# Patient Record
Sex: Female | Born: 1935
Health system: Southern US, Community
[De-identification: ages and names within clinical notes are randomized; demographics above are authoritative.]

## PROBLEM LIST (undated history)

## (undated) DIAGNOSIS — E785 Hyperlipidemia, unspecified: Secondary | ICD-10-CM

## (undated) DIAGNOSIS — J309 Allergic rhinitis, unspecified: Secondary | ICD-10-CM

## (undated) DIAGNOSIS — R252 Cramp and spasm: Secondary | ICD-10-CM

## (undated) DIAGNOSIS — M069 Rheumatoid arthritis, unspecified: Secondary | ICD-10-CM

## (undated) DIAGNOSIS — K219 Gastro-esophageal reflux disease without esophagitis: Secondary | ICD-10-CM

## (undated) DIAGNOSIS — N3941 Urge incontinence: Secondary | ICD-10-CM

## (undated) DIAGNOSIS — H04123 Dry eye syndrome of bilateral lacrimal glands: Secondary | ICD-10-CM

## (undated) DIAGNOSIS — H3530 Unspecified macular degeneration: Secondary | ICD-10-CM

## (undated) DIAGNOSIS — R7303 Prediabetes: Secondary | ICD-10-CM

## (undated) DIAGNOSIS — Z923 Personal history of irradiation: Secondary | ICD-10-CM

## (undated) DIAGNOSIS — M8580 Other specified disorders of bone density and structure, unspecified site: Secondary | ICD-10-CM

## (undated) DIAGNOSIS — R32 Unspecified urinary incontinence: Secondary | ICD-10-CM

## (undated) DIAGNOSIS — K5790 Diverticulosis of intestine, part unspecified, without perforation or abscess without bleeding: Secondary | ICD-10-CM

## (undated) DIAGNOSIS — M1990 Unspecified osteoarthritis, unspecified site: Secondary | ICD-10-CM

## (undated) DIAGNOSIS — N3281 Overactive bladder: Secondary | ICD-10-CM

## (undated) DIAGNOSIS — Z87898 Personal history of other specified conditions: Secondary | ICD-10-CM

## (undated) DIAGNOSIS — R5381 Other malaise: Secondary | ICD-10-CM

## (undated) DIAGNOSIS — I10 Essential (primary) hypertension: Secondary | ICD-10-CM

## (undated) DIAGNOSIS — C50919 Malignant neoplasm of unspecified site of unspecified female breast: Secondary | ICD-10-CM

## (undated) DIAGNOSIS — H9193 Unspecified hearing loss, bilateral: Secondary | ICD-10-CM

## (undated) DIAGNOSIS — H18511 Endothelial corneal dystrophy, right eye: Secondary | ICD-10-CM

## (undated) DIAGNOSIS — R9389 Abnormal findings on diagnostic imaging of other specified body structures: Secondary | ICD-10-CM

## (undated) DIAGNOSIS — R5383 Other fatigue: Secondary | ICD-10-CM

## (undated) DIAGNOSIS — K5730 Diverticulosis of large intestine without perforation or abscess without bleeding: Secondary | ICD-10-CM

## (undated) DIAGNOSIS — R040 Epistaxis: Secondary | ICD-10-CM

## (undated) DIAGNOSIS — N840 Polyp of corpus uteri: Secondary | ICD-10-CM

## (undated) DIAGNOSIS — D649 Anemia, unspecified: Secondary | ICD-10-CM

## (undated) DIAGNOSIS — Z973 Presence of spectacles and contact lenses: Secondary | ICD-10-CM

## (undated) DIAGNOSIS — S72009A Fracture of unspecified part of neck of unspecified femur, initial encounter for closed fracture: Secondary | ICD-10-CM

## (undated) HISTORY — DX: Rheumatoid arthritis, unspecified: M06.9

## (undated) HISTORY — PX: TONSILLECTOMY: SUR1361

## (undated) HISTORY — DX: Other malaise: R53.81

## (undated) HISTORY — DX: Hyperlipidemia, unspecified: E78.5

## (undated) HISTORY — DX: Diverticulosis of intestine, part unspecified, without perforation or abscess without bleeding: K57.90

## (undated) HISTORY — DX: Epistaxis: R04.0

## (undated) HISTORY — DX: Malignant neoplasm of unspecified site of unspecified female breast: C50.919

## (undated) HISTORY — DX: Other fatigue: R53.83

## (undated) HISTORY — DX: Unspecified urinary incontinence: R32

## (undated) HISTORY — DX: Cramp and spasm: R25.2

## (undated) HISTORY — DX: Gastro-esophageal reflux disease without esophagitis: K21.9

## (undated) HISTORY — DX: Essential (primary) hypertension: I10

## (undated) HISTORY — DX: Allergic rhinitis, unspecified: J30.9

## (undated) HISTORY — DX: Other specified disorders of bone density and structure, unspecified site: M85.80

## (undated) HISTORY — PX: MOLE REMOVAL: SHX2046

## (undated) HISTORY — DX: Fracture of unspecified part of neck of unspecified femur, initial encounter for closed fracture: S72.009A

---

## 1960-01-26 HISTORY — PX: TONSILLECTOMY: SUR1361

## 1997-08-09 ENCOUNTER — Other Ambulatory Visit: Admission: RE | Admit: 1997-08-09 | Discharge: 1997-08-09 | Payer: Self-pay | Admitting: *Deleted

## 1997-08-11 ENCOUNTER — Other Ambulatory Visit: Admission: RE | Admit: 1997-08-11 | Discharge: 1997-08-11 | Payer: Self-pay | Admitting: *Deleted

## 1998-08-19 ENCOUNTER — Other Ambulatory Visit: Admission: RE | Admit: 1998-08-19 | Discharge: 1998-08-19 | Payer: Self-pay | Admitting: *Deleted

## 1999-07-06 ENCOUNTER — Encounter: Admission: RE | Admit: 1999-07-06 | Discharge: 1999-07-06 | Payer: Self-pay | Admitting: *Deleted

## 1999-08-27 ENCOUNTER — Other Ambulatory Visit: Admission: RE | Admit: 1999-08-27 | Discharge: 1999-08-27 | Payer: Self-pay | Admitting: *Deleted

## 2000-09-01 ENCOUNTER — Other Ambulatory Visit: Admission: RE | Admit: 2000-09-01 | Discharge: 2000-09-01 | Payer: Self-pay | Admitting: *Deleted

## 2001-01-25 HISTORY — PX: COLONOSCOPY: SHX174

## 2001-09-05 ENCOUNTER — Other Ambulatory Visit: Admission: RE | Admit: 2001-09-05 | Discharge: 2001-09-05 | Payer: Self-pay | Admitting: *Deleted

## 2002-01-29 ENCOUNTER — Ambulatory Visit (HOSPITAL_COMMUNITY): Admission: RE | Admit: 2002-01-29 | Discharge: 2002-01-29 | Payer: Self-pay | Admitting: Gastroenterology

## 2002-09-11 ENCOUNTER — Other Ambulatory Visit: Admission: RE | Admit: 2002-09-11 | Discharge: 2002-09-11 | Payer: Self-pay | Admitting: *Deleted

## 2003-11-22 ENCOUNTER — Ambulatory Visit: Payer: Self-pay | Admitting: Family Medicine

## 2003-12-13 ENCOUNTER — Ambulatory Visit: Payer: Self-pay | Admitting: Family Medicine

## 2004-01-08 ENCOUNTER — Ambulatory Visit: Payer: Self-pay | Admitting: Family Medicine

## 2004-01-26 HISTORY — PX: CYSTOSCOPY: SUR368

## 2004-02-14 ENCOUNTER — Ambulatory Visit: Payer: Self-pay | Admitting: Family Medicine

## 2004-03-16 ENCOUNTER — Ambulatory Visit: Payer: Self-pay | Admitting: Family Medicine

## 2004-05-25 ENCOUNTER — Ambulatory Visit: Payer: Self-pay | Admitting: Family Medicine

## 2004-07-21 ENCOUNTER — Ambulatory Visit: Payer: Self-pay | Admitting: Family Medicine

## 2004-10-21 ENCOUNTER — Ambulatory Visit: Payer: Self-pay | Admitting: Family Medicine

## 2005-01-27 ENCOUNTER — Ambulatory Visit: Payer: Self-pay | Admitting: Family Medicine

## 2005-02-26 ENCOUNTER — Ambulatory Visit: Payer: Self-pay | Admitting: Family Medicine

## 2005-03-18 ENCOUNTER — Ambulatory Visit: Payer: Self-pay | Admitting: Family Medicine

## 2005-05-27 ENCOUNTER — Ambulatory Visit: Payer: Self-pay | Admitting: Family Medicine

## 2006-01-25 HISTORY — PX: CATARACT EXTRACTION W/ INTRAOCULAR LENS IMPLANT: SHX1309

## 2006-04-21 ENCOUNTER — Ambulatory Visit: Payer: Self-pay | Admitting: Family Medicine

## 2006-10-18 ENCOUNTER — Encounter: Payer: Self-pay | Admitting: Family Medicine

## 2006-11-07 ENCOUNTER — Encounter (INDEPENDENT_AMBULATORY_CARE_PROVIDER_SITE_OTHER): Payer: Self-pay | Admitting: *Deleted

## 2006-12-26 ENCOUNTER — Ambulatory Visit: Payer: Self-pay | Admitting: *Deleted

## 2006-12-26 DIAGNOSIS — I10 Essential (primary) hypertension: Secondary | ICD-10-CM | POA: Insufficient documentation

## 2006-12-26 DIAGNOSIS — K219 Gastro-esophageal reflux disease without esophagitis: Secondary | ICD-10-CM

## 2006-12-26 DIAGNOSIS — M069 Rheumatoid arthritis, unspecified: Secondary | ICD-10-CM | POA: Insufficient documentation

## 2006-12-26 DIAGNOSIS — M810 Age-related osteoporosis without current pathological fracture: Secondary | ICD-10-CM

## 2006-12-26 DIAGNOSIS — R32 Unspecified urinary incontinence: Secondary | ICD-10-CM

## 2007-05-08 ENCOUNTER — Encounter: Payer: Self-pay | Admitting: Family Medicine

## 2007-06-14 ENCOUNTER — Encounter: Payer: Self-pay | Admitting: Family Medicine

## 2007-08-08 ENCOUNTER — Encounter: Payer: Self-pay | Admitting: Family Medicine

## 2007-08-08 ENCOUNTER — Telehealth: Payer: Self-pay | Admitting: Family Medicine

## 2007-08-09 ENCOUNTER — Telehealth (INDEPENDENT_AMBULATORY_CARE_PROVIDER_SITE_OTHER): Payer: Self-pay | Admitting: *Deleted

## 2008-03-29 ENCOUNTER — Ambulatory Visit: Payer: Self-pay | Admitting: Family Medicine

## 2008-03-29 DIAGNOSIS — J309 Allergic rhinitis, unspecified: Secondary | ICD-10-CM | POA: Insufficient documentation

## 2008-04-02 LAB — CONVERTED CEMR LAB
ALT: 21 units/L (ref 0–35)
AST: 19 units/L (ref 0–37)
Albumin: 4.3 g/dL (ref 3.5–5.2)
Alkaline Phosphatase: 65 units/L (ref 39–117)
BUN: 10 mg/dL (ref 6–23)
Basophils Absolute: 0 10*3/uL (ref 0.0–0.1)
Basophils Relative: 0.6 % (ref 0.0–3.0)
Bilirubin, Direct: 0.1 mg/dL (ref 0.0–0.3)
CO2: 32 meq/L (ref 19–32)
Calcium: 9.8 mg/dL (ref 8.4–10.5)
Chloride: 100 meq/L (ref 96–112)
Cholesterol: 227 mg/dL (ref 0–200)
Creatinine, Ser: 0.7 mg/dL (ref 0.4–1.2)
Direct LDL: 144.3 mg/dL
Eosinophils Absolute: 0.1 10*3/uL (ref 0.0–0.7)
Eosinophils Relative: 1.4 % (ref 0.0–5.0)
GFR calc Af Amer: 106 mL/min
GFR calc non Af Amer: 87 mL/min
Glucose, Bld: 95 mg/dL (ref 70–99)
HCT: 40.5 % (ref 36.0–46.0)
HDL: 42.3 mg/dL (ref >39.0)
Hemoglobin: 13.6 g/dL (ref 12.0–15.0)
Lymphocytes Relative: 37.6 % (ref 12.0–46.0)
MCHC: 33.5 g/dL (ref 30.0–36.0)
MCV: 85.1 fL (ref 78.0–100.0)
Monocytes Absolute: 0.3 10*3/uL (ref 0.1–1.0)
Monocytes Relative: 5.9 % (ref 3.0–12.0)
Neutro Abs: 3 10*3/uL (ref 1.4–7.7)
Neutrophils Relative %: 54.5 % (ref 43.0–77.0)
Phosphorus: 4.3 mg/dL (ref 2.3–4.6)
Platelets: 252 10*3/uL (ref 150–400)
Potassium: 3.6 meq/L (ref 3.5–5.1)
RBC: 4.76 M/uL (ref 3.87–5.11)
RDW: 13 % (ref 11.5–14.6)
Sodium: 140 meq/L (ref 135–145)
TSH: 1.64 microintl units/mL (ref 0.35–5.50)
Total Bilirubin: 1 mg/dL (ref 0.3–1.2)
Total CHOL/HDL Ratio: 5.4
Total Protein: 7.2 g/dL (ref 6.0–8.3)
Triglycerides: 132 mg/dL (ref 0–149)
VLDL: 26 mg/dL (ref 0–40)
WBC: 5.5 10*3/uL (ref 4.5–10.5)

## 2008-05-22 ENCOUNTER — Telehealth: Payer: Self-pay | Admitting: Family Medicine

## 2008-06-11 ENCOUNTER — Ambulatory Visit: Payer: Self-pay | Admitting: Family Medicine

## 2008-06-11 DIAGNOSIS — E785 Hyperlipidemia, unspecified: Secondary | ICD-10-CM

## 2008-06-11 LAB — CONVERTED CEMR LAB
Cholesterol, target level: 200 mg/dL
HDL goal, serum: 40 mg/dL
LDL Goal: 130 mg/dL

## 2008-06-12 ENCOUNTER — Telehealth (INDEPENDENT_AMBULATORY_CARE_PROVIDER_SITE_OTHER): Payer: Self-pay | Admitting: *Deleted

## 2008-07-31 ENCOUNTER — Telehealth: Payer: Self-pay | Admitting: Family Medicine

## 2008-07-31 ENCOUNTER — Encounter: Payer: Self-pay | Admitting: Family Medicine

## 2009-01-25 HISTORY — PX: BREAST BIOPSY: SHX20

## 2009-02-25 HISTORY — PX: BREAST BIOPSY: SHX20

## 2009-03-10 ENCOUNTER — Encounter: Payer: Self-pay | Admitting: Family Medicine

## 2009-03-11 ENCOUNTER — Encounter: Payer: Self-pay | Admitting: Family Medicine

## 2009-03-13 ENCOUNTER — Telehealth: Payer: Self-pay | Admitting: Family Medicine

## 2009-03-17 ENCOUNTER — Encounter: Payer: Self-pay | Admitting: Family Medicine

## 2009-04-28 ENCOUNTER — Telehealth: Payer: Self-pay | Admitting: Family Medicine

## 2009-05-27 ENCOUNTER — Ambulatory Visit: Payer: Self-pay | Admitting: Family Medicine

## 2009-05-27 DIAGNOSIS — R252 Cramp and spasm: Secondary | ICD-10-CM | POA: Insufficient documentation

## 2009-05-27 DIAGNOSIS — R5383 Other fatigue: Secondary | ICD-10-CM

## 2009-05-27 DIAGNOSIS — R5381 Other malaise: Secondary | ICD-10-CM

## 2009-05-27 DIAGNOSIS — R209 Unspecified disturbances of skin sensation: Secondary | ICD-10-CM

## 2009-05-28 LAB — CONVERTED CEMR LAB
ALT: 17 units/L (ref 0–35)
AST: 19 units/L (ref 0–37)
Albumin: 4.2 g/dL (ref 3.5–5.2)
BUN: 11 mg/dL (ref 6–23)
Basophils Absolute: 0.1 10*3/uL (ref 0.0–0.1)
Basophils Relative: 0.8 % (ref 0.0–3.0)
CO2: 32 meq/L (ref 19–32)
Calcium: 9.7 mg/dL (ref 8.4–10.5)
Chloride: 102 meq/L (ref 96–112)
Cholesterol: 203 mg/dL — ABNORMAL HIGH (ref 0–200)
Creatinine, Ser: 0.7 mg/dL (ref 0.4–1.2)
Direct LDL: 121.7 mg/dL
Eosinophils Absolute: 0.1 10*3/uL (ref 0.0–0.7)
Eosinophils Relative: 1.7 % (ref 0.0–5.0)
Folate: >20 ng/mL
GFR calc non Af Amer: 87.08 mL/min (ref >60)
Glucose, Bld: 89 mg/dL (ref 70–99)
HCT: 38.7 % (ref 36.0–46.0)
HDL: 42.3 mg/dL (ref >39.00)
Hemoglobin: 13.1 g/dL (ref 12.0–15.0)
Lymphocytes Relative: 34.6 % (ref 12.0–46.0)
Lymphs Abs: 2 10*3/uL (ref 0.7–4.0)
MCHC: 33.9 g/dL (ref 30.0–36.0)
MCV: 85.5 fL (ref 78.0–100.0)
Monocytes Absolute: 0.3 10*3/uL (ref 0.1–1.0)
Monocytes Relative: 5.5 % (ref 3.0–12.0)
Neutro Abs: 3.4 10*3/uL (ref 1.4–7.7)
Neutrophils Relative %: 57.4 % (ref 43.0–77.0)
Phosphorus: 4.2 mg/dL (ref 2.3–4.6)
Platelets: 242 10*3/uL (ref 150.0–400.0)
Potassium: 4 meq/L (ref 3.5–5.1)
RBC: 4.53 M/uL (ref 3.87–5.11)
RDW: 13.6 % (ref 11.5–14.6)
Sed Rate: 10 mm/hr (ref 0–22)
Sodium: 142 meq/L (ref 135–145)
TSH: 1.28 microintl units/mL (ref 0.35–5.50)
Total CHOL/HDL Ratio: 5
Triglycerides: 143 mg/dL (ref 0.0–149.0)
VLDL: 28.6 mg/dL (ref 0.0–40.0)
Vit D, 25-Hydroxy: 56 ng/mL (ref 30–89)
Vitamin B-12: 901 pg/mL (ref 211–911)
WBC: 5.9 10*3/uL (ref 4.5–10.5)

## 2009-05-29 ENCOUNTER — Encounter: Admission: RE | Admit: 2009-05-29 | Discharge: 2009-05-29 | Payer: Self-pay | Admitting: Family Medicine

## 2009-06-26 ENCOUNTER — Encounter: Payer: Self-pay | Admitting: Family Medicine

## 2009-07-07 ENCOUNTER — Encounter: Payer: Self-pay | Admitting: Family Medicine

## 2009-07-16 ENCOUNTER — Encounter: Payer: Self-pay | Admitting: Family Medicine

## 2009-07-30 ENCOUNTER — Ambulatory Visit: Payer: Self-pay | Admitting: Family Medicine

## 2009-08-06 ENCOUNTER — Encounter: Admission: RE | Admit: 2009-08-06 | Discharge: 2009-08-06 | Payer: Self-pay | Admitting: Diagnostic Neuroimaging

## 2009-08-06 ENCOUNTER — Encounter: Payer: Self-pay | Admitting: Family Medicine

## 2009-08-07 ENCOUNTER — Ambulatory Visit: Payer: Self-pay | Admitting: Family Medicine

## 2009-08-25 HISTORY — PX: BREAST BIOPSY: SHX20

## 2009-09-18 ENCOUNTER — Encounter: Payer: Self-pay | Admitting: Family Medicine

## 2009-09-30 ENCOUNTER — Encounter: Payer: Self-pay | Admitting: Family Medicine

## 2010-02-04 ENCOUNTER — Encounter: Payer: Self-pay | Admitting: Family Medicine

## 2010-02-26 NOTE — Consult Note (Signed)
Summary: Dr.Vikram Penumalli,[facility]  Dr.Vikram Penumalli,[facility]   Imported By: Beau Fanny 08/13/2009 16:22:42  _____________________________________________________________________  External Attachment:    Type:   Image     Comment:   External Document

## 2010-02-26 NOTE — Assessment & Plan Note (Signed)
Summary: PROBLEMS WITH LEGS/RBH    Vital Signs:  Patient profile:   77 year old female Height:      64.5 inches Weight:      148.25 pounds BMI:     25.14 Temp:     98.3 degrees F oral Pulse rate:   80 / minute Pulse rhythm:   regular BP sitting:   132 / 84  (left arm) Cuff size:   regular  Vitals Entered By: Lewanda Rife LPN (May 28, 5782 9:37 AM) CC: cramps in lower legs at night and numbness in rt leg on and off   History of Present Illness: leg problems for about 2 weeks woke up one night - bad cramp in  Rleg -- put her leg on the floor -- then got a little numb that passed quickly  saturday -- jumped up because she felt a cramp coming - then could not move her R lleg and then numbness again  she rubbed it and it got back to normal -- then could walk around   today leg is back to normal     has always had numb feet intermittently- never got it checked out  last year - walking in mall - fell foward unexpectedly and got right up after that gait was never quite the same --? wondered why  april of last year in bed one night and got up -- could not move her L leg -- and L foot felt numb foot - could barely put wt on   fam hx - PGM had a cva  PGF with cva  no MS in family    Allergies: 1)  ! Fosamax 2)  Ace Inhibitors 3)  * Boniva  Past History:  Past Medical History: Last updated: 03/29/2008 GERD Hypertension Osteopenia Rheumatoid arthritis Urinary incontinence Epistaxis   GI - Buccini rheum- Devishwar  gyn-- Dr Dixdine   Past Surgical History: Last updated: 03/19/2009 Tonsillectomy Cystoscopy- 2006 Colonoscopy- 2003 Mole removed from rectum 5/09 colonoscopy - diverticulosis - re check 5 years mam with breast US - normal 08/17/2007 dexa 2009- osteopenia  2/11-- breast bx fibrocystic change  2/07- nl lifeline screen - carotid, AAA, PVD  Family History: Last updated: January 02, 2007 Father died age 73 of MI, CAD Mother still living- arrythmia, CAD,  HTN,anxiety Family hx of CV, HBP, Depression and second cousin with breast cancer.  Social History: Last updated: 03/29/2008 Married No biological children, has step-children. Taught HS x 30 years. non smoker  regular exercise   Risk Factors: Exercise: no (01/02/07)  Risk Factors: Smoking Status: never (01-02-2007) Passive Smoke Exposure: no (01-02-07)  Review of Systems General:  Complains of fatigue; denies chills, fever, loss of appetite, and malaise. Eyes:  Complains of blurring; occ blurred vision but not double . ENT:  Denies sinus pressure and sore throat. CV:  Denies chest pain or discomfort and palpitations. Resp:  Denies cough, pleuritic, shortness of breath, and wheezing. GI:  Denies abdominal pain, bloody stools, change in bowel habits, loss of appetite, nausea, and vomiting. GU:  Denies dysuria and urinary frequency. MS:  Complains of muscle aches, cramps, muscle weakness, and stiffness; denies joint redness and joint swelling. Derm:  Denies lesion(s), poor wound healing, and rash. Neuro:  Complains of numbness, tingling, visual disturbances, and weakness; denies headaches, inability to speak, memory loss, poor balance, seizures, and tremors. Psych:  Denies anxiety and depression. Endo:  Denies cold intolerance, excessive thirst, excessive urination, and heat intolerance. Heme:  Denies abnormal bruising and  bleeding.  Physical Exam  General:  Well-developed,well-nourished,in no acute distress; alert,appropriate and cooperative throughout examination Head:  normocephalic, atraumatic, and no abnormalities observed.  no sinus tenderness  Eyes:  vision grossly intact, pupils equal, pupils round, and pupils reactive to light.  fundi grossly wnl  Ears:  R ear normal and L ear normal.   Nose:  no nasal discharge.   Mouth:  pharynx pink and moist.   Neck:  supple with full rom and no masses or thyromegally, no JVD or carotid bruit  Chest Wall:  No deformities,  masses, or tenderness noted. Lungs:  Normal respiratory effort, chest expands symmetrically. Lungs are clear to auscultation, no crackles or wheezes. Heart:  normal rate and regular rhythm.  , no M or gallop Abdomen:  Bowel sounds positive,abdomen soft and non-tender without masses, organomegaly or hernias noted. no renal bruits  Msk:  No deformity or scoliosis noted of thoracic or lumbar spine.  no acute joint changes  Pulses:  plus one pedal pulses Extremities:  No clubbing, cyanosis, edema, or deformity noted with normal full range of motion of all joints.   Neurologic:  alert & oriented X3, cranial nerves II-XII intact, strength normal in all extremities, sensation intact to light touch, gait normal, DTRs symmetrical and normal, finger-to-nose normal, toes down bilaterally on Babinski, and Romberg negative.   Skin:  Intact without suspicious lesions or rashes Cervical Nodes:  No lymphadenopathy noted Inguinal Nodes:  No significant adenopathy Psych:  normal affect, talkative and pleasant    Impression & Recommendations:  Problem # 1:  NUMBNESS (ICD-782.0) Assessment New of leg intermittently with weakness  ? if poss MS or metabolic disorder or tia check mri brain with contrast- then make plan adv if symptoms suddenly return or persist to go to ER labs  Orders: TLB-TSH (Thyroid Stimulating Hormone) (84443-TSH) TLB-AST (SGOT) (84450-SGOT) TLB-ALT (SGPT) (84460-ALT) TLB-B12 + Folate Pnl (82746_82607-B12/FOL) T-Vitamin D (25-Hydroxy) (09811-91478) TLB-CBC Platelet - w/Differential (85025-CBCD) TLB-Sedimentation Rate (ESR) (85652-ESR) Radiology Referral (Radiology)  Problem # 2:  WEAKNESS (ICD-780.79) Assessment: New see above  Orders: TLB-Sedimentation Rate (ESR) (85652-ESR) Radiology Referral (Radiology)  Problem # 3:  LEG CRAMPS (ICD-729.82) check labs ? if rel to above - MRI ordered  Orders: TLB-TSH (Thyroid Stimulating Hormone) (84443-TSH) TLB-AST (SGOT)  (84450-SGOT) TLB-ALT (SGPT) (84460-ALT) TLB-B12 + Folate Pnl (82746_82607-B12/FOL) T-Vitamin D (25-Hydroxy) (29562-13086) TLB-CBC Platelet - w/Differential (85025-CBCD) TLB-Sedimentation Rate (ESR) (85652-ESR) Radiology Referral (Radiology)  Problem # 4:  FATIGUE (ICD-780.79) ? if rel to above lab today f/u in 2 mo  Orders: TLB-TSH (Thyroid Stimulating Hormone) (84443-TSH) TLB-AST (SGOT) (84450-SGOT) TLB-ALT (SGPT) (84460-ALT) TLB-B12 + Folate Pnl (57846_96295-M84/XLK) T-Vitamin D (25-Hydroxy) (44010-27253) TLB-CBC Platelet - w/Differential (85025-CBCD) TLB-Sedimentation Rate (ESR) (85652-ESR)  Complete Medication List: 1)  Fexofenadine Hcl 180 Mg Tabs (Fexofenadine hcl) .... One by mouth once daily 2)  Nasonex 50 Mcg/act Susp (Mometasone furoate) .... Two sprays in each nostril once a day as needed 3)  Hyzaar 50-12.5 Mg Tabs (Losartan potassium-hctz) .... 1/2 by mouth once daily 4)  Mucinex Maximum Strength 1200 Mg Xr12h-tab (Guaifenesin) .... As needed 5)  Proair Hfa 108 (90 Base) Mcg/act Aers (Albuterol sulfate) .... 2 puffs up to every 4 hours as needed wheezing/tight chest 6)  Tessalon 200 Mg Caps (Benzonatate) .Marland Kitchen.. 1 by mouth up to three times a day as needed cough 7)  Prilosec Otc 20 Mg Tbec (Omeprazole magnesium) .... Otc as directed.  Other Orders: Venipuncture (66440) TLB-Lipid Panel (80061-LIPID) TLB-Renal Function Panel (80069-RENAL) Specimen Handling (34742)  Patient Instructions: 1)  we will do labs today and set up MRI at check out  2)  in meantime if worse - let me know  3)  follow up with me in 2 months to disc labs   Current Allergies (reviewed today): ! FOSAMAX ACE INHIBITORS * BONIVA

## 2010-02-26 NOTE — Progress Notes (Signed)
Summary: Breast biopsy  Phone Note Outgoing Call   Call placed by: Lewanda Rife Call placed to: Patient Summary of Call: In reference to add'l mammogram views, pt went back on 03/11/09 for add'l mammogram views and ultrasound of breast. Pt is scheduled for breast biopsy on 03/17/09 by Dr Samuella Cota at [facility]. Pt appreciated your concern. This is just FYI. Thank you. Initial call taken by: Lewanda Rife LPN,  March 13, 2009 8:07 AM

## 2010-02-26 NOTE — Progress Notes (Signed)
Summary: Losartan HCTZ 50/12.5mg  refill  Phone Note Refill Request Call back at [phone] Message from:  [facility] on April 28, 2009 4:04 PM  Refills Requested: Medication #1:  HYZAAR 50-12.5 MG TABS 1/2 by mouth once daily [facility] sent refill request for Losartan HCTZ 50-12.5mg  with instructions to take one daily. Our records indicate on 06/11/2008 pt was to take 1/2 pill daily. I spoke with pt and she takes 1/2 pill daily. Pt said BP usually is 120/68 and she feels fine, no dizziness. Please advise.    Method Requested: Telephone to Pharmacy Initial call taken by: Lewanda Rife LPN,  April 28, 2009 4:09 PM  Follow-up for Phone Call        please change pharmacy directions to 1/2 - that is what she should be on can call in 1 mo with 11 ref if needed  Follow-up by: Judith Part MD,  April 28, 2009 4:15 PM  Additional Follow-up for Phone Call Additional follow up Details #1::        [facility] as instructed. New directions taking 1/2 by mouth once daily was given to pharmacist.Rena [facility] LPN  April 28, 1605 5:31 PM

## 2010-02-26 NOTE — Assessment & Plan Note (Signed)
Summary: ACU FOR SEVERE HEADACHES/JRR   Vital Signs:  Patient profile:   77 year old female Height:      64.5 inches Weight:      148 pounds BMI:     25.10 Temp:     98.4 degrees F oral Pulse rate:   92 / minute Pulse rhythm:   regular BP sitting:   128 / 70  (left arm) Cuff size:   regular  Vitals Entered By: Delilah Shan CMA Duncan Dull) (August 07, 2009 10:20 AM) CC: Nagging headache x 2 weeks, worse for the past few days.  N & V over the weekend, some night sweats.  Patient complaining with "slimey" drainage down her throat.   History of Present Illness: HA- h/o sinus pain for "years" but this episode is not responding to sudafed fully like it typically does.  This episode has been going on for several weeks.  "Aching" across bridge of nose and across face and forehead.  No F/C.  1 episode of vomiting/diarrhea last week.  Post nasal gtt per patient.  Occ cough, dry.  H/o allergy trouble.  Had been on allegra; not taking while using pseudophed.  Out of nasonex.  Had used it as needed before.    Allergies: 1)  ! Fosamax 2)  Ace Inhibitors 3)  * Boniva  Review of Systems       See HPI.  Otherwise noncontributory.    Physical Exam  General:  GEN: nad, alert and oriented HEENT: mucous membranes moist, TM w/o erythema but partially obscured by cerumen, nasal epithelium injected, OP with cobblestoning, max sinus slight tender to palpation on R NECK: supple w/o LA CV: rrr. PULM: ctab, no inc wob ABD: soft, +bs EXT: no edema    Impression & Recommendations:  Problem # 1:  ALLERGIC RHINITIS (ICD-477.9) Nontoxic.  Can likely tolerate nasal steroid.  Epistaxis unlikely with brief use.  Restart nasal steroid and use allegra.  Wean off oral OTC agents with decongestants and then off nasonex.  Benign exam o/w w/o fever.  I see no need for antibiotics at this point.  follow up as needed.  She understood.  Her updated medication list for this problem includes:    Fexofenadine Hcl 180 Mg  Tabs (Fexofenadine hcl) ..... One by mouth once daily as needed    Nasonex 50 Mcg/act Susp (Mometasone furoate) .Marland Kitchen..Marland Kitchen Two sprays in each nostril once a day as needed  Complete Medication List: 1)  Fexofenadine Hcl 180 Mg Tabs (Fexofenadine hcl) .... One by mouth once daily as needed 2)  Nasonex 50 Mcg/act Susp (Mometasone furoate) .... Two sprays in each nostril once a day as needed 3)  Hyzaar 50-12.5 Mg Tabs (Losartan potassium-hctz) .... 1/2 by mouth once daily 4)  Mucinex Maximum Strength 1200 Mg Xr12h-tab (Guaifenesin) .... As needed 5)  Prilosec Otc 20 Mg Tbec (Omeprazole magnesium) .... Otc 2 by mouth once daily 6)  Qvc Vitamins  .... Once daily  Patient Instructions: 1)  Please schedule a follow-up appointment as needed .  Use the allegra along with the nasonex and gradually wean off the pseudophed.  Take care.  Prescriptions: NASONEX 50 MCG/ACT  SUSP (MOMETASONE FUROATE) Two sprays in each nostril once a day as needed  #1 x 5   Entered and Authorized by:   Crawford Givens MD   Signed by:   Crawford Givens MD on 08/07/2009   Method used:   [facility]        [facility] [phone][facility] (retail)       [address] [place], [place]  [address]       Ph: [phone] or [phone]       Fax: [phone]   RxID:   [id number]   Current Allergies (reviewed today): ! FOSAMAX ACE INHIBITORS * BONIVA

## 2010-02-26 NOTE — Assessment & Plan Note (Signed)
Summary: 2 M F/U DISCUSS LABS/DLO   Vital Signs:  Patient profile:   77 year old female Height:      64.5 inches Weight:      148 pounds BMI:     25.10 Temp:     97.9 degrees F oral Pulse rate:   80 / minute Pulse rhythm:   regular BP sitting:   124 / 66  (left arm) Cuff size:   regular  Vitals Entered By: Lewanda Rife LPN (July 30, 1608 9:31 AM) CC: 2 month f/u to discuss labs, Lipid Management   History of Present Illness: here for f/u of visit in may with leg numbness/ pain/ cramps   Mri came back nl and was ref to neurol for symptoms  per pt - did not come up with anything  did nerve conduction tests and also carotid doppler  thinks all was ok  may be getting mra - but medicare has not approved it   neurologist - did talk to her about poss tiny stroke when she fell - wants to investigate further   symptoms are a little better less cramping if she sleeps on side instead of back  still a little numb in L foot- but better  did go to chiropractor -- and adj may have helped as well    at that time nl labs incl B12 and cbc and esr and vit D chol had also imp with LDL 121  wt is stable  bp good   some stress- ? if anxiety   indigestion in the past  was on nexium but ins did not cover  is on 20 of prilosec -- then has breakthrough symptoms     Lipid Management History:      Positive NCEP/ATP III risk factors include female age 16 years old or older and hypertension.  Negative NCEP/ATP III risk factors include no history of early menopause without estrogen hormone replacement, non-diabetic, non-tobacco-user status, no ASHD (atherosclerotic heart disease), no prior stroke/TIA, no peripheral vascular disease, and no history of aortic aneurysm.     Allergies: 1)  ! Fosamax 2)  Ace Inhibitors 3)  * Boniva  Past History:  Past Medical History: Last updated: 03/29/2008 GERD Hypertension Osteopenia Rheumatoid arthritis Urinary incontinence Epistaxis   GI -  Buccini rheum- Devishwar  gyn-- Dr Dixdine   Family History: Last updated: 2006/12/29 Father died age 37 of MI, CAD Mother still living- arrythmia, CAD, HTN,anxiety Family hx of CV, HBP, Depression and second cousin with breast cancer.  Social History: Last updated: 03/29/2008 Married No biological children, has step-children. 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 Eyes:  Denies blurring and eye irritation. CV:  Denies chest pain or discomfort, lightheadness, and palpitations. Resp:  Denies cough, shortness of breath, and wheezing. GI:  Complains of indigestion; denies abdominal pain, nausea, and vomiting. GU:  Denies urinary frequency. MS:  Complains of cramps; denies joint pain, joint redness, and joint swelling. Derm:  Denies itching, lesion(s), poor wound healing,  and rash. Neuro:  Complains of numbness and tingling. Endo:  Denies cold intolerance, excessive thirst, excessive urination, and heat intolerance. Heme:  Denies abnormal bruising and bleeding.  Physical Exam  General:  Well-developed,well-nourished,in no acute distress; alert,appropriate and cooperative throughout examination Head:  normocephalic, atraumatic, and no abnormalities observed.   Eyes:  vision grossly intact, pupils equal, pupils round, and pupils reactive to light.  no conjunctival pallor, injection or icterus  Mouth:  pharynx pink and moist.   Neck:  supple with full rom  and no masses or thyromegally, no JVD or carotid bruit  Chest Wall:  No deformities, masses, or tenderness noted. Lungs:  Normal respiratory effort, chest expands symmetrically. Lungs are clear to auscultation, no crackles or wheezes. Heart:  normal rate and regular rhythm.  , no M or gallop Abdomen:  mild epigastric tenderness without rebound or gaurding  soft, non-tender, normal bowel sounds, no hepatomegaly, and no splenomegaly.   Msk:  No deformity or scoliosis noted of thoracic or lumbar spine.   Pulses:  R and L carotid,radial,femoral,dorsalis pedis and posterior tibial pulses are full and equal bilaterally Extremities:  No clubbing, cyanosis, edema, or deformity noted with normal full range of motion of all joints.   Neurologic:  sensation intact to light touch, gait normal, and DTRs symmetrical and normal.   no acute changes on exam today Skin:  Intact without suspicious lesions or rashes Inguinal Nodes:  No significant adenopathy Psych:  normal affect, talkative and pleasant    Impression & Recommendations:  Problem # 1:  NUMBNESS (ICD-782.0) Assessment Improved ongoing workup by neurol - neg mri and carotids and ncv --  considering mra  still sympt but imp after chiropractic adj  will keep me updated  Problem # 2:  LEG CRAMPS (ICD-729.82) Assessment: Improved better not sleeping on back continue neuro w/u  Problem # 3:  GERD (ICD-530.81) Assessment: Deteriorated unfortunately cannot afford nexim  will inc prilosec permanently to 40 mg and update if any breakthrough (with good diet) Her updated medication list for this problem includes:    Prilosec Otc 20 Mg Tbec (Omeprazole magnesium) ..... Otc 2 by mouth once daily  Complete Medication List: 1)  Fexofenadine Hcl 180 Mg Tabs (Fexofenadine hcl) .... One by mouth once daily as needed 2)  Nasonex 50 Mcg/act Susp (Mometasone furoate) .... Two sprays in each nostril once a day as needed 3)  Hyzaar 50-12.5 Mg Tabs (Losartan  potassium-hctz) .... 1/2 by mouth once daily 4)  Mucinex Maximum Strength 1200 Mg Xr12h-tab (Guaifenesin) .... As needed 5)  Proair Hfa 108 (90 Base) Mcg/act Aers (Albuterol sulfate) .... 2 puffs up to every 4 hours as needed wheezing/tight chest 6)  Prilosec Otc 20 Mg Tbec (Omeprazole magnesium) .... Otc 2 by mouth once daily  Lipid Assessment/Plan:      Based on NCEP/ATP III, the patient's risk factor category is "0-1 risk factors".  The patient's lipid goals are as follows: Total cholesterol goal is 200; LDL cholesterol goal is 130; HDL cholesterol goal is 40; Triglyceride goal is 150.    Patient Instructions: 1)  continue follow up and testing from neurology  2)  update me if symptoms worsen  3)  labs are ok 4)  keep watching diet for cholesterol  5)  increase your prilosec to total 40 mg daily (2 pills)  6)  watch diet for spicy and fatty foods   Current Allergies (reviewed today): ! FOSAMAX ACE INHIBITORS * BONIVA

## 2010-02-26 NOTE — Letter (Signed)
Summary: Patient Med & Problem List/[facility]  Patient Med & Problem List/[facility]   Imported By: Lanelle Bal 07/03/2009 10:35:22  _____________________________________________________________________  External Attachment:    Type:   Image     Comment:   External Document

## 2010-03-16 ENCOUNTER — Encounter: Payer: Self-pay | Admitting: Family Medicine

## 2010-03-16 LAB — HM MAMMOGRAPHY: HM Mammogram: NORMAL

## 2010-03-18 ENCOUNTER — Encounter: Payer: Self-pay | Admitting: Family Medicine

## 2010-03-18 ENCOUNTER — Encounter (INDEPENDENT_AMBULATORY_CARE_PROVIDER_SITE_OTHER): Payer: Self-pay | Admitting: *Deleted

## 2010-03-24 NOTE — Letter (Signed)
Summary: Results Follow up Letter  [facility]  [address] [place], [place] [address]   Phone: [phone]  Fax: [phone]    03/18/2010 MRN: [id number]    [facility] [address] [place], [place]  [address]    Dear Ms. Daughdrill,  The following are the results of your recent test(s):  Test         Result    Pap Smear:        Normal _____  Not Normal _____ Comments: ______________________________________________________ Cholesterol: LDL(Bad cholesterol):         Your goal is less than:         HDL (Good cholesterol):       Your goal is more than: Comments:  ______________________________________________________ Mammogram:        Normal __X___  Not Normal _____ Comments:  Yearly follow up is recommended.   ___________________________________________________________________ Hemoccult:        Normal _____  Not normal _______ Comments:    _____________________________________________________________________ Other Tests:    We routinely do not discuss normal results over the telephone.  If you desire a copy of the results, or you have any questions about this information we can discuss them at your next office visit.   Sincerely,    Marne A. Milinda Antis, M.D.  MAT:lsf

## 2010-03-24 NOTE — Miscellaneous (Signed)
  Clinical Lists Changes  Observations: Added new observation of MAMMO DUE: 03/17/2011 (03/16/2010 8:55) Added new observation of MAMMOGRAM: Normal (03/16/2010 8:55)

## 2010-05-21 ENCOUNTER — Ambulatory Visit: Payer: Self-pay | Admitting: Unknown Physician Specialty

## 2010-06-01 ENCOUNTER — Other Ambulatory Visit: Payer: Self-pay | Admitting: Family Medicine

## 2010-06-01 NOTE — Telephone Encounter (Signed)
Pt needs to call for appt. 

## 2010-06-12 NOTE — Op Note (Signed)
   NAMERYANA, Sierra Bentley                           ACCOUNT NO.:  [id number]   MEDICAL RECORD NO.:  [id number]                   PATIENT TYPE:  AMB   LOCATION:  ENDO                                 FACILITY:  [facility]   PHYSICIAN:  Bernette Redbird, M.D.                DATE OF BIRTH:  [birth year]   DATE OF PROCEDURE:  01/29/2002  DATE OF DISCHARGE:                                 OPERATIVE REPORT   PROCEDURE PERFORMED:  Upper endoscopy.   INDICATIONS FOR PROCEDURE:  A 77 year old female with transiently hemoccult  positive stool through the office of her gynecologist on home Hemoccult  testing.  Subsequent hemoccults through my office were negative.   FINDINGS:  Possible minimal distal esophagitis.  Otherwise normal.   CONSENT:  The nature, purpose and risks of the procedure had been discussed  with the patient who provided written consent.   SEDATION:  Fentanyl 50 mcg and Versed 5 mg IV without arrhythmias or  desaturations.   DESCRIPTION OF PROCEDURE:  The Olympus video endoscope was passed under  direct vision.  The vocal cords looked normal.  The esophagus was readily  entered and was normal down to the GE junction where there was perhaps some  slight irregularity of the Z line and/or minimal esophagitis but really  nothing I could definitively call.  No significant reflux esophagitis,  extensive Barrett's esophagus, free reflux, varices, infection, neoplasia,  ring, stricture or significant hiatal hernia was identified.  The stomach  contained a small clear residual and had normal mucosa without evidence of  gastritis, erosions, ulcers, polyps or masses including retroflex view of  the cardia, and the pylorus, duodenal bulb and second duodenum looked  normal.  No biopsies were obtained.  The duodenal bulb was carefully  inspected a couple of times.  In fact, I think all areas of the upper tract  were reinspected and again with no significant findings.  The scope was then  removed  from the patient.  She tolerated the procedure well and there were  no apparent complications.  No biopsies were obtained.    IMPRESSION:  Essentially normal exam.  No source of transient hemoccult  positivity identified.   PLAN:  Proceed to colonoscopic evaluation.                                               Bernette Redbird, M.D.    RB/MEDQ  D:  01/29/2002  T:  01/29/2002  Job:  [id number]   cc:   Pershing Cox, M.D.  [address]  [place]  [place] [address]  Fax: [phone]   Julieanne Manson  [address]  [place]  [place] [address]  Fax: [phone]

## 2010-06-12 NOTE — Op Note (Signed)
NAMECOLEEN, Bentley                           ACCOUNT NO.:  [id number]   MEDICAL RECORD NO.:  [id number]                   PATIENT TYPE:  AMB   LOCATION:  ENDO                                 FACILITY:  [facility]   PHYSICIAN:  Bernette Redbird, M.D.                DATE OF BIRTH:  1933-10-05   DATE OF PROCEDURE:  01/29/2002  DATE OF DISCHARGE:                                 OPERATIVE REPORT   PROCEDURE PERFORMED:  Colonoscopy.   ENDOSCOPIST:  Florencia Reasons, M.D.   INDICATIONS FOR PROCEDURE:  The patient is a 77 year old female with  hemoccult positive stool noted on at least one outpatient Hemoccult through  her gynecologist's office, but with subsequent Hemoccults through my office  negative.   FINDINGS:  Sigmoid diverticulosis, otherwise normal to the terminal ileum.   DESCRIPTION OF PROCEDURE:  The nature, purpose and risks of the procedure  had been discussed with the patient, who provided written consent.  Sedation  for this procedure and the upper endoscopy which preceded it totaled  fentanyl 70 mcg and Versed 7 mg IV without arrhythmias or desaturation.   The Olympus adjustable tension pediatric video colonoscope was advanced to  the terminal ileum with just a little bit of external abdominal compression  to control looping.  Pullback was then performed.  The terminal ileum looked  normal.  The quality of the prep was excellent and it is felt that all areas  were well seen.   There was some sigmoid diverticulosis but this was otherwise a normal  examination.  Retroflexion in the rectum as well as reinspection of the  rectosigmoid disclosed no additional findings.  No polyps, cancer, colitis  or vascular malformations were noted.  No biopsies were obtained.  The  patient tolerated the procedure well and there were no apparent  complications.   IMPRESSION:  1. No source of heme positive stool identified.  2. Mild to moderate sigmoid and left side diverticulosis.  3.  The patient has a history of occasional rectal bleeding.  No source was     identified, so it is presumed to be of hemorrhoidal origin.  The patient     appeared to have mild internal hemorrhoids, nothing impressive.   PLAN:  Consider screening sigmoidoscopy in five years.                                                Bernette Redbird, M.D.    RB/MEDQ  D:  01/29/2002  T:  01/29/2002  Job:  [id number]   cc:   Pershing Cox, M.D.  [address]  [place]  [place] [address]  Fax: [phone]   Julieanne Manson  [address]  [place]  [place] [address]  Fax:  [phone]  

## 2010-07-29 ENCOUNTER — Other Ambulatory Visit: Payer: Self-pay | Admitting: Family Medicine

## 2010-10-30 ENCOUNTER — Other Ambulatory Visit: Payer: Self-pay | Admitting: Family Medicine

## 2010-11-27 ENCOUNTER — Encounter: Payer: Self-pay | Admitting: Family Medicine

## 2010-11-30 ENCOUNTER — Encounter: Payer: Self-pay | Admitting: Family Medicine

## 2010-11-30 ENCOUNTER — Ambulatory Visit (INDEPENDENT_AMBULATORY_CARE_PROVIDER_SITE_OTHER): Payer: Medicare Other | Admitting: Family Medicine

## 2010-11-30 VITALS — BP 104/62 | HR 76 | Temp 98.0°F | Ht 64.5 in | Wt 146.8 lb

## 2010-11-30 DIAGNOSIS — Z23 Encounter for immunization: Secondary | ICD-10-CM

## 2010-11-30 DIAGNOSIS — M899 Disorder of bone, unspecified: Secondary | ICD-10-CM

## 2010-11-30 DIAGNOSIS — I10 Essential (primary) hypertension: Secondary | ICD-10-CM

## 2010-11-30 DIAGNOSIS — E785 Hyperlipidemia, unspecified: Secondary | ICD-10-CM

## 2010-11-30 LAB — COMPREHENSIVE METABOLIC PANEL
ALT: 18 U/L (ref 0–35)
AST: 20 U/L (ref 0–37)
Albumin: 4.3 g/dL (ref 3.5–5.2)
BUN: 13 mg/dL (ref 6–23)
CO2: 30 mEq/L (ref 19–32)
Calcium: 9.5 mg/dL (ref 8.4–10.5)
Chloride: 101 mEq/L (ref 96–112)
Potassium: 4 mEq/L (ref 3.5–5.1)

## 2010-11-30 LAB — CBC WITH DIFFERENTIAL/PLATELET
Basophils Absolute: 0 10*3/uL (ref 0.0–0.1)
Eosinophils Absolute: 0.1 10*3/uL (ref 0.0–0.7)
MCHC: 33.3 g/dL (ref 30.0–36.0)
MCV: 84.8 fl (ref 78.0–100.0)
Monocytes Absolute: 0.3 10*3/uL (ref 0.1–1.0)
Neutrophils Relative %: 58.3 % (ref 43.0–77.0)
Platelets: 244 10*3/uL (ref 150.0–400.0)

## 2010-11-30 LAB — LIPID PANEL
Cholesterol: 228 mg/dL — ABNORMAL HIGH (ref 0–200)
Total CHOL/HDL Ratio: 5

## 2010-11-30 MED ORDER — LOSARTAN POTASSIUM-HCTZ 50-12.5 MG PO TABS: ORAL_TABLET | ORAL | Status: DC

## 2010-11-30 NOTE — Assessment & Plan Note (Signed)
bp in fair control at this time  No changes needed  Disc lifstyle change with low sodium diet and exercise   

## 2010-11-30 NOTE — Patient Instructions (Signed)
Pneumonia and flu vaccines today Labs today Continue calcium and vitamin D and walking for bones Avoid red meat/ fried foods/ egg yolks/ fatty breakfast meats/ butter, cheese and high fat dairy/ and shellfish  - for cholesterol  Keep up the good work

## 2010-11-30 NOTE — Progress Notes (Signed)
Subjective:    Patient ID: Sierra Bentley, female    DOB: 1933/03/15, 77 y.o.   MRN: [id number]  HPI Here for annual check up of chronic medical problems  Is feeling really good  Nothing new going on  Old cramping in legs went away   HTN in good control 104/64 No change in meds No cp or edema or ha Stays good at home  Does exercise several days per week -- walking   Wt is down 2 lb with bmi of 24  Mammogram nl in 2/12 Self exam- no lumps or changes   Pap/ gyn exam - goes to gyn [facility] Last visit last year nov -- does a pap smear --    Osteopenia 1/12-- dexa showed slt imp Is on PPI Is on ca and D Need to check D  Level  Pneumovax- was a long time ago - wants that today  Flu shot-- today  Td 07 zostavax 3/10  colonosc 5/09-- tics, recommended 5 y f/u (Dr Matthias Hughs)  No problems with hearing or vision   Need to labs today   Patient Active Problem List  Diagnoses  . HYPERLIPIDEMIA  . HYPERTENSION  . ALLERGIC RHINITIS  . GERD  . RHEUMATOID ARTHRITIS  . OSTEOPENIA  . URINARY INCONTINENCE   Past Medical History  Diagnosis Date  . GERD (gastroesophageal reflux disease)   . HTN (hypertension)   . Osteopenia   . Rheumatoid arthritis   . Urine incontinence   . Epistaxis   . Allergic rhinitis, cause unspecified   . Other malaise and fatigue   . Other and unspecified hyperlipidemia   . Cramp of limb   . Diverticulosis    Past Surgical History  Procedure Date  . Tonsillectomy   . Cystoscopy 2006  . Colonoscopy 2003  . Mole removal     rectal  . Breast biopsy 2/11    fibrocystic change  . Breast biopsy 8/11    fibrocystic change   History  Substance Use Topics  . Smoking status: Never Smoker   . Smokeless tobacco: Not on file  . Alcohol Use: Not on file   Family History  Problem Relation Age of Onset  . Heart attack Father 67  . Coronary artery disease Father   . Other Mother     Arrythmia  . Coronary artery disease Mother   . Hypertension  Mother   . Anxiety disorder Mother   . Breast cancer      2nd cousin  . Depression      family history   Allergies  Allergen Reactions  . Ace Inhibitors     REACTION: cough  . Alendronate Sodium     REACTION: reflux, trouble swallowing  . Ibandronate Sodium     REACTION: reflux and increased BP   Current Outpatient Prescriptions on File Prior to Visit  Medication Sig Dispense Refill  . fexofenadine (ALLEGRA) 180 MG tablet Take 180 mg by mouth daily as needed.        . Multiple Vitamin (MULTIVITAMIN) tablet Take 1 tablet by mouth daily.        Marland Kitchen omeprazole (PRILOSEC OTC) 20 MG tablet Take 40 mg by mouth daily.        . mometasone (NASONEX) 50 MCG/ACT nasal spray Place 1 spray into the nose daily as needed.              Review of Systems Review of Systems  Constitutional: Negative for fever, appetite change, fatigue and unexpected weight change.  Eyes: Negative for pain and visual disturbance.  Respiratory: Negative for cough and shortness of breath.   Cardiovascular: Negative for cp or palpitations    Gastrointestinal: Negative for nausea, diarrhea and constipation.  Genitourinary: Negative for urgency and frequency.  Skin: Negative for pallor or rash   Neurological: Negative for weakness, light-headedness, numbness and headaches.  Hematological: Negative for adenopathy. Does not bruise/bleed easily.  Psychiatric/Behavioral: Negative for dysphoric mood. The patient is not nervous/anxious.          Objective:   Physical Exam  Constitutional: She appears well-developed and well-nourished. No distress.  HENT:  Head: Normocephalic and atraumatic.  Right Ear: External ear normal.  Left Ear: External ear normal.  Nose: Nose normal.  Mouth/Throat: Oropharynx is clear and moist.  Eyes: Conjunctivae and EOM are normal. Pupils are equal, round, and reactive to light. No scleral icterus.  Neck: Normal range of motion. Neck supple. No JVD present. Carotid bruit is not  present. No thyromegaly present.  Cardiovascular: Normal rate, regular rhythm, normal heart sounds and intact distal pulses.  Exam reveals no gallop.   Pulmonary/Chest: Effort normal and breath sounds normal. No respiratory distress. She has no wheezes. She exhibits no tenderness.  Abdominal: Soft. Bowel sounds are normal. She exhibits no distension, no abdominal bruit and no mass. There is no tenderness.  Musculoskeletal: Normal range of motion. She exhibits no edema and no tenderness.  Lymphadenopathy:    She has no cervical adenopathy.  Neurological: She is alert. She has normal reflexes. No cranial nerve deficit. She exhibits normal muscle tone. Coordination normal.  Skin: Skin is warm and dry. No rash noted. No erythema. No pallor.  Psychiatric: She has a normal mood and affect.          Assessment & Plan:

## 2010-11-30 NOTE — Assessment & Plan Note (Signed)
With slt improvement in BMD last January Doing well with ca and D and walking  Will be due for check in another year

## 2010-11-30 NOTE — Assessment & Plan Note (Signed)
Lab Results  Component Value Date   CHOL 203* 05/27/2009   HDL 42.30 05/27/2009   LDLDIRECT 121.7 05/27/2009   TRIG 143.0 05/27/2009   CHOLHDL 5 05/27/2009   check today Disc goals for lipids and reasons to control them Rev labs with pt Rev low sat fat diet in detail

## 2010-12-01 LAB — VITAMIN D 25 HYDROXY (VIT D DEFICIENCY, FRACTURES): Vit D, 25-Hydroxy: 62 ng/mL (ref 30–89)

## 2011-01-26 HISTORY — PX: COLONOSCOPY: SHX174

## 2011-03-01 ENCOUNTER — Ambulatory Visit (INDEPENDENT_AMBULATORY_CARE_PROVIDER_SITE_OTHER): Payer: Medicare Other | Admitting: Family Medicine

## 2011-03-01 ENCOUNTER — Encounter: Payer: Self-pay | Admitting: Family Medicine

## 2011-03-01 VITALS — BP 128/76 | HR 80 | Temp 97.9°F | Ht 64.5 in | Wt 148.5 lb

## 2011-03-01 DIAGNOSIS — K219 Gastro-esophageal reflux disease without esophagitis: Secondary | ICD-10-CM

## 2011-03-01 DIAGNOSIS — E785 Hyperlipidemia, unspecified: Secondary | ICD-10-CM

## 2011-03-01 DIAGNOSIS — M899 Disorder of bone, unspecified: Secondary | ICD-10-CM

## 2011-03-01 DIAGNOSIS — R946 Abnormal results of thyroid function studies: Secondary | ICD-10-CM

## 2011-03-01 DIAGNOSIS — R9389 Abnormal findings on diagnostic imaging of other specified body structures: Secondary | ICD-10-CM | POA: Insufficient documentation

## 2011-03-01 DIAGNOSIS — M949 Disorder of cartilage, unspecified: Secondary | ICD-10-CM

## 2011-03-01 DIAGNOSIS — I10 Essential (primary) hypertension: Secondary | ICD-10-CM

## 2011-03-01 NOTE — Assessment & Plan Note (Signed)
Will need dexa upcoming next jan  Last scan improved  Will continue to follow Continue ca and D and restart exercise

## 2011-03-01 NOTE — Progress Notes (Signed)
Subjective:    Patient ID: Sierra Bentley, female    DOB: 17-May-1933, 78 y.o.   MRN: [id number]  HPI Here for f/u of hyperlipidemia and osteopenia  Also had [facility] screen -found mild carotid art dz in L and also ? Abn in thyroid  T of -1.8 for bone density  No thyroid enlargement/ pressure/pain or change in neck per pt   She has noticed more post nasal drip lately -- on allegra and no resp to claritin and nasonex  Allegra works the best   No goiter Lab Results  Component Value Date   TSH 1.63 11/30/2010     Overall feels ok -no new symptoms   Lipids LDL 142 Lab Results  Component Value Date   CHOL 228* 11/30/2010   CHOL 203* 05/27/2009   CHOL 227* 03/29/2008   Lab Results  Component Value Date   HDL 48.50 11/30/2010   HDL 56.21 05/27/2009   HDL 42.3 03/29/2008   No results found for this basename: LDLCALC   Lab Results  Component Value Date   TRIG 128.0 11/30/2010   TRIG 143.0 05/27/2009   TRIG 132 03/29/2008   Lab Results  Component Value Date   CHOLHDL 5 11/30/2010   CHOLHDL 5 05/27/2009   CHOLHDL 5.4 CALC 03/29/2008   Lab Results  Component Value Date   LDLDIRECT 155.9 11/30/2010   LDLDIRECT 121.7 05/27/2009   LDLDIRECT 144.3 03/29/2008   is really watching her diet now - last check here had been eating out and traveling too much  Eats steak too frequently , and also egg yolks   bp was up there 142/68 bp is  128/76   Today No cp or palpitations or headaches or edema  No side effects to medicines    Patient Active Problem List  Diagnoses  . HYPERLIPIDEMIA  . HYPERTENSION  . ALLERGIC RHINITIS  . GERD  . RHEUMATOID ARTHRITIS  . OSTEOPENIA  . URINARY INCONTINENCE  . Abnormal ultrasound of thyroid gland   Past Medical History  Diagnosis Date  . GERD (gastroesophageal reflux disease)   . HTN (hypertension)   . Osteopenia   . Rheumatoid arthritis   . Urine incontinence   . Epistaxis   . Allergic rhinitis, cause unspecified   . Other malaise and fatigue   . Other  and unspecified hyperlipidemia   . Cramp of limb   . Diverticulosis    Past Surgical History  Procedure Date  . Tonsillectomy   . Cystoscopy 2006  . Colonoscopy 2003  . Mole removal     rectal  . Breast biopsy 2/11    fibrocystic change  . Breast biopsy 8/11    fibrocystic change   History  Substance Use Topics  . Smoking status: Never Smoker   . Smokeless tobacco: Not on file  . Alcohol Use: Not on file   Family History  Problem Relation Age of Onset  . Heart attack Father 61  . Coronary artery disease Father   . Other Mother     Arrythmia  . Coronary artery disease Mother   . Hypertension Mother   . Anxiety disorder Mother   . Breast cancer      2nd cousin  . Depression      family history   Allergies  Allergen Reactions  . Ace Inhibitors     REACTION: cough  . Alendronate Sodium     REACTION: reflux, trouble swallowing  . Ibandronate Sodium     REACTION: reflux and increased BP  Current Outpatient Prescriptions on File Prior to Visit  Medication Sig Dispense Refill  . CycloSPORINE (RESTASIS OP) Place 2 drops into both eyes daily.        Marland Kitchen estradiol (ESTRACE) 0.1 MG/GM vaginal cream Place 2 g vaginally daily as needed.        . fexofenadine (ALLEGRA) 180 MG tablet Take 180 mg by mouth daily as needed.        Marland Kitchen losartan-hydrochlorothiazide (HYZAAR) 50-12.5 MG per tablet Take 1/2 tablet once daily by mouth  45 tablet  3  . Multiple Vitamin (MULTIVITAMIN) tablet Take 1 tablet by mouth daily.        Marland Kitchen omeprazole (PRILOSEC OTC) 20 MG tablet Take 40 mg by mouth daily.        Bertram Gala Glycol-Propyl Glycol (SYSTANE ULTRA OP) Place 1 drop into both eyes 4 (four) times daily.        . sodium chloride (MURO 128) 5 % ophthalmic ointment Place 1 drop into both eyes as needed.        . mometasone (NASONEX) 50 MCG/ACT nasal spray Place 1 spray into the nose daily as needed.           Review of Systems Review of Systems  Constitutional: Negative for fever, appetite  change, fatigue and unexpected weight change.  Eyes: Negative for pain and visual disturbance.  Respiratory: Negative for cough and shortness of breath.   Cardiovascular: Negative for cp or palpitations    Gastrointestinal: Negative for nausea, diarrhea and constipation. pos for acid reflux  Genitourinary: Negative for urgency and frequency.  Skin: Negative for pallor or rash   Neurological: Negative for weakness, light-headedness, numbness and headaches.  Hematological: Negative for adenopathy. Does not bruise/bleed easily.  Psychiatric/Behavioral: Negative for dysphoric mood. The patient is not nervous/anxious.         Objective:   Physical Exam  Constitutional: She appears well-developed and well-nourished. No distress.  HENT:  Head: Normocephalic and atraumatic.  Mouth/Throat: Oropharynx is clear and moist.  Eyes: Conjunctivae and EOM are normal. Pupils are equal, round, and reactive to light. No scleral icterus.  Neck: Normal range of motion. Neck supple. No JVD present. Carotid bruit is not present. Erythema present. No thyromegaly present.  Cardiovascular: Normal rate, regular rhythm and normal heart sounds.  Exam reveals no gallop.   Pulmonary/Chest: Effort normal and breath sounds normal. No respiratory distress. She has no wheezes.  Abdominal: Soft. Bowel sounds are normal. She exhibits no distension and no mass. There is no tenderness.  Musculoskeletal: Normal range of motion. She exhibits no edema and no tenderness.  Lymphadenopathy:    She has no cervical adenopathy.  Neurological: She is alert. She has normal reflexes. She displays no atrophy and no tremor. No cranial nerve deficit. She exhibits normal muscle tone. Coordination normal.  Skin: Skin is warm and dry. No rash noted. No erythema. No pallor.  Psychiatric: She has a normal mood and affect.          Assessment & Plan:

## 2011-03-01 NOTE — Assessment & Plan Note (Signed)
Incidental finding on her carotid doppler- ? Nodule sched thyroid US today Asymptomatic Nl tsh

## 2011-03-01 NOTE — Assessment & Plan Note (Signed)
bp in fair control at this time  No changes needed  Disc lifstyle change with low sodium diet and exercise   Better than it was at [facility]

## 2011-03-01 NOTE — Assessment & Plan Note (Signed)
Pt having symptoms- she will check bottle of prilosec to see if she is taking 40  If so - will f/u with Dr Matthias Hughs for GERD that is not well controlled

## 2011-03-01 NOTE — Assessment & Plan Note (Signed)
Improved but not at goal Disc goals for lipids and reasons to control them Rev labs with pt Rev low sat fat diet in detail Handouts given Lab 6 mo and f/u

## 2011-03-01 NOTE — Patient Instructions (Addendum)
You should be on 40 mg of prilosec a day- if this does not control acid reflux- you need to see your GI doctor  Avoid red meat/ fried foods/ egg yolks/ fatty breakfast meats/ butter, cheese and high fat dairy/ and shellfish   Schedule fasting labs in 6 months for cholesterol and then follow up

## 2011-03-05 ENCOUNTER — Ambulatory Visit
Admission: RE | Admit: 2011-03-05 | Discharge: 2011-03-05 | Disposition: A | Discharge status: Home / Self Care | Payer: Medicare Other | Source: Ambulatory Visit | Attending: Family Medicine | Admitting: Family Medicine

## 2011-03-05 DIAGNOSIS — R9389 Abnormal findings on diagnostic imaging of other specified body structures: Secondary | ICD-10-CM

## 2011-03-18 ENCOUNTER — Encounter: Payer: Self-pay | Admitting: Family Medicine

## 2011-03-23 ENCOUNTER — Encounter: Payer: Self-pay | Admitting: *Deleted

## 2011-08-12 ENCOUNTER — Telehealth: Payer: Self-pay

## 2011-08-12 NOTE — Telephone Encounter (Signed)
Pt has appt already scheduled 08/18/11 for lab and 08/31/11 for 6 mth f/u. Pt wanted to know if this would be annual exam. Pt last annual exam 11/2010. Pt said OK leave appts.

## 2011-08-18 ENCOUNTER — Other Ambulatory Visit (INDEPENDENT_AMBULATORY_CARE_PROVIDER_SITE_OTHER): Payer: Medicare Other

## 2011-08-18 DIAGNOSIS — E785 Hyperlipidemia, unspecified: Secondary | ICD-10-CM

## 2011-08-18 LAB — ALT: ALT: 16 U/L (ref 0–35)

## 2011-08-18 LAB — LIPID PANEL: Total CHOL/HDL Ratio: 4

## 2011-08-24 ENCOUNTER — Other Ambulatory Visit: Payer: Medicare Other

## 2011-08-31 ENCOUNTER — Encounter: Payer: Self-pay | Admitting: Family Medicine

## 2011-08-31 ENCOUNTER — Ambulatory Visit (INDEPENDENT_AMBULATORY_CARE_PROVIDER_SITE_OTHER): Payer: Medicare Other | Admitting: Family Medicine

## 2011-08-31 VITALS — BP 110/64 | HR 82 | Temp 98.3°F | Wt 148.0 lb

## 2011-08-31 DIAGNOSIS — E785 Hyperlipidemia, unspecified: Secondary | ICD-10-CM

## 2011-08-31 DIAGNOSIS — I10 Essential (primary) hypertension: Secondary | ICD-10-CM

## 2011-08-31 NOTE — Progress Notes (Signed)
Subjective:    Patient ID: Sierra Bentley, female    DOB: 01/09/1934, 78 y.o.   MRN: [id number]  HPI  Here for f/u of HTN and lipids   bp is     Today BP Readings from Last 3 Encounters:  08/31/11 110/64  03/01/11 128/76  11/30/10 104/62    No cp or palpitations or headaches or edema  No side effects to medicines     Wt is stable   Lab Results  Component Value Date   CHOL 209* 08/18/2011   CHOL 228* 11/30/2010   CHOL 203* 05/27/2009   Lab Results  Component Value Date   HDL 50.60 08/18/2011   HDL 40.98 11/30/2010   HDL 11.91 05/27/2009   No results found for this basename: [facility]   Lab Results  Component Value Date   TRIG 121.0 08/18/2011   TRIG 128.0 11/30/2010   TRIG 143.0 05/27/2009   Lab Results  Component Value Date   CHOLHDL 4 08/18/2011   CHOLHDL 5 11/30/2010   CHOLHDL 5 05/27/2009   Lab Results  Component Value Date   LDLDIRECT 134.1 08/18/2011   LDLDIRECT 155.9 11/30/2010   LDLDIRECT 121.7 05/27/2009    She really worked on [employer] -- watching diet  Ate a few things she should not have  Almost at goal     Lab Results  Component Value Date   ALT 16 08/18/2011   AST 21 08/18/2011   ALKPHOS 65 11/30/2010   BILITOT 0.7 11/30/2010    Patient Active Problem List  Diagnosis  . HYPERLIPIDEMIA  . HYPERTENSION  . ALLERGIC RHINITIS  . GERD  . RHEUMATOID ARTHRITIS  . OSTEOPENIA  . URINARY INCONTINENCE  . Abnormal ultrasound of thyroid gland   Past Medical History  Diagnosis Date  . GERD (gastroesophageal reflux disease)   . HTN (hypertension)   . Osteopenia   . Rheumatoid arthritis   . Urine incontinence   . Epistaxis   . Allergic rhinitis, cause unspecified   . Other malaise and fatigue   . Other and unspecified hyperlipidemia   . Cramp of limb   . Diverticulosis    Past Surgical History  Procedure Date  . Tonsillectomy   . Cystoscopy 2006  . Colonoscopy 2003  . Mole removal     rectal  . Breast biopsy 2/11    fibrocystic change  . Breast biopsy  8/11    fibrocystic change   History  Substance Use Topics  . Smoking status: Never Smoker   . Smokeless tobacco: Not on file  . Alcohol Use: Not on file   Family History  Problem Relation Age of Onset  . Heart attack Father 95  . Coronary artery disease Father   . Other Mother     Arrythmia  . Coronary artery disease Mother   . Hypertension Mother   . Anxiety disorder Mother   . Breast cancer      2nd cousin  . Depression      family history   Allergies  Allergen Reactions  . Ace Inhibitors     REACTION: cough  . Alendronate Sodium     REACTION: reflux, trouble swallowing  . Ibandronate Sodium     REACTION: reflux and increased BP   Current Outpatient Prescriptions on File Prior to Visit  Medication Sig Dispense Refill  . CycloSPORINE (RESTASIS OP) Place 2 drops into both eyes daily.        Marland Kitchen estradiol (ESTRACE) 0.1 MG/GM vaginal cream Place 2 g vaginally  daily as needed.        . fexofenadine (ALLEGRA) 180 MG tablet Take 180 mg by mouth daily as needed.        Marland Kitchen losartan-hydrochlorothiazide (HYZAAR) 50-12.5 MG per tablet Take 1/2 tablet once daily by mouth  45 tablet  3  . mometasone (NASONEX) 50 MCG/ACT nasal spray Place 1 spray into the nose daily as needed.       . Multiple Vitamin (MULTIVITAMIN) tablet Take 1 tablet by mouth daily.        Marland Kitchen omeprazole (PRILOSEC OTC) 20 MG tablet Take 40 mg by mouth daily.        Bertram Gala Glycol-Propyl Glycol (SYSTANE ULTRA OP) Place 1 drop into both eyes 4 (four) times daily.        . sodium chloride (MURO 128) 5 % ophthalmic ointment Place 1 drop into both eyes as needed.           Review of Systems Review of Systems  Constitutional: Negative for fever, appetite change, fatigue and unexpected weight change.  Eyes: Negative for pain and visual disturbance.  Respiratory: Negative for cough and shortness of breath.   Cardiovascular: Negative for cp or palpitations    Gastrointestinal: Negative for nausea, diarrhea and  constipation.  Genitourinary: Negative for urgency and frequency.  Skin: Negative for pallor or rash   Neurological: Negative for weakness, light-headedness, numbness and headaches.  Hematological: Negative for adenopathy. Does not bruise/bleed easily.  Psychiatric/Behavioral: Negative for dysphoric mood. The patient is not nervous/anxious.         Objective:   Physical Exam  Constitutional: She appears well-developed and well-nourished. No distress.  HENT:  Head: Normocephalic and atraumatic.  Mouth/Throat: Oropharynx is clear and moist.  Eyes: Conjunctivae and EOM are normal. Pupils are equal, round, and reactive to light. No scleral icterus.  Neck: Normal range of motion. Neck supple. No JVD present. Carotid bruit is not present. No thyromegaly present.  Cardiovascular: Normal rate, regular rhythm, normal heart sounds and intact distal pulses.  Exam reveals no gallop.   Pulmonary/Chest: Breath sounds normal. No respiratory distress. She has no wheezes.       No crackles   Abdominal: Soft. Bowel sounds are normal. She exhibits no distension, no abdominal bruit and no mass. There is no tenderness.  Musculoskeletal: She exhibits no edema and no tenderness.  Lymphadenopathy:    She has no cervical adenopathy.  Neurological: She is alert. She has normal reflexes. No cranial nerve deficit. She exhibits normal muscle tone. Coordination normal.  Skin: Skin is warm and dry. No rash noted. No erythema. No pallor.  Psychiatric: She has a normal mood and affect.          Assessment & Plan:

## 2011-08-31 NOTE — Assessment & Plan Note (Signed)
Quite improved with better diet Disc goals for lipids and reasons to control them Rev labs with pt Rev low sat fat diet in detail  Will continue to work on it to get LDL under 130 F/u 6 mo

## 2011-08-31 NOTE — Assessment & Plan Note (Signed)
bp in fair control at this time  No changes needed  Disc lifstyle change with low sodium diet and exercise   

## 2011-08-31 NOTE — Patient Instructions (Addendum)
Blood pressure is good Cholesterol is better- we are almost at goal  Consider support stockings for your legs Keep working on healthy diet and exercise  Follow up in 6 months for an annual exam with labs prior

## 2011-10-25 ENCOUNTER — Encounter: Payer: Self-pay | Admitting: Family Medicine

## 2011-12-12 ENCOUNTER — Other Ambulatory Visit: Payer: Self-pay | Admitting: Family Medicine

## 2011-12-16 ENCOUNTER — Ambulatory Visit (INDEPENDENT_AMBULATORY_CARE_PROVIDER_SITE_OTHER): Payer: Medicare Other

## 2011-12-16 DIAGNOSIS — Z23 Encounter for immunization: Secondary | ICD-10-CM

## 2012-02-23 ENCOUNTER — Telehealth: Payer: Self-pay | Admitting: Family Medicine

## 2012-02-23 DIAGNOSIS — K219 Gastro-esophageal reflux disease without esophagitis: Secondary | ICD-10-CM

## 2012-02-23 DIAGNOSIS — E785 Hyperlipidemia, unspecified: Secondary | ICD-10-CM

## 2012-02-23 DIAGNOSIS — I10 Essential (primary) hypertension: Secondary | ICD-10-CM

## 2012-02-23 DIAGNOSIS — M899 Disorder of bone, unspecified: Secondary | ICD-10-CM

## 2012-02-23 NOTE — Telephone Encounter (Signed)
Message copied by Judy Pimple on Wed Feb 23, 2012 10:25 AM ------      Message from: Alvina Chou      Created: Wed Feb 16, 2012  3:36 PM      Regarding: lab orders for Thursday, 1.30.14       Patient is scheduled for CPX labs, please order future labs, Thanks , Camelia Eng

## 2012-02-24 ENCOUNTER — Other Ambulatory Visit: Payer: Medicare Other

## 2012-02-25 ENCOUNTER — Other Ambulatory Visit (INDEPENDENT_AMBULATORY_CARE_PROVIDER_SITE_OTHER): Payer: Medicare Other

## 2012-02-25 DIAGNOSIS — K219 Gastro-esophageal reflux disease without esophagitis: Secondary | ICD-10-CM

## 2012-02-25 DIAGNOSIS — M899 Disorder of bone, unspecified: Secondary | ICD-10-CM

## 2012-02-25 DIAGNOSIS — I10 Essential (primary) hypertension: Secondary | ICD-10-CM

## 2012-02-25 DIAGNOSIS — E785 Hyperlipidemia, unspecified: Secondary | ICD-10-CM

## 2012-02-25 LAB — CBC WITH DIFFERENTIAL/PLATELET
Basophils Relative: 1.2 % (ref 0.0–3.0)
Eosinophils Absolute: 0.1 10*3/uL (ref 0.0–0.7)
Eosinophils Relative: 2.7 % (ref 0.0–5.0)
HCT: 40.2 % (ref 36.0–46.0)
Hemoglobin: 13.4 g/dL (ref 12.0–15.0)
Lymphs Abs: 2 10*3/uL (ref 0.7–4.0)
MCHC: 33.3 g/dL (ref 30.0–36.0)
MCV: 82.8 fl (ref 78.0–100.0)
Monocytes Absolute: 0.5 10*3/uL (ref 0.1–1.0)
Neutro Abs: 2.6 10*3/uL (ref 1.4–7.7)
Neutrophils Relative %: 49.8 % (ref 43.0–77.0)
RBC: 4.85 Mil/uL (ref 3.87–5.11)

## 2012-02-25 LAB — COMPREHENSIVE METABOLIC PANEL
AST: 18 U/L (ref 0–37)
Alkaline Phosphatase: 57 U/L (ref 39–117)
BUN: 18 mg/dL (ref 6–23)
Creatinine, Ser: 0.8 mg/dL (ref 0.4–1.2)
Glucose, Bld: 98 mg/dL (ref 70–99)
Total Bilirubin: 0.7 mg/dL (ref 0.3–1.2)

## 2012-02-25 LAB — LIPID PANEL
Cholesterol: 206 mg/dL — ABNORMAL HIGH (ref 0–200)
HDL: 41.8 mg/dL (ref >39.00)
Total CHOL/HDL Ratio: 5
Triglycerides: 109 mg/dL (ref 0.0–149.0)

## 2012-02-26 LAB — VITAMIN D 25 HYDROXY (VIT D DEFICIENCY, FRACTURES): Vit D, 25-Hydroxy: 62 ng/mL (ref 30–89)

## 2012-03-03 ENCOUNTER — Encounter: Payer: Self-pay | Admitting: Family Medicine

## 2012-03-03 ENCOUNTER — Ambulatory Visit (INDEPENDENT_AMBULATORY_CARE_PROVIDER_SITE_OTHER): Payer: Medicare Other | Admitting: Family Medicine

## 2012-03-03 VITALS — BP 108/64 | HR 77 | Temp 98.1°F | Ht 64.5 in | Wt 144.8 lb

## 2012-03-03 DIAGNOSIS — E785 Hyperlipidemia, unspecified: Secondary | ICD-10-CM

## 2012-03-03 DIAGNOSIS — I10 Essential (primary) hypertension: Secondary | ICD-10-CM

## 2012-03-03 DIAGNOSIS — M899 Disorder of bone, unspecified: Secondary | ICD-10-CM

## 2012-03-03 MED ORDER — LOSARTAN POTASSIUM-HCTZ 50-12.5 MG PO TABS: 0.5000 | ORAL_TABLET | Freq: Every day | ORAL | Status: DC

## 2012-03-03 NOTE — Assessment & Plan Note (Signed)
dexa was just done-pend report Disc imp of exercise and Ca and D  No falls or fx

## 2012-03-03 NOTE — Patient Instructions (Addendum)
For cholesterol Avoid red meat/ fried foods/ egg yolks/ fatty breakfast meats/ butter, cheese and high fat dairy/ and shellfish  Get your mammogram as planned

## 2012-03-03 NOTE — Progress Notes (Signed)
Subjective:    Patient ID: Sierra Bentley, female    DOB: December 10, 1933, 79 y.o.   MRN: [id number]  HPI Here for check up of chronic medical conditions and to review health mt list  Is doing ok overall   Her eyes are getting worse - frequent follow up - watches this closely   Wt is down 4 lb with bmi of 24 Is doing well with diet - needs to start exercise more regularly- is doing it more (walking)  Would consider yoga    mammo 2/13-has appt in march  Self exam -no changes or lumps   colonosc 9/13- up to date   Gyn- no problems at all Had visit with gyn / b exam and pap 1/14-all ok   OP Had dexa about 2 weeks ago at [facility]  Falls/ fx -none at all   Mood - is overall good - she is doing well     Hyperlipidemia Lab Results  Component Value Date   CHOL 206* 02/25/2012   CHOL 209* 08/18/2011   CHOL 228* 11/30/2010   Lab Results  Component Value Date   HDL 41.80 02/25/2012   HDL 40.98 08/18/2011   HDL 11.91 11/30/2010   No results found for this basename: [facility]   Lab Results  Component Value Date   TRIG 109.0 02/25/2012   TRIG 121.0 08/18/2011   TRIG 128.0 11/30/2010   Lab Results  Component Value Date   CHOLHDL 5 02/25/2012   CHOLHDL 4 08/18/2011   CHOLHDL 5 11/30/2010   Lab Results  Component Value Date   LDLDIRECT 130.4 02/25/2012   LDLDIRECT 134.1 08/18/2011   LDLDIRECT 155.9 11/30/2010    She does try to watch what she eats  Once in a while -- fried chicken/ not often No red meat  Patient Active Problem List  Diagnosis  . HYPERLIPIDEMIA  . HYPERTENSION  . ALLERGIC RHINITIS  . GERD  . RHEUMATOID ARTHRITIS  . OSTEOPENIA  . URINARY INCONTINENCE  . Abnormal ultrasound of thyroid gland   Past Medical History  Diagnosis Date  . GERD (gastroesophageal reflux disease)   . HTN (hypertension)   . Osteopenia   . Rheumatoid arthritis   . Urine incontinence   . Epistaxis   . Allergic rhinitis, cause unspecified   . Other malaise and fatigue   . Other and  unspecified hyperlipidemia   . Cramp of limb   . Diverticulosis    Past Surgical History  Procedure Date  . Tonsillectomy   . Cystoscopy 2006  . Colonoscopy 2003  . Mole removal     rectal  . Breast biopsy 2/11    fibrocystic change  . Breast biopsy 8/11    fibrocystic change   History  Substance Use Topics  . Smoking status: Never Smoker   . Smokeless tobacco: Not on file  . Alcohol Use: Yes   Family History  Problem Relation Age of Onset  . Heart attack Father 81  . Coronary artery disease Father   . Other Mother     Arrythmia  . Coronary artery disease Mother   . Hypertension Mother   . Anxiety disorder Mother   . Breast cancer      2nd cousin  . Depression      family history   Allergies  Allergen Reactions  . Ace Inhibitors     REACTION: cough  . Alendronate Sodium     REACTION: reflux, trouble swallowing  . Ibandronate Sodium     REACTION: reflux  and increased BP   Current Outpatient Prescriptions on File Prior to Visit  Medication Sig Dispense Refill  . CycloSPORINE (RESTASIS OP) Place 2 drops into both eyes daily.        Marland Kitchen estradiol (ESTRACE) 0.1 MG/GM vaginal cream Place 2 g vaginally daily as needed.        . fexofenadine (ALLEGRA) 180 MG tablet Take 180 mg by mouth daily as needed.        Marland Kitchen losartan-hydrochlorothiazide (HYZAAR) 50-12.5 MG per tablet TAKE 1/2 TABLET ONCE DAILY  45 tablet  0  . mometasone (NASONEX) 50 MCG/ACT nasal spray Place 1 spray into the nose daily as needed.       . Multiple Vitamin (MULTIVITAMIN) tablet Take 1 tablet by mouth daily.        Marland Kitchen omeprazole (PRILOSEC OTC) 20 MG tablet Take 40 mg by mouth daily.        Bertram Gala Glycol-Propyl Glycol (SYSTANE ULTRA OP) Place 1 drop into both eyes 4 (four) times daily.        . sodium chloride (MURO 128) 5 % ophthalmic ointment Place 1 drop into both eyes as needed.           Review of Systems Review of Systems  Constitutional: Negative for fever, appetite change, fatigue and  unexpected weight change.  Eyes: Negative for pain and visual disturbance.  Respiratory: Negative for cough and shortness of breath.   Cardiovascular: Negative for cp or palpitations    Gastrointestinal: Negative for nausea, diarrhea and constipation.  Genitourinary: Negative for urgency and frequency.  Skin: Negative for pallor or rash   Neurological: Negative for weakness, light-headedness, numbness and headaches.  Hematological: Negative for adenopathy. Does not bruise/bleed easily.  Psychiatric/Behavioral: Negative for dysphoric mood. The patient is not nervous/anxious.         Objective:   Physical Exam  Constitutional: She appears well-developed and well-nourished. No distress.  HENT:  Head: Normocephalic and atraumatic.  Right Ear: External ear normal.  Left Ear: External ear normal.  Nose: Nose normal.  Mouth/Throat: Oropharynx is clear and moist. No oropharyngeal exudate.  Eyes: Conjunctivae and EOM are normal. Pupils are equal, round, and reactive to light. Right eye exhibits no discharge. Left eye exhibits no discharge. No scleral icterus.  Neck: Normal range of motion. Neck supple. No JVD present. Carotid bruit is not present. No thyromegaly present.  Cardiovascular: Normal rate, regular rhythm, normal heart sounds and intact distal pulses.  Exam reveals no gallop.   Pulmonary/Chest: Effort normal and breath sounds normal. No respiratory distress. She has no wheezes. She exhibits no tenderness.  Abdominal: Soft. Bowel sounds are normal. She exhibits no distension, no abdominal bruit and no mass. There is no tenderness.  Musculoskeletal: Normal range of motion. She exhibits no edema and no tenderness.  Lymphadenopathy:    She has no cervical adenopathy.  Neurological: She is alert. She has normal reflexes. No cranial nerve deficit. She exhibits normal muscle tone. Coordination normal.  Skin: Skin is warm and dry. No rash noted. No erythema. No pallor.  Psychiatric: She has  a normal mood and affect.          Assessment & Plan:

## 2012-03-03 NOTE — Assessment & Plan Note (Signed)
bp in fair control at this time  No changes needed  Disc lifstyle change with low sodium diet and exercise  Med refilled Lab rev

## 2012-03-03 NOTE — Assessment & Plan Note (Signed)
Stable overall  Disc goals for lipids and reasons to control them Rev labs with pt Rev low sat fat diet in detail

## 2012-03-21 ENCOUNTER — Encounter: Payer: Self-pay | Admitting: Family Medicine

## 2012-04-14 DIAGNOSIS — H18519 Endothelial corneal dystrophy, unspecified eye: Secondary | ICD-10-CM | POA: Insufficient documentation

## 2012-04-14 DIAGNOSIS — Z961 Presence of intraocular lens: Secondary | ICD-10-CM | POA: Insufficient documentation

## 2012-04-14 DIAGNOSIS — H04129 Dry eye syndrome of unspecified lacrimal gland: Secondary | ICD-10-CM | POA: Insufficient documentation

## 2012-04-14 DIAGNOSIS — H353190 Nonexudative age-related macular degeneration, unspecified eye, stage unspecified: Secondary | ICD-10-CM | POA: Insufficient documentation

## 2012-05-23 ENCOUNTER — Encounter: Payer: Self-pay | Admitting: Family Medicine

## 2012-05-23 ENCOUNTER — Ambulatory Visit (INDEPENDENT_AMBULATORY_CARE_PROVIDER_SITE_OTHER): Payer: Medicare Other | Admitting: Family Medicine

## 2012-05-23 VITALS — BP 118/64 | HR 77 | Temp 98.0°F | Ht 64.5 in | Wt 147.5 lb

## 2012-05-23 DIAGNOSIS — L989 Disorder of the skin and subcutaneous tissue, unspecified: Secondary | ICD-10-CM | POA: Insufficient documentation

## 2012-05-23 DIAGNOSIS — I8390 Asymptomatic varicose veins of unspecified lower extremity: Secondary | ICD-10-CM | POA: Insufficient documentation

## 2012-05-23 NOTE — Assessment & Plan Note (Signed)
3-4 mm raised soft vascular lesion on anterior R ankle -for 2 months  Unsure if this is a vascular nevus of some sore or more likely extension of a varicose vein  Adv to compress it with stockings Ref to derm for eval of this and skin exam

## 2012-05-23 NOTE — Patient Instructions (Addendum)
We will do dermatology referral at check out  It may be a good idea to start wearing support stockings - to the thigh- to keep them from getting worse (when standing and sitting)  Elevate your legs whenever you can

## 2012-05-23 NOTE — Progress Notes (Signed)
Subjective:    Patient ID: Sierra Bentley, female    DOB: [birth year], 79 y.o.   MRN: [id number]  HPI Here with a spot on foot that she thinks is a blood blister  On R anterior ankle There for about 5-6 weeks Not painful/ no trauma and just not changing or going away May have started with a bruise  Not putting anything on it   Raised and smooth  It is in line with a varicose vein Used to wear supp hose when she was standing and working   Patient Active Problem List   Diagnosis Date Noted  . Abnormal ultrasound of thyroid gland 03/01/2011  . HYPERLIPIDEMIA 06/11/2008  . ALLERGIC RHINITIS 03/29/2008  . HYPERTENSION 12/26/2006  . GERD 12/26/2006  . RHEUMATOID ARTHRITIS 12/26/2006  . OSTEOPENIA 12/26/2006  . URINARY INCONTINENCE 12/26/2006   Past Medical History  Diagnosis Date  . GERD (gastroesophageal reflux disease)   . HTN (hypertension)   . Osteopenia   . Rheumatoid arthritis   . Urine incontinence   . Epistaxis   . Allergic rhinitis, cause unspecified   . Other malaise and fatigue   . Other and unspecified hyperlipidemia   . Cramp of limb   . Diverticulosis    Past Surgical History  Procedure Laterality Date  . Tonsillectomy    . Cystoscopy  2006  . Colonoscopy  2003  . Mole removal      rectal  . Breast biopsy  2/11    fibrocystic change  . Breast biopsy  8/11    fibrocystic change   History  Substance Use Topics  . Smoking status: Never Smoker   . Smokeless tobacco: Not on file  . Alcohol Use: Yes     Comment: occ   Family History  Problem Relation Age of Onset  . Heart attack Father 5  . Coronary artery disease Father   . Other Mother     Arrythmia  . Coronary artery disease Mother   . Hypertension Mother   . Anxiety disorder Mother   . Breast cancer      2nd cousin  . Depression      family history   Allergies  Allergen Reactions  . Ace Inhibitors     REACTION: cough  . Alendronate Sodium     REACTION: reflux, trouble swallowing  .  Ibandronate Sodium     REACTION: reflux and increased BP   Current Outpatient Prescriptions on File Prior to Visit  Medication Sig Dispense Refill  . CycloSPORINE (RESTASIS OP) Place 2 drops into both eyes daily.        Marland Kitchen estradiol (ESTRACE) 0.1 MG/GM vaginal cream Place 2 g vaginally daily as needed.        . fexofenadine (ALLEGRA) 180 MG tablet Take 180 mg by mouth daily as needed.        . fish oil-omega-3 fatty acids 1000 MG capsule Take 1 g by mouth daily.      Marland Kitchen losartan-hydrochlorothiazide (HYZAAR) 50-12.5 MG per tablet Take 0.5 tablets by mouth daily.  45 tablet  3  . mometasone (NASONEX) 50 MCG/ACT nasal spray Place 1 spray into the nose daily as needed.       . Multiple Vitamin (MULTIVITAMIN) tablet Take 1 tablet by mouth daily.        Marland Kitchen omeprazole (PRILOSEC OTC) 20 MG tablet Take 40 mg by mouth daily.        Bertram Gala Glycol-Propyl Glycol (SYSTANE ULTRA OP) Place 1 drop into both eyes  4 (four) times daily.        . sodium chloride (MURO 128) 5 % ophthalmic ointment Place 1 drop into both eyes as needed.         No current facility-administered medications on file prior to visit.     Review of Systems Review of Systems  Constitutional: Negative for fever, appetite change, fatigue and unexpected weight change.  Eyes: Negative for pain and visual disturbance.  Respiratory: Negative for cough and shortness of breath.   Cardiovascular: Negative for cp or palpitations    Gastrointestinal: Negative for nausea, diarrhea and constipation.  Genitourinary: Negative for urgency and frequency.  Skin: Negative for pallor or rash  neg for itching  Neurological: Negative for weakness, light-headedness, numbness and headaches.  Hematological: Negative for adenopathy. Does not bruise/bleed easily.  Psychiatric/Behavioral: Negative for dysphoric mood. The patient is not nervous/anxious.         Objective:   Physical Exam  Constitutional: She appears well-developed and well-nourished. No  distress.  HENT:  Head: Normocephalic and atraumatic.  Eyes: Conjunctivae and EOM are normal. Pupils are equal, round, and reactive to light. Right eye exhibits no discharge. Left eye exhibits no discharge.  Neck: Normal range of motion. Neck supple.  Cardiovascular: Normal rate and regular rhythm.   Musculoskeletal: She exhibits no edema and no tenderness.  Varicosities in lower legs to just above the knee bilat Compressible and nt No edema or palp cords  Lymphadenopathy:    She has no cervical adenopathy.  Neurological: She is alert. She has normal reflexes.  Skin: Skin is warm and dry. No rash noted. No pallor.  3-4 mm blue/vascular round lesion anterior R ankle- this follows the line of a varicose vein but shape is atypical for this  Blue nevus or varicosity are both in differential It is compressible and nt  Does not blanche  Psychiatric: She has a normal mood and affect.          Assessment & Plan:

## 2012-05-23 NOTE — Assessment & Plan Note (Signed)
Overall mild in feet and lower legs - suggested support hose to thigh when sitting or standing  Also elevation of legs when possible

## 2012-07-09 ENCOUNTER — Emergency Department: Payer: Self-pay | Admitting: Emergency Medicine

## 2012-11-09 ENCOUNTER — Ambulatory Visit (INDEPENDENT_AMBULATORY_CARE_PROVIDER_SITE_OTHER): Payer: Medicare Other

## 2012-11-09 DIAGNOSIS — Z23 Encounter for immunization: Secondary | ICD-10-CM

## 2013-02-25 ENCOUNTER — Telehealth: Payer: Self-pay | Admitting: Family Medicine

## 2013-02-25 DIAGNOSIS — M899 Disorder of bone, unspecified: Secondary | ICD-10-CM

## 2013-02-25 DIAGNOSIS — R9389 Abnormal findings on diagnostic imaging of other specified body structures: Secondary | ICD-10-CM

## 2013-02-25 DIAGNOSIS — M949 Disorder of cartilage, unspecified: Secondary | ICD-10-CM

## 2013-02-25 DIAGNOSIS — E785 Hyperlipidemia, unspecified: Secondary | ICD-10-CM

## 2013-02-25 DIAGNOSIS — I10 Essential (primary) hypertension: Secondary | ICD-10-CM

## 2013-02-25 NOTE — Telephone Encounter (Signed)
Message copied by Abner Greenspan on Sun Feb 25, 2013  9:23 PM ------      Message from: Ellamae Sia      Created: Tue Feb 20, 2013 12:24 PM      Regarding: Lab orders for Monday, 2.2.15       Patient is scheduled for CPX labs, please order future labs, Thanks , Terri       ------

## 2013-02-26 ENCOUNTER — Other Ambulatory Visit: Payer: Medicare Other

## 2013-03-05 ENCOUNTER — Encounter: Payer: Medicare Other | Admitting: Family Medicine

## 2013-03-07 ENCOUNTER — Encounter: Payer: Self-pay | Admitting: Family Medicine

## 2013-03-14 ENCOUNTER — Encounter: Payer: Self-pay | Admitting: *Deleted

## 2013-04-02 ENCOUNTER — Other Ambulatory Visit: Payer: Self-pay | Admitting: Family Medicine

## 2013-07-14 ENCOUNTER — Telehealth: Payer: Self-pay | Admitting: Family Medicine

## 2013-07-14 NOTE — Telephone Encounter (Signed)
Message copied by Abner Greenspan on Sat Jul 14, 2013  5:25 PM ------      Message from: Ellamae Sia      Created: Tue Jul 10, 2013  3:36 PM      Regarding: Lab orders for Wednesday, 6.24.15       Patient is scheduled for CPX labs, please order future labs, Thanks , Terri                   ------

## 2013-07-14 NOTE — Telephone Encounter (Signed)
I think there are future labs already ordered -thanks

## 2013-07-18 ENCOUNTER — Other Ambulatory Visit (INDEPENDENT_AMBULATORY_CARE_PROVIDER_SITE_OTHER): Payer: Medicare Other

## 2013-07-18 DIAGNOSIS — E785 Hyperlipidemia, unspecified: Secondary | ICD-10-CM

## 2013-07-18 DIAGNOSIS — R9389 Abnormal findings on diagnostic imaging of other specified body structures: Secondary | ICD-10-CM

## 2013-07-18 DIAGNOSIS — M899 Disorder of bone, unspecified: Secondary | ICD-10-CM

## 2013-07-18 DIAGNOSIS — R946 Abnormal results of thyroid function studies: Secondary | ICD-10-CM

## 2013-07-18 DIAGNOSIS — M949 Disorder of cartilage, unspecified: Principal | ICD-10-CM

## 2013-07-18 DIAGNOSIS — I10 Essential (primary) hypertension: Secondary | ICD-10-CM

## 2013-07-18 LAB — COMPREHENSIVE METABOLIC PANEL
ALBUMIN: 4.3 g/dL (ref 3.5–5.2)
ALK PHOS: 59 U/L (ref 39–117)
ALT: 17 U/L (ref 0–35)
AST: 20 U/L (ref 0–37)
BUN: 16 mg/dL (ref 6–23)
CO2: 29 mEq/L (ref 19–32)
CREATININE: 0.7 mg/dL (ref 0.4–1.2)
Calcium: 9.4 mg/dL (ref 8.4–10.5)
Chloride: 102 mEq/L (ref 96–112)
GFR: 83.36 mL/min (ref >60.00)
GLUCOSE: 101 mg/dL — AB (ref 70–99)
POTASSIUM: 3.6 meq/L (ref 3.5–5.1)
Sodium: 139 mEq/L (ref 135–145)
Total Bilirubin: 0.7 mg/dL (ref 0.2–1.2)
Total Protein: 7.1 g/dL (ref 6.0–8.3)

## 2013-07-18 LAB — CBC WITH DIFFERENTIAL/PLATELET
BASOS PCT: 0.8 % (ref 0.0–3.0)
Basophils Absolute: 0 10*3/uL (ref 0.0–0.1)
Eosinophils Absolute: 0.1 10*3/uL (ref 0.0–0.7)
Eosinophils Relative: 2.5 % (ref 0.0–5.0)
HEMATOCRIT: 40.1 % (ref 36.0–46.0)
Hemoglobin: 13.3 g/dL (ref 12.0–15.0)
LYMPHS ABS: 2 10*3/uL (ref 0.7–4.0)
Lymphocytes Relative: 34.1 % (ref 12.0–46.0)
MCHC: 33.3 g/dL (ref 30.0–36.0)
MCV: 84.3 fl (ref 78.0–100.0)
MONO ABS: 0.4 10*3/uL (ref 0.1–1.0)
Monocytes Relative: 6.5 % (ref 3.0–12.0)
NEUTROS ABS: 3.2 10*3/uL (ref 1.4–7.7)
NEUTROS PCT: 56.1 % (ref 43.0–77.0)
Platelets: 279 10*3/uL (ref 150.0–400.0)
RBC: 4.75 Mil/uL (ref 3.87–5.11)
RDW: 13.9 % (ref 11.5–15.5)
WBC: 5.7 10*3/uL (ref 4.0–10.5)

## 2013-07-18 LAB — TSH: TSH: 1.75 u[IU]/mL (ref 0.35–4.50)

## 2013-07-18 LAB — LIPID PANEL
CHOLESTEROL: 222 mg/dL — AB (ref 0–200)
HDL: 46.6 mg/dL (ref >39.00)
LDL Cholesterol: 154 mg/dL — ABNORMAL HIGH (ref 0–99)
NonHDL: 175.4
TRIGLYCERIDES: 109 mg/dL (ref 0.0–149.0)
Total CHOL/HDL Ratio: 5
VLDL: 21.8 mg/dL (ref 0.0–40.0)

## 2013-07-19 LAB — VITAMIN D 25 HYDROXY (VIT D DEFICIENCY, FRACTURES): Vit D, 25-Hydroxy: 79 ng/mL (ref 30–89)

## 2013-07-25 ENCOUNTER — Ambulatory Visit (INDEPENDENT_AMBULATORY_CARE_PROVIDER_SITE_OTHER): Payer: Medicare Other | Admitting: Family Medicine

## 2013-07-25 ENCOUNTER — Encounter: Payer: Self-pay | Admitting: Family Medicine

## 2013-07-25 VITALS — BP 124/74 | HR 76 | Temp 98.2°F | Ht 64.25 in | Wt 148.5 lb

## 2013-07-25 DIAGNOSIS — M949 Disorder of cartilage, unspecified: Secondary | ICD-10-CM

## 2013-07-25 DIAGNOSIS — Z0000 Encounter for general adult medical examination without abnormal findings: Secondary | ICD-10-CM

## 2013-07-25 DIAGNOSIS — E785 Hyperlipidemia, unspecified: Secondary | ICD-10-CM

## 2013-07-25 DIAGNOSIS — I10 Essential (primary) hypertension: Secondary | ICD-10-CM

## 2013-07-25 DIAGNOSIS — Z23 Encounter for immunization: Secondary | ICD-10-CM

## 2013-07-25 DIAGNOSIS — M899 Disorder of bone, unspecified: Secondary | ICD-10-CM

## 2013-07-25 MED ORDER — LOSARTAN POTASSIUM-HCTZ 50-12.5 MG PO TABS: ORAL_TABLET | ORAL | Status: DC

## 2013-07-25 NOTE — Progress Notes (Signed)
Pre visit review using our clinic review tool, if applicable. No additional management support is needed unless otherwise documented below in the visit note. 

## 2013-07-25 NOTE — Progress Notes (Signed)
Subjective:    Patient ID: Sierra Bentley, female    DOB: Jun 20, 1933, 80 y.o.   MRN: [id number]  HPI I have personally reviewed the Medicare Annual Wellness questionnaire and have noted 1. The patient's medical and social history 2. Their use of alcohol, tobacco or illicit drugs 3. Their current medications and supplements 4. The patient's functional ability including ADL's, fall risks, home safety risks and hearing or visual             impairment. 5. Diet and physical activities 6. Evidence for depression or mood disorders  The patients weight, height, BMI have been recorded in the chart and visual acuity is per eye clinic.  I have made referrals, counseling and provided education to the patient based review of the above and I have provided the pt with a written personalized care plan for preventive services.  See scanned forms.  Routine anticipatory guidance given to patient.  See health maintenance. Colon cancer screening colonosc 9/13 with no recall Breast cancer screening mammo and [place] 2/15- 1 y f/u (fibrocystic) Self breast exam-no lumps or change  Flu vaccine 10/14  Tetanus vaccine 1/07 Pneumovax 11/12 Zoster vaccine 3/10  Advance directive-does not have a living will - given packet  Cognitive function addressed- see scanned forms- and if abnormal then additional documentation follows. No major memory concerns - occ misplaces things   PMH and SH reviewed  Meds, vitals, and allergies reviewed.   ROS: See HPI.  Otherwise negative.    bp is stable today  No cp or palpitations or headaches or edema  No side effects to medicines  BP Readings from Last 3 Encounters:  07/25/13 124/74  05/23/12 118/64  03/03/12 108/64     Osteopenia -intol of alendronate adn ibandronate Last dexa 2/14 D level nl at 79 No fractures in the past year  - she is very careful (she stumbled over a cooler last summer)- is more careful , was not hurt    Results for orders placed in visit on  07/18/13  VITAMIN D 25 HYDROXY      Result Value Ref Range   Vit D, 25-Hydroxy 79  30 - 89 ng/mL  CBC WITH DIFFERENTIAL      Result Value Ref Range   WBC 5.7  4.0 - 10.5 K/uL   RBC 4.75  3.87 - 5.11 Mil/uL   Hemoglobin 13.3  12.0 - 15.0 g/dL   HCT 40.1  36.0 - 46.0 %   MCV 84.3  78.0 - 100.0 fl   MCHC 33.3  30.0 - 36.0 g/dL   RDW 13.9  11.5 - 15.5 %   Platelets 279.0  150.0 - 400.0 K/uL   Neutrophils Relative % 56.1  43.0 - 77.0 %   Lymphocytes Relative 34.1  12.0 - 46.0 %   Monocytes Relative 6.5  3.0 - 12.0 %   Eosinophils Relative 2.5  0.0 - 5.0 %   Basophils Relative 0.8  0.0 - 3.0 %   Neutro Abs 3.2  1.4 - 7.7 K/uL   Lymphs Abs 2.0  0.7 - 4.0 K/uL   Monocytes Absolute 0.4  0.1 - 1.0 K/uL   Eosinophils Absolute 0.1  0.0 - 0.7 K/uL   Basophils Absolute 0.0  0.0 - 0.1 K/uL  COMPREHENSIVE METABOLIC PANEL      Result Value Ref Range   Sodium 139  135 - 145 mEq/L   Potassium 3.6  3.5 - 5.1 mEq/L   Chloride 102  96 - 112  mEq/L   CO2 29  19 - 32 mEq/L   Glucose, Bld 101 (*) 70 - 99 mg/dL   BUN 16  6 - 23 mg/dL   Creatinine, Ser 0.7  0.4 - 1.2 mg/dL   Total Bilirubin 0.7  0.2 - 1.2 mg/dL   Alkaline Phosphatase 59  39 - 117 U/L   AST 20  0 - 37 U/L   ALT 17  0 - 35 U/L   Total Protein 7.1  6.0 - 8.3 g/dL   Albumin 4.3  3.5 - 5.2 g/dL   Calcium 9.4  8.4 - 10.5 mg/dL   GFR 83.36  >60.00 mL/min  LIPID PANEL      Result Value Ref Range   Cholesterol 222 (*) 0 - 200 mg/dL   Triglycerides 109.0  0.0 - 149.0 mg/dL   HDL 46.60  >39.00 mg/dL   VLDL 21.8  0.0 - 40.0 mg/dL   LDL Cholesterol 154 (*) 0 - 99 mg/dL   Total CHOL/HDL Ratio 5     NonHDL 175.40    TSH      Result Value Ref Range   TSH 1.75  0.35 - 4.50 uIU/mL        LDL cholesterol is higher from vacation- ate poorly for a week  Last year in 130s Is back to a better diet    Patient Active Problem List   Diagnosis Date Noted  . Encounter for Medicare annual wellness exam 07/25/2013  . Varicosities of leg  05/23/2012  . Skin lesion of right lower limb 05/23/2012  . Abnormal ultrasound of thyroid gland 03/01/2011  . HYPERLIPIDEMIA 06/11/2008  . ALLERGIC RHINITIS 03/29/2008  . HYPERTENSION 12/26/2006  . GERD 12/26/2006  . RHEUMATOID ARTHRITIS 12/26/2006  . OSTEOPENIA 12/26/2006  . URINARY INCONTINENCE 12/26/2006   Past Medical History  Diagnosis Date  . GERD (gastroesophageal reflux disease)   . HTN (hypertension)   . Osteopenia   . Rheumatoid arthritis(714.0)   . Urine incontinence   . Epistaxis   . Allergic rhinitis, cause unspecified   . Other malaise and fatigue   . Other and unspecified hyperlipidemia   . Cramp of limb   . Diverticulosis    Past Surgical History  Procedure Laterality Date  . Tonsillectomy    . Cystoscopy  2006  . Colonoscopy  2003  . Mole removal      rectal  . Breast biopsy  2/11    fibrocystic change  . Breast biopsy  8/11    fibrocystic change   History  Substance Use Topics  . Smoking status: Never Smoker   . Smokeless tobacco: Not on file  . Alcohol Use: No   Family History  Problem Relation Age of Onset  . Heart attack Father 35  . Coronary artery disease Father   . Other Mother     Arrythmia  . Coronary artery disease Mother   . Hypertension Mother   . Anxiety disorder Mother   . Breast cancer      2nd cousin  . Depression      family history   Allergies  Allergen Reactions  . Ace Inhibitors     REACTION: cough  . Alendronate Sodium     REACTION: reflux, trouble swallowing  . Chocolate   . Dust Mite Extract   . Ibandronate Sodium     REACTION: reflux and increased BP   Current Outpatient Prescriptions on File Prior to Visit  Medication Sig Dispense Refill  . CycloSPORINE (RESTASIS OP)  Place 2 drops into both eyes daily.        Marland Kitchen estradiol (ESTRACE) 0.1 MG/GM vaginal cream Place 2 g vaginally daily as needed.        . fexofenadine (ALLEGRA) 180 MG tablet Take 180 mg by mouth daily as needed.        . fish oil-omega-3  fatty acids 1000 MG capsule Take 1 g by mouth daily.      . mometasone (NASONEX) 50 MCG/ACT nasal spray Place 1 spray into the nose daily as needed.       . Multiple Vitamin (MULTIVITAMIN) tablet Take 1 tablet by mouth daily.        Marland Kitchen omeprazole (PRILOSEC OTC) 20 MG tablet Take 40 mg by mouth daily.        Vladimir Faster Glycol-Propyl Glycol (SYSTANE ULTRA OP) Place 1 drop into both eyes 4 (four) times daily.        . sodium chloride (MURO 128) 5 % ophthalmic ointment Place 1 drop into both eyes as needed.         No current facility-administered medications on file prior to visit.    Review of Systems Review of Systems  Constitutional: Negative for fever, appetite change, fatigue and unexpected weight change.  Eyes: Negative for pain and visual disturbance.  Respiratory: Negative for cough and shortness of breath.   Cardiovascular: Negative for cp or palpitations    Gastrointestinal: Negative for nausea, diarrhea and constipation.  Genitourinary: Negative for urgency and frequency.  Skin: Negative for pallor or rash   Neurological: Negative for weakness, light-headedness, numbness and headaches.  Hematological: Negative for adenopathy. Does not bruise/bleed easily.  Psychiatric/Behavioral: Negative for dysphoric mood. The patient is not nervous/anxious.         Objective:   Physical Exam  Constitutional: She appears well-developed and well-nourished. No distress.  HENT:  Head: Normocephalic and atraumatic.  Right Ear: External ear normal.  Left Ear: External ear normal.  Nose: Nose normal.  Mouth/Throat: Oropharynx is clear and moist.  Eyes: Conjunctivae and EOM are normal. Pupils are equal, round, and reactive to light. Right eye exhibits no discharge. Left eye exhibits no discharge. No scleral icterus.  Neck: Normal range of motion. Neck supple. No JVD present. No thyromegaly present.  Cardiovascular: Normal rate, regular rhythm, normal heart sounds and intact distal pulses.  Exam  reveals no gallop.   Pulmonary/Chest: Effort normal and breath sounds normal. No respiratory distress. She has no wheezes. She has no rales.  Abdominal: Soft. Bowel sounds are normal. She exhibits no distension and no mass. There is no tenderness.  Musculoskeletal: She exhibits no edema and no tenderness.  Lymphadenopathy:    She has no cervical adenopathy.  Neurological: She is alert. She has normal reflexes. No cranial nerve deficit. She exhibits normal muscle tone. Coordination normal.  Skin: Skin is warm and dry. No rash noted. No erythema. No pallor.  Psychiatric: She has a normal mood and affect.          Assessment & Plan:   Problem List Items Addressed This Visit     Cardiovascular and Mediastinum   HYPERTENSION - Primary      bp in fair control at this time  BP Readings from Last 1 Encounters:  07/25/13 124/74   No changes needed Disc lifstyle change with low sodium diet and exercise  Labs rev    Relevant Medications      losartan-hydrochlorothiazide (HYZAAR) 50-12.5 MG per tablet     Musculoskeletal and  Integument   OSTEOPENIA     dexa utd Has been intol of bisphosphenates Disc need for calcium/ vitamin D/ wt bearing exercise and bone density test every 2 y to monitor Disc safety/ fracture risk in detail        Other   HYPERLIPIDEMIA     Lipids are up Handout given on diet Disc goals for lipids and reasons to control them Rev labs with pt Rev low sat fat diet in detail     Relevant Medications      losartan-hydrochlorothiazide (HYZAAR) 50-12.5 MG per tablet   Encounter for Medicare annual wellness exam     Reviewed health habits including diet and exercise and skin cancer prevention Reviewed appropriate screening tests for age  Also reviewed health mt list, fam hx and immunization status , as well as social and family history   See HPI Labs rev prevnar today     Other Visit Diagnoses   Need for vaccination with 13-polyvalent pneumococcal  conjugate vaccine        Relevant Orders       Pneumococcal conjugate vaccine 13-valent (Completed)

## 2013-07-25 NOTE — Patient Instructions (Signed)
prevnar (pneumonia) vaccine today  Exercise regularly for bone and general health Eat a healthy diet  Cholesterol is up    Avoid red meat/ fried foods/ egg yolks/ fatty breakfast meats/ butter, cheese and high fat dairy/ and shellfish      Fat and Cholesterol Control Diet Fat and cholesterol levels in your blood and organs are influenced by your diet. High levels of fat and cholesterol may lead to diseases of the heart, small and large blood vessels, gallbladder, liver, and pancreas. CONTROLLING FAT AND CHOLESTEROL WITH DIET Although exercise and lifestyle factors are important, your diet is key. That is because certain foods are known to raise cholesterol and others to lower it. The goal is to balance foods for their effect on cholesterol and more importantly, to replace saturated and trans fat with other types of fat, such as monounsaturated fat, polyunsaturated fat, and omega-3 fatty acids. On average, a person should consume no more than 15 to 17 g of saturated fat daily. Saturated and trans fats are considered "bad" fats, and they will raise LDL cholesterol. Saturated fats are primarily found in animal products such as meats, butter, and cream. However, that does not mean you need to give up all your favorite foods. Today, there are good tasting, low-fat, low-cholesterol substitutes for most of the things you like to eat. Choose low-fat or nonfat alternatives. Choose round or loin cuts of red meat. These types of cuts are lowest in fat and cholesterol. Chicken (without the skin), fish, veal, and ground [place] breast are great choices. Eliminate fatty meats, such as hot dogs and salami. Even shellfish have little or no saturated fat. Have a 3 oz (85 g) portion when you eat lean meat, poultry, or fish. Trans fats are also called "partially hydrogenated oils." They are oils that have been scientifically manipulated so that they are solid at room temperature resulting in a longer shelf life and improved  taste and texture of foods in which they are added. Trans fats are found in stick margarine, some tub margarines, cookies, crackers, and baked goods.  When baking and cooking, oils are a great substitute for butter. The monounsaturated oils are especially beneficial since it is believed they lower LDL and raise HDL. The oils you should avoid entirely are saturated tropical oils, such as coconut and palm.  Remember to eat a lot from food groups that are naturally free of saturated and trans fat, including fish, fruit, vegetables, beans, grains (barley, rice, couscous, bulgur wheat), and pasta (without cream sauces).  IDENTIFYING FOODS THAT LOWER FAT AND CHOLESTEROL  Soluble fiber may lower your cholesterol. This type of fiber is found in fruits such as apples, vegetables such as broccoli, potatoes, and carrots, legumes such as beans, peas, and lentils, and grains such as barley. Foods fortified with plant sterols (phytosterol) may also lower cholesterol. You should eat at least 2 g per day of these foods for a cholesterol lowering effect.  Read package labels to identify low-saturated fats, trans fat free, and low-fat foods at the supermarket. Select cheeses that have only 2 to 3 g saturated fat per ounce. Use a heart-healthy tub margarine that is free of trans fats or partially hydrogenated oil. When buying baked goods (cookies, crackers), avoid partially hydrogenated oils. Breads and muffins should be made from whole grains (whole-wheat or whole oat flour, instead of "flour" or "enriched flour"). Buy non-creamy canned soups with reduced salt and no added fats.  FOOD PREPARATION TECHNIQUES  Never deep-fry. If you  must fry, either stir-fry, which uses very little fat, or use non-stick cooking sprays. When possible, broil, bake, or roast meats, and steam vegetables. Instead of putting butter or margarine on vegetables, use lemon and herbs, applesauce, and cinnamon (for squash and sweet potatoes). Use nonfat  yogurt, salsa, and low-fat dressings for salads.  LOW-SATURATED FAT / LOW-FAT FOOD SUBSTITUTES Meats / Saturated Fat (g)  Avoid: Steak, marbled (3 oz/85 g) / 11 g  Choose: Steak, lean (3 oz/85 g) / 4 g  Avoid: Hamburger (3 oz/85 g) / 7 g  Choose: Hamburger, lean (3 oz/85 g) / 5 g  Avoid: Ham (3 oz/85 g) / 6 g  Choose: Ham, lean cut (3 oz/85 g) / 2.4 g  Avoid: Chicken, with skin, dark meat (3 oz/85 g) / 4 g  Choose: Chicken, skin removed, dark meat (3 oz/85 g) / 2 g  Avoid: Chicken, with skin, light meat (3 oz/85 g) / 2.5 g  Choose: Chicken, skin removed, light meat (3 oz/85 g) / 1 g Dairy / Saturated Fat (g)  Avoid: Whole milk (1 cup) / 5 g  Choose: Low-fat milk, 2% (1 cup) / 3 g  Choose: Low-fat milk, 1% (1 cup) / 1.5 g  Choose: Skim milk (1 cup) / 0.3 g  Avoid: Hard cheese (1 oz/28 g) / 6 g  Choose: Skim milk cheese (1 oz/28 g) / 2 to 3 g  Avoid: Cottage cheese, 4% fat (1 cup) / 6.5 g  Choose: Low-fat cottage cheese, 1% fat (1 cup) / 1.5 g  Avoid: Ice cream (1 cup) / 9 g  Choose: Sherbet (1 cup) / 2.5 g  Choose: Nonfat frozen yogurt (1 cup) / 0.3 g  Choose: Frozen fruit bar / trace  Avoid: Whipped cream (1 tbs) / 3.5 g  Choose: Nondairy whipped topping (1 tbs) / 1 g Condiments / Saturated Fat (g)  Avoid: Mayonnaise (1 tbs) / 2 g  Choose: Low-fat mayonnaise (1 tbs) / 1 g  Avoid: Butter (1 tbs) / 7 g  Choose: Extra light margarine (1 tbs) / 1 g  Avoid: Coconut oil (1 tbs) / 11.8 g  Choose: Olive oil (1 tbs) / 1.8 g  Choose: Corn oil (1 tbs) / 1.7 g  Choose: Safflower oil (1 tbs) / 1.2 g  Choose: Sunflower oil (1 tbs) / 1.4 g  Choose: Soybean oil (1 tbs) / 2.4 g  Choose: Canola oil (1 tbs) / 1 g Document Released: 01/11/2005 Document Revised: 05/08/2012 Document Reviewed: 07/02/2010 Exit[employer] Patient Information 2015 [place], [place]. This information is not intended to replace advice given to you by your health care provider. Make sure you  discuss any questions you have with your health care provider.

## 2013-07-26 ENCOUNTER — Telehealth: Payer: Self-pay | Admitting: Family Medicine

## 2013-07-26 NOTE — Assessment & Plan Note (Signed)
Reviewed health habits including diet and exercise and skin cancer prevention Reviewed appropriate screening tests for age  Also reviewed health mt list, fam hx and immunization status , as well as social and family history    See HPI Labs rev prevnar today    

## 2013-07-26 NOTE — Assessment & Plan Note (Signed)
Lipids are up Handout given on diet Disc goals for lipids and reasons to control them Rev labs with pt Rev low sat fat diet in detail

## 2013-07-26 NOTE — Assessment & Plan Note (Signed)
bp in fair control at this time  BP Readings from Last 1 Encounters:  07/25/13 124/74   No changes needed Disc lifstyle change with low sodium diet and exercise  Labs rev

## 2013-07-26 NOTE — Telephone Encounter (Signed)
Relevant patient education assigned to patient using Emmi. ° °

## 2013-07-26 NOTE — Assessment & Plan Note (Signed)
dexa utd Has been intol of bisphosphenates Disc need for calcium/ vitamin D/ wt bearing exercise and bone density test every 2 y to monitor Disc safety/ fracture risk in detail

## 2013-08-02 ENCOUNTER — Ambulatory Visit: Payer: Self-pay | Admitting: Internal Medicine

## 2013-08-02 ENCOUNTER — Telehealth: Payer: Self-pay | Admitting: Family Medicine

## 2013-08-02 NOTE — Telephone Encounter (Addendum)
Sounds like local reaction to pneumonia shot.  No appts available sooner. I could see her at 6:30pm today otherwise will need to see [place] tomorrow or [facility] eval tonight. Recommend cool compresses to site in interim.

## 2013-08-02 NOTE — Telephone Encounter (Signed)
Patient Information:  Caller Name: Emi  Phone: [phone]  Patient: Sierra Bentley  Gender: Female  DOB: December 03, 1933  Age: 80 Years  PCP: [facility], [facility], [facility])  Office Follow Up:  Does the office need to follow up with this patient?: Yes  Instructions For The Office: Needs visit  RN Note:  Patient calling regarding right arm redness and warmth at injection site of vaccine which she received on 07/26/13.  Descibes length ast 1 1/2 in x 3/4 width.  Warm to touch.  Symptoms  Reason For Call & Symptoms: pneumonia vaccine  Reviewed Health History In EMR: Yes  Reviewed Medications In EMR: Yes  Reviewed Allergies In EMR: Yes  Reviewed Surgeries / Procedures: Yes  Date of Onset of Symptoms: 08/01/2013  Guideline(s) Used:  Immunization Reactions  Disposition Per Guideline:   Go to Office Now  Reason For Disposition Reached:   Redness or red streak around the injection site begins > 48 hours after shot  Advice Given:  N/A  RN Overrode Recommendation:  Follow Up With Office Later  Patient unable to come at 16:00 appointment needs sooner appointment and non available.

## 2013-08-02 NOTE — Telephone Encounter (Signed)
Patient notified. She cannot make appt this evening due to having a hair appt. She will come in the morning.

## 2013-08-03 ENCOUNTER — Ambulatory Visit (INDEPENDENT_AMBULATORY_CARE_PROVIDER_SITE_OTHER): Payer: Medicare Other | Admitting: Family Medicine

## 2013-08-03 ENCOUNTER — Encounter: Payer: Self-pay | Admitting: Family Medicine

## 2013-08-03 VITALS — BP 124/60 | HR 80 | Temp 97.9°F | Wt 147.5 lb

## 2013-08-03 DIAGNOSIS — T50Z95A Adverse effect of other vaccines and biological substances, initial encounter: Secondary | ICD-10-CM

## 2013-08-03 DIAGNOSIS — T888XXA Other specified complications of surgical and medical care, not elsewhere classified, initial encounter: Secondary | ICD-10-CM

## 2013-08-03 NOTE — Progress Notes (Signed)
Pre visit review using our clinic review tool, if applicable. No additional management support is needed unless otherwise documented below in the visit note. 

## 2013-08-03 NOTE — Patient Instructions (Signed)
Cool compresses to skin on arm - should continue to heal well I do think this was reaction to pneumonia shot - fortunately we don't need any more pneumonia shots. Let us know if redness spreading or getting more angry.

## 2013-08-03 NOTE — Progress Notes (Signed)
BP 124/60  Pulse 80  Temp(Src) 97.9 F (36.6 C) (Oral)  Wt 147 lb 8 oz (66.906 kg)   CC: vaccine reaction?  Subjective:    Patient ID: Sierra Bentley, female    DOB: January 08, 1934, 80 y.o.   MRN: [id number]  HPI: Sierra Bentley is a 80 y.o. female presenting on 08/03/2013 for Medication Reaction   Received prevnar into L upper arm 07/25/2013 at recent medicare wellness visit. Over weekend while looking at cars started feeling very somnolent - had to have husband drive. Went straight to bed and slept. That night when undressed for bed, noticed redness of skin medial to L nipple which has since resolved. 2 d ago noticed redness at site of prevnar, no pain or pruritis. Seemed to spread 1 day later and became warm. Today improving - less angry red at site of prevnar vaccine.   Denies fever/chills, dyspnea, abd pain or nausea.  UTD mammogram.  Relevant past medical, surgical, family and social history reviewed and updated as indicated.  Allergies and medications reviewed and updated. Current Outpatient Prescriptions on File Prior to Visit  Medication Sig  . CycloSPORINE (RESTASIS OP) Place 2 drops into both eyes daily.    Marland Kitchen estradiol (ESTRACE) 0.1 MG/GM vaginal cream Place 2 g vaginally daily as needed.    . fexofenadine (ALLEGRA) 180 MG tablet Take 180 mg by mouth daily as needed.    . fish oil-omega-3 fatty acids 1000 MG capsule Take 1 g by mouth daily.  Marland Kitchen losartan-hydrochlorothiazide (HYZAAR) 50-12.5 MG per tablet TAKE 0.5 TABLET (1/2TABLET) BY MOUTH DAILY.  . mometasone (NASONEX) 50 MCG/ACT nasal spray Place 1 spray into the nose daily as needed.   . Multiple Vitamin (MULTIVITAMIN) tablet Take 1 tablet by mouth daily.    Marland Kitchen omeprazole (PRILOSEC OTC) 20 MG tablet Take 40 mg by mouth daily.    Vladimir Faster Glycol-Propyl Glycol (SYSTANE ULTRA OP) Place 1 drop into both eyes 4 (four) times daily.    . sodium chloride (MURO 128) 5 % ophthalmic ointment Place 1 drop into both eyes as needed.      No current facility-administered medications on file prior to visit.    Review of Systems Per HPI unless specifically indicated above    Objective:    BP 124/60  Pulse 80  Temp(Src) 97.9 F (36.6 C) (Oral)  Wt 147 lb 8 oz (66.906 kg)  Physical Exam  Nursing note and vitals reviewed. Constitutional: She appears well-developed and well-nourished. No distress.  Skin: Rash noted.  5x7cm mildly erythematous and warm rash around site of prevnar.   Results for orders placed in visit on 07/18/13  VITAMIN D 25 HYDROXY      Result Value Ref Range   Vit D, 25-Hydroxy 79  30 - 89 ng/mL  CBC WITH DIFFERENTIAL      Result Value Ref Range   WBC 5.7  4.0 - 10.5 K/uL   RBC 4.75  3.87 - 5.11 Mil/uL   Hemoglobin 13.3  12.0 - 15.0 g/dL   HCT 40.1  36.0 - 46.0 %   MCV 84.3  78.0 - 100.0 fl   MCHC 33.3  30.0 - 36.0 g/dL   RDW 13.9  11.5 - 15.5 %   Platelets 279.0  150.0 - 400.0 K/uL   Neutrophils Relative % 56.1  43.0 - 77.0 %   Lymphocytes Relative 34.1  12.0 - 46.0 %   Monocytes Relative 6.5  3.0 - 12.0 %   Eosinophils Relative 2.5  0.0 - 5.0 %   Basophils Relative 0.8  0.0 - 3.0 %   Neutro Abs 3.2  1.4 - 7.7 K/uL   Lymphs Abs 2.0  0.7 - 4.0 K/uL   Monocytes Absolute 0.4  0.1 - 1.0 K/uL   Eosinophils Absolute 0.1  0.0 - 0.7 K/uL   Basophils Absolute 0.0  0.0 - 0.1 K/uL  COMPREHENSIVE METABOLIC PANEL      Result Value Ref Range   Sodium 139  135 - 145 mEq/L   Potassium 3.6  3.5 - 5.1 mEq/L   Chloride 102  96 - 112 mEq/L   CO2 29  19 - 32 mEq/L   Glucose, Bld 101 (*) 70 - 99 mg/dL   BUN 16  6 - 23 mg/dL   Creatinine, Ser 0.7  0.4 - 1.2 mg/dL   Total Bilirubin 0.7  0.2 - 1.2 mg/dL   Alkaline Phosphatase 59  39 - 117 U/L   AST 20  0 - 37 U/L   ALT 17  0 - 35 U/L   Total Protein 7.1  6.0 - 8.3 g/dL   Albumin 4.3  3.5 - 5.2 g/dL   Calcium 9.4  8.4 - 10.5 mg/dL   GFR 83.36  >60.00 mL/min  LIPID PANEL      Result Value Ref Range   Cholesterol 222 (*) 0 - 200 mg/dL    Triglycerides 109.0  0.0 - 149.0 mg/dL   HDL 46.60  >39.00 mg/dL   VLDL 21.8  0.0 - 40.0 mg/dL   LDL Cholesterol 154 (*) 0 - 99 mg/dL   Total CHOL/HDL Ratio 5     NonHDL 175.40    TSH      Result Value Ref Range   TSH 1.75  0.35 - 4.50 uIU/mL      Assessment & Plan:   Problem List Items Addressed This Visit   Vaccine reaction - Primary     Anticipate prevnar reaction - reassurance provided. Improving on its own.        Follow up plan: Return as needed.

## 2013-08-03 NOTE — Assessment & Plan Note (Signed)
Anticipate prevnar reaction - reassurance provided. Improving on its own.

## 2013-11-16 ENCOUNTER — Ambulatory Visit (INDEPENDENT_AMBULATORY_CARE_PROVIDER_SITE_OTHER): Payer: Medicare Other

## 2013-11-16 DIAGNOSIS — Z23 Encounter for immunization: Secondary | ICD-10-CM

## 2014-02-08 ENCOUNTER — Ambulatory Visit (INDEPENDENT_AMBULATORY_CARE_PROVIDER_SITE_OTHER): Payer: 59 | Admitting: Family Medicine

## 2014-02-08 ENCOUNTER — Encounter: Payer: Self-pay | Admitting: Family Medicine

## 2014-02-08 VITALS — BP 150/76 | HR 87 | Temp 98.2°F | Ht 64.25 in | Wt 149.8 lb

## 2014-02-08 DIAGNOSIS — J0101 Acute recurrent maxillary sinusitis: Secondary | ICD-10-CM

## 2014-02-08 DIAGNOSIS — I10 Essential (primary) hypertension: Secondary | ICD-10-CM

## 2014-02-08 DIAGNOSIS — J019 Acute sinusitis, unspecified: Secondary | ICD-10-CM | POA: Insufficient documentation

## 2014-02-08 MED ORDER — AMOXICILLIN 500 MG PO CAPS: 1000.0000 mg | ORAL_CAPSULE | Freq: Two times a day (BID) | ORAL | Status: DC

## 2014-02-08 NOTE — Progress Notes (Signed)
   Subjective:    Patient ID: Sierra Bentley, female    DOB: 05-14-1933, 81 y.o.   MRN: [id number]  Cough This is a new problem. The current episode started in the past 7 days. The problem has been gradually worsening. The cough is productive of sputum. Associated symptoms include ear pain, headaches, nasal congestion and rhinorrhea. Pertinent negatives include no chest pain, chills, ear congestion, fever, myalgias, postnasal drip, rash, shortness of breath or wheezing. Associated symptoms comments: Sinus pressure  left ear   sneezing. The symptoms are aggravated by lying down (occ cough wakes up at night). Risk factors: nonsmoker. Treatments tried: coricidin, mucinex. The treatment provided mild relief. Her past medical history is significant for environmental allergies. There is no history of asthma, bronchiectasis, bronchitis, COPD, emphysema or pneumonia.   BP Readings from Last 3 Encounters:  02/08/14 150/76  08/03/13 124/60  07/25/13 124/74    She has had similar issues in past since November.thirsd episode since 11/2014  She states she is better but never fully better. This is   on coricidin,  She treated with OTC meds. No antibiotics.  On allegra for allergies through the year. Using nasal gel.  Review of Systems  Constitutional: Negative for fever and chills.  HENT: Positive for ear pain and rhinorrhea. Negative for postnasal drip.   Respiratory: Positive for cough. Negative for shortness of breath and wheezing.   Cardiovascular: Negative for chest pain.  Musculoskeletal: Negative for myalgias.  Skin: Negative for rash.  Allergic/Immunologic: Positive for environmental allergies.  Neurological: Positive for headaches.       Objective:   Physical Exam  Constitutional: Vital signs are normal. She appears well-developed and well-nourished. She is cooperative.  Non-toxic appearance. She does not appear ill. No distress.  HENT:  Head: Normocephalic.  Right Ear: Hearing,  tympanic membrane, external ear and ear canal normal. Tympanic membrane is not erythematous, not retracted and not bulging.  Left Ear: Hearing, tympanic membrane, external ear and ear canal normal. Tympanic membrane is not erythematous, not retracted and not bulging.  Nose: Mucosal edema and rhinorrhea present. Right sinus exhibits no maxillary sinus tenderness and no frontal sinus tenderness. Left sinus exhibits maxillary sinus tenderness. Left sinus exhibits no frontal sinus tenderness.  Mouth/Throat: Uvula is midline, oropharynx is clear and moist and mucous membranes are normal.  Eyes: Conjunctivae, EOM and lids are normal. Pupils are equal, round, and reactive to light. Lids are everted and swept, no foreign bodies found.  Neck: Trachea normal and normal range of motion. Neck supple. Carotid bruit is not present. No thyroid mass and no thyromegaly present.  Cardiovascular: Normal rate, regular rhythm, S1 normal, S2 normal, normal heart sounds, intact distal pulses and normal pulses.  Exam reveals no gallop and no friction rub.   No murmur heard. Pulmonary/Chest: Effort normal and breath sounds normal. No tachypnea. No respiratory distress. She has no decreased breath sounds. She has no wheezes. She has no rhonchi. She has no rales.  Neurological: She is alert.  Skin: Skin is warm, dry and intact. No rash noted.  Psychiatric: Her speech is normal and behavior is normal. Judgment normal. Her mood appears not anxious. Cognition and memory are normal. She does not exhibit a depressed mood.          Assessment & Plan:

## 2014-02-08 NOTE — Progress Notes (Signed)
Pre visit review using our clinic review tool, if applicable. No additional management support is needed unless otherwise documented below in the visit note. 

## 2014-02-08 NOTE — Patient Instructions (Addendum)
Start nasal saline  Irrigation ( netty pot daily or nasal saline spray  2-3 times daily.  Continue allegra. Start and complete x 10 day of amoxicillin for sinus infection. Add mucinex or mucinex DM twice daily. Follow BP at home, if remaining > 140/90 after well let PCP know.

## 2014-02-08 NOTE — Assessment & Plan Note (Signed)
Cover with antibiotics given concern with symptoms off and on for months.   Treat with mucolytic and nasal saline irrigation.

## 2014-02-08 NOTE — Assessment & Plan Note (Signed)
Likely elevated dur to acute illness. Continue BP med, follow at home. See PCP if remaining up.

## 2014-02-26 ENCOUNTER — Encounter: Payer: Self-pay | Admitting: Family Medicine

## 2014-02-26 LAB — HM DEXA SCAN

## 2014-03-11 ENCOUNTER — Encounter: Payer: Self-pay | Admitting: Family Medicine

## 2014-03-25 ENCOUNTER — Encounter: Payer: Self-pay | Admitting: Family Medicine

## 2014-07-03 ENCOUNTER — Ambulatory Visit: Payer: Medicare Other | Attending: Otolaryngology

## 2014-07-03 ENCOUNTER — Encounter: Payer: Self-pay | Admitting: Physical Therapy

## 2014-07-03 VITALS — BP 130/44 | HR 80

## 2014-07-03 DIAGNOSIS — R42 Dizziness and giddiness: Secondary | ICD-10-CM | POA: Diagnosis not present

## 2014-07-03 DIAGNOSIS — R2689 Other abnormalities of gait and mobility: Secondary | ICD-10-CM

## 2014-07-03 NOTE — Patient Instructions (Signed)
Gaze Stabilization: Sitting   Keeping eyes on target on wall/thumb approximately 3 feet away, tilt head down 15-30 and move head side to side for _60  seconds. Do not perform vertical component. Repeat 3 times. Do _4__ sessions per day.   Copyright  VHI. All rights reserved.

## 2014-07-03 NOTE — Therapy (Signed)
[place] MAIN [facility] SERVICES [address] [place], [place], [address] Phone: [phone]   Fax:  [phone]  Physical Therapy Evaluation  Patient Details  Name: Sierra Bentley MRN: [id number] Date of Birth: 02-18-1933 Referring Provider:  Carloyn Manner, MD  Encounter Date: 07/03/2014      PT End of Session - 07/03/14 1630    Visit Number 1   Number of Visits 8   Date for PT Re-Evaluation 09/11/14   Authorization Type No g codes   PT Start Time 1300   PT Stop Time 1400   PT Time Calculation (min) 60 min   Equipment Utilized During Treatment Gait belt   Activity Tolerance Patient tolerated treatment well   Behavior During Therapy [facility] for tasks assessed/performed      Past Medical History  Diagnosis Date  . GERD (gastroesophageal reflux disease)   . HTN (hypertension)   . Osteopenia   . Rheumatoid arthritis(714.0)   . Urine incontinence   . Epistaxis   . Allergic rhinitis, cause unspecified   . Other malaise and fatigue   . Other and unspecified hyperlipidemia   . Cramp of limb   . Diverticulosis     Past Surgical History  Procedure Laterality Date  . Tonsillectomy    . Cystoscopy  2006  . Colonoscopy  2003  . Mole removal      rectal  . Breast biopsy  2/11    fibrocystic change  . Breast biopsy  8/11    fibrocystic change    Filed Vitals:   07/03/14 1413  BP: 130/44  Pulse: 80  SpO2: 99%    Visit Diagnosis:  Dizziness and giddiness - Plan: PT plan of care cert/re-cert  Balance disorder - Plan: PT plan of care cert/re-cert      Subjective Assessment - 07/03/14 1505    Subjective "I feel off balance"   Pertinent History Pt reports that she fell in October 2013 for an unknown reason. Since the fall she has noticed chronic difficulty with her balance and gait. She denies dizziness at the time of the fall but does state that approximately 1 year after her fall she started having dizziness. She initially  attributed it to her BP medications so she started taking her medication at night instead. Pt reports subsequent falls since that time and has had approximately 3 falls over the last 12 months. Pt describes her symptoms as "imbalance." On average her episodes occur multiple times/day but not every day. Pt states that when she wakes up she feels like something is "crazy in her head." Pt complains of aural fullness, worse on the L. Pt reports intermittent ear aches bilaterally but not lately. Pt denies tinnitus but reports prior history of tinnitus multiple years ago. Pt wears reading glasses but does not have prescription glasses. Pt reports history of headaches but was told that she doesn't have migraine headaches. She states she is allergic to "chocolate" but upon further questioning chocolates trigger headaches. No other known triggers. Denies dysphagia, diplopia, or drop attacks. Reports intermittent slurring (as reported by husband) but pt believes that he probably just can't hear well. Pt reports bilateral foot numbness at night, but worse on the L. Pt went to ENT and had VNG testing. Medical record reports 39% L ear weakness. Pt also with acute otitis externa. Otherwise no reported abnormalities. Pt denies recent imaging of brain.    Patient Stated Goals Decrease dizziness and improve function at home   Currently  in Pain? No/denies            [facility] PT Assessment - 07/03/14 0001    Assessment   Medical Diagnosis L vestibular decreased function   Onset Date/Surgical Date --  "years"   Next MD Visit Unknown   Prior Therapy No   Precautions   Precautions None   Restrictions   Weight Bearing Restrictions No   Balance Screen   Has the patient fallen in the past 6 months Yes   How many times? 3 falls in the last 12 months   Has the patient had a decrease in activity level because of a fear of falling?  No   Is the patient reluctant to leave their home because of a fear of falling?  No   Home  Ec[place] residence   Prior Function   Level of Independence Independent;Independent with gait;Independent with transfers   Cognition   Overall Cognitive Status Within Functional Limits for tasks assessed   Observation/Other Assessments   Observations No gross abnormalities noted   Activities of Balance Confidence Scale (ABC Scale)  64.37%   Dizziness Handicap Inventory [facility])  24/100            Vestibular Assessment - 07/03/14 0001    Symptom Behavior   Type of Dizziness Blurred vision  Denies vertigo   Frequency of Dizziness Multiple times/day, multiple days/week. Not everday   Duration of Dizziness seconds to minutes   Aggravating Factors Turning body quickly;Sit to stand;Forward bending;Turning head quickly;Looking up to the ceiling;Spontaneous onset   Relieving Factors Head stationary  "hold onto someting"   Occulomotor Exam   Occulomotor Alignment Normal   Spontaneous Absent   Gaze-induced Absent   Head shaking Horizontal Absent   Smooth Pursuits Saccades   Saccades Intact   Vestibulo-Occular Reflex   VOR 1 Head Only (x 1 viewing) Positive for dizziness, denies blurring. Positive VOR head thrust to the L, negative to the R.    Positional Testing   Dix-Hallpike Dix-Hallpike Right;Dix-Hallpike Left   Horizontal Canal Testing Horizontal Canal Right;Horizontal Canal Left   Dix-Hallpike Right   Dix-Hallpike Right Duration Ageotrophic horizontal    Dix-Hallpike Right Symptoms Left nystagmus  worsen's with L gaze, improves with R gaze   Dix-Hallpike Left   Dix-Hallpike Left Duration Negative   Dix-Hallpike Left Symptoms No nystagmus   Horizontal Canal Right   Horizontal Canal Right Duration Negative   Horizontal Canal Right Symptoms Normal   Horizontal Canal Left   Horizontal Canal Left Duration Negative   Horizontal Canal Left Symptoms Normal   Cognition   Cognition Orientation Level Oriented x 4   Orthostatics   BP sitting 130/44  mmHg   HR sitting 80      EXAMINATION: Strength grossly 4+/5 throughout UE/LE with the exception of 4-/5 L shoulder flexion which is a chronic issue. Limited neuro screen in WNL. Firm/foam testing: >30 in all positions except 9.3 seconds Romberg foam eyes closed.                 PT Education - 07/03/14 1630    Education provided Yes   Education Details See pt instructions   Person(s) Educated Patient   Methods Explanation;Demonstration;Tactile cues;Verbal cues;Handout   Comprehension Verbalized understanding;Returned demonstration             PT Long Term Goals - 07/03/14 1646    PT LONG TERM GOAL #1   Title Pt will decrease DHI to <10/100 in order  to demonstrate improved symptoms and increase function at home by 09/11/14   Status New   PT LONG TERM GOAL #2   Title Pt will improve ABC to >67% in order to demonstrate improved balance confidence and decreased fall risk by 09/11/14   Status New   PT LONG TERM GOAL #3   Title Pt will decrease symptomatic dizziness to <5/10 with aggravating activities in order to decrease risk for falls by 09/11/14   PT LONG TERM GOAL #4   Title Pt will be independent with HEP in order to decrease symptoms and improve function at home by 09/11/14   Status New               Plan - 07/03/14 1633    Clinical Impression Statement Pt referred for L 39% caloric weakness. PT examination reveals positive VOR and positive VOR head thrust to the L. Dix hallpike and horizontal canal testing negative for BPPV. R Dix hallpike position elicits ageotrophic horizontal nystagmus consistent with unilateral hypofunction. DHI: 24/100, ABC: 64.37%. Foam Romberg eyes closed: of 9.3 seconds. Otherwise strength and limited neurological screening are WNL. Pt will benefit from skilled PT services to address vestibular deficits in order to decrease symptoms and return to prior level of function at home.   Pt will benefit from skilled therapeutic intervention in  order to improve on the following deficits Decreased balance;Dizziness   Rehab Potential Good   Clinical Impairments Affecting Rehab Potential Postiive: motivation, Negative chronicity   PT Frequency 1x / week   PT Duration 8 weeks   PT Treatment/Interventions Canalith Repostioning;DME Instruction;Gait training;Stair training;Therapeutic activities;Therapeutic exercise;Balance training;Neuromuscular re-education;Vestibular   PT Next Visit Plan PHQ-9, MOCA, BERG, and DGI. Progress VOR x 1 horizontal exercises and issue balance exercises to HEP as indicated   PT Home Exercise Plan VOR x 1 horizontal in sitting   Consulted and Agree with Plan of Care Patient         Problem List Patient Active Problem List   Diagnosis Date Noted  . Acute sinus infection 02/08/2014  . Vaccine reaction 08/03/2013  . Encounter for Medicare annual wellness exam 07/25/2013  . Varicosities of leg 05/23/2012  . Skin lesion of right lower limb 05/23/2012  . Abnormal ultrasound of thyroid gland 03/01/2011  . HYPERLIPIDEMIA 06/11/2008  . ALLERGIC RHINITIS 03/29/2008  . Essential hypertension, benign 12/26/2006  . GERD 12/26/2006  . RHEUMATOID ARTHRITIS 12/26/2006  . OSTEOPENIA 12/26/2006  . URINARY INCONTINENCE 12/26/2006    Rocio Roam 07/03/2014, 4:54 PM  [place] MAIN [facility] SERVICES [address] [place], [place], [address] Phone: [phone]   Fax:  [phone]

## 2014-07-15 ENCOUNTER — Ambulatory Visit: Payer: Medicare Other | Admitting: Physical Therapy

## 2014-07-15 DIAGNOSIS — R42 Dizziness and giddiness: Secondary | ICD-10-CM

## 2014-07-15 DIAGNOSIS — R2689 Other abnormalities of gait and mobility: Secondary | ICD-10-CM

## 2014-07-16 ENCOUNTER — Encounter: Payer: Self-pay | Admitting: Physical Therapy

## 2014-07-16 NOTE — Therapy (Signed)
[place] MAIN [facility] SERVICES [address] [place], [place], [address] Phone: [phone]   Fax:  [phone]  Physical Therapy Treatment  Patient Details  Name: Sierra Bentley MRN: [id number] Date of Birth: 31-Dec-1933 Referring Provider:  Abner Greenspan, MD  Encounter Date: 07/15/2014      PT End of Session - 07/15/14 1110    Visit Number 2   Number of Visits 8   Date for PT Re-Evaluation 09/11/14   Authorization Type No g codes   PT Start Time 04/25/1105   PT Stop Time 1152   PT Time Calculation (min) 45 min   Equipment Utilized During Treatment Gait belt   Activity Tolerance Patient tolerated treatment well   Behavior During Therapy [facility] for tasks assessed/performed      Past Medical History  Diagnosis Date  . GERD (gastroesophageal reflux disease)   . HTN (hypertension)   . Osteopenia   . Rheumatoid arthritis(714.0)   . Urine incontinence   . Epistaxis   . Allergic rhinitis, cause unspecified   . Other malaise and fatigue   . Other and unspecified hyperlipidemia   . Cramp of limb   . Diverticulosis     Past Surgical History  Procedure Laterality Date  . Tonsillectomy    . Cystoscopy  2004-04-25  . Colonoscopy  04/25/2001  . Mole removal      rectal  . Breast biopsy  2/11    fibrocystic change  . Breast biopsy  8/11    fibrocystic change    There were no vitals filed for this visit.  Visit Diagnosis:  Dizziness and giddiness  Balance disorder      Subjective Assessment - 07/15/14 1110    Subjective Pt states she has been on vacation and did not do her exercises. Pt states she did start doing her exercises a few days ago but states her neck is sore now,    Pertinent History Pt reports that she fell in October 2013 for an unknown reason. Since the fall she has noticed chronic difficulty with her balance and gait. She denies dizziness at the time of the fall but does state that approximately 1 year after her fall she started  having dizziness. She initially attributed it to her BP medications so she started taking her medication at night instead. Pt reports subsequent falls since that time and has had approximately 3 falls over the last 12 months. Pt describes her symptoms as "imbalance." On average her episodes occur multiple times/day but not every day. Pt states that when she wakes up she feels like something is "crazy in her head." Pt complains of aural fullness, worse on the L. Pt reports intermittent ear aches bilaterally but not lately. Pt denies tinnitus but reports prior history of tinnitus multiple years ago. Pt wears reading glasses but does not have prescription glasses. Pt reports history of headaches but was told that she doesn't have migraine headaches. She states she is allergic to "chocolate" but upon further questioning chocolates trigger headaches. No other known triggers. Denies dysphagia, diplopia, or drop attacks. Reports intermittent slurring (as reported by husband) but pt believes that he probably just can't hear well. Pt reports bilateral foot numbness at night, but worse on the L. Pt went to ENT and had VNG testing. Medical record reports 39% L ear weakness. Pt also with acute otitis externa. Otherwise no reported abnormalities. Pt denies recent imaging of brain.    Patient Stated Goals Decrease dizziness and  improve function at home            Neuromuscular Reeducation: Performed VOR X 1 in sitting demonstrating fair technique; suggested and demonstrated mild chin tuck while performing exercise and pt demonstrated ability to perform. Pt performed VOR X 1 3 reps of 1 minute each in sitting. Pt reports mild dizziness with VOR activity. Performed feet together with head turns horiz and vert on firm and foam surfaces and semitandem stance (heel toe with about 1-2" space between feet) with alternate lead leg with body turns on firm and then on Airex pad; multiple 30 sec -1 minute holds of  each. Ambulation forward/backward with horiz head turns 1 rep each 60'. Performed static stance while tossing ball to self horiz and then vert while tracking ball with head and eyes. Then, pt ambulated 63' while attempting to do ball toss to self horiz. Pt had difficulty with self tossing and catching the ball. Pt reports 6/10 dizziness with amb activities and required two short standing rest breaks, less than one minute each.  EC on firm 30 seconds with feet 1-2" apart and semitandem stance with alt lead leg 30 second holds with EC on firm surface.                Vestibular Treatment/Exercise - 07/15/14 1110    Vestibular Treatment/Exercise   Gaze Exercises X1 Viewing Horizontal               PT Education - 07/15/14 1110    Education provided Yes   Education Details Pt educated as to semitandem stance progressions with alternate lead leg with body turns on pillow/foam pad and feet together with head turns horizontal and vertical on pillow/foam pad. Handout provided.   Person(s) Educated Patient   Methods Explanation;Demonstration;Handout   Comprehension Verbalized understanding;Returned demonstration             PT Long Term Goals - 07/03/14 1646    PT LONG TERM GOAL #1   Title Pt will decrease DHI to <10/100 in order to demonstrate improved symptoms and increase function at home by 09/11/14   Status New   PT LONG TERM GOAL #2   Title Pt will improve ABC to >67% in order to demonstrate improved balance confidence and decreased fall risk by 09/11/14   Status New   PT LONG TERM GOAL #3   Title Pt will decrease symptomatic dizziness to <5/10 with aggravating activities in order to decrease risk for falls by 09/11/14   PT LONG TERM GOAL #4   Title Pt will be independent with HEP in order to decrease symptoms and improve function at home by 09/11/14   Status New               Plan - 07/15/14 1110    Clinical Impression Statement Pt does well with balance  activities on firm ground but is challenged by narrow BOS activities on Airex pad and with walking with head turn activities. Pt would benefit from continued PT services to work on progressios of gaze stability, vestibular and high level balance exercises in order to hep patient return to her prior level of function and to decrease her subjective symptoms.    Pt will benefit from skilled therapeutic intervention in order to improve on the following deficits Decreased balance;Dizziness   Rehab Potential Good   Clinical Impairments Affecting Rehab Potential Postiive: motivation, Negative chronicity   PT Frequency 1x / week   PT Duration 8 weeks   PT  Treatment/Interventions Canalith Repostioning;DME Instruction;Gait Scientist, forensic;Therapeutic activities;Therapeutic exercise;Balance training;Neuromuscular re-education;Vestibular   PT Next Visit Plan Plan on reviewing HEP; progressing ambulation with head turn and body turn activities, standing VOR exercise.   PT Home Exercise Plan VOR x 1 horizontal in sitting, semitandem stance progressions with body turns and feet together with horizontal and vertical head turns on pillow/foam surface.   Consulted and Agree with Plan of Care Patient        Problem List Patient Active Problem List   Diagnosis Date Noted  . Acute sinus infection 02/08/2014  . Vaccine reaction 08/03/2013  . Encounter for Medicare annual wellness exam 07/25/2013  . Varicosities of leg 05/23/2012  . Skin lesion of right lower limb 05/23/2012  . Abnormal ultrasound of thyroid gland 03/01/2011  . HYPERLIPIDEMIA 06/11/2008  . ALLERGIC RHINITIS 03/29/2008  . Essential hypertension, benign 12/26/2006  . GERD 12/26/2006  . RHEUMATOID ARTHRITIS 12/26/2006  . OSTEOPENIA 12/26/2006  . URINARY INCONTINENCE 12/26/2006    Lady Deutscher PT, DPT Lady Deutscher 07/16/2014, 8:42 AM  [place] MAIN [facility] SERVICES [address]  [place], [place], [address] Phone: [phone]   Fax:  [phone]

## 2014-07-22 ENCOUNTER — Encounter: Payer: Self-pay | Admitting: Physical Therapy

## 2014-07-22 ENCOUNTER — Ambulatory Visit: Payer: Medicare Other | Admitting: Physical Therapy

## 2014-07-22 DIAGNOSIS — R42 Dizziness and giddiness: Secondary | ICD-10-CM

## 2014-07-22 DIAGNOSIS — R2689 Other abnormalities of gait and mobility: Secondary | ICD-10-CM

## 2014-07-22 NOTE — Therapy (Signed)
[place] MAIN [facility] [address] SERVICES [address] [place], [place], [address] Phone: [phone]   Fax:  [phone]  Physical Therapy Treatment  Patient Details  Name: Sierra Bentley MRN: [id number] Date of Birth: 08-31-1933 Referring Provider:  Carloyn Manner, MD  Encounter Date: 07/22/2014      PT End of Session - 07/22/14 1516    Visit Number 3   Number of Visits 8   Date for PT Re-Evaluation 09/11/14   Authorization Type No g codes   PT Start Time 9476   PT Stop Time 1503   PT Time Calculation (min) 43 min   Equipment Utilized During Treatment Gait belt   Activity Tolerance Patient tolerated treatment well   Behavior During Therapy [facility] for tasks assessed/performed      Past Medical History  Diagnosis Date  . GERD (gastroesophageal reflux disease)   . HTN (hypertension)   . Osteopenia   . Rheumatoid arthritis(714.0)   . Urine incontinence   . Epistaxis   . Allergic rhinitis, cause unspecified   . Other malaise and fatigue   . Other and unspecified hyperlipidemia   . Cramp of limb   . Diverticulosis     Past Surgical History  Procedure Laterality Date  . Tonsillectomy    . Cystoscopy  2006  . Colonoscopy  2003  . Mole removal      rectal  . Breast biopsy  2/11    fibrocystic change  . Breast biopsy  8/11    fibrocystic change    There were no vitals filed for this visit.  Visit Diagnosis:  Dizziness and giddiness  Balance disorder      Subjective Assessment - 07/22/14 1512    Subjective Pt states that she feels about the same. Pt states she feels "a little dizzy today, but I think it is my eyes." Pt states she feels congested due to allergies which she feels affects her vision. Pt states her neck has not been hurting since making the adjustment of doing slight chin tuck with the VOR exercise.    Pertinent History Pt reports that she fell in October 2013 for an unknown reason. Since the fall she has noticed  chronic difficulty with her balance and gait. She denies dizziness at the time of the fall but does state that approximately 1 year after her fall she started having dizziness. She initially attributed it to her BP medications so she started taking her medication at night instead. Pt reports subsequent falls since that time and has had approximately 3 falls over the last 12 months. Pt describes her symptoms as "imbalance." On average her episodes occur multiple times/day but not every day. Pt states that when she wakes up she feels like something is "crazy in her head." Pt complains of aural fullness, worse on the L. Pt reports intermittent ear aches bilaterally but not lately. Pt denies tinnitus but reports prior history of tinnitus multiple years ago. Pt wears reading glasses but does not have prescription glasses. Pt reports history of headaches but was told that she doesn't have migraine headaches. She states she is allergic to "chocolate" but upon further questioning chocolates trigger headaches. No other known triggers. Denies dysphagia, diplopia, or drop attacks. Reports intermittent slurring (as reported by husband) but pt believes that he probably just can't hear well. Pt reports bilateral foot numbness at night, but worse on the L. Pt went to ENT and had VNG testing. Medical record reports 39% L ear weakness.  Pt also with acute otitis externa. Otherwise no reported abnormalities. Pt denies recent imaging of brain.    Currently in Pain? --  none stated; c/o mild dizziness this date and imbalance     Neuromuscular Re-education:  VOR exercise: In standing, pt performed VOR X 1 horiz 3 reps of 1 minute each two on Airex pad and one on firm surface. Pt required one cue to try to turn head smoothly side to side.  Airex pad: Reviewed HEP on Airex pad, performed feet together with horiz and vert head turns and semi-tandem progressions with alternate lead leg with body turns. Performed slow marching on  Airex pad about 10 reps each leg with numerous small LOB with pt reaching to touch bars for support. Performed side stepping onto, over and return without UEs support 5 reps. Pt was able to perform this without LOB.  Cone tapping: On firm surface and then on AirEx pad, pt performed alternate foot cone tapping in series of one and two cones as called out by therapist.  On Airex balance beam: Pt performed sidestepping L/R without head turns and then with horiz and then vert head turns 4 reps times 5' each. Performed on firm surface and then on Airex balance beam tandem walking 5' times 4 reps.   Pt performed 25 reps toe raises with few finger support for balance. Pt performed toe walking and then heel walking 6' each in // bars with pt reaching occasionally (2-3 times) for support due to LOB.        PT Education - 07/22/14 1515    Education provided Yes   Education Details Reviewed HEP and discussed tandem stance progressions.    Person(s) Educated Patient   Methods Demonstration;Explanation;Verbal cues   Comprehension Verbalized understanding            PT Long Term Goals - 07/03/14 1646    PT LONG TERM GOAL #1   Title Pt will decrease DHI to <10/100 in order to demonstrate improved symptoms and increase function at home by 09/11/14   Status New   PT LONG TERM GOAL #2   Title Pt will improve ABC to >67% in order to demonstrate improved balance confidence and decreased fall risk by 09/11/14   Status New   PT LONG TERM GOAL #3   Title Pt will decrease symptomatic dizziness to <5/10 with aggravating activities in order to decrease risk for falls by 09/11/14   PT LONG TERM GOAL #4   Title Pt will be independent with HEP in order to decrease symptoms and improve function at home by 09/11/14   Status New           Plan - 07/22/14 1517    Clinical Impression Statement Pt continues to be challenged by narrow BOS activities on Airex pad. Pt did well with progressing to VOR X 1 on Airex  pad and demonstrates improved technique. Pt would benefit from continued PT services to work on progressions of vestibular and balance exercises to work towards goals as set on POC.    Pt will benefit from skilled therapeutic intervention in order to improve on the following deficits Decreased balance;Dizziness   Rehab Potential Good   Clinical Impairments Affecting Rehab Potential Postive: motivation, Negative chronicity   PT Frequency 1x / week   PT Duration 8 weeks   PT Treatment/Interventions Canalith Repostioning;DME Instruction;Gait training;Stair training;Therapeutic activities;Therapeutic exercise;Balance training;Neuromuscular re-education;Vestibular   PT Next Visit Plan Consider trying rockerboard exercise activities, hallway ball toss activity and  progressions of activites in Airex pad.    PT Home Exercise Plan VOR x 1 horizontal in sitting, semitandem stance progressions with body turns and feet together with horizontal and vertical head turns on pillow/foam surface and tandem walking along countertop.    Consulted and Agree with Plan of Care Patient       Problem List Patient Active Problem List   Diagnosis Date Noted  . Acute sinus infection 02/08/2014  . Vaccine reaction 08/03/2013  . Encounter for Medicare annual wellness exam 07/25/2013  . Varicosities of leg 05/23/2012  . Skin lesion of right lower limb 05/23/2012  . Abnormal ultrasound of thyroid gland 03/01/2011  . HYPERLIPIDEMIA 06/11/2008  . ALLERGIC RHINITIS 03/29/2008  . Essential hypertension, benign 12/26/2006  . GERD 12/26/2006  . RHEUMATOID ARTHRITIS 12/26/2006  . OSTEOPENIA 12/26/2006  . URINARY INCONTINENCE 12/26/2006   Lady Deutscher PT, DPT   Lady Deutscher 07/22/2014, 3:21 PM  [place] MAIN [facility] SERVICES [address] [place], [place], [address] Phone: [phone]   Fax:  [phone]

## 2014-08-02 ENCOUNTER — Ambulatory Visit: Payer: Medicare Other | Attending: Family Medicine

## 2014-08-02 VITALS — BP 119/45 | HR 84

## 2014-08-02 DIAGNOSIS — R42 Dizziness and giddiness: Secondary | ICD-10-CM | POA: Insufficient documentation

## 2014-08-02 DIAGNOSIS — R2681 Unsteadiness on feet: Secondary | ICD-10-CM | POA: Diagnosis not present

## 2014-08-02 DIAGNOSIS — R2689 Other abnormalities of gait and mobility: Secondary | ICD-10-CM

## 2014-08-02 NOTE — Therapy (Signed)
[place] MAIN [facility] SERVICES [address] [place], [place], [address] Phone: [phone]   Fax:  [phone]  Physical Therapy Treatment  Patient Details  Name: Sierra Bentley MRN: [id number] Date of Birth: 1933-08-27 Referring Provider:  Abner Greenspan, MD  Encounter Date: 08/02/2014      PT End of Session - 08/02/14 1137    Visit Number 4   Number of Visits 8   Date for PT Re-Evaluation 09/11/14   Authorization Type No g codes   PT Start Time 1975   PT Stop Time 1204   PT Time Calculation (min) 38 min   Equipment Utilized During Treatment Gait belt   Activity Tolerance Patient tolerated treatment well   Behavior During Therapy [facility] for tasks assessed/performed      Past Medical History  Diagnosis Date  . GERD (gastroesophageal reflux disease)   . HTN (hypertension)   . Osteopenia   . Rheumatoid arthritis(714.0)   . Urine incontinence   . Epistaxis   . Allergic rhinitis, cause unspecified   . Other malaise and fatigue   . Other and unspecified hyperlipidemia   . Cramp of limb   . Diverticulosis     Past Surgical History  Procedure Laterality Date  . Tonsillectomy    . Cystoscopy  2006  . Colonoscopy  2003  . Mole removal      rectal  . Breast biopsy  2/11    fibrocystic change  . Breast biopsy  8/11    fibrocystic change    Filed Vitals:   08/02/14 1130  BP: 119/45  Pulse: 84    Visit Diagnosis:  Dizziness and giddiness  Balance disorder      Subjective Assessment - 08/02/14 1128    Subjective Pt reports that her dizziness is improving. "I don't feel as good with my balance when I'm walking."  Pt reports "I didn't do well with my exercises." She states that she was very busy and was unable to do her exercises at all last week. No specific questions or concerns at this time.    Pertinent History Pt reports that she fell in October 2013 for an unknown reason. Since the fall she has noticed chronic difficulty  with her balance and gait. She denies dizziness at the time of the fall but does state that approximately 1 year after her fall she started having dizziness. She initially attributed it to her BP medications so she started taking her medication at night instead. Pt reports subsequent falls since that time and has had approximately 3 falls over the last 12 months. Pt describes her symptoms as "imbalance." On average her episodes occur multiple times/day but not every day. Pt states that when she wakes up she feels like something is "crazy in her head." Pt complains of aural fullness, worse on the L. Pt reports intermittent ear aches bilaterally but not lately. Pt denies tinnitus but reports prior history of tinnitus multiple years ago. Pt wears reading glasses but does not have prescription glasses. Pt reports history of headaches but was told that she doesn't have migraine headaches. She states she is allergic to "chocolate" but upon further questioning chocolates trigger headaches. No other known triggers. Denies dysphagia, diplopia, or drop attacks. Reports intermittent slurring (as reported by husband) but pt believes that he probably just can't hear well. Pt reports bilateral foot numbness at night, but worse on the L. Pt went to ENT and had VNG testing. Medical record reports 39%  L ear weakness. Pt also with acute otitis externa. Otherwise no reported abnormalities. Pt denies recent imaging of brain.    Patient Stated Goals Decrease dizziness and improve function at home   Currently in Pain? No/denies        VOR exercise: In standing, pt performed VOR X 1 horiz 3 reps of 1 minute on Airex pad in narrow stance (4/10 dizziness). Pt requires cues to speed up head turns as well as to improve smoothness of motion.    Airex pad: Feet together with horiz and vert head turns and semi-tandem progressions with alternate lead leg with body turns. Performed slow marching on Airex pad about 10 reps each leg with  numerous small LOB with pt reaching to touch bars for support.   Cone tapping: On firm surface and then on Airex pad, pt performed alternate foot cone tapping one cone as called out by therapist.   Rocker board: R/L and well as A/P orientation with horizontal and vertical head turns followed by body turns; Pt struggles significantly with R/L orientation so abbreviated.  Education about HEP importance. Pt also educated in response to questions about goals of vestibular and balance therapy and how it will help improve her balance. Unable to attempt hallway ball toss activities so will attempt at next session.                          PT Education - 08/02/14 1136    Education provided Yes   Education Details HEP reinforcement and progression   Person(s) Educated Patient   Methods Explanation;Demonstration   Comprehension Verbalized understanding;Returned demonstration             PT Long Term Goals - 07/03/14 1646    PT LONG TERM GOAL #1   Title Pt will decrease DHI to <10/100 in order to demonstrate improved symptoms and increase function at home by 09/11/14   Status New   PT LONG TERM GOAL #2   Title Pt will improve ABC to >67% in order to demonstrate improved balance confidence and decreased fall risk by 09/11/14   Status New   PT LONG TERM GOAL #3   Title Pt will decrease symptomatic dizziness to <5/10 with aggravating activities in order to decrease risk for falls by 09/11/14   PT LONG TERM GOAL #4   Title Pt will be independent with HEP in order to decrease symptoms and improve function at home by 09/11/14   Status New               Plan - 08/02/14 1138    Clinical Impression Statement Pt continues to demonstrate poor balance, especially on compliant surfaces. She struggles with rocker board R/L balance. Education provided about importance of HEP and consistency. Minimal HEP progression provided due to lack of compliance with current program. Pt  encouraged to follow-up as scheduled.    Pt will benefit from skilled therapeutic intervention in order to improve on the following deficits Decreased balance;Dizziness   Rehab Potential Good   Clinical Impairments Affecting Rehab Potential Postive: motivation, Negative chronicity   PT Frequency 1x / week   PT Duration 8 weeks   PT Treatment/Interventions Canalith Repostioning;DME Instruction;Gait training;Stair training;Therapeutic activities;Therapeutic exercise;Balance training;Neuromuscular re-education;Vestibular   PT Next Visit Plan Continue with rockerboard exercises. VOR progressions. Attempt ball toss activity in hallway and progress Airex balance. Reinforce importance of HEP   PT Home Exercise Plan VOR x 1 horizontal in narrow stance, semitandem  stance progressions with body turns and feet together with horizontal and vertical head turns on pillow/foam surface and tandem walking along countertop.    Consulted and Agree with Plan of Care Patient        Problem List Patient Active Problem List   Diagnosis Date Noted  . Acute sinus infection 02/08/2014  . Vaccine reaction 08/03/2013  . Encounter for Medicare annual wellness exam 07/25/2013  . Varicosities of leg 05/23/2012  . Skin lesion of right lower limb 05/23/2012  . Abnormal ultrasound of thyroid gland 03/01/2011  . HYPERLIPIDEMIA 06/11/2008  . ALLERGIC RHINITIS 03/29/2008  . Essential hypertension, benign 12/26/2006  . GERD 12/26/2006  . RHEUMATOID ARTHRITIS 12/26/2006  . OSTEOPENIA 12/26/2006  . URINARY INCONTINENCE 12/26/2006   Phillips Grout PT, DPT   Huprich,Jason 08/02/2014, 1:16 PM  [place] MAIN [facility] SERVICES [address] [place], [place], [address] Phone: [phone]   Fax:  [phone]

## 2014-08-06 ENCOUNTER — Ambulatory Visit: Payer: Medicare Other | Admitting: Physical Therapy

## 2014-08-06 ENCOUNTER — Encounter: Payer: Self-pay | Admitting: Physical Therapy

## 2014-08-06 DIAGNOSIS — R42 Dizziness and giddiness: Secondary | ICD-10-CM | POA: Diagnosis not present

## 2014-08-06 DIAGNOSIS — R2689 Other abnormalities of gait and mobility: Secondary | ICD-10-CM

## 2014-08-06 NOTE — Therapy (Signed)
[place] MAIN [facility] SERVICES [address] [place], [place], [address] Phone: [phone]   Fax:  [phone]  Physical Therapy Treatment  Patient Details  Name: Sierra Bentley MRN: [id number] Date of Birth: 1933-06-19 Referring Provider:  Abner Greenspan, MD  Encounter Date: 08/06/2014      PT End of Session - 08/06/14 1351    Visit Number 5   Number of Visits 8   Date for PT Re-Evaluation 09/11/14   Authorization Type No g codes   PT Start Time 9323   PT Stop Time 1434   PT Time Calculation (min) 43 min   Equipment Utilized During Treatment Gait belt   Activity Tolerance Patient tolerated treatment well   Behavior During Therapy [facility] for tasks assessed/performed      Past Medical History  Diagnosis Date  . GERD (gastroesophageal reflux disease)   . HTN (hypertension)   . Osteopenia   . Rheumatoid arthritis(714.0)   . Urine incontinence   . Epistaxis   . Allergic rhinitis, cause unspecified   . Other malaise and fatigue   . Other and unspecified hyperlipidemia   . Cramp of limb   . Diverticulosis     Past Surgical History  Procedure Laterality Date  . Tonsillectomy    . Cystoscopy  2006  . Colonoscopy  2003  . Mole removal      rectal  . Breast biopsy  2/11    fibrocystic change  . Breast biopsy  8/11    fibrocystic change    There were no vitals filed for this visit.  Visit Diagnosis:  Dizziness and giddiness  Balance disorder      Subjective Assessment - 08/06/14 1353    Subjective Pt reports that she is still not good on her balance but states that the dizziness has improved. Pt states she noticed that when she turns now she is not getting dizzy.       Neuromuscular Reeducation:    VOR exercise: In standing on Airex pad, patient performed VOR X1 horiz 3 reps (one with feet together and the other two with feet apart) of 1 minute each.  2" X 4" board:   On 2" X 4" board worked on static sideways  stance with clock arm reaching. On 2" X 4" board worked on sidestepping L/R  8' times 6 reps of each type  Ball toss over shoulder: Pt ambulated 170' forward and then retro while tossing ball over one shoulder with return catch over opposite shoulder.  Hallway ball toss: In hallway, worked on ball toss against one wall with alternating quick turns to toss ball against opposite wall two sets of 3-4 minutes each.  Rockerboard: On medium wooden rocker board, worked on [employer] and A/P sways. Pt with a few small LOB that she was able to self-correct by touching // bar.  Hallway Ambulation while scanning for targets: Pt ambulated 175' forward and then 175' retro ambulation while scanning for targets on wall on L and R side.   Note: pt reported 2-3/10 dizziness with rocker board and hallway ball toss activities. Pt denied dizziness with ambulation while scanning for targets and with ambulation with ball toss over shoulder activities.        PT Education - 08/06/14 1437    Education provided Yes   Education Details HEP reinforcement   Person(s) Educated Patient   Methods Explanation;Demonstration   Comprehension Verbalized understanding;Returned demonstration  PT Long Term Goals - 07/03/14 1646    PT LONG TERM GOAL #1   Title Pt will decrease DHI to <10/100 in order to demonstrate improved symptoms and increase function at home by 09/11/14   Status New   PT LONG TERM GOAL #2   Title Pt will improve ABC to >67% in order to demonstrate improved balance confidence and decreased fall risk by 09/11/14   Status New   PT LONG TERM GOAL #3   Title Pt will decrease symptomatic dizziness to <5/10 with aggravating activities in order to decrease risk for falls by 09/11/14   PT LONG TERM GOAL #4   Title Pt will be independent with HEP in order to decrease symptoms and improve function at home by 09/11/14   Status New               Plan - 08/06/14 1439    Clinical Impression  Statement Pt progressing with therapy and reporting a decrease in her subjective symptoms of dizziness. Pt continues to be challenged by rocker board and narrow BOS activities. Pt did well with ambulation with head turn activities this date. It should be noted that patient reports that she did her home exercise program only a few times this past week which might be impacting her progression.    Pt will benefit from skilled therapeutic intervention in order to improve on the following deficits Decreased balance;Dizziness   Rehab Potential Good   Clinical Impairments Affecting Rehab Potential Postive: motivation, Negative chronicity   PT Frequency 1x / week   PT Duration 8 weeks   PT Treatment/Interventions Canalith Repostioning;DME Instruction;Gait training;Stair training;Therapeutic activities;Therapeutic exercise;Balance training;Neuromuscular re-education;Vestibular   PT Next Visit Plan Consider repeating rockerboard and 2" X 4" activities next session. Will repeat functional outcome measures in next few visits.    PT Home Exercise Plan VOR x 1 horizontal in narrow stance, semitandem stance progressions with body turns and feet together with horizontal and vertical head turns on pillow/foam surface and tandem walking along countertop.    Consulted and Agree with Plan of Care Patient        Problem List Patient Active Problem List   Diagnosis Date Noted  . Acute sinus infection 02/08/2014  . Vaccine reaction 08/03/2013  . Encounter for Medicare annual wellness exam 07/25/2013  . Varicosities of leg 05/23/2012  . Skin lesion of right lower limb 05/23/2012  . Abnormal ultrasound of thyroid gland 03/01/2011  . HYPERLIPIDEMIA 06/11/2008  . ALLERGIC RHINITIS 03/29/2008  . Essential hypertension, benign 12/26/2006  . GERD 12/26/2006  . RHEUMATOID ARTHRITIS 12/26/2006  . OSTEOPENIA 12/26/2006  . URINARY INCONTINENCE 12/26/2006   Lady Deutscher PT, DPT Lady Deutscher 08/06/2014, 2:51  PM  [place] MAIN [facility] SERVICES [address] [place], [place], [address] Phone: [phone]   Fax:  [phone]

## 2014-08-14 ENCOUNTER — Ambulatory Visit (INDEPENDENT_AMBULATORY_CARE_PROVIDER_SITE_OTHER): Payer: Medicare Other | Admitting: Family Medicine

## 2014-08-14 ENCOUNTER — Telehealth: Payer: Self-pay | Admitting: Family Medicine

## 2014-08-14 ENCOUNTER — Ambulatory Visit
Admission: RE | Admit: 2014-08-14 | Discharge: 2014-08-14 | Disposition: A | Discharge status: Home / Self Care | Payer: Medicare Other | Source: Ambulatory Visit | Attending: Family Medicine | Admitting: Family Medicine

## 2014-08-14 ENCOUNTER — Encounter: Payer: Self-pay | Admitting: Family Medicine

## 2014-08-14 VITALS — BP 126/62 | HR 78 | Temp 98.4°F | Wt 151.0 lb

## 2014-08-14 DIAGNOSIS — W1809XA Striking against other object with subsequent fall, initial encounter: Secondary | ICD-10-CM | POA: Diagnosis not present

## 2014-08-14 DIAGNOSIS — M546 Pain in thoracic spine: Secondary | ICD-10-CM | POA: Diagnosis not present

## 2014-08-14 DIAGNOSIS — S0083XA Contusion of other part of head, initial encounter: Secondary | ICD-10-CM

## 2014-08-14 DIAGNOSIS — R6884 Jaw pain: Secondary | ICD-10-CM | POA: Insufficient documentation

## 2014-08-14 NOTE — Telephone Encounter (Signed)
Pt would like to have cpe sooner than November, is there any way she can be scheduled in a regular 30 min slot?

## 2014-08-14 NOTE — Patient Instructions (Signed)
Be careful not to fall -but stay active  Stop at check out so we can organize an xray at [facility] of your jaw Use ice on your jaw Use heat on your back  Aleve is ok as needed

## 2014-08-14 NOTE — Progress Notes (Signed)
Pre visit review using our clinic review tool, if applicable. No additional management support is needed unless otherwise documented below in the visit note. 

## 2014-08-14 NOTE — Progress Notes (Signed)
Subjective:    Patient ID: Sierra Bentley, female    DOB: 14-Aug-1933, 81 y.o.   MRN: [id number]  HPI Here for f/u of a fall -- this was 2/54   She was "frolicking" in the bathroom and then slipped and hit her R jaw on the edge of the sink  Twisted her back - hit on the L side  Hit her R shoulder  Landed on her behind    Pain - L side of mid back  Also R jaw (no broken teeth or tooth pain)  R shoulder is bruised but not painful   Aleve did help for several days   Getting better but still sore   Hx of osteopenia   Patient Active Problem List   Diagnosis Date Noted  . Acute sinus infection 02/08/2014  . Vaccine reaction 08/03/2013  . Encounter for Medicare annual wellness exam 07/25/2013  . Varicosities of leg 05/23/2012  . Skin lesion of right lower limb 05/23/2012  . Abnormal ultrasound of thyroid gland 03/01/2011  . HYPERLIPIDEMIA 06/11/2008  . ALLERGIC RHINITIS 03/29/2008  . Essential hypertension, benign 12/26/2006  . GERD 12/26/2006  . RHEUMATOID ARTHRITIS 12/26/2006  . OSTEOPENIA 12/26/2006  . URINARY INCONTINENCE 12/26/2006   Past Medical History  Diagnosis Date  . GERD (gastroesophageal reflux disease)   . HTN (hypertension)   . Osteopenia   . Rheumatoid arthritis(714.0)   . Urine incontinence   . Epistaxis   . Allergic rhinitis, cause unspecified   . Other malaise and fatigue   . Other and unspecified hyperlipidemia   . Cramp of limb   . Diverticulosis    Past Surgical History  Procedure Laterality Date  . Tonsillectomy    . Cystoscopy  2006  . Colonoscopy  2003  . Mole removal      rectal  . Breast biopsy  2/11    fibrocystic change  . Breast biopsy  8/11    fibrocystic change   History  Substance Use Topics  . Smoking status: Never Smoker   . Smokeless tobacco: Never Used  . Alcohol Use: No   Family History  Problem Relation Age of Onset  . Heart attack Father 53  . Coronary artery disease Father   . Other Mother     Arrythmia    . Coronary artery disease Mother   . Hypertension Mother   . Anxiety disorder Mother   . Breast cancer      2nd cousin  . Depression      family history   Allergies  Allergen Reactions  . Ace Inhibitors     REACTION: cough  . Alendronate Sodium     REACTION: reflux, trouble swallowing  . Chocolate   . Dust Mite Extract   . Ibandronate Sodium     REACTION: reflux and increased BP   Current Outpatient Prescriptions on File Prior to Visit  Medication Sig Dispense Refill  . estradiol (ESTRACE) 0.1 MG/GM vaginal cream Place 2 g vaginally daily as needed.      . fexofenadine (ALLEGRA) 180 MG tablet Take 180 mg by mouth daily as needed.      . fish oil-omega-3 fatty acids 1000 MG capsule Take 1 g by mouth daily.    Marland Kitchen losartan-hydrochlorothiazide (HYZAAR) 50-12.5 MG per tablet TAKE 0.5 TABLET (1/2TABLET) BY MOUTH DAILY. 45 tablet 3  . mometasone (NASONEX) 50 MCG/ACT nasal spray Place 1 spray into the nose daily as needed.     . Multiple Vitamin (MULTIVITAMIN) tablet Take  1 tablet by mouth daily.      . pantoprazole (PROTONIX) 40 MG tablet   11  . Polyethyl Glycol-Propyl Glycol (SYSTANE ULTRA OP) Place 1 drop into both eyes 4 (four) times daily.      . RESTASIS 0.05 % ophthalmic emulsion   2  . sodium chloride (MURO 128) 5 % ophthalmic ointment Place 1 drop into both eyes as needed.       No current facility-administered medications on file prior to visit.     Review of Systems Review of Systems  Constitutional: Negative for fever, appetite change, fatigue and unexpected weight change.  Eyes: Negative for pain and visual disturbance.  Respiratory: Negative for cough and shortness of breath.   Cardiovascular: Negative for cp or palpitations    Gastrointestinal: Negative for nausea, diarrhea and constipation.  Genitourinary: Negative for urgency and frequency.  Skin: Negative for pallor or rash   Neurological: Negative for weakness, light-headedness, numbness and headaches.   Hematological: Negative for adenopathy. Does not bruise/bleed easily.  Psychiatric/Behavioral: Negative for dysphoric mood. The patient is not nervous/anxious.         Objective:   Physical Exam  Constitutional: She appears well-developed and well-nourished. No distress.  HENT:  Head: Normocephalic.  Right Ear: External ear normal.  Left Ear: External ear normal.  Nose: Nose normal.  Mouth/Throat: Oropharynx is clear and moist.  Tenderness over R mandible (not severe) with very mild swelling and fading bruise below that on her neck   No neck tenderness   Eyes: Conjunctivae and EOM are normal. Pupils are equal, round, and reactive to light. No scleral icterus.  Neck: Normal range of motion. Neck supple.  Cardiovascular: Normal rate and regular rhythm.   Pulmonary/Chest: Effort normal and breath sounds normal. She exhibits no tenderness.  Abdominal: Soft. Bowel sounds are normal.  Musculoskeletal: She exhibits no edema.  No spinal tenderness Mild tenderness over L thoracic musculature  Nl rom spine  Some discomfort with twisting   No bruising on back or abdomen  Lymphadenopathy:    She has no cervical adenopathy.  Neurological: She is alert. She has normal reflexes. No cranial nerve deficit. She exhibits normal muscle tone. Coordination normal.  Skin: Skin is warm and dry. No rash noted. No erythema. No pallor.  Ecchymosis (old) over R shoulder w/o tenderness   Psychiatric: She has a normal mood and affect.  Nl affect  Cheerful and talkative           Assessment & Plan:   Problem List Items Addressed This Visit    Contusion of jaw - Primary    From slip and fall into a sink 5 d ago  Swollen- some old bruising Nl rom  Sent for xr r/o fracture       Relevant Orders   DG Mandible 4 Views (Completed)   Fall against object    Against sink in bathroom on vacation  Pt states she was dancing/frolicking Disc fall prev in detail   Contusion of jaw and strain of back  noted       Thoracic back pain    After a twist and fall in pt with hx of ostopenia  No vertebral tenderness at all- so fx is doubtful No neuro symptoms  Disc symptomatic care - see instructions on AVS  Update if not starting to improve in a week or if worsening

## 2014-08-15 DIAGNOSIS — W1800XA Striking against unspecified object with subsequent fall, initial encounter: Secondary | ICD-10-CM | POA: Insufficient documentation

## 2014-08-15 NOTE — Assessment & Plan Note (Signed)
From slip and fall into a sink 5 d ago  Swollen- some old bruising Nl rom  Sent for xr r/o fracture

## 2014-08-15 NOTE — Telephone Encounter (Signed)
Called to schedule appt, no answer, left message

## 2014-08-15 NOTE — Telephone Encounter (Signed)
That is fine 

## 2014-08-15 NOTE — Assessment & Plan Note (Signed)
Against sink in bathroom on vacation  Pt states she was dancing/frolicking Disc fall prev in detail   Contusion of jaw and strain of back noted

## 2014-08-15 NOTE — Assessment & Plan Note (Signed)
After a twist and fall in pt with hx of ostopenia  No vertebral tenderness at all- so fx is doubtful No neuro symptoms  Disc symptomatic care - see instructions on AVS  Update if not starting to improve in a week or if worsening

## 2014-08-16 ENCOUNTER — Ambulatory Visit: Payer: Medicare Other | Admitting: Physical Therapy

## 2014-08-16 ENCOUNTER — Encounter: Payer: Self-pay | Admitting: Physical Therapy

## 2014-08-16 DIAGNOSIS — R42 Dizziness and giddiness: Secondary | ICD-10-CM

## 2014-08-16 DIAGNOSIS — R2689 Other abnormalities of gait and mobility: Secondary | ICD-10-CM

## 2014-08-16 NOTE — Therapy (Signed)
[place] MAIN [facility] SERVICES [address] [place], [place], [address] Phone: [phone]   Fax:  [phone]  Physical Therapy Treatment  Patient Details  Name: Sierra Bentley MRN: [id number] Date of Birth: 10/09/1933 Referring Provider:  Carloyn Manner, MD  Encounter Date: 08/16/2014      PT End of Session - 08/16/14 1018    Visit Number 6   Number of Visits 8   Date for PT Re-Evaluation 09/11/14   Authorization Type No g codes   PT Start Time 05/12/1016   PT Stop Time 1106   PT Time Calculation (min) 48 min   Equipment Utilized During Treatment Gait belt   Activity Tolerance Patient tolerated treatment well   Behavior During Therapy [facility] for tasks assessed/performed      Past Medical History  Diagnosis Date  . GERD (gastroesophageal reflux disease)   . HTN (hypertension)   . Osteopenia   . Rheumatoid arthritis(714.0)   . Urine incontinence   . Epistaxis   . Allergic rhinitis, cause unspecified   . Other malaise and fatigue   . Other and unspecified hyperlipidemia   . Cramp of limb   . Diverticulosis     Past Surgical History  Procedure Laterality Date  . Tonsillectomy    . Cystoscopy  May 12, 2004  . Colonoscopy  12-May-2001  . Mole removal      rectal  . Breast biopsy  2/11    fibrocystic change  . Breast biopsy  8/11    fibrocystic change    There were no vitals filed for this visit.  Visit Diagnosis:  Dizziness and giddiness  Balance disorder      Subjective Assessment - 08/16/14 1120    Subjective Pt states that she suffered a fall last week on 7/13. Pt states she was "messing around" in the bathroom and she turned quickly and fell onto the sink striking her jaw and twisting her back. Pt states she went to see her physician and that she had an xray of her jaw performed yesterday. Pt states she does not have the results back yet. Pt states she had been taking Aleve for pain and using ice for the jaw and heat for her back.  Pt states that her pain is getting better but she is still sore. Pt states she has had the sensation of "wanting to fall backwards" 3-4 times since the fall. Pt deines vertigo and reports she has not been having dizziness since the fall.     Pertinent History Pt reports that she fell in October 2013 for an unknown reason. Since the fall she has noticed chronic difficulty with her balance and gait. She denies dizziness at the time of the fall but does state that approximately 1 year after her fall she started having dizziness. She initially attributed it to her BP medications so she started taking her medication at night instead. Pt reports subsequent falls since that time and has had approximately 3 falls over the last 12 months. Pt describes her symptoms as "imbalance." On average her episodes occur multiple times/day but not every day. Pt states that when she wakes up she feels like something is "crazy in her head." Pt complains of aural fullness, worse on the L. Pt reports intermittent ear aches bilaterally but not lately. Pt denies tinnitus but reports prior history of tinnitus multiple years ago. Pt wears reading glasses but does not have prescription glasses. Pt reports history of headaches but was told that she  doesn't have migraine headaches. She states she is allergic to "chocolate" but upon further questioning chocolates trigger headaches. No other known triggers. Denies dysphagia, diplopia, or drop attacks. Reports intermittent slurring (as reported by husband) but pt believes that he probably just can't hear well. Pt reports bilateral foot numbness at night, but worse on the L. Pt went to ENT and had VNG testing. Medical record reports 39% L ear weakness. Pt also with acute otitis externa. Otherwise no reported abnormalities. Pt denies recent imaging of brain.    Patient Stated Goals Decrease dizziness and improve function at home     Neuromuscular Re-education: Pt performed on 2" X 4" board, 6'  times 6 reps of tandem walking, sidestepping L/R. Pt reaching occasionally to lightly touch bar for balance but majority of times patient was able to self-correct balance with CGA. Performed static sideways stance on board while doing arm circles while tracking with eyes and head.  Pt performed stepping onto and off red foam floor mat and tandem walking across mat 5' times 6 reps. Pt did not have any LOB with this activity. Performed grapevine and then karaoke walking L/R 20' times 2 each. Performed static standing on balance pods with feet in narrow BOS position and then in semi-tandem stance position. Pt ambulated over balance pods 4' with use of one rail 2 finger intermittent hold secondary to LOB. Pt required CGA to Min A with all balance pod activities.        PT Education - 08/16/14 1132    Education provided Yes   Education Details HEP reinforcement   Person(s) Educated Patient   Methods Explanation   Comprehension Verbalized understanding          PT Long Term Goals - 07/03/14 1646    PT LONG TERM GOAL #1   Title Pt will decrease DHI to <10/100 in order to demonstrate improved symptoms and increase function at home by 09/11/14   Status New   PT LONG TERM GOAL #2   Title Pt will improve ABC to >67% in order to demonstrate improved balance confidence and decreased fall risk by 09/11/14   Status New   PT LONG TERM GOAL #3   Title Pt will decrease symptomatic dizziness to <5/10 with aggravating activities in order to decrease risk for falls by 09/11/14   PT LONG TERM GOAL #4   Title Pt will be independent with HEP in order to decrease symptoms and improve function at home by 09/11/14   Status New           Plan - 08/16/14 1133    Clinical Impression Statement Pt did well with karioke and grapevine walking activities as well as with tandem gait on red foam mat. Pt was challenged by static stance and attempting to walk over balance pods and continues to have difficulty with 2" X  4" board activities with narrow BOS. Pt would benefit from continued PT services to try to further improve balance and decrease patient's falls risk.    Pt will benefit from skilled therapeutic intervention in order to improve on the following deficits Decreased balance;Dizziness   Rehab Potential Good   Clinical Impairments Affecting Rehab Potential Postive: motivation, Negative chronicity   PT Frequency 1x / week   PT Duration 8 weeks   PT Treatment/Interventions Canalith Repostioning;DME Instruction;Gait training;Stair training;Therapeutic activities;Therapeutic exercise;Balance training;Neuromuscular re-education;Vestibular   PT Next Visit Plan Plan on doing balance Behnken/pod activities and consider trying Dynadisc with and without clock arm reaching or  body turns. Plan on retesting functional outcome measures in next few visits.    PT Home Exercise Plan VOR x 1 horizontal in narrow stance, semitandem stance progressions with body turns and feet together with horizontal and vertical head turns on pillow/foam surface and tandem walking along countertop.        Problem List Patient Active Problem List   Diagnosis Date Noted  . Fall against object 08/15/2014  . Contusion of jaw 08/14/2014  . Thoracic back pain 08/14/2014  . Acute sinus infection 02/08/2014  . Vaccine reaction 08/03/2013  . Encounter for Medicare annual wellness exam 07/25/2013  . Varicosities of leg 05/23/2012  . Skin lesion of right lower limb 05/23/2012  . Abnormal ultrasound of thyroid gland 03/01/2011  . HYPERLIPIDEMIA 06/11/2008  . ALLERGIC RHINITIS 03/29/2008  . Essential hypertension, benign 12/26/2006  . GERD 12/26/2006  . RHEUMATOID ARTHRITIS 12/26/2006  . OSTEOPENIA 12/26/2006  . URINARY INCONTINENCE 12/26/2006   Lady Deutscher PT, DPT  Lady Deutscher 08/16/2014, 11:38 AM  [place] MAIN [facility] SERVICES [address] [place], [place], [address] Phone: [phone]    Fax:  [phone]

## 2014-08-20 NOTE — Telephone Encounter (Signed)
Called to set up appt with pt. N/a l/m

## 2014-08-22 NOTE — Telephone Encounter (Signed)
Scheduled 7/29 for labs and 8/2 for cpe. Pt aware

## 2014-08-23 ENCOUNTER — Telehealth: Payer: Self-pay | Admitting: Family Medicine

## 2014-08-23 ENCOUNTER — Ambulatory Visit: Payer: Medicare Other | Admitting: Physical Therapy

## 2014-08-23 ENCOUNTER — Other Ambulatory Visit (INDEPENDENT_AMBULATORY_CARE_PROVIDER_SITE_OTHER): Payer: Medicare Other

## 2014-08-23 DIAGNOSIS — E785 Hyperlipidemia, unspecified: Secondary | ICD-10-CM | POA: Diagnosis not present

## 2014-08-23 DIAGNOSIS — I10 Essential (primary) hypertension: Secondary | ICD-10-CM | POA: Diagnosis not present

## 2014-08-23 LAB — CBC WITH DIFFERENTIAL/PLATELET
BASOS PCT: 0.6 % (ref 0.0–3.0)
Basophils Absolute: 0 10*3/uL (ref 0.0–0.1)
Eosinophils Absolute: 0.1 10*3/uL (ref 0.0–0.7)
Eosinophils Relative: 2.4 % (ref 0.0–5.0)
HEMATOCRIT: 40.3 % (ref 36.0–46.0)
HEMOGLOBIN: 13.5 g/dL (ref 12.0–15.0)
Lymphocytes Relative: 33.1 % (ref 12.0–46.0)
Lymphs Abs: 2 10*3/uL (ref 0.7–4.0)
MCHC: 33.4 g/dL (ref 30.0–36.0)
MCV: 83.2 fl (ref 78.0–100.0)
MONOS PCT: 7.5 % (ref 3.0–12.0)
Monocytes Absolute: 0.4 10*3/uL (ref 0.1–1.0)
NEUTROS ABS: 3.4 10*3/uL (ref 1.4–7.7)
Neutrophils Relative %: 56.4 % (ref 43.0–77.0)
Platelets: 248 10*3/uL (ref 150.0–400.0)
RBC: 4.84 Mil/uL (ref 3.87–5.11)
RDW: 13.4 % (ref 11.5–15.5)
WBC: 5.9 10*3/uL (ref 4.0–10.5)

## 2014-08-23 LAB — LIPID PANEL
CHOL/HDL RATIO: 4
Cholesterol: 201 mg/dL — ABNORMAL HIGH (ref 0–200)
HDL: 45.4 mg/dL (ref >39.00)
LDL Cholesterol: 117 mg/dL — ABNORMAL HIGH (ref 0–99)
NonHDL: 155.24
Triglycerides: 191 mg/dL — ABNORMAL HIGH (ref 0.0–149.0)
VLDL: 38.2 mg/dL (ref 0.0–40.0)

## 2014-08-23 LAB — COMPREHENSIVE METABOLIC PANEL
ALT: 14 U/L (ref 0–35)
AST: 14 U/L (ref 0–37)
Albumin: 4.2 g/dL (ref 3.5–5.2)
Alkaline Phosphatase: 77 U/L (ref 39–117)
BUN: 13 mg/dL (ref 6–23)
CHLORIDE: 101 meq/L (ref 96–112)
CO2: 33 mEq/L — ABNORMAL HIGH (ref 19–32)
Calcium: 9.5 mg/dL (ref 8.4–10.5)
Creatinine, Ser: 0.71 mg/dL (ref 0.40–1.20)
GFR: 84.47 mL/min (ref >60.00)
Glucose, Bld: 96 mg/dL (ref 70–99)
POTASSIUM: 4 meq/L (ref 3.5–5.1)
SODIUM: 140 meq/L (ref 135–145)
Total Bilirubin: 0.6 mg/dL (ref 0.2–1.2)
Total Protein: 6.6 g/dL (ref 6.0–8.3)

## 2014-08-23 LAB — TSH: TSH: 2.41 u[IU]/mL (ref 0.35–4.50)

## 2014-08-23 NOTE — Telephone Encounter (Signed)
Labs

## 2014-08-26 ENCOUNTER — Ambulatory Visit: Payer: Medicare Other | Attending: Otolaryngology | Admitting: Physical Therapy

## 2014-08-26 ENCOUNTER — Encounter: Payer: Self-pay | Admitting: Physical Therapy

## 2014-08-26 DIAGNOSIS — R29818 Other symptoms and signs involving the nervous system: Secondary | ICD-10-CM | POA: Insufficient documentation

## 2014-08-26 DIAGNOSIS — R42 Dizziness and giddiness: Secondary | ICD-10-CM | POA: Diagnosis present

## 2014-08-26 DIAGNOSIS — M25512 Pain in left shoulder: Secondary | ICD-10-CM | POA: Diagnosis present

## 2014-08-26 DIAGNOSIS — M542 Cervicalgia: Secondary | ICD-10-CM | POA: Insufficient documentation

## 2014-08-26 DIAGNOSIS — R2689 Other abnormalities of gait and mobility: Secondary | ICD-10-CM

## 2014-08-26 NOTE — Therapy (Signed)
[place] MAIN [facility] SERVICES [address] [place], [place], [address] Phone: [phone]   Fax:  [phone]  Physical Therapy Treatment  Patient Details  Name: Sierra Bentley MRN: [id number] Date of Birth: 08/24/1933 Referring Provider:  Carloyn Manner, MD  Encounter Date: 08/26/2014      PT End of Session - 08/26/14 1154    Visit Number 7   Number of Visits 8   Date for PT Re-Evaluation 09/11/14   Authorization Type No g codes   PT Start Time 1140   PT Stop Time 1222   PT Time Calculation (min) 42 min   Equipment Utilized During Treatment Gait belt   Activity Tolerance Patient tolerated treatment well   Behavior During Therapy [facility] for tasks assessed/performed      Past Medical History  Diagnosis Date  . GERD (gastroesophageal reflux disease)   . HTN (hypertension)   . Osteopenia   . Rheumatoid arthritis(714.0)   . Urine incontinence   . Epistaxis   . Allergic rhinitis, cause unspecified   . Other malaise and fatigue   . Other and unspecified hyperlipidemia   . Cramp of limb   . Diverticulosis     Past Surgical History  Procedure Laterality Date  . Tonsillectomy    . Cystoscopy  2006  . Colonoscopy  2003  . Mole removal      rectal  . Breast biopsy  2/11    fibrocystic change  . Breast biopsy  8/11    fibrocystic change    There were no vitals filed for this visit.  Visit Diagnosis:  Dizziness and giddiness  Balance disorder      Subjective Assessment - 08/26/14 1152    Subjective Pt states that her jaw and back are doing better since her fall a few weeks ago but states she is now having left shoulder and neck pain. Pt reports she was having dizziness initially after the fall but reports it has been getting less and less each week.    Pertinent History Pt reports that she fell in October 2013 for an unknown reason. Since the fall she has noticed chronic difficulty with her balance and gait. She denies  dizziness at the time of the fall but does state that approximately 1 year after her fall she started having dizziness. She initially attributed it to her BP medications so she started taking her medication at night instead. Pt reports subsequent falls since that time and has had approximately 3 falls over the last 12 months. Pt describes her symptoms as "imbalance." On average her episodes occur multiple times/day but not every day. Pt states that when she wakes up she feels like something is "crazy in her head." Pt complains of aural fullness, worse on the L. Pt reports intermittent ear aches bilaterally but not lately. Pt denies tinnitus but reports prior history of tinnitus multiple years ago. Pt wears reading glasses but does not have prescription glasses. Pt reports history of headaches but was told that she doesn't have migraine headaches. She states she is allergic to "chocolate" but upon further questioning chocolates trigger headaches. No other known triggers. Denies dysphagia, diplopia, or drop attacks. Reports intermittent slurring (as reported by husband) but pt believes that he probably just can't hear well. Pt reports bilateral foot numbness at night, but worse on the L. Pt went to ENT and had VNG testing. Medical record reports 39% L ear weakness. Pt also with acute otitis externa. Otherwise no reported  abnormalities. Pt denies recent imaging of brain.    Patient Stated Goals Decrease dizziness and improve function at home       Neuromuscular Re-eduction:   Pt completed ABC scale and DHI scale self-report questionnaires. Discussed test results and Plan of care.  Pt performed balance Trentman/pod walking 6 reps with min A and vc. Pt with several losses of balance but did improve with practice. Pt performed standing on yellow Dynadisc and attempted green Dynadisc however this was too challenging. Pt on yellow Dynadisc demonstrates difficulty maintaining static stance and holds arms in high guard  position varying between waist and shoulder high. Pt required min A secondary to several losses of balance. Pt tends to lose her balance posteriorly and posteriolaterally to the right.        PT Education - 08/26/14 1338    Education provided Yes   Education Details HEP reinforced; discussed POC   Person(s) Educated Patient   Methods Explanation   Comprehension Verbalized understanding           PT Long Term Goals - 08/26/14 1341    PT LONG TERM GOAL #1   Title Pt will decrease DHI to <10/100 in order to demonstrate improved symptoms and increase function at home by 09/11/14   Baseline Score 24 on initial eval and scored 12/100 on 08/26/14.   Period Weeks   Status Partially Met   PT LONG TERM GOAL #2   Title Pt will improve ABC to >67% in order to demonstrate improved balance confidence and decreased fall risk by 09/11/14   Baseline Pt scored 64.3% on initial eval and 85.6% on 08/26/14.   Period Weeks   Status Achieved   PT LONG TERM GOAL #3   Title Pt will decrease symptomatic dizziness to <5/10 with aggravating activities in order to decrease risk for falls by 09/11/14   Period Weeks   Status Partially Met   PT LONG TERM GOAL #4   Title Pt will be independent with HEP in order to decrease symptoms and improve function at home by 09/11/14   Period Weeks   Status Achieved           Plan - 08/26/14 1155    Clinical Impression Statement Pt demonstrated improvements on all functional outcome measures. Pt improved from 64.3% to 85.6% on the ABC scale which indicates decreased falls risk and score a 12 on the [facility] which is low perception of handicap. Pt improved from 9 sec with loss of balance to 30 seconds with mild sway with feet together eyes closed on foam surface. Pt would benefit from continued PT services to address remaining goals as set on POC and to try to further reduce patient's fall risk and improve dynamic balance.    Pt will benefit from skilled therapeutic intervention  in order to improve on the following deficits Decreased balance;Dizziness   Rehab Potential Good   Clinical Impairments Affecting Rehab Potential Postive: motivation, Negative chronicity   PT Frequency 1x / week   PT Duration 8 weeks   PT Treatment/Interventions Canalith Repostioning;DME Instruction;Gait training;Stair training;Therapeutic activities;Therapeutic exercise;Balance training;Neuromuscular re-education;Vestibular   PT Next Visit Plan Plan on further progressing HEP and balance activities in clinic. Consider doing Dynadisc and balance pod activities again next visit as these were challenging for patient. Consider doing Berg balance test or DGI.    PT Home Exercise Plan VOR x 1 horizontal in narrow stance, semitandem stance progressions with body turns and feet together with horizontal and vertical head  turns on pillow/foam surface and tandem walking along countertop.    Consulted and Agree with Plan of Care Patient      Problem List Patient Active Problem List   Diagnosis Date Noted  . Fall against object 08/15/2014  . Contusion of jaw 08/14/2014  . Thoracic back pain 08/14/2014  . Acute sinus infection 02/08/2014  . Vaccine reaction 08/03/2013  . Encounter for Medicare annual wellness exam 07/25/2013  . Varicosities of leg 05/23/2012  . Skin lesion of right lower limb 05/23/2012  . Abnormal ultrasound of thyroid gland 03/01/2011  . Hyperlipidemia 06/11/2008  . ALLERGIC RHINITIS 03/29/2008  . Essential hypertension, benign 12/26/2006  . GERD 12/26/2006  . RHEUMATOID ARTHRITIS 12/26/2006  . OSTEOPENIA 12/26/2006  . URINARY INCONTINENCE 12/26/2006   Lady Deutscher PT, DPT Lady Deutscher 08/26/2014, 1:48 PM  [place] MAIN [facility] SERVICES [address] [place], [place], [address] Phone: [phone]   Fax:  [phone]

## 2014-08-27 ENCOUNTER — Encounter: Payer: Self-pay | Admitting: Family Medicine

## 2014-08-27 ENCOUNTER — Ambulatory Visit (INDEPENDENT_AMBULATORY_CARE_PROVIDER_SITE_OTHER): Payer: Medicare Other | Admitting: Family Medicine

## 2014-08-27 VITALS — BP 120/68 | HR 80 | Temp 98.3°F | Ht 64.5 in | Wt 149.5 lb

## 2014-08-27 DIAGNOSIS — I10 Essential (primary) hypertension: Secondary | ICD-10-CM

## 2014-08-27 DIAGNOSIS — Z0000 Encounter for general adult medical examination without abnormal findings: Secondary | ICD-10-CM | POA: Insufficient documentation

## 2014-08-27 DIAGNOSIS — M8580 Other specified disorders of bone density and structure, unspecified site: Secondary | ICD-10-CM | POA: Diagnosis not present

## 2014-08-27 DIAGNOSIS — E785 Hyperlipidemia, unspecified: Secondary | ICD-10-CM

## 2014-08-27 DIAGNOSIS — Z1211 Encounter for screening for malignant neoplasm of colon: Secondary | ICD-10-CM | POA: Insufficient documentation

## 2014-08-27 MED ORDER — LOSARTAN POTASSIUM-HCTZ 50-12.5 MG PO TABS: ORAL_TABLET | ORAL | Status: DC

## 2014-08-27 MED ORDER — MOMETASONE FUROATE 50 MCG/ACT NA SUSP: 1.0000 | Freq: Every day | NASAL | Status: DC | PRN

## 2014-08-27 NOTE — Assessment & Plan Note (Signed)
bp in fair control at this time  BP Readings from Last 1 Encounters:  08/27/14 120/68   No changes needed Disc lifstyle change with low sodium diet and exercise  Labs reviewed  Med refilled

## 2014-08-27 NOTE — Assessment & Plan Note (Signed)
Disc goals for lipids and reasons to control them Rev labs with pt Rev low sat fat diet in detail Improved with better diet  

## 2014-08-27 NOTE — Assessment & Plan Note (Signed)
Reviewed health habits including diet and exercise and skin cancer prevention Reviewed appropriate screening tests for age  Also reviewed health mt list, fam hx and immunization status , as well as social and family history   See HPI Labs reviewed  Given packet to work on advance directive-pt agreed IFOB stool screen kit given

## 2014-08-27 NOTE — Patient Instructions (Signed)
Please do IFOB stool kit for colon cancer screening  Take care of yourself  Please work on an Advance Directive (the blue packed I gave you)

## 2014-08-27 NOTE — Progress Notes (Signed)
Subjective:    Patient ID: Sierra Bentley, female    DOB: 10/20/1933, 81 y.o.   MRN: [id number]  HPI Here for annual medicare wellness visit as well as chronic/acute medical problems and also annual preventative exam   I have personally reviewed the Medicare Annual Wellness questionnaire and have noted 1. The patient's medical and social history 2. Their use of alcohol, tobacco or illicit drugs 3. Their current medications and supplements 4. The patient's functional ability including ADL's, fall risks, home safety risks and hearing or visual             impairment. 5. Diet and physical activities 6. Evidence for depression or mood disorders  The patients weight, height, BMI have been recorded in the chart and visual acuity is per eye clinic.  I have made referrals, counseling and provided education to the patient based review of the above and I have provided the pt with a written personalized care plan for preventive services. Reviewed and updated provider list, see scanned forms.  Doing better after her fall- jaw xr was ok and pain is better   See scanned forms.  Routine anticipatory guidance given to patient.  See health maintenance. Colon cancer screening colonosc 9/13 - said she did not need any more  Breast cancer screening mam and [place] - 2/16 -wanted to repeat R [place] in 6 mo but it looked ok , has very dense breasts (hard to read) -she has the 6 mo appt  Self breast exam no lumps  Flu vaccine 10/15  Tetanus vaccine 1/07  Pneumovax 7/15- had both/ complete  Zoster vaccine 3/10  dexa 2/16 LS improved 5.5% -osteopenia , takes her ca and D, no fractures  Advance directive does not have living will or POA- given packet on that to review today Cognitive function addressed- see scanned forms- and if abnormal then additional documentation follows.  No major concerns for age -doing well / occ walks into a room and forgets why    PMH and SH reviewed  Meds, vitals, and allergies  reviewed.   ROS: See HPI.  Otherwise negative.    Wt is down 2 lb with bmi 25  She eats healthy and exercises   bp is stable today  No cp or palpitations or headaches or edema  No side effects to medicines  BP Readings from Last 3 Encounters:  08/27/14 120/68  08/14/14 126/62  08/02/14 119/45     Hyperlipidemia Lab Results  Component Value Date   CHOL 201* 08/23/2014   CHOL 222* 07/18/2013   CHOL 206* 02/25/2012   Lab Results  Component Value Date   HDL 45.40 08/23/2014   HDL 46.60 07/18/2013   HDL 41.80 02/25/2012   Lab Results  Component Value Date   LDLCALC 117* 08/23/2014   LDLCALC 154* 07/18/2013   Lab Results  Component Value Date   TRIG 191.0* 08/23/2014   TRIG 109.0 07/18/2013   TRIG 109.0 02/25/2012   Lab Results  Component Value Date   CHOLHDL 4 08/23/2014   CHOLHDL 5 07/18/2013   CHOLHDL 5 02/25/2012   Lab Results  Component Value Date   LDLDIRECT 130.4 02/25/2012   LDLDIRECT 134.1 08/18/2011   LDLDIRECT 155.9 11/30/2010    Pt states she really watches the fat in her diet  Reads labels also     Chemistry      Component Value Date/Time   NA 140 08/23/2014 0924   K 4.0 08/23/2014 0924   CL 101 08/23/2014  0924   CO2 33* 08/23/2014 0924   BUN 13 08/23/2014 0924   CREATININE 0.71 08/23/2014 0924      Component Value Date/Time   CALCIUM 9.5 08/23/2014 0924   ALKPHOS 77 08/23/2014 0924   AST 14 08/23/2014 0924   ALT 14 08/23/2014 0924   BILITOT 0.6 08/23/2014 0924      Lab Results  Component Value Date   WBC 5.9 08/23/2014   HGB 13.5 08/23/2014   HCT 40.3 08/23/2014   MCV 83.2 08/23/2014   PLT 248.0 08/23/2014   Lab Results  Component Value Date   TSH 2.41 08/23/2014      Patient Active Problem List   Diagnosis Date Noted  . Routine general medical examination at a health care facility 08/27/2014  . Fall against object 08/15/2014  . Contusion of jaw 08/14/2014  . Thoracic back pain 08/14/2014  . Acute sinus infection  02/08/2014  . Vaccine reaction 08/03/2013  . Encounter for Medicare annual wellness exam 07/25/2013  . Varicosities of leg 05/23/2012  . Skin lesion of right lower limb 05/23/2012  . Abnormal ultrasound of thyroid gland 03/01/2011  . Hyperlipidemia 06/11/2008  . ALLERGIC RHINITIS 03/29/2008  . Essential hypertension, benign 12/26/2006  . GERD 12/26/2006  . RHEUMATOID ARTHRITIS 12/26/2006  . Osteopenia 12/26/2006  . URINARY INCONTINENCE 12/26/2006   Past Medical History  Diagnosis Date  . GERD (gastroesophageal reflux disease)   . HTN (hypertension)   . Osteopenia   . Rheumatoid arthritis(714.0)   . Urine incontinence   . Epistaxis   . Allergic rhinitis, cause unspecified   . Other malaise and fatigue   . Other and unspecified hyperlipidemia   . Cramp of limb   . Diverticulosis    Past Surgical History  Procedure Laterality Date  . Tonsillectomy    . Cystoscopy  2006  . Colonoscopy  2003  . Mole removal      rectal  . Breast biopsy  2/11    fibrocystic change  . Breast biopsy  8/11    fibrocystic change   History  Substance Use Topics  . Smoking status: Never Smoker   . Smokeless tobacco: Never Used  . Alcohol Use: No   Family History  Problem Relation Age of Onset  . Heart attack Father 29  . Coronary artery disease Father   . Other Mother     Arrythmia  . Coronary artery disease Mother   . Hypertension Mother   . Anxiety disorder Mother   . Breast cancer      2nd cousin  . Depression      family history   Allergies  Allergen Reactions  . Ace Inhibitors     REACTION: cough  . Alendronate Sodium     REACTION: reflux, trouble swallowing  . Chocolate   . Dust Mite Extract   . Ibandronate Sodium     REACTION: reflux and increased BP   Current Outpatient Prescriptions on File Prior to Visit  Medication Sig Dispense Refill  . estradiol (ESTRACE) 0.1 MG/GM vaginal cream Place 2 g vaginally daily as needed.      . fexofenadine (ALLEGRA) 180 MG tablet  Take 180 mg by mouth daily as needed.      . fish oil-omega-3 fatty acids 1000 MG capsule Take 1 g by mouth daily.    Marland Kitchen losartan-hydrochlorothiazide (HYZAAR) 50-12.5 MG per tablet TAKE 0.5 TABLET (1/2TABLET) BY MOUTH DAILY. 45 tablet 3  . mometasone (NASONEX) 50 MCG/ACT nasal spray Place 1 spray into  the nose daily as needed.     . Multiple Vitamin (MULTIVITAMIN) tablet Take 1 tablet by mouth daily.      . pantoprazole (PROTONIX) 40 MG tablet   11  . Polyethyl Glycol-Propyl Glycol (SYSTANE ULTRA OP) Place 1 drop into both eyes 4 (four) times daily.      . RESTASIS 0.05 % ophthalmic emulsion   2  . sodium chloride (MURO 128) 5 % ophthalmic ointment Place 1 drop into both eyes as needed.       No current facility-administered medications on file prior to visit.    Review of Systems Review of Systems  Constitutional: Negative for fever, appetite change, fatigue and unexpected weight change.  Eyes: Negative for pain and visual disturbance.  Respiratory: Negative for cough and shortness of breath.   Cardiovascular: Negative for cp or palpitations    Gastrointestinal: Negative for nausea, diarrhea and constipation.  Genitourinary: Negative for urgency and frequency.  Skin: Negative for pallor or rash   MSK pos for jaw soreness after fall that is now much improved  Neurological: Negative for weakness, light-headedness, numbness and headaches.  Hematological: Negative for adenopathy. Does not bruise/bleed easily.  Psychiatric/Behavioral: Negative for dysphoric mood. The patient is not nervous/anxious.         Objective:   Physical Exam  Constitutional: She appears well-developed and well-nourished. No distress.  Well appearing   HENT:  Head: Normocephalic and atraumatic.  Right Ear: External ear normal.  Left Ear: External ear normal.  Mouth/Throat: Oropharynx is clear and moist.  No jaw swelling today  Eyes: Conjunctivae and EOM are normal. Pupils are equal, round, and reactive to  light. No scleral icterus.  Neck: Normal range of motion. Neck supple. No JVD present. Carotid bruit is not present. No thyromegaly present.  Cardiovascular: Normal rate, regular rhythm, normal heart sounds and intact distal pulses.  Exam reveals no gallop.   Pulmonary/Chest: Effort normal and breath sounds normal. No respiratory distress. She has no wheezes. She exhibits no tenderness.  Abdominal: Soft. Bowel sounds are normal. She exhibits no distension, no abdominal bruit and no mass. There is no tenderness.  Genitourinary: No breast swelling, tenderness, discharge or bleeding.  Musculoskeletal: Normal range of motion. She exhibits no edema or tenderness.  Lymphadenopathy:    She has no cervical adenopathy.  Neurological: She is alert. She has normal reflexes. No cranial nerve deficit. She exhibits normal muscle tone. Coordination abnormal.  Skin: Skin is warm and dry. No rash noted. No erythema. No pallor.  Psychiatric: She has a normal mood and affect.          Assessment & Plan:   Problem List Items Addressed This Visit    Colon cancer screening    colonosc recall was not rec due to age Given IFOB kit today      Relevant Orders   Fecal occult blood, imunochemical   Encounter for Medicare annual wellness exam - Primary    Reviewed health habits including diet and exercise and skin cancer prevention Reviewed appropriate screening tests for age  Also reviewed health mt list, fam hx and immunization status , as well as social and family history   See HPI Labs reviewed  Given packet to work on advance directive-pt agreed IFOB stool screen kit given      Essential hypertension, benign    bp in fair control at this time  BP Readings from Last 1 Encounters:  08/27/14 120/68   No changes needed Disc lifstyle change with  low sodium diet and exercise  Labs reviewed  Med refilled       Relevant Medications   losartan-hydrochlorothiazide (HYZAAR) 50-12.5 MG per tablet    Hyperlipidemia    Disc goals for lipids and reasons to control them Rev labs with pt Rev low sat fat diet in detail  Improved with better diet       Relevant Medications   losartan-hydrochlorothiazide (HYZAAR) 50-12.5 MG per tablet   Osteopenia    Intol of bisphosphenates in the past No fractures / 1 fall  Imp in LS score at last dexa  Continue to watch  Disc need for calcium/ vitamin D/ wt bearing exercise and bone density test every 2 y to monitor Disc safety/ fracture risk in detail         Routine general medical examination at a health care facility    Reviewed health habits including diet and exercise and skin cancer prevention Reviewed appropriate screening tests for age  Also reviewed health mt list, fam hx and immunization status , as well as social and family history   See HPI Labs reviewed  Given packet to work on advance directive-pt agreed IFOB stool screen kit given

## 2014-08-27 NOTE — Assessment & Plan Note (Signed)
colonosc recall was not rec due to age Given IFOB kit today

## 2014-08-27 NOTE — Progress Notes (Signed)
Pre visit review using our clinic review tool, if applicable. No additional management support is needed unless otherwise documented below in the visit note. 

## 2014-08-27 NOTE — Assessment & Plan Note (Signed)
Intol of bisphosphenates in the past No fractures / 1 fall  Imp in LS score at last dexa  Continue to watch  Disc need for calcium/ vitamin D/ wt bearing exercise and bone density test every 2 y to monitor Disc safety/ fracture risk in detail

## 2014-09-05 ENCOUNTER — Encounter: Payer: Self-pay | Admitting: Physical Therapy

## 2014-09-05 ENCOUNTER — Other Ambulatory Visit (INDEPENDENT_AMBULATORY_CARE_PROVIDER_SITE_OTHER): Payer: Medicare Other

## 2014-09-05 ENCOUNTER — Ambulatory Visit: Payer: Medicare Other | Admitting: Physical Therapy

## 2014-09-05 DIAGNOSIS — R42 Dizziness and giddiness: Secondary | ICD-10-CM | POA: Diagnosis not present

## 2014-09-05 DIAGNOSIS — Z1211 Encounter for screening for malignant neoplasm of colon: Secondary | ICD-10-CM | POA: Diagnosis not present

## 2014-09-05 DIAGNOSIS — R2689 Other abnormalities of gait and mobility: Secondary | ICD-10-CM

## 2014-09-05 LAB — FECAL OCCULT BLOOD, GUAIAC: FECAL OCCULT BLD: NEGATIVE

## 2014-09-05 NOTE — Therapy (Signed)
[place] MAIN [facility] SERVICES [address] [place], [place], [address] Phone: [phone]   Fax:  [phone]  Physical Therapy Discharge Summary  Patient Details  Name: Sierra Bentley MRN: [id number] Date of Birth: [birth year] Referring Provider:  Carloyn Manner, MD  Encounter Date: 09/05/2014      PT End of Session - 09/05/14 1136    Visit Number 8   Number of Visits 8   Date for PT Re-Evaluation 09/11/14   Authorization Type --   PT Start Time 1135   PT Stop Time 1214   PT Time Calculation (min) 39 min   Equipment Utilized During Treatment Gait belt   Activity Tolerance Patient tolerated treatment well   Behavior During Therapy [facility] for tasks assessed/performed      Past Medical History  Diagnosis Date  . GERD (gastroesophageal reflux disease)   . HTN (hypertension)   . Osteopenia   . Rheumatoid arthritis(714.0)   . Urine incontinence   . Epistaxis   . Allergic rhinitis, cause unspecified   . Other malaise and fatigue   . Other and unspecified hyperlipidemia   . Cramp of limb   . Diverticulosis     Past Surgical History  Procedure Laterality Date  . Tonsillectomy    . Cystoscopy  2006  . Colonoscopy  2003  . Mole removal      rectal  . Breast biopsy  2/11    fibrocystic change  . Breast biopsy  8/11    fibrocystic change    There were no vitals filed for this visit.  Visit Diagnosis:  Dizziness and giddiness  Balance disorder      Subjective Assessment - 09/05/14 1139    Subjective Pt states that she feels ready to finish vestibular therapy and ready to begin therapy for her neck. Pt states that her neck is bothering her today. Left upper trap area. Pt reports she had "a little bit dizzy this morning and when this happens (neck pain) it causes  some dizziness. Pt states her neck pain "comes and goes".   Pertinent History Pt reports that she fell in October 2013 for an unknown reason. Since the fall she has  noticed chronic difficulty with her balance and gait. She denies dizziness at the time of the fall but does state that approximately 1 year after her fall she started having dizziness. She initially attributed it to her BP medications so she started taking her medication at night instead. Pt reports subsequent falls since that time and has had approximately 3 falls over the last 12 months. Pt describes her symptoms as "imbalance." On average her episodes occur multiple times/day but not every day. Pt states that when she wakes up she feels like something is "crazy in her head." Pt complains of aural fullness, worse on the L. Pt reports intermittent ear aches bilaterally but not lately. Pt denies tinnitus but reports prior history of tinnitus multiple years ago. Pt wears reading glasses but does not have prescription glasses. Pt reports history of headaches but was told that she doesn't have migraine headaches. She states she is allergic to "chocolate" but upon further questioning chocolates trigger headaches. No other known triggers. Denies dysphagia, diplopia, or drop attacks. Reports intermittent slurring (as reported by husband) but pt believes that he probably just can't hear well. Pt reports bilateral foot numbness at night, but worse on the L. Pt went to ENT and had VNG testing. Medical record reports 39% L ear weakness.  Pt also with acute otitis externa. Otherwise no reported abnormalities. Pt denies recent imaging of brain.    Patient Stated Goals Decrease dizziness and improve function at home       Neuromuscular Reeducation:  On firm surface performed slow marching with 3-5 second holds with and without opposite arm flexion and then added with head turns side to side. Performed SLS static 5-10 second holds L and R leg. Then on Airex pad, performed slow marching. Performed tandem walking in // bars 8' times 6 reps with CGA. Pt improved with practice but had multiple small losses of balance that  she was able to self-correct.   Reviewed HEP and added additional exercises.    In standing, pt performed VOR X 1 horiz 3 reps of 1 minute each. Discussed discharge plans and progress towards goals. Pt in agreement to complete vestibular rehab at this time. Pt would like th start PT services for left neck and shoulder pain and prescription from patient's provider was obtained. Will have front office staff coordinate setting up initial evaluation with patient. Provided patient with information in regards to a variety of community balance programs and provided handouts. Discussed [employer], [employer] and [employer].         PT Education - 09/06/14 1043    Education provided Yes   Education Details Finalized HEP which includes: VOR X 1 with progressions, standing with feet together and standing with stride with progressions, slow marching with progressions, SLS, tandem stance and tandem walking, and walking with head turns. Reviewed safety precautions.    Person(s) Educated Patient   Methods Explanation;Handout   Comprehension Verbalized understanding;Returned demonstration;Need further instruction             PT Long Term Goals - 09/15/14 1048    PT LONG TERM GOAL #1   Title Pt will decrease DHI to <10/100 in order to demonstrate improved symptoms and increase function at home by 09/11/14   Baseline Score 24 on initial eval and scored 12/100 on 08/26/14.   Period Weeks   Status Partially Met   PT LONG TERM GOAL #2   Title Pt will improve ABC to >67% in order to demonstrate improved balance confidence and decreased fall risk by 09/11/14   Baseline Pt scored 64.3% on initial eval and 85.6% on 08/26/14.   Period Weeks   Status Achieved   PT LONG TERM GOAL #3   Title Pt will decrease symptomatic dizziness to <5/10 with aggravating activities in order to decrease risk for falls by 09/11/14   Period Weeks   Status Achieved   PT LONG TERM GOAL #4   Title Pt will be independent  with HEP in order to decrease symptoms and improve function at home by 09/11/14   Period Weeks   Status Achieved              G-Codes - 15-Sep-2014 1047    Functional Assessment Tool Used DHI, ABC, clinical judgement   Functional Limitation Mobility: Walking and moving around   Mobility: Walking and Moving Around Current Status [phone]) At least 20 percent but less than 40 percent impaired, limited or restricted   Mobility: Walking and Moving Around Goal Status [phone]) At least 1 percent but less than 20 percent impaired, limited or restricted   Mobility: Walking and Moving Around Discharge Status [phone]) At least 1 percent but less than 20 percent impaired, limited or restricted      Problem List Patient Active Problem List  Diagnosis Date Noted  . Routine general medical examination at a health care facility 08/27/2014  . Colon cancer screening 08/27/2014  . Fall against object 08/15/2014  . Contusion of jaw 08/14/2014  . Thoracic back pain 08/14/2014  . Acute sinus infection 02/08/2014  . Vaccine reaction 08/03/2013  . Encounter for Medicare annual wellness exam 07/25/2013  . Varicosities of leg 05/23/2012  . Skin lesion of right lower limb 05/23/2012  . Abnormal ultrasound of thyroid gland 03/01/2011  . Hyperlipidemia 06/11/2008  . ALLERGIC RHINITIS 03/29/2008  . Essential hypertension, benign 12/26/2006  . GERD 12/26/2006  . RHEUMATOID ARTHRITIS 12/26/2006  . Osteopenia 12/26/2006  . URINARY INCONTINENCE 12/26/2006      Lady Deutscher PT, DPT Lady Deutscher 09/06/2014, 10:49 AM  [place] MAIN [facility] SERVICES [address] [place], [place], [address] Phone: [phone]   Fax:  [phone]

## 2014-09-06 ENCOUNTER — Encounter: Payer: Self-pay | Admitting: *Deleted

## 2014-09-06 ENCOUNTER — Encounter: Payer: Self-pay | Admitting: Family Medicine

## 2014-09-06 LAB — FECAL OCCULT BLOOD, IMMUNOCHEMICAL: FECAL OCCULT BLD: NEGATIVE

## 2014-09-16 ENCOUNTER — Encounter: Payer: Self-pay | Admitting: Family Medicine

## 2014-09-18 ENCOUNTER — Ambulatory Visit: Payer: Medicare Other

## 2014-09-18 DIAGNOSIS — M542 Cervicalgia: Secondary | ICD-10-CM

## 2014-09-18 DIAGNOSIS — M25512 Pain in left shoulder: Secondary | ICD-10-CM

## 2014-09-18 DIAGNOSIS — R42 Dizziness and giddiness: Secondary | ICD-10-CM | POA: Diagnosis not present

## 2014-09-18 NOTE — Therapy (Addendum)
[place] MAIN [facility] SERVICES [address] [place], [place], [address] Phone: [phone]   Fax:  [phone]  Physical Therapy Evaluation  Patient Details  Name: Sierra Bentley MRN: [id number] Date of Birth: 02/27/1933 Referring Provider:  Abner Greenspan, MD  Encounter Date: 09/18/2014      PT End of Session - 09/18/14 1847    Visit Number 1   Number of Visits 9   Date for PT Re-Evaluation 10/16/14   Authorization Type 1/10 g codes   PT Start Time 3220   PT Stop Time 1615   PT Time Calculation (min) 60 min   Activity Tolerance Patient tolerated treatment well   Behavior During Therapy [facility] for tasks assessed/performed      Past Medical History  Diagnosis Date  . GERD (gastroesophageal reflux disease)   . HTN (hypertension)   . Osteopenia   . Rheumatoid arthritis(714.0)   . Urine incontinence   . Epistaxis   . Allergic rhinitis, cause unspecified   . Other malaise and fatigue   . Other and unspecified hyperlipidemia   . Cramp of limb   . Diverticulosis     Past Surgical History  Procedure Laterality Date  . Tonsillectomy    . Cystoscopy  2006  . Colonoscopy  2003  . Mole removal      rectal  . Breast biopsy  2/11    fibrocystic change  . Breast biopsy  8/11    fibrocystic change    There were no vitals filed for this visit.  Visit Diagnosis:  Pain in joint, shoulder region, left  Cervicalgia      Subjective Assessment - 09/18/14 1834    Subjective Pt reports she [place] her right jaw and had bruising on the right side of her jaw and anterior neck which resolved in ~1 week.  Pt relates as she was falling she twisted her back and  tried to reach with her left arm to  hold on something but was unable.  pt relates since her fall, she has been having left upper trap pain and lateral shoulder pain. pt reports her pain has not improved since the fall.  pt  relates left cervical rotaion and horizontal shoulder movments increase pain.  pt reports NSAIDS help calm her pain but does not completely relieve pain.  pt denies any numbness and tingling.    Patient Stated Goals be pain free   Currently in Pain? Yes   Pain Score 5    Pain Location Other (Comment)  shoulder and upper trap   Pain Orientation Left   Pain Descriptors / Indicators Aching      UE dermatomes : decreased sensation on left with C4 and C5 UE myotomes: WNL, pain with left shoulder abduction   Bilateral shoulder AROM: WNL   Cervical AROM: Flexion: 30 Extension: 30 R Rotation: 60 L Rotation: 45 (pain in left upper trap)  Palpation: no pain along C2-T2 spinous process Pain in upper left trap (myofacial trigger point) and overall increased tone on left versus right Pain in middle left deltoid (myofacial trigger point)  Middle and lower trap MMT: R: 2+/5 on Left: 2/5   There ex:  Scapular retractions x10, pt required mod verbal and tactile cues for correct technique Upper left trap stretch in sitting 2x30 sec, pt required verbal cues for correct technique  Manual therapy:  Soft tissue mobilization to left  upper trap to decrease pain and tightness Decreased tension noted post treatment Pt noted decreased pain after treatment     [facility] PT Assessment - 09/19/14 0001    Assessment   Medical Diagnosis L neck / shoulder pain   Onset Date/Surgical Date --  "years"   Next MD Visit Unknown   Prior Therapy No   Precautions   Precautions None   Restrictions   Weight Bearing Restrictions No   Balance Screen   Has the patient fallen in the past 6 months Yes   How many times? 3 falls   Has the patient had a decrease in activity level because of a fear of falling?  No   Is the patient reluctant to leave their home because of a fear of falling?  No   Home Ecologist residence   Prior Function   Level of Independence Independent;Independent  with gait;Independent with transfers   Cognition   Overall Cognitive Status Within Functional Limits for tasks assessed   Observation/Other Assessments   Observations --   Activities of Balance Confidence Scale (ABC Scale)  --   Dizziness Handicap Inventory [facility])  --   Balance   Balance Assessed Yes   High Level Balance   High Level Balance Comments Firm/foam: >30 seconds in all positions except, 9.3 seconds Romberg foam eyes closed                           PT Education - 09/18/14 1846    Education provided Yes   Education Details plan of care, HEP   Person(s) Educated Patient   Methods Explanation;Demonstration   Comprehension Verbalized understanding;Returned demonstration             PT Long Term Goals - 09/18/14 1856    PT LONG TERM GOAL #1   Title pt will report less than 3/10 during house hold activities such as taking cloths out the dryer   Baseline pt reports >3/10 pain in upper left trap while taking cloths out the dryer   Time 4   Period Weeks   Status New   PT LONG TERM GOAL #2   Title pt's NDI score will improve by at least 8 points demonstrating improved functional ability    Baseline 28   Time 4   Period Weeks   Status New   PT LONG TERM GOAL #3   Title pt's left cervical rotation will improve to 60 degrees in order to improve driving ability    Baseline left:45 degrees   Time 4   Period Weeks               Plan - 09/18/14 1848    Clinical Impression Statement pt is a 81 year old female whole fell in July and since then has had left upper trap and middle deltoid pain.  pt presents with decreased cervical ROM, myofacial trigger points in left upper trap and middle deltoid and pt experienced decreased pain post manual therapy (soft tissue mobilization).  pt also presents with decreased middle and lower trap weakness which is likely contributing to her pain. Pt would benefit from skilled PT servies to decrease pain, decrease  myofacial trigger points, improve strength and ROM in order to return to prior level of function.    Pt will benefit from skilled therapeutic intervention in order to improve on the following deficits Decreased range of motion;Increased fascial restricitons;Pain;Impaired flexibility;Decreased strength;Postural dysfunction  Rehab Potential Good   PT Frequency 2x / week   PT Duration 3 weeks   PT Treatment/Interventions ADLs/Self Care Home Management;Aquatic Therapy;Biofeedback;Cryotherapy;Electrical Stimulation;Iontophoresis 4mg /ml Dexamethasone;Moist Heat;Therapeutic activities;Therapeutic exercise;Manual techniques;Patient/family education;Passive range of motion   PT Next Visit Plan manua therapy          G-Codes - 09/27/14 1903    Functional Assessment Tool Used NDI, history, clinical judgment   Functional Limitation Changing and maintaining body position   Changing and Maintaining Body Position Current Status (Z6109) At least 20 percent but less than 40 percent impaired, limited or restricted   Changing and Maintaining Body Position Goal Status (U0454) At least 1 percent but less than 20 percent impaired, limited or restricted       Problem List Patient Active Problem List   Diagnosis Date Noted  . Routine general medical examination at a health care facility 08/27/2014  . Colon cancer screening 08/27/2014  . Fall against object 08/15/2014  . Contusion of jaw 08/14/2014  . Thoracic back pain 08/14/2014  . Acute sinus infection 02/08/2014  . Vaccine reaction 08/03/2013  . Encounter for Medicare annual wellness exam 07/25/2013  . Varicosities of leg 05/23/2012  . Skin lesion of right lower limb 05/23/2012  . Abnormal ultrasound of thyroid gland 03/01/2011  . Hyperlipidemia 06/11/2008  . ALLERGIC RHINITIS 03/29/2008  . Essential hypertension, benign 12/26/2006  . GERD 12/26/2006  . RHEUMATOID ARTHRITIS 12/26/2006  . Osteopenia 12/26/2006  . URINARY INCONTINENCE 12/26/2006    Renford Dills, SPT This entire session was performed under direct supervision and direction of a licensed therapist/therapist assistant . I have personally read, edited and approve of the note as written. Gorden Harms. Tortorici, PT, DPT [phone]  Tortorici,Ashley 09/19/2014, 9:43 AM  [place] MAIN [facility] SERVICES [address] [place], [place], [address] Phone: [phone]   Fax:  [phone]

## 2014-09-18 NOTE — Patient Instructions (Signed)
[email or web address] Scapular retractions 2x10 Left upper trap stretch 3x30 sec

## 2014-09-23 ENCOUNTER — Other Ambulatory Visit: Payer: Self-pay | Admitting: Family Medicine

## 2014-09-24 ENCOUNTER — Ambulatory Visit: Payer: Medicare Other

## 2014-09-24 DIAGNOSIS — M25512 Pain in left shoulder: Secondary | ICD-10-CM

## 2014-09-24 DIAGNOSIS — M542 Cervicalgia: Secondary | ICD-10-CM

## 2014-09-24 DIAGNOSIS — R42 Dizziness and giddiness: Secondary | ICD-10-CM | POA: Diagnosis not present

## 2014-09-24 NOTE — Therapy (Signed)
[place] MAIN [facility] SERVICES [address] [place], [place], [address] Phone: [phone]   Fax:  [phone]  Physical Therapy Treatment  Patient Details  Name: Sierra Bentley MRN: [id number] Date of Birth: 03/20/1933 Referring Provider:  Abner Greenspan, MD  Encounter Date: 09/24/2014      PT End of Session - 09/24/14 1300    Visit Number 2   Number of Visits 9   Date for PT Re-Evaluation 10/16/14   Authorization Type 1/10 g codes   PT Start Time 0093   PT Stop Time 1346   PT Time Calculation (min) 41 min   Activity Tolerance Patient tolerated treatment well   Behavior During Therapy [facility] for tasks assessed/performed      Past Medical History  Diagnosis Date  . GERD (gastroesophageal reflux disease)   . HTN (hypertension)   . Osteopenia   . Rheumatoid arthritis(714.0)   . Urine incontinence   . Epistaxis   . Allergic rhinitis, cause unspecified   . Other malaise and fatigue   . Other and unspecified hyperlipidemia   . Cramp of limb   . Diverticulosis     Past Surgical History  Procedure Laterality Date  . Tonsillectomy    . Cystoscopy  2006  . Colonoscopy  2003  . Mole removal      rectal  . Breast biopsy  2/11    fibrocystic change  . Breast biopsy  8/11    fibrocystic change    There were no vitals filed for this visit.  Visit Diagnosis:  Pain in joint, shoulder region, left  Cervicalgia      Subjective Assessment - 09/24/14 1258    Subjective Pt reports she is having L sided neck pain upon arrival. She has been performing HEP but has difficulty recalling the exercises. After some time however she is able to recall the 2 exercises she is performing at home. No specific questions or concerns currently.    Pertinent History Pt reports she fell early [place] of this [place] and hit her right jaw and had bruising on the right side of her jaw and anterior neck which resolved in ~1 week.   Pt relates as she was falling she twisted her back and  tried to reach with her left arm to hold on something but was unable.  Pt relates since her fall, she has been having left upper trap pain and lateral shoulder pain. Pt reports her pain has not improved since the fall.  Pt relates left cervical rotaion and horizontal shoulder movments increase pain.  Pt reports NSAIDS help calm her pain but does not completely relieve pain.  Pt denies any numbness and tingling.    Patient Stated Goals Full resolution of pain   Currently in Pain? Yes   Pain Score 5    Pain Location Neck   Pain Orientation Left   Pain Descriptors / Indicators Aching   Multiple Pain Sites No       Manual therapy:  Supine cervical PROM for rotation and lateral flexion; Supine upper trap and levator stretches 30 sec x 3 each; Supine PROM retraction with oscillation; Extensive soft tissue mobilization to left upper trap, L posterior neck, and L levator scapulae; CPA and L UPA grade I C3-C7 30 sec/bout x 1 bout/level; Repeated cervical PROM reveals full, symmetrical, and pain-free cervical rotation; Pt educated about seated L levator stretch and provided written handout. Performed 30 sec  x 2 with patient.                          PT Education - 09/24/14 1300    Education provided Yes   Education Details HEP progression   Person(s) Educated Patient   Methods Explanation;Demonstration;Handout   Comprehension Verbalized understanding;Returned demonstration             PT Long Term Goals - 09/18/14 1856    PT LONG TERM GOAL #1   Title pt will report less than 3/10 during house hold activities such as taking cloths out the dryer   Baseline pt reports >3/10 pain in upper left trap while taking cloths out the dryer   Time 4   Period Weeks   Status New   PT LONG TERM GOAL #2   Title pt's NDI score will improve by at least 8 points demonstrating improved functional ability    Baseline 28    Time 4   Period Weeks   Status New   PT LONG TERM GOAL #3   Title pt's left cervical rotation will improve to 60 degrees in order to improve driving ability    Baseline left:45 degrees   Time 4   Period Weeks               Plan - 09/24/14 1300    Clinical Impression Statement Pt reports considerable improvement in L sided neck pain following therapy. Restoration of full AROM L cervical rotation at end of session without pain. Pt provided with levator stretch for L side to add to HEP. Continue additional HEP and follow-up as scheduled.    Pt will benefit from skilled therapeutic intervention in order to improve on the following deficits Decreased range of motion;Increased fascial restricitons;Pain;Impaired flexibility;Decreased strength;Postural dysfunction   Rehab Potential Good   PT Frequency 2x / week   PT Duration 3 weeks   PT Treatment/Interventions ADLs/Self Care Home Management;Aquatic Therapy;Biofeedback;Cryotherapy;Electrical Stimulation;Iontophoresis 4mg /ml Dexamethasone;Moist Heat;Therapeutic activities;Therapeutic exercise;Manual techniques;Patient/family education;Passive range of motion   PT Next Visit Plan progress manual therapy   PT Home Exercise Plan L upper trap and levator stretch, scapular retraction   Consulted and Agree with Plan of Care Patient        Problem List Patient Active Problem List   Diagnosis Date Noted  . Routine general medical examination at a health care facility 08/27/2014  . Colon cancer screening 08/27/2014  . Fall against object 08/15/2014  . Contusion of jaw 08/14/2014  . Thoracic back pain 08/14/2014  . Acute sinus infection 02/08/2014  . Vaccine reaction 08/03/2013  . Encounter for Medicare annual wellness exam 07/25/2013  . Varicosities of leg 05/23/2012  . Skin lesion of right lower limb 05/23/2012  . Abnormal ultrasound of thyroid gland 03/01/2011  . Hyperlipidemia 06/11/2008  . ALLERGIC RHINITIS 03/29/2008  . Essential  hypertension, benign 12/26/2006  . GERD 12/26/2006  . RHEUMATOID ARTHRITIS 12/26/2006  . Osteopenia 12/26/2006  . URINARY INCONTINENCE 12/26/2006   Phillips Grout PT, DPT   Starlett Pehrson 09/24/2014, 2:31 PM  [place] MAIN [facility] SERVICES [address] [place], [place], [address] Phone: [phone]   Fax:  [phone]

## 2014-09-24 NOTE — Patient Instructions (Signed)
Levator Scapula Stretch, Sitting   Sit, one hand tucked under hip on side to be stretched, other hand over top of head. Turn head toward other side and look down. Use hand on head to gently stretch neck in that position. Hold _30__ seconds. Repeat _3__ times per session. Do _2__ sessions per day.

## 2014-09-26 ENCOUNTER — Ambulatory Visit: Payer: Medicare Other | Attending: Otolaryngology

## 2014-09-26 DIAGNOSIS — M25512 Pain in left shoulder: Secondary | ICD-10-CM | POA: Insufficient documentation

## 2014-09-26 DIAGNOSIS — M542 Cervicalgia: Secondary | ICD-10-CM | POA: Diagnosis present

## 2014-09-27 NOTE — Therapy (Signed)
[place] MAIN [facility] SERVICES [address] [place], [place], [address] Phone: [phone]   Fax:  [phone]  Physical Therapy Treatment  Patient Details  Name: Sierra Bentley MRN: [id number] Date of Birth: 05-May-1933 Referring Provider:  Abner Greenspan, MD  Encounter Date: 09/26/2014      PT End of Session - 09/27/14 0938    Visit Number 3   Number of Visits 9   Date for PT Re-Evaluation 10/16/14   Authorization Type 3/10 g codes   PT Start Time 1300   PT Stop Time 1345   PT Time Calculation (min) 45 min   Activity Tolerance Patient tolerated treatment well   Behavior During Therapy [facility] for tasks assessed/performed      Past Medical History  Diagnosis Date  . GERD (gastroesophageal reflux disease)   . HTN (hypertension)   . Osteopenia   . Rheumatoid arthritis(714.0)   . Urine incontinence   . Epistaxis   . Allergic rhinitis, cause unspecified   . Other malaise and fatigue   . Other and unspecified hyperlipidemia   . Cramp of limb   . Diverticulosis     Past Surgical History  Procedure Laterality Date  . Tonsillectomy    . Cystoscopy  2006  . Colonoscopy  2003  . Mole removal      rectal  . Breast biopsy  2/11    fibrocystic change  . Breast biopsy  8/11    fibrocystic change    There were no vitals filed for this visit.  Visit Diagnosis:  Pain in joint, shoulder region, left  Cervicalgia      Subjective Assessment - 09/27/14 0935    Subjective Pt reports considerable improvement in L sided neck pain and L cervical rotation since the last therapy session. She is complaining of some L shoulder pain with abduction. Otherwise no specific questions or concerns.    Pertinent History Pt reports she fell early [place] of this [place] and hit her right jaw and had bruising on the right side of her jaw and anterior neck which resolved in ~1 week.  Pt relates as she was falling she twisted her  back and  tried to reach with her left arm to hold on something but was unable.  Pt relates since her fall, she has been having left upper trap pain and lateral shoulder pain. Pt reports her pain has not improved since the fall.  Pt relates left cervical rotaion and horizontal shoulder movments increase pain.  Pt reports NSAIDS help calm her pain but does not completely relieve pain.  Pt denies any numbness and tingling.    Patient Stated Goals Full resolution of pain   Currently in Pain? No/denies  Denies neck or L shoulder pain at rest.          Manual therapy: Extensive soft tissue mobilization to left upper trap, L posterior neck, and L levator scapulae; Assessed L shoulder pain with only mild and inconsistent pain to palpation along lateral shoulder near acromion, painful arc which improves with scapular assist and worsens with IR; AROM and PROM both painful; full strength in shoulder which is mostly painless with some inconsistent reports of pain. Symptoms most consistent with external impingement or possible subacromial bursitis, Pt is not an excellent historian; L shoulder AP mobs at neutral 20-30 sec/bout, grade III, 3 bouts; L shoulder inferior mobs at 90 scaption 20-30 sec/bout, grade III, 3 bouts; MWM  with belt and inferior glide during scaption 10 oscillations x 2; Pt reports mild improvement in pain following MWM;                         PT Education - 09/27/14 0938    Education provided Yes   Education Details HEP reinforced   Person(s) Educated Patient   Methods Explanation;Demonstration   Comprehension Verbalized understanding;Returned demonstration             PT Long Term Goals - 09/18/14 1856    PT LONG TERM GOAL #1   Title pt will report less than 3/10 during house hold activities such as taking cloths out the dryer   Baseline pt reports >3/10 pain in upper left trap while taking cloths out the dryer   Time 4   Period Weeks   Status New    PT LONG TERM GOAL #2   Title pt's NDI score will improve by at least 8 points demonstrating improved functional ability    Baseline 28   Time 4   Period Weeks   Status New   PT LONG TERM GOAL #3   Title pt's left cervical rotation will improve to 60 degrees in order to improve driving ability    Baseline left:45 degrees   Time 4   Period Weeks               Plan - 09/27/14 0939    Clinical Impression Statement Pt reports improvement in L neck pain with L cervical rotation    Pt will benefit from skilled therapeutic intervention in order to improve on the following deficits Decreased range of motion;Increased fascial restricitons;Pain;Impaired flexibility;Decreased strength;Postural dysfunction   Rehab Potential Good   PT Frequency 2x / week   PT Duration 3 weeks   PT Treatment/Interventions ADLs/Self Care Home Management;Aquatic Therapy;Biofeedback;Cryotherapy;Electrical Stimulation;Iontophoresis 4mg /ml Dexamethasone;Moist Heat;Therapeutic activities;Therapeutic exercise;Manual techniques;Patient/family education;Passive range of motion   PT Next Visit Plan progress manual therapy   PT Home Exercise Plan L upper trap and levator stretch, scapular retraction   Consulted and Agree with Plan of Care Patient        Problem List Patient Active Problem List   Diagnosis Date Noted  . Routine general medical examination at a health care facility 08/27/2014  . Colon cancer screening 08/27/2014  . Fall against object 08/15/2014  . Contusion of jaw 08/14/2014  . Thoracic back pain 08/14/2014  . Acute sinus infection 02/08/2014  . Vaccine reaction 08/03/2013  . Encounter for Medicare annual wellness exam 07/25/2013  . Varicosities of leg 05/23/2012  . Skin lesion of right lower limb 05/23/2012  . Abnormal ultrasound of thyroid gland 03/01/2011  . Hyperlipidemia 06/11/2008  . ALLERGIC RHINITIS 03/29/2008  . Essential hypertension, benign 12/26/2006  . GERD 12/26/2006  .  RHEUMATOID ARTHRITIS 12/26/2006  . Osteopenia 12/26/2006  . URINARY INCONTINENCE 12/26/2006   Phillips Grout PT, DPT   Garlon Tuggle 09/27/2014, 9:43 AM  [place] MAIN [facility] SERVICES [address] [place], [place], [address] Phone: [phone]   Fax:  [phone]

## 2014-10-02 ENCOUNTER — Ambulatory Visit: Payer: Medicare Other

## 2014-10-02 DIAGNOSIS — M542 Cervicalgia: Secondary | ICD-10-CM

## 2014-10-02 DIAGNOSIS — M25512 Pain in left shoulder: Secondary | ICD-10-CM

## 2014-10-02 NOTE — Therapy (Signed)
[place] MAIN [facility] SERVICES [address] [place], [place], [address] Phone: [phone]   Fax:  [phone]  Physical Therapy Treatment  Patient Details  Name: Sierra Bentley MRN: [id number] Date of Birth: [birth year] Referring Provider:  Abner Greenspan, MD  Encounter Date: 10/02/2014      PT End of Session - 10/02/14 1901    Visit Number 4   Number of Visits 9   Date for PT Re-Evaluation 10/16/14   Authorization Type 4/10 g codes   PT Start Time 9528   PT Stop Time 1730   PT Time Calculation (min) 45 min   Activity Tolerance Patient tolerated treatment well   Behavior During Therapy [facility] for tasks assessed/performed      Past Medical History  Diagnosis Date  . GERD (gastroesophageal reflux disease)   . HTN (hypertension)   . Osteopenia   . Rheumatoid arthritis(714.0)   . Urine incontinence   . Epistaxis   . Allergic rhinitis, cause unspecified   . Other malaise and fatigue   . Other and unspecified hyperlipidemia   . Cramp of limb   . Diverticulosis     Past Surgical History  Procedure Laterality Date  . Tonsillectomy    . Cystoscopy  2006  . Colonoscopy  2003  . Mole removal      rectal  . Breast biopsy  2/11    fibrocystic change  . Breast biopsy  8/11    fibrocystic change    There were no vitals filed for this visit.  Visit Diagnosis:  Cervicalgia  Pain in joint, shoulder region, left      Subjective Assessment - 10/02/14 1900    Subjective Pt reports she still has neck pain but has improved left cervical rotation range. She also notes the pain in her left upper trap is intermittent. Pt denies any pain currently.  Pt relates her left arm was sore after her last session and it feels better today.    Pertinent History Pt reports she [place] her [place] jaw and had bruising on the right side of her jaw and anterior neck which resolved in ~1 week.  Pt  relates as she was falling she twisted her back and  tried to reach with her left arm to hold on something but was unable.  Pt relates since her fall, she has been having left upper trap pain and lateral shoulder pain. Pt reports her pain has not improved since the fall.  Pt relates left cervical rotaion and horizontal shoulder movments increase pain.  Pt reports NSAIDS help calm her pain but does not completely relieve pain.  Pt denies any numbness and tingling.    Patient Stated Goals Full resolution of pain   Currently in Pain? No/denies   Pain Score 0-No pain       Manual therapy:   Extensive soft tissue mobilization including efflurage and ischemic trigger point release to left upper trap, L posterior neck, and L levator scapulae Pt noted decreased pain and less tension in left upper neck musculature   There ex: Left upper trap and levator scapulae stretch 3x30 sec each Shoulder rows with red band 2x10 Shoulder extension with yellow band 2x10  Pt required min verbal, tactile and visual cues for correct exercise technique   Cervical rotation ROM:  Right: 60 Left: 56 degrees  PT Education - 10/02/14 1901    Education provided Yes   Education Details HEP reinforced and progression    Person(s) Educated Patient   Methods Explanation;Demonstration   Comprehension Verbalized understanding;Returned demonstration             PT Long Term Goals - 09/18/14 1856    PT LONG TERM GOAL #1   Title pt will report less than 3/10 during house hold activities such as taking cloths out the dryer   Baseline pt reports >3/10 pain in upper left trap while taking cloths out the dryer   Time 4   Period Weeks   Status New   PT LONG TERM GOAL #2   Title pt's NDI score will improve by at least 8 points demonstrating improved functional ability    Baseline 28   Time 4   Period Weeks   Status New   PT LONG TERM GOAL #3   Title pt's left cervical  rotation will improve to 60 degrees in order to improve driving ability    Baseline left:45 degrees   Time 4   Period Weeks               Plan - 10/02/14 1905    Clinical Impression Statement Pt presents with improved cervical ROM and minimal pain in her upper trap throughout session.  pt responded well to manual therapy by reporting decreased pain.  Reviewed HEP for proper exercise technique and progressed HEP to strengthen mid/lower trap.     Pt will benefit from skilled therapeutic intervention in order to improve on the following deficits Decreased range of motion;Increased fascial restricitons;Pain;Impaired flexibility;Decreased strength;Postural dysfunction   Rehab Potential Good   PT Frequency 2x / week   PT Duration 3 weeks   PT Treatment/Interventions ADLs/Self Care Home Management;Aquatic Therapy;Biofeedback;Cryotherapy;Electrical Stimulation;Iontophoresis 4mg /ml Dexamethasone;Moist Heat;Therapeutic activities;Therapeutic exercise;Manual techniques;Patient/family education;Passive range of motion   PT Next Visit Plan progress manual therapy   PT Home Exercise Plan L upper trap and levator stretch, shoulder rows and extension    Consulted and Agree with Plan of Care Patient        Problem List Patient Active Problem List   Diagnosis Date Noted  . Routine general medical examination at a health care facility 08/27/2014  . Colon cancer screening 08/27/2014  . Fall against object 08/15/2014  . Contusion of jaw 08/14/2014  . Thoracic back pain 08/14/2014  . Acute sinus infection 02/08/2014  . Vaccine reaction 08/03/2013  . Encounter for Medicare annual wellness exam 07/25/2013  . Varicosities of leg 05/23/2012  . Skin lesion of right lower limb 05/23/2012  . Abnormal ultrasound of thyroid gland 03/01/2011  . Hyperlipidemia 06/11/2008  . ALLERGIC RHINITIS 03/29/2008  . Essential hypertension, benign 12/26/2006  . GERD 12/26/2006  . RHEUMATOID ARTHRITIS 12/26/2006   . Osteopenia 12/26/2006  . URINARY INCONTINENCE 12/26/2006   Renford Dills, SPT  Tortorici,Ashley 10/03/2014, 8:10 AM  [place] MAIN [facility] SERVICES [address] [place], [place], [address] Phone: [phone]   Fax:  [phone]

## 2014-10-02 NOTE — Patient Instructions (Signed)
[email or web address] Levator scapulae stretch 2x30 sec Shoulder rows with red band 2x10 Shoulder extension with yellow band 2x10

## 2014-10-07 ENCOUNTER — Ambulatory Visit: Payer: Medicare Other

## 2014-10-07 DIAGNOSIS — M542 Cervicalgia: Secondary | ICD-10-CM

## 2014-10-07 DIAGNOSIS — M25512 Pain in left shoulder: Secondary | ICD-10-CM | POA: Diagnosis not present

## 2014-10-07 NOTE — Therapy (Signed)
[place] MAIN [facility] SERVICES [address] [place], [place], [address] Phone: [phone]   Fax:  [phone]  Physical Therapy Treatment  Patient Details  Name: Sierra Bentley MRN: [id number] Date of Birth: 1933/12/31 Referring Provider:  Carloyn Manner, MD  Encounter Date: 10/07/2014      PT End of Session - 10/07/14 1444    Visit Number 5   Number of Visits 9   Date for PT Re-Evaluation 10/16/14   Authorization Type 5/10 g codes   PT Start Time 5329   PT Stop Time 1431   PT Time Calculation (min) 38 min   Activity Tolerance Patient tolerated treatment well   Behavior During Therapy [facility] for tasks assessed/performed      Past Medical History  Diagnosis Date  . GERD (gastroesophageal reflux disease)   . HTN (hypertension)   . Osteopenia   . Rheumatoid arthritis(714.0)   . Urine incontinence   . Epistaxis   . Allergic rhinitis, cause unspecified   . Other malaise and fatigue   . Other and unspecified hyperlipidemia   . Cramp of limb   . Diverticulosis     Past Surgical History  Procedure Laterality Date  . Tonsillectomy    . Cystoscopy  2006  . Colonoscopy  2003  . Mole removal      rectal  . Breast biopsy  2/11    fibrocystic change  . Breast biopsy  8/11    fibrocystic change    There were no vitals filed for this visit.  Visit Diagnosis:  Cervicalgia  Pain in joint, shoulder region, left      Subjective Assessment - 10/07/14 1442    Subjective pt reports she is not having much pain and after her shoulder has felt better since the last session.  pt experienced 3-4/10 pain last but currently has no pain even with cervical rotations.  Pt feels like she has at least improved 75% since the start of PT.     Pertinent History Pt reports she [place] early [place] and [place] her right jaw and had bruising on the right side of her jaw and anterior neck which resolved in ~1 week.   Pt relates as she was falling she twisted her back and  tried to reach with her left arm to hold on something but was unable.  Pt relates since her fall, she has been having left upper trap pain and lateral shoulder pain. Pt reports her pain has not improved since the fall.  Pt relates left cervical rotaion and horizontal shoulder movments increase pain.  Pt reports NSAIDS help calm her pain but does not completely relieve pain.  Pt denies any numbness and tingling.    Patient Stated Goals Full resolution of pain   Currently in Pain? No/denies   Pain Score 0-No pain        Manual therapy:   Extensive soft tissue mobilization including efflurage and ischemic trigger point release to left upper trap, L posterior neck, and L levator scapulae Pt noted decreased pain and less tension in left upper neck musculature   There ex: Left upper trap and levator scapulae stretch 1x30 sec each Cervical retractions in sitting 2x10 SPT educated pt on forward head posture and it's effect on shoulder ROM and neck pain, using towel roll at night, reading material at eye level,  Pt required mod verbal, tactile and visual cues for correct exercise technique  Cervical rotation ROM:  Right: 60 Left: 60degrees     NDI: 2% = no disability                     PT Education - 10/07/14 1442    Education provided Yes   Education Details plan of care (discharge next session), forward head posture, towel roll in pillow case, cervical retractions    Person(s) Educated Patient   Methods Explanation;Demonstration;Verbal cues;Handout   Comprehension Verbalized understanding;Returned demonstration             PT Long Term Goals - 09/18/14 1856    PT LONG TERM GOAL #1   Title pt will report less than 3/10 during house hold activities such as taking cloths out the dryer   Baseline pt reports >3/10 pain in upper left trap while taking cloths out the dryer   Time 4   Period Weeks   Status  New   PT LONG TERM GOAL #2   Title pt's NDI score will improve by at least 8 points demonstrating improved functional ability    Baseline 28   Time 4   Period Weeks   Status New   PT LONG TERM GOAL #3   Title pt's left cervical rotation will improve to 60 degrees in order to improve driving ability    Baseline left:45 degrees   Time 4   Period Weeks               Plan - 10/07/14 1453    Clinical Impression Statement Session consisted on patient education regarding plan of care, forward head posture and its impact on neck pain, brining reading material at eye level versus extended amount of time in cervical flexion, using towel roll at night for maintaining cervical lordosis.  Pt responded well to education and cervical retractions and manual therapy by experiencing decreased tension in her left upper trap, levator scapulae and surrounding musculature. pt achieved ROM goal today with bilateral cervical rotation 60 degrees of ROM.  Pt would benefit from continued skilled PT services in order to become independent with HEP to self-manage her symptoms and improve remaining deficits.       Pt will benefit from skilled therapeutic intervention in order to improve on the following deficits Decreased range of motion;Increased fascial restricitons;Pain;Impaired flexibility;Decreased strength;Postural dysfunction   Rehab Potential Good   PT Frequency 2x / week   PT Duration 3 weeks   PT Treatment/Interventions ADLs/Self Care Home Management;Aquatic Therapy;Biofeedback;Cryotherapy;Electrical Stimulation;Iontophoresis 4mg /ml Dexamethasone;Moist Heat;Therapeutic activities;Therapeutic exercise;Manual techniques;Patient/family education;Passive range of motion   PT Next Visit Plan discharge   Consulted and Agree with Plan of Care Patient        Problem List Patient Active Problem List   Diagnosis Date Noted  . Routine general medical examination at a health care facility 08/27/2014  . Colon  cancer screening 08/27/2014  . Fall against object 08/15/2014  . Contusion of jaw 08/14/2014  . Thoracic back pain 08/14/2014  . Acute sinus infection 02/08/2014  . Vaccine reaction 08/03/2013  . Encounter for Medicare annual wellness exam 07/25/2013  . Varicosities of leg 05/23/2012  . Skin lesion of right lower limb 05/23/2012  . Abnormal ultrasound of thyroid gland 03/01/2011  . Hyperlipidemia 06/11/2008  . ALLERGIC RHINITIS 03/29/2008  . Essential hypertension, benign 12/26/2006  . GERD 12/26/2006  . RHEUMATOID ARTHRITIS 12/26/2006  . Osteopenia 12/26/2006  . URINARY INCONTINENCE 12/26/2006   Renford Dills, SPT This entire session was performed under direct supervision and  direction of a licensed Chiropractor . I have personally read, edited and approve of the note as written. Gorden Harms. Tortorici, PT, DPT [phone]  Tortorici,Ashley 10/07/2014, 4:53 PM  [place] MAIN [facility] SERVICES [address] [place], [place], [address] Phone: [phone]   Fax:  [phone]

## 2014-10-07 NOTE — Patient Instructions (Signed)
[email or web address] Cervical retractions in sitting 2x10

## 2014-10-09 ENCOUNTER — Ambulatory Visit: Payer: Medicare Other

## 2014-10-09 DIAGNOSIS — M542 Cervicalgia: Secondary | ICD-10-CM

## 2014-10-09 DIAGNOSIS — M25512 Pain in left shoulder: Secondary | ICD-10-CM

## 2014-10-09 NOTE — Therapy (Signed)
[place] MAIN [facility] SERVICES [address] [place], [place], [address] Phone: [phone]   Fax:  [phone]  Physical Therapy Treatment / DC summary  Patient Details  Name: Sierra Bentley MRN: [id number] Date of Birth: [birth year] Referring Provider:  Abner Greenspan, MD  Encounter Date: 10/09/2014      PT End of Session - 10/09/14 1325    Visit Number 6   Number of Visits 9   Date for PT Re-Evaluation 10/16/14   Authorization Type 5/10 g codes   PT Start Time 1300   PT Stop Time 1327   PT Time Calculation (min) 27 min   Activity Tolerance Patient tolerated treatment well   Behavior During Therapy [facility] for tasks assessed/performed      Past Medical History  Diagnosis Date  . GERD (gastroesophageal reflux disease)   . HTN (hypertension)   . Osteopenia   . Rheumatoid arthritis(714.0)   . Urine incontinence   . Epistaxis   . Allergic rhinitis, cause unspecified   . Other malaise and fatigue   . Other and unspecified hyperlipidemia   . Cramp of limb   . Diverticulosis     Past Surgical History  Procedure Laterality Date  . Tonsillectomy    . Cystoscopy  2006  . Colonoscopy  2003  . Mole removal      rectal  . Breast biopsy  2/11    fibrocystic change  . Breast biopsy  8/11    fibrocystic change    There were no vitals filed for this visit.  Visit Diagnosis:  Cervicalgia  Pain in joint, shoulder region, left      Subjective Assessment - 10/09/14 1324    Subjective pt reports she feels 85% better and she is able to manage her shoulder pain at home. she reports having 2/10 pain this AM, but did her stretches and her pain is now 0/10   Pertinent History Pt reports she fell early July of this [place] and hit her right jaw and had bruising on the right side of her jaw and anterior neck which resolved in ~1 week.  Pt relates as she was falling she twisted her back and  tried to reach with her  left arm to hold on something but was unable.  Pt relates since her fall, she has been having left upper trap pain and lateral shoulder pain. Pt reports her pain has not improved since the fall.  Pt relates left cervical rotaion and horizontal shoulder movments increase pain.  Pt reports NSAIDS help calm her pain but does not completely relieve pain.  Pt denies any numbness and tingling.    Patient Stated Goals Full resolution of pain   Currently in Pain? No/denies      There ex: Pt education and pt performance of supine lying with cervical roll in place. Lumbar roll in place sitting with min cues for posture x 4 min Left upper trap and levator scapulae stretch 1x30 sec each min cues for head positioning  Cervical retractions in sitting 2x10 min cues for retraction vs flexion  Shoulder horiz abduction with red band 2x10 with min cues Shoulder diagonals yellow band 2x10 min cues for elbows straight  Cervical rotation ROM:  Right: 60 Left: 60degrees     NDI: 2% = no disability  PT Education - October 29, 2014 1325    Education provided Yes   Education Details towel roll in pillow, and for lumbar support   Person(s) Educated Patient   Methods Explanation;Demonstration   Comprehension Verbalized understanding             PT Long Term Goals - 10-29-14 1326    PT LONG TERM GOAL #1   Title pt will report less than 3/10 during house hold activities such as taking cloths out the dryer   Baseline pt reports >3/10 pain in upper left trap while taking cloths out the dryer   Time 4   Period Weeks   Status Achieved   PT LONG TERM GOAL #2   Title pt's NDI score will improve by at least 8 points demonstrating improved functional ability    Baseline 10   Time 4   Period Weeks   Status Achieved   PT LONG TERM GOAL #3   Title pt's left cervical rotation will improve to 60 degrees in order to improve driving ability    Time 4   Period  Weeks   Status Achieved   PT LONG TERM GOAL #4   Title Pt will be independent with HEP in order to decrease symptoms and improve function at home by 09/11/14   Status Achieved               Plan - 2014/10/29 1326    Clinical Impression Statement pt has met her PT goals at this time with good HEP demonstration and home plan. she is able to manage her symptoms independently now and is no longer in need of skilled PT services. pt will be DC to HEP today.   Pt will benefit from skilled therapeutic intervention in order to improve on the following deficits Decreased range of motion;Increased fascial restricitons;Pain;Impaired flexibility;Decreased strength;Postural dysfunction   Rehab Potential Good   PT Frequency 2x / week   PT Duration 3 weeks   PT Treatment/Interventions ADLs/Self Care Home Management;Aquatic Therapy;Biofeedback;Cryotherapy;Electrical Stimulation;Iontophoresis 74m/ml Dexamethasone;Moist Heat;Therapeutic activities;Therapeutic exercise;Manual techniques;Patient/family education;Passive range of motion   PT Next Visit Plan discharge   Consulted and Agree with Plan of Care Patient          G-Codes - 0Oct 04, 20161327    Functional Assessment Tool Used NDI, history, clinical judgment   Functional Limitation Changing and maintaining body position   Changing and Maintaining Body Position Current Status ((Q3335 At least 1 percent but less than 20 percent impaired, limited or restricted   Changing and Maintaining Body Position Goal Status ((K5625 At least 1 percent but less than 20 percent impaired, limited or restricted   Changing and Maintaining Body Position Discharge Status ((W3893 At least 1 percent but less than 20 percent impaired, limited or restricted      Problem List Patient Active Problem List   Diagnosis Date Noted  . Routine general medical examination at a health care facility 08/27/2014  . Colon cancer screening 08/27/2014  . Fall against object 08/15/2014   . Contusion of jaw 08/14/2014  . Thoracic back pain 08/14/2014  . Acute sinus infection 02/08/2014  . Vaccine reaction 08/03/2013  . Encounter for Medicare annual wellness exam 07/25/2013  . Varicosities of leg 05/23/2012  . Skin lesion of right lower limb 05/23/2012  . Abnormal ultrasound of thyroid gland 03/01/2011  . Hyperlipidemia 06/11/2008  . ALLERGIC RHINITIS 03/29/2008  . Essential hypertension, benign 12/26/2006  . GERD 12/26/2006  . RHEUMATOID ARTHRITIS 12/26/2006  . Osteopenia 12/26/2006  .  URINARY INCONTINENCE 12/26/2006   Gorden Harms. Lindie Roberson, PT, DPT [phone]  Jarome Trull 10/09/2014, 1:28 PM  [place] MAIN [facility] SERVICES [address] [place], [place], [address] Phone: [phone]   Fax:  [phone]

## 2014-12-23 ENCOUNTER — Ambulatory Visit (INDEPENDENT_AMBULATORY_CARE_PROVIDER_SITE_OTHER): Payer: Medicare Other | Admitting: Internal Medicine

## 2014-12-23 ENCOUNTER — Encounter: Payer: Self-pay | Admitting: Internal Medicine

## 2014-12-23 VITALS — BP 126/64 | HR 84 | Temp 97.5°F | Wt 150.0 lb

## 2014-12-23 DIAGNOSIS — J209 Acute bronchitis, unspecified: Secondary | ICD-10-CM

## 2014-12-23 MED ORDER — AZITHROMYCIN 250 MG PO TABS: ORAL_TABLET | ORAL | Status: DC

## 2014-12-23 MED ORDER — BENZONATATE 200 MG PO CAPS: 200.0000 mg | ORAL_CAPSULE | Freq: Two times a day (BID) | ORAL | Status: DC | PRN

## 2014-12-23 NOTE — Patient Instructions (Signed)

## 2014-12-23 NOTE — Progress Notes (Signed)
Pre visit review using our clinic review tool, if applicable. No additional management support is needed unless otherwise documented below in the visit note. 

## 2014-12-23 NOTE — Progress Notes (Signed)
HPI  Pt presents to the clinic today with c/o cough and chest congestion. This started 3 weeks ago. The cough is productive of brown mucous at times. She has felt a little shortness of breath. She denies fever, chills or body aches. She has tried Mucinex, Tylenol cold and allergy and Tussin DM with minimal relief. She has no history of allergies or breathing problems. She has had sick contacts.  Review of Systems      Past Medical History  Diagnosis Date  . GERD (gastroesophageal reflux disease)   . HTN (hypertension)   . Osteopenia   . Rheumatoid arthritis(714.0)   . Urine incontinence   . Epistaxis   . Allergic rhinitis, cause unspecified   . Other malaise and fatigue   . Other and unspecified hyperlipidemia   . Cramp of limb   . Diverticulosis     Family History  Problem Relation Age of Onset  . Heart attack Father 84  . Coronary artery disease Father   . Other Mother     Arrythmia  . Coronary artery disease Mother   . Hypertension Mother   . Anxiety disorder Mother   . Breast cancer      2nd cousin  . Depression      family history    Social History   Social History  . Marital Status: Married    Spouse Name: N/A  . Number of Children: N/A  . Years of Education: N/A   Occupational History  . Not on file.   Social History Main Topics  . Smoking status: Never Smoker   . Smokeless tobacco: Never Used  . Alcohol Use: No  . Drug Use: No  . Sexual Activity: Not on file   Other Topics Concern  . Not on file   Social History Narrative   Married      No biological children (Has step children)      Taught HS x 30 years      Regular exercise          Allergies  Allergen Reactions  . Ace Inhibitors     REACTION: cough  . Alendronate Sodium     REACTION: reflux, trouble swallowing  . Chocolate   . Dust Mite Extract   . Ibandronate Sodium     REACTION: reflux and increased BP     Constitutional: Positive headache, fatigue. Denies fever or  abrupt weight changes.  HEENT:  Positive sore throat. Denies eye redness, eye pain, pressure behind the eyes, facial pain, nasal congestion, ear pain, ringing in the ears, wax buildup, runny nose or bloody nose. Respiratory: Positive cough and shortness of breath. Denies difficulty breathing.  Cardiovascular: Denies chest pain, chest tightness, palpitations or swelling in the hands or feet.   No other specific complaints in a complete review of systems (except as listed in HPI above).  Objective:   BP 126/64 mmHg  Pulse 84  Temp(Src) 97.5 F (36.4 C) (Oral)  Wt 150 lb (68.04 kg)  SpO2 97%  Wt Readings from Last 3 Encounters:  12/23/14 150 lb (68.04 kg)  08/27/14 149 lb 8 oz (67.813 kg)  08/14/14 151 lb (68.493 kg)     General: Appears her stated age, in NAD. HEENT: Head: normal shape and size, no sinus tenderness noted; Eyes: sclera white, no icterus, conjunctiva pink; Ears: Tm's gray and intact, normal light reflex; Nose: mucosa pink and moist, septum midline; Throat/Mouth: Teeth present, mucosa pink and moist, no exudate noted, no lesions or ulcerations  noted.  Neck: Cervical lymphadenopathy noted.  Cardiovascular: Normal rate and rhythm. S1,S2 noted.  No murmur, rubs or gallops noted.  Pulmonary/Chest: Normal effort and scattered rhonchi throughout. No respiratory distress. No wheezes, rales noted.      Assessment & Plan:   Acute Bronchitis:  Get some rest and drink plenty of water Do salt water gargles for the sore throat eRx for Azithromax x 5 days eRx for Benzonate 200 mg cap TID prn for cough  RTC as needed or if symptoms persist.

## 2015-01-03 ENCOUNTER — Ambulatory Visit (INDEPENDENT_AMBULATORY_CARE_PROVIDER_SITE_OTHER): Payer: Medicare Other

## 2015-01-03 DIAGNOSIS — Z23 Encounter for immunization: Secondary | ICD-10-CM

## 2015-08-04 ENCOUNTER — Ambulatory Visit (INDEPENDENT_AMBULATORY_CARE_PROVIDER_SITE_OTHER): Payer: Medicare Other | Admitting: Family Medicine

## 2015-08-04 ENCOUNTER — Ambulatory Visit (INDEPENDENT_AMBULATORY_CARE_PROVIDER_SITE_OTHER)
Admission: RE | Admit: 2015-08-04 | Discharge: 2015-08-04 | Disposition: A | Discharge status: Home / Self Care | Payer: Medicare Other | Source: Ambulatory Visit | Attending: Family Medicine | Admitting: Family Medicine

## 2015-08-04 ENCOUNTER — Encounter: Payer: Self-pay | Admitting: Family Medicine

## 2015-08-04 VITALS — BP 128/64 | HR 74 | Temp 97.4°F | Ht 64.5 in | Wt 153.5 lb

## 2015-08-04 DIAGNOSIS — M542 Cervicalgia: Secondary | ICD-10-CM

## 2015-08-04 MED ORDER — CYCLOBENZAPRINE HCL 10 MG PO TABS: 10.0000 mg | ORAL_TABLET | Freq: Every evening | ORAL | Status: DC | PRN

## 2015-08-04 NOTE — Progress Notes (Signed)
Subjective:    Patient ID: Sierra Bentley, female    DOB: 1933-06-06, 82 y.o.   MRN: [id number]  HPI Here for neck pain for 1-2 months   She had some sinus issues with a headache that then radiated to L trapezius area  Took aleve 10 days and went away Started back after she stopped it   Located behind ear to L shoulder - and now radiating to the middle of her neck Sometimes causes a HA/other times not  Now it is painful to turn her head to the left - like a crick in it that won't go away   No new activities or pillows This has never happened before  No radiation to arm or hand (just shoulder)  No numbness or weakness of UEs   Patient Active Problem List   Diagnosis Date Noted  . Neck pain on left side 08/04/2015  . Routine general medical examination at a health care facility 08/27/2014  . Colon cancer screening 08/27/2014  . Fall against object 08/15/2014  . Contusion of jaw 08/14/2014  . Thoracic back pain 08/14/2014  . Acute sinus infection 02/08/2014  . Vaccine reaction 08/03/2013  . Encounter for Medicare annual wellness exam 07/25/2013  . Varicosities of leg 05/23/2012  . Skin lesion of right lower limb 05/23/2012  . Abnormal ultrasound of thyroid gland 03/01/2011  . Hyperlipidemia 06/11/2008  . ALLERGIC RHINITIS 03/29/2008  . Essential hypertension, benign 12/26/2006  . GERD 12/26/2006  . RHEUMATOID ARTHRITIS 12/26/2006  . Osteopenia 12/26/2006  . URINARY INCONTINENCE 12/26/2006   Past Medical History  Diagnosis Date  . GERD (gastroesophageal reflux disease)   . HTN (hypertension)   . Osteopenia   . Rheumatoid arthritis(714.0)   . Urine incontinence   . Epistaxis   . Allergic rhinitis, cause unspecified   . Other malaise and fatigue   . Other and unspecified hyperlipidemia   . Cramp of limb   . Diverticulosis    Past Surgical History  Procedure Laterality Date  . Tonsillectomy    . Cystoscopy  2006  . Colonoscopy  2003  . Mole removal     rectal  . Breast biopsy  2/11    fibrocystic change  . Breast biopsy  8/11    fibrocystic change   Social History  Substance Use Topics  . Smoking status: Never Smoker   . Smokeless tobacco: Never Used  . Alcohol Use: No   Family History  Problem Relation Age of Onset  . Heart attack Father 22  . Coronary artery disease Father   . Other Mother     Arrythmia  . Coronary artery disease Mother   . Hypertension Mother   . Anxiety disorder Mother   . Breast cancer      2nd cousin  . Depression      family history   Allergies  Allergen Reactions  . Ace Inhibitors     REACTION: cough  . Alendronate Sodium     REACTION: reflux, trouble swallowing  . Chocolate   . Dust Mite Extract   . Ibandronate Sodium     REACTION: reflux and increased BP   Current Outpatient Prescriptions on File Prior to Visit  Medication Sig Dispense Refill  . azithromycin (ZITHROMAX) 250 MG tablet Take 2 tabs today, then 1 tab daily x 4 days 6 tablet 0  . benzonatate (TESSALON) 200 MG capsule Take 1 capsule (200 mg total) by mouth 2 (two) times daily as needed for cough. 20 capsule  0  . estradiol (ESTRACE) 0.1 MG/GM vaginal cream Place 2 g vaginally daily as needed.      . fexofenadine (ALLEGRA) 180 MG tablet Take 180 mg by mouth daily as needed.      . fish oil-omega-3 fatty acids 1000 MG capsule Take 1 g by mouth daily.    Marland Kitchen losartan-hydrochlorothiazide (HYZAAR) 50-12.5 MG per tablet TAKE 0.5 TABLET (1/2TABLET) BY MOUTH DAILY. 45 tablet 3  . mometasone (NASONEX) 50 MCG/ACT nasal spray Place 1 spray into the nose daily as needed. 17 g 11  . Multiple Vitamin (MULTIVITAMIN) tablet Take 1 tablet by mouth daily.      . pantoprazole (PROTONIX) 40 MG tablet   11  . Polyethyl Glycol-Propyl Glycol (SYSTANE ULTRA OP) Place 1 drop into both eyes 4 (four) times daily.      . RESTASIS 0.05 % ophthalmic emulsion   2  . sodium chloride (MURO 128) 5 % ophthalmic ointment Place 1 drop into both eyes as needed.        No current facility-administered medications on file prior to visit.    Review of Systems Review of Systems  Constitutional: Negative for fever, appetite change,  and unexpected weight change.  ENt pos for rhinorrhea/cong and occ sinus pressure, neg for temple pain  Eyes: Negative for pain and visual disturbance.  Respiratory: Negative for cough and shortness of breath.   Cardiovascular: Negative for cp or palpitations    Gastrointestinal: Negative for nausea, diarrhea and constipation.  Genitourinary: Negative for urgency and frequency.  Skin: Negative for pallor or rash   MSK pos for neck pain  Neurological: Negative for weakness, light-headedness, numbness and headaches.  Hematological: Negative for adenopathy. Does not bruise/bleed easily.  Psychiatric/Behavioral: Negative for dysphoric mood. The patient is not nervous/anxious.         Objective:   Physical Exam  Constitutional: She appears well-developed and well-nourished. No distress.  Well appearing  HENT:  Head: Normocephalic and atraumatic.  Nares are boggy  Eyes: Conjunctivae and EOM are normal. Pupils are equal, round, and reactive to light. No scleral icterus.  Neck: Neck supple. No JVD present. Carotid bruit is not present. No tracheal deviation present. No thyromegaly present.  Cardiovascular: Normal rate and regular rhythm.   Pulmonary/Chest: Effort normal and breath sounds normal.  Musculoskeletal: She exhibits tenderness. She exhibits no edema.       Cervical back: She exhibits decreased range of motion, tenderness, bony tenderness and spasm. She exhibits no swelling, no edema and no deformity.  Limited rom of neck - pain to rotate L and fully extend No crepitus  Tender over lower CS Tender over L paracervical musculature and trapezius  Nl rom shoulders and upper ext  No neuro changes   Lymphadenopathy:    She has no cervical adenopathy.  Neurological: She has normal strength. She displays no atrophy. No  cranial nerve deficit or sensory deficit. She exhibits normal muscle tone. Coordination and gait normal.  Skin: Skin is warm and dry. No rash noted. No erythema.  Psychiatric: She has a normal mood and affect.          Assessment & Plan:   Problem List Items Addressed This Visit      Other   Neck pain on left side - Primary    For 1-2 mo w/o improvement and dec rom  Given px for flexeril to try QHS Recommend gentle heat and stretching  Better pillow/cervical support/memory foam  xr of CS today  Some rad  to upper arm but no neuro findings       Relevant Orders   DG Cervical Spine Complete (Completed)

## 2015-08-04 NOTE — Assessment & Plan Note (Signed)
For 1-2 mo w/o improvement and dec rom  Given px for flexeril to try QHS Recommend gentle heat and stretching  Better pillow/cervical support/memory foam  xr of CS today  Some rad to upper arm but no neuro findings

## 2015-08-04 NOTE — Progress Notes (Signed)
Pre visit review using our clinic review tool, if applicable. No additional management support is needed unless otherwise documented below in the visit note. 

## 2015-08-04 NOTE — Patient Instructions (Signed)
Try the flexeril (muscle relaxer) at night to help relax neck  Aleve with food as needed  Use a warm compress on neck also Make sure you pillow is not too tall or short - a memory foam pillow is helpful  Xray now  We will make a plan when result returns

## 2015-08-05 ENCOUNTER — Telehealth: Payer: Self-pay | Admitting: Family Medicine

## 2015-08-05 DIAGNOSIS — M542 Cervicalgia: Secondary | ICD-10-CM

## 2015-08-05 DIAGNOSIS — M47812 Spondylosis without myelopathy or radiculopathy, cervical region: Secondary | ICD-10-CM | POA: Insufficient documentation

## 2015-08-05 NOTE — Telephone Encounter (Signed)
-----   Message from Tammi Sou, [place] sent at 08/05/2015 12:59 PM EDT ----- Pt notified of xray results and Dr. Marliss Coots comments. Pt wants to see an Ortho doc in [place], please put referral in and I advise pt that our [facility] will call her to schedule an appt

## 2015-08-05 NOTE — Telephone Encounter (Signed)
Ref done Will route to [place] 

## 2015-08-22 IMAGING — CR DG MANDIBLE 4+V
4 series · 4 of 4 positions shown · non-contrast
Comparison: None.

CLINICAL DATA: Status post fall striking the right mandible on
bathtub 5 days ago. Pain and swelling persist over the right aspect
of the jaw all.

EXAM:
MANDIBLE - 4+ VIEW

[mandible pa]
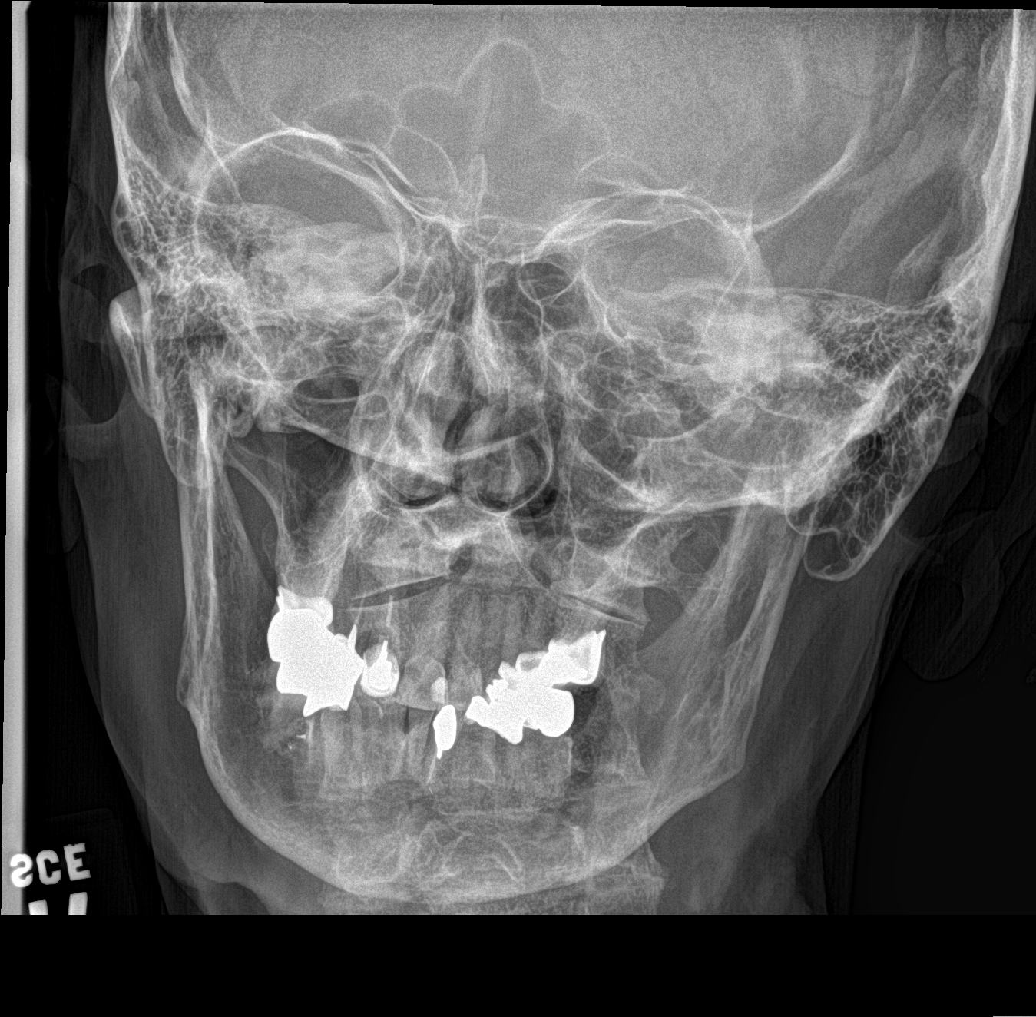

[mandible townes]
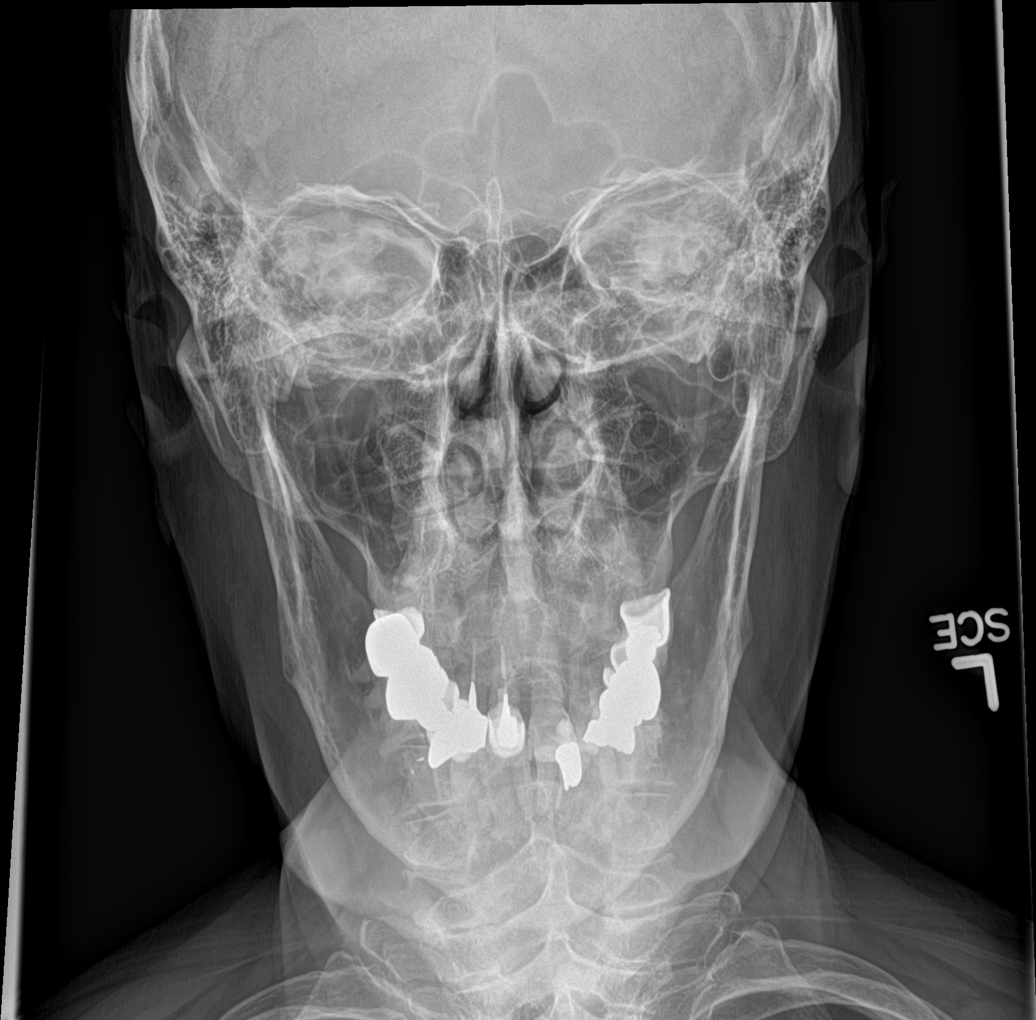

[mandible obl (1 of 2)]
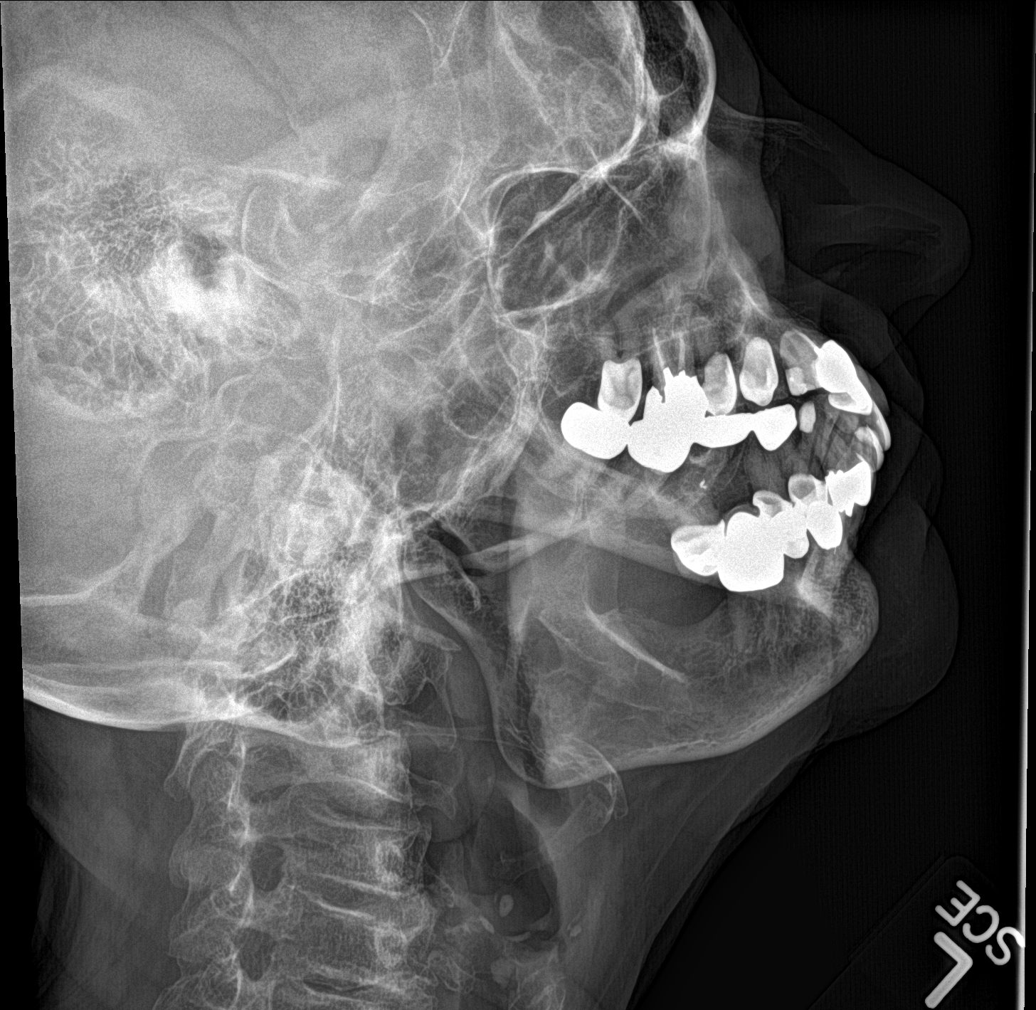

[mandible obl (2 of 2)]
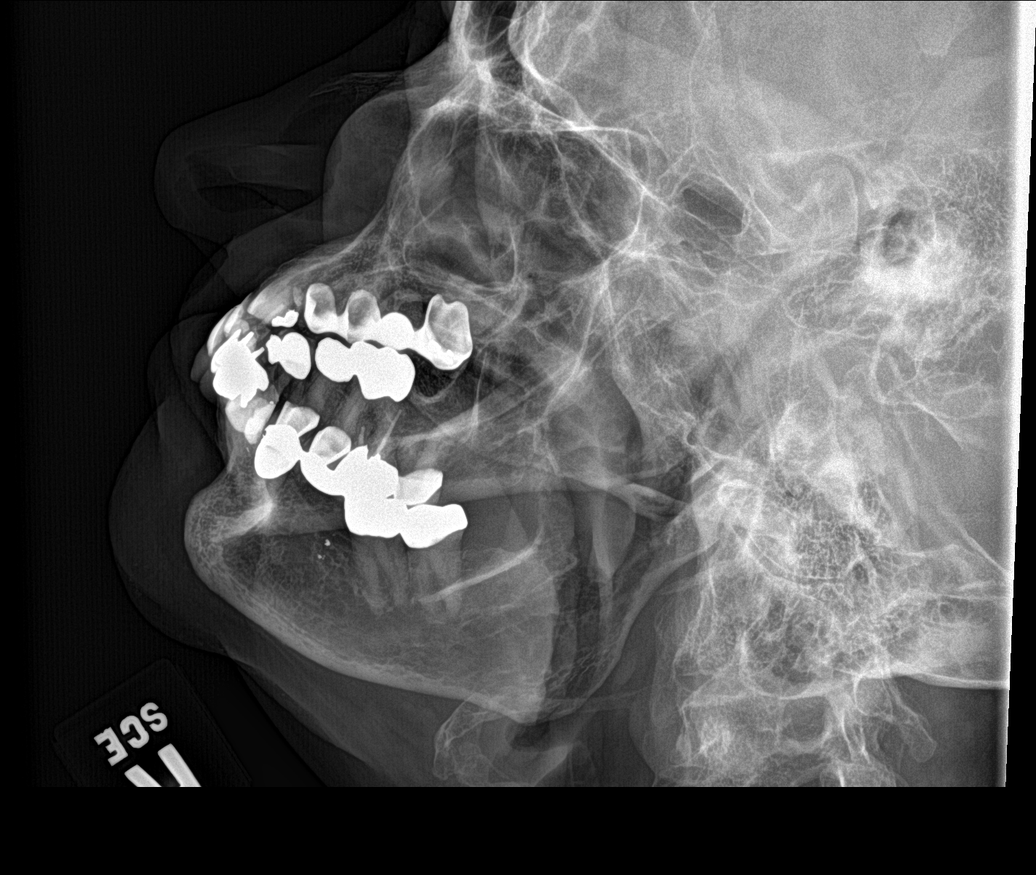

[4 of 4 positions shown; findings below may reference images not displayed]

FINDINGS: The mandible is mildly osteopenic. No acute fracture is observed.
There are mild degenerative changes of the temporomandibular joints.
IMPRESSION: No definite mandibular fracture is observed. If the patient's
symptoms are worsening or there is evidence of TMJ dysfunction, CT
scanning of the facial bones to include the mandible would be
recommended.

## 2015-08-24 ENCOUNTER — Telehealth: Payer: Self-pay | Admitting: Family Medicine

## 2015-08-24 DIAGNOSIS — Z0000 Encounter for general adult medical examination without abnormal findings: Secondary | ICD-10-CM

## 2015-08-24 NOTE — Telephone Encounter (Signed)
-----   Message from Ellamae Sia sent at 08/21/2015  5:16 PM EDT ----- Regarding: Lab orders for Friday, 8.4.17 Patient is scheduled for CPX labs, please order future labs, Thanks , Karna Christmas

## 2015-08-29 ENCOUNTER — Other Ambulatory Visit (INDEPENDENT_AMBULATORY_CARE_PROVIDER_SITE_OTHER): Payer: Medicare Other

## 2015-08-29 ENCOUNTER — Ambulatory Visit (INDEPENDENT_AMBULATORY_CARE_PROVIDER_SITE_OTHER): Payer: Medicare Other

## 2015-08-29 VITALS — BP 124/70 | HR 76 | Temp 97.5°F | Ht 64.0 in | Wt 151.5 lb

## 2015-08-29 DIAGNOSIS — Z0000 Encounter for general adult medical examination without abnormal findings: Secondary | ICD-10-CM | POA: Diagnosis not present

## 2015-08-29 LAB — COMPREHENSIVE METABOLIC PANEL
ALK PHOS: 49 U/L (ref 39–117)
ALT: 15 U/L (ref 0–35)
AST: 16 U/L (ref 0–37)
Albumin: 4.3 g/dL (ref 3.5–5.2)
BUN: 14 mg/dL (ref 6–23)
CHLORIDE: 103 meq/L (ref 96–112)
CO2: 32 mEq/L (ref 19–32)
Calcium: 10 mg/dL (ref 8.4–10.5)
Creatinine, Ser: 0.75 mg/dL (ref 0.40–1.20)
GFR: 79.09 mL/min (ref >60.00)
GLUCOSE: 96 mg/dL (ref 70–99)
POTASSIUM: 4.3 meq/L (ref 3.5–5.1)
Sodium: 140 mEq/L (ref 135–145)
TOTAL PROTEIN: 7 g/dL (ref 6.0–8.3)
Total Bilirubin: 0.5 mg/dL (ref 0.2–1.2)

## 2015-08-29 LAB — CBC WITH DIFFERENTIAL/PLATELET
BASOS PCT: 0.7 % (ref 0.0–3.0)
Basophils Absolute: 0 10*3/uL (ref 0.0–0.1)
EOS PCT: 3.4 % (ref 0.0–5.0)
Eosinophils Absolute: 0.2 10*3/uL (ref 0.0–0.7)
HCT: 38.8 % (ref 36.0–46.0)
HEMOGLOBIN: 12.7 g/dL (ref 12.0–15.0)
LYMPHS ABS: 2 10*3/uL (ref 0.7–4.0)
Lymphocytes Relative: 37.3 % (ref 12.0–46.0)
MCHC: 32.8 g/dL (ref 30.0–36.0)
MCV: 83.4 fl (ref 78.0–100.0)
MONOS PCT: 7.3 % (ref 3.0–12.0)
Monocytes Absolute: 0.4 10*3/uL (ref 0.1–1.0)
NEUTROS PCT: 51.3 % (ref 43.0–77.0)
Neutro Abs: 2.8 10*3/uL (ref 1.4–7.7)
Platelets: 235 10*3/uL (ref 150.0–400.0)
RBC: 4.65 Mil/uL (ref 3.87–5.11)
RDW: 14 % (ref 11.5–15.5)
WBC: 5.5 10*3/uL (ref 4.0–10.5)

## 2015-08-29 LAB — LIPID PANEL
Cholesterol: 212 mg/dL — ABNORMAL HIGH (ref 0–200)
HDL: 56.9 mg/dL (ref >39.00)
LDL CALC: 133 mg/dL — AB (ref 0–99)
NONHDL: 155.17
Total CHOL/HDL Ratio: 4
Triglycerides: 110 mg/dL (ref 0.0–149.0)
VLDL: 22 mg/dL (ref 0.0–40.0)

## 2015-08-29 LAB — TSH: TSH: 1.84 u[IU]/mL (ref 0.35–4.50)

## 2015-08-29 NOTE — Progress Notes (Signed)
PCP notes:   Health maintenance:  Tetanus - postponed Mammogram - postponed/pt plans to schedule future appt  Abnormal screenings:   Mini-Cog score: 19/20 Fall risk: hx of multiple falls with injury  Patient concerns:   None  Nurse concerns:  None  Next PCP appt:   09/05/15 @ 1215

## 2015-08-29 NOTE — Patient Instructions (Signed)
Ms. Mcclure , Thank you for taking time to come for your Medicare Wellness Visit. I appreciate your ongoing commitment to your health goals. Please review the following plan we discussed and let me know if I can assist you in the future.   These are the goals we discussed: Goals    . Increase physical activity          Starting 08/29/2015, I will continue to walk at least 30 min daily.        This is a list of the screening recommended for you and due dates:  Health Maintenance  Topic Date Due  . Flu Shot  01/25/2016*  . Mammogram  01/25/2016*  . Tetanus Vaccine  08/28/2016*  . DEXA scan (bone density measurement)  Completed  . Shingles Vaccine  Completed  . Pneumonia vaccines  Completed  *Topic was postponed. The date shown is not the original due date.   Preventive Care for Adults  A healthy lifestyle and preventive care can promote health and wellness. Preventive health guidelines for adults include the following key practices.  . A routine yearly physical is a good way to check with your health care provider about your health and preventive screening. It is a chance to share any concerns and updates on your health and to receive a thorough exam.  . Visit your dentist for a routine exam and preventive care every 6 months. Brush your teeth twice a day and floss once a day. Good oral hygiene prevents tooth decay and gum disease.  . The frequency of eye exams is based on your age, health, family medical history, use  of contact lenses, and other factors. Follow your health care provider's ecommendations for frequency of eye exams.  . Eat a healthy diet. Foods like vegetables, fruits, whole grains, low-fat dairy products, and lean protein foods contain the nutrients you need without too many calories. Decrease your intake of foods high in solid fats, added sugars, and salt. Eat the right amount of calories for you. Get information about a proper diet from your health care provider, if  necessary.  . Regular physical exercise is one of the most important things you can do for your health. Most adults should get at least 150 minutes of moderate-intensity exercise (any activity that increases your heart rate and causes you to sweat) each week. In addition, most adults need muscle-strengthening exercises on 2 or more days a week.  Silver Sneakers may be a benefit available to you. To determine eligibility, you may visit the website: www.silversneakers.com or contact program at 480-616-2233 Mon-Fri between 8AM-8PM.   . Maintain a healthy weight. The body mass index (BMI) is a screening tool to identify possible weight problems. It provides an estimate of body fat based on height and weight. Your health care provider can find your BMI and can help you achieve or maintain a healthy weight.   For adults 20 years and older: ? A BMI below 18.5 is considered underweight. ? A BMI of 18.5 to 24.9 is normal. ? A BMI of 25 to 29.9 is considered overweight. ? A BMI of 30 and above is considered obese.   . Maintain normal blood lipids and cholesterol levels by exercising and minimizing your intake of saturated fat. Eat a balanced diet with plenty of fruit and vegetables. Blood tests for lipids and cholesterol should begin at age 54 and be repeated every 5 years. If your lipid or cholesterol levels are high, you are over 50, or  you are at high risk for heart disease, you may need your cholesterol levels checked more frequently. Ongoing high lipid and cholesterol levels should be treated with medicines if diet and exercise are not working.  . If you smoke, find out from your health care provider how to quit. If you do not use tobacco, please do not start.  . If you choose to drink alcohol, please do not consume more than 2 drinks per day. One drink is considered to be 12 ounces (355 mL) of beer, 5 ounces (148 mL) of wine, or 1.5 ounces (44 mL) of liquor.  . If you are 75-19 years old, ask your  health care provider if you should take aspirin to prevent strokes.  . Use sunscreen. Apply sunscreen liberally and repeatedly throughout the day. You should seek shade when your shadow is shorter than you. Protect yourself by wearing long sleeves, pants, a wide-brimmed hat, and sunglasses year round, whenever you are outdoors.  . Once a month, do a whole body skin exam, using a mirror to look at the skin on your back. Tell your health care provider of new moles, moles that have irregular borders, moles that are larger than a pencil eraser, or moles that have changed in shape or color.

## 2015-08-29 NOTE — Progress Notes (Signed)
Subjective:   Sierra Bentley is a 82 y.o. female who presents for Medicare Annual (Subsequent) preventive examination.  Review of Systems:  N/A Cardiac Risk Factors include: advanced age (>92men, >34 women);dyslipidemia;hypertension     Objective:     Vitals: BP 124/70 (BP Location: Left Arm, Patient Position: Sitting, Cuff Size: Normal)   Pulse 76   Temp 97.5 F (36.4 C) (Oral)   Ht 5\' 4"  (1.626 m) Comment: no shoes  Wt 151 lb 8 oz (68.7 kg)   SpO2 96%   BMI 26.00 kg/m   Body mass index is 26 kg/m.   Tobacco History  Smoking Status  . Never Smoker  Smokeless Tobacco  . Never Used     Counseling given: No   Past Medical History:  Diagnosis Date  . Allergic rhinitis, cause unspecified   . Cramp of limb   . Diverticulosis   . Epistaxis   . GERD (gastroesophageal reflux disease)   . HTN (hypertension)   . Osteopenia   . Other and unspecified hyperlipidemia   . Other malaise and fatigue   . Rheumatoid arthritis(714.0)   . Urine incontinence    Past Surgical History:  Procedure Laterality Date  . BREAST BIOPSY  2/11   fibrocystic change  . BREAST BIOPSY  8/11   fibrocystic change  . COLONOSCOPY  2003  . CYSTOSCOPY  2006  . MOLE REMOVAL     rectal  . TONSILLECTOMY     Family History  Problem Relation Age of Onset  . Heart attack Father 65  . Coronary artery disease Father   . Other Mother     Arrythmia  . Coronary artery disease Mother   . Hypertension Mother   . Anxiety disorder Mother   . Breast cancer      2nd cousin  . Depression      family history   History  Sexual Activity  . Sexual activity: No    Outpatient Encounter Prescriptions as of 08/29/2015  Medication Sig  . estradiol (ESTRACE) 0.1 MG/GM vaginal cream Place 2 g vaginally daily as needed.    . fish oil-omega-3 fatty acids 1000 MG capsule Take 1 g by mouth daily.  Marland Kitchen losartan-hydrochlorothiazide (HYZAAR) 50-12.5 MG per tablet TAKE 0.5 TABLET (1/2TABLET) BY MOUTH DAILY.  .  mometasone (NASONEX) 50 MCG/ACT nasal spray Place 1 spray into the nose daily as needed.  . Multiple Vitamin (MULTIVITAMIN) tablet Take 1 tablet by mouth daily.    . pantoprazole (PROTONIX) 40 MG tablet Take 40 mg by mouth daily as needed.   Vladimir Faster Glycol-Propyl Glycol (SYSTANE ULTRA OP) Place 1 drop into both eyes 4 (four) times daily.    . sodium chloride (MURO 128) 5 % ophthalmic ointment Place 1 drop into both eyes as needed.    . cyclobenzaprine (FLEXERIL) 10 MG tablet Take 1 tablet (10 mg total) by mouth at bedtime as needed for muscle spasms (in neck). (Patient not taking: Reported on 08/29/2015)  . RESTASIS 0.05 % ophthalmic emulsion   . [DISCONTINUED] azithromycin (ZITHROMAX) 250 MG tablet Take 2 tabs today, then 1 tab daily x 4 days  . [DISCONTINUED] benzonatate (TESSALON) 200 MG capsule Take 1 capsule (200 mg total) by mouth 2 (two) times daily as needed for cough.  . [DISCONTINUED] fexofenadine (ALLEGRA) 180 MG tablet Take 180 mg by mouth daily as needed.     No facility-administered encounter medications on file as of 08/29/2015.     Activities of Daily Living In your  present state of health, do you have any difficulty performing the following activities: 08/29/2015  Hearing? Y  Vision? Y  Difficulty concentrating or making decisions? Y  Walking or climbing stairs? N  Dressing or bathing? N  Doing errands, shopping? N  Preparing Food and eating ? N  Using the Toilet? N  In the past six months, have you accidently leaked urine? Y  Do you have problems with loss of bowel control? N  Managing your Medications? N  Managing your Finances? N  Housekeeping or managing your Housekeeping? N  Some recent data might be hidden    Patient Care Team: Abner Greenspan, MD as PCP - General Beverly Gust, MD as Referring Physician (Otolaryngology) Shirlee More, MD as Referring Physician (Ophthalmology)    Assessment:    Hearing Screening Comments: Pt has been prescribed  bilateral hearing aids but chooses not to wear them Vision Screening Comments: Last vision exam in 2017 with Dr. Fletcher Anon Exercise Activities and Dietary recommendations Current Exercise Habits: Home exercise routine, Type of exercise: walking, Time (Minutes): 30, Frequency (Times/Week): 7, Weekly Exercise (Minutes/Week): 210, Intensity: Mild, Exercise limited by: None identified  Goals    . Increase physical activity          Starting 08/29/2015, I will continue to walk at least 30 min daily.       Fall Risk Fall Risk  08/29/2015 08/27/2014 07/25/2013 03/03/2012  Falls in the past year? Yes Yes Yes No  Number falls in past yr: 2 or more - 1 -  Injury with Fall? Yes Yes Yes -  Risk Factor Category  High Fall Risk - - -  Risk for fall due to : History of fall(s) Other (Comment) - -  Follow up Falls evaluation completed;Falls prevention discussed - - -   Depression Screen PHQ 2/9 Scores 08/29/2015 08/27/2014 07/25/2013 03/03/2012  PHQ - 2 Score 0 0 0 0     Cognitive Testing MMSE - Mini Mental State Exam 08/29/2015  Orientation to time 5  Orientation to Place 5  Registration 3  Attention/ Calculation 0  Recall 2  Recall-comments pt was unable to recall 1 of 3 words  Language- name 2 objects 0  Language- repeat 1  Language- follow 3 step command 3  Language- read & follow direction 0  Write a sentence 0  Copy design 0  Total score 19   PLEASE NOTE: A Mini-Cog screen was completed. Maximum score is 20. A value of 0 denotes this part of Folstein MMSE was not completed or the patient failed this part of the Mini-Cog screening.   Mini-Cog Screening Orientation to Time - Max 5 pts Orientation to Place - Max 5 pts Registration - Max 3 pts Recall - Max 3 pts Language Repeat - Max 1 pts Language Follow 3 Step Command - Max 3 pts  Immunization History  Administered Date(s) Administered  . Influenza Split 11/30/2010, 12/16/2011  . Influenza Whole 10/26/2007  . Influenza,inj,Quad PF,36+ Mos  11/09/2012, 11/16/2013, 01/03/2015  . Pneumococcal Conjugate-13 07/25/2013  . Pneumococcal Polysaccharide-23 11/30/2010  . Td 01/25/2005  . Zoster 03/29/2008   Screening Tests Health Maintenance  Topic Date Due  . INFLUENZA VACCINE  01/25/2016 (Originally 08/26/2015)  . MAMMOGRAM  01/25/2016 (Originally 03/09/2015)  . TETANUS/TDAP  08/28/2016 (Originally 01/26/2015)  . DEXA SCAN  Completed  . ZOSTAVAX  Completed  . PNA vac Low Risk Adult  Completed      Plan:     I have personally reviewed  and addressed the Medicare Annual Wellness questionnaire and have noted the following in the patient's chart:  A. Medical and social history B. Use of alcohol, tobacco or illicit drugs  C. Current medications and supplements D. Functional ability and status E.  Nutritional status F.  Physical activity G. Advance directives H. List of other physicians I.  Hospitalizations, surgeries, and ER visits in previous 12 months J.  [place] to include hearing, vision, cognitive, depression L. Referrals and appointments - none  In addition, I have reviewed and discussed with patient certain preventive protocols, quality metrics, and best practice recommendations. A written personalized care plan for preventive services as well as general preventive health recommendations were provided to patient.  See attached scanned questionnaire for additional information.   Signed,   Lindell Noe, MHA, BS, LPN Health Advisor

## 2015-08-29 NOTE — Progress Notes (Signed)
Pre visit review using our clinic review tool, if applicable. No additional management support is needed unless otherwise documented below in the visit note. 

## 2015-08-31 NOTE — Progress Notes (Signed)
I reviewed health advisor's note, was available for consultation, and agree with documentation and plan.  

## 2015-09-05 ENCOUNTER — Encounter: Payer: Self-pay | Admitting: Family Medicine

## 2015-09-05 ENCOUNTER — Ambulatory Visit (INDEPENDENT_AMBULATORY_CARE_PROVIDER_SITE_OTHER): Payer: Medicare Other | Admitting: Family Medicine

## 2015-09-05 VITALS — BP 118/62 | HR 80 | Temp 98.5°F | Ht 64.0 in | Wt 148.5 lb

## 2015-09-05 DIAGNOSIS — Z9181 History of falling: Secondary | ICD-10-CM

## 2015-09-05 DIAGNOSIS — I10 Essential (primary) hypertension: Secondary | ICD-10-CM

## 2015-09-05 DIAGNOSIS — E785 Hyperlipidemia, unspecified: Secondary | ICD-10-CM

## 2015-09-05 DIAGNOSIS — Z0000 Encounter for general adult medical examination without abnormal findings: Secondary | ICD-10-CM | POA: Diagnosis not present

## 2015-09-05 DIAGNOSIS — Z1211 Encounter for screening for malignant neoplasm of colon: Secondary | ICD-10-CM

## 2015-09-05 DIAGNOSIS — R296 Repeated falls: Secondary | ICD-10-CM

## 2015-09-05 DIAGNOSIS — K219 Gastro-esophageal reflux disease without esophagitis: Secondary | ICD-10-CM

## 2015-09-05 MED ORDER — LOSARTAN POTASSIUM-HCTZ 50-12.5 MG PO TABS: ORAL_TABLET | ORAL | 3 refills | Status: DC

## 2015-09-05 NOTE — Progress Notes (Signed)
Pre visit review using our clinic review tool, if applicable. No additional management support is needed unless otherwise documented below in the visit note. 

## 2015-09-05 NOTE — Assessment & Plan Note (Signed)
Pt has hx of inner ear problems and shuffling feet as well as adv age Disc prev falls Disc ways to prevent them  She denies need for asst device at this time Will avoid holding things while walking and turning head quickly Will look ahead of her Handout given on changes to make in the home

## 2015-09-05 NOTE — Assessment & Plan Note (Signed)
bp in fair control at this time  BP Readings from Last 1 Encounters:  09/05/15 118/62   No changes needed Disc lifstyle change with low sodium diet and exercise  Labs reviewed

## 2015-09-05 NOTE — Assessment & Plan Note (Signed)
On protonix from GI- but only takes it once in a while Disc diet choices for GERD

## 2015-09-05 NOTE — Progress Notes (Signed)
Subjective:    Patient ID: Sierra Bentley, female    DOB: 02-19-1933, 82 y.o.   MRN: [id number]  HPI  Here for health maintenance exam and to review chronic medical problems    Had a good summer/ took a cruise for her anniversary    She had AMW with Katha Cabal earlier this month  Fails hearing exam- she refuses to use her hearing aides (her husband has TV so loud that she cannot wear them) No other concerns   Wt Readings from Last 3 Encounters:  09/05/15 148 lb 8 oz (67.4 kg)  08/29/15 151 lb 8 oz (68.7 kg)  08/04/15 153 lb 8 oz (69.6 kg)  down 3 lb - has cut back on sweets (ice cream) and also smaller portions and cut bad fats  bmi is 25.3  Gets flu shots every fall  Mammogram 8/16-neg - knows she is due and has an appt at [facility] already  Self breast exam -has not noticed new lumps  Her gyn checks also-nothing new Has fibrocystic breasts with hx of bx   Sees gyn for pelvic and breast exam   Tetanus vaccine 1/07-will see if she can get at pharmacy   dexa 2/16- osteopenia, spine improved slightly Has had 2 falls -- slipped on a wet floor in a hospital after it rained / other time out walking and stumbled on something  Now she looks ahead of her when she walks - focus  Fracture history-none  Ca and D- takes it regularly   8/16 ifob -neg colonosc nl 2013-no f/u Declines further screening like cologuard  She sees dr Cristina Gong once yearly for a check up   He put her on protonix for GERD  Only takes when she really needs it     bp is stable today  No cp or palpitations or headaches or edema  No side effects to medicines  BP Readings from Last 3 Encounters:  09/05/15 118/62  08/29/15 124/70  08/04/15 128/64       Chemistry      Component Value Date/Time   NA 140 08/29/2015 0825   K 4.3 08/29/2015 0825   CL 103 08/29/2015 0825   CO2 32 08/29/2015 0825   BUN 14 08/29/2015 0825   CREATININE 0.75 08/29/2015 0825      Component Value Date/Time   CALCIUM 10.0  08/29/2015 0825   ALKPHOS 49 08/29/2015 0825   AST 16 08/29/2015 0825   ALT 15 08/29/2015 0825   BILITOT 0.5 08/29/2015 0825     glucose is 96  Lab Results  Component Value Date   WBC 5.5 08/29/2015   HGB 12.7 08/29/2015   HCT 38.8 08/29/2015   MCV 83.4 08/29/2015   PLT 235.0 08/29/2015   Lab Results  Component Value Date   TSH 1.84 08/29/2015     Hx of hyperlipidmeia Lab Results  Component Value Date   CHOL 212 (H) 08/29/2015   CHOL 201 (H) 08/23/2014   CHOL 222 (H) 07/18/2013   Lab Results  Component Value Date   HDL 56.90 08/29/2015   HDL 45.40 08/23/2014   HDL 46.60 07/18/2013   Lab Results  Component Value Date   LDLCALC 133 (H) 08/29/2015   LDLCALC 117 (H) 08/23/2014   LDLCALC 154 (H) 07/18/2013   Lab Results  Component Value Date   TRIG 110.0 08/29/2015   TRIG 191.0 (H) 08/23/2014   TRIG 109.0 07/18/2013   Lab Results  Component Value Date   CHOLHDL 4 08/29/2015  CHOLHDL 4 08/23/2014   CHOLHDL 5 07/18/2013   Lab Results  Component Value Date   LDLDIRECT 130.4 02/25/2012   LDLDIRECT 134.1 08/18/2011   LDLDIRECT 155.9 11/30/2010   takes fish oil Both LDL and HDL went up slightly  She walks every day now [employer]   Patient Active Problem List   Diagnosis Date Noted  . At moderate risk for fall 09/05/2015  . Cervical spondylosis without myelopathy 08/05/2015  . Neck pain on left side 08/04/2015  . Routine general medical examination at a health care facility 08/27/2014  . Colon cancer screening 08/27/2014  . Fall against object 08/15/2014  . Thoracic back pain 08/14/2014  . Vaccine reaction 08/03/2013  . Encounter for Medicare annual wellness exam 07/25/2013  . Varicosities of leg 05/23/2012  . Skin lesion of right lower limb 05/23/2012  . Abnormal ultrasound of thyroid gland 03/01/2011  . Hyperlipidemia 06/11/2008  . ALLERGIC RHINITIS 03/29/2008  . Essential hypertension, benign 12/26/2006  . GERD 12/26/2006  . RHEUMATOID  ARTHRITIS 12/26/2006  . Osteopenia 12/26/2006  . URINARY INCONTINENCE 12/26/2006   Past Medical History:  Diagnosis Date  . Allergic rhinitis, cause unspecified   . Cramp of limb   . Diverticulosis   . Epistaxis   . GERD (gastroesophageal reflux disease)   . HTN (hypertension)   . Osteopenia   . Other and unspecified hyperlipidemia   . Other malaise and fatigue   . Rheumatoid arthritis(714.0)   . Urine incontinence    Past Surgical History:  Procedure Laterality Date  . BREAST BIOPSY  2/11   fibrocystic change  . BREAST BIOPSY  8/11   fibrocystic change  . COLONOSCOPY  2003  . CYSTOSCOPY  2006  . MOLE REMOVAL     rectal  . TONSILLECTOMY     Social History  Substance Use Topics  . Smoking status: Never Smoker  . Smokeless tobacco: Never Used  . Alcohol use No   Family History  Problem Relation Age of Onset  . Heart attack Father 62  . Coronary artery disease Father   . Other Mother     Arrythmia  . Coronary artery disease Mother   . Hypertension Mother   . Anxiety disorder Mother   . Breast cancer      2nd cousin  . Depression      family history   Allergies  Allergen Reactions  . Ace Inhibitors     REACTION: cough  . Alendronate Sodium     REACTION: reflux, trouble swallowing  . Chocolate   . Dust Mite Extract   . Ibandronate Sodium     REACTION: reflux and increased BP   Current Outpatient Prescriptions on File Prior to Visit  Medication Sig Dispense Refill  . cyclobenzaprine (FLEXERIL) 10 MG tablet Take 1 tablet (10 mg total) by mouth at bedtime as needed for muscle spasms (in neck). 30 tablet 1  . estradiol (ESTRACE) 0.1 MG/GM vaginal cream Place 2 g vaginally daily as needed.      . fish oil-omega-3 fatty acids 1000 MG capsule Take 1 g by mouth daily.    . mometasone (NASONEX) 50 MCG/ACT nasal spray Place 1 spray into the nose daily as needed. 17 g 11  . Multiple Vitamin (MULTIVITAMIN) tablet Take 1 tablet by mouth daily.      . pantoprazole  (PROTONIX) 40 MG tablet Take 40 mg by mouth daily as needed.   11  . Polyethyl Glycol-Propyl Glycol (SYSTANE ULTRA OP) Place 1 drop into  both eyes 4 (four) times daily.      . RESTASIS 0.05 % ophthalmic emulsion   2  . sodium chloride (MURO 128) 5 % ophthalmic ointment Place 1 drop into both eyes at bedtime.      No current facility-administered medications on file prior to visit.     Review of Systems Review of Systems  Constitutional: Negative for fever, appetite change, fatigue and unexpected weight change.  Eyes: Negative for pain and visual disturbance.  Respiratory: Negative for cough and shortness of breath.   Cardiovascular: Negative for cp or palpitations    Gastrointestinal: Negative for nausea, diarrhea and constipation.  Genitourinary: Negative for urgency and frequency.  Skin: Negative for pallor or rash   Neurological: Negative for weakness, light-headedness, numbness and headaches.  Hematological: Negative for adenopathy. Does not bruise/bleed easily.  Psychiatric/Behavioral: Negative for dysphoric mood. The patient is not nervous/anxious.         Objective:   Physical Exam  Constitutional: She appears well-developed and well-nourished. No distress.  Well appearing   HENT:  Head: Normocephalic and atraumatic.  Right Ear: External ear normal.  Left Ear: External ear normal.  Nose: Nose normal.  Mouth/Throat: Oropharynx is clear and moist.  Eyes: Conjunctivae and EOM are normal. Pupils are equal, round, and reactive to light. Right eye exhibits no discharge. Left eye exhibits no discharge. No scleral icterus.  Neck: Normal range of motion. Neck supple. No JVD present. Carotid bruit is not present. No thyromegaly present.  Cardiovascular: Normal rate, regular rhythm, normal heart sounds and intact distal pulses.  Exam reveals no gallop.   Pulmonary/Chest: Effort normal and breath sounds normal. No respiratory distress. She has no wheezes. She has no rales.  Abdominal:  Soft. Bowel sounds are normal. She exhibits no distension and no mass. There is no tenderness.  Genitourinary:  Genitourinary Comments: Def for gyn provider  Musculoskeletal: She exhibits no edema or tenderness.  No kyphosis  Lymphadenopathy:    She has no cervical adenopathy.  Neurological: She is alert. She has normal reflexes. No cranial nerve deficit. She exhibits normal muscle tone. Coordination normal.  Skin: Skin is warm and dry. No rash noted. No erythema. No pallor.  Fair complexion with some skin tags on trunk   Psychiatric: She has a normal mood and affect.          Assessment & Plan:   Problem List Items Addressed This Visit      Cardiovascular and Mediastinum   Essential hypertension, benign - Primary    bp in fair control at this time  BP Readings from Last 1 Encounters:  09/05/15 118/62   No changes needed Disc lifstyle change with low sodium diet and exercise  Labs reviewed       Relevant Medications   losartan-hydrochlorothiazide (HYZAAR) 50-12.5 MG tablet     Digestive   GERD    On protonix from GI- but only takes it once in a while Disc diet choices for GERD        Other   Routine general medical examination at a health care facility    Reviewed health habits including diet and exercise and skin cancer prevention Reviewed appropriate screening tests for age  Also reviewed health mt list, fam hx and immunization status , as well as social and family history   See HPI Rev AMW Rev labs Get your flu shot in the fall  You will be due for a Tetanus shot in the winter - take a look  at the handout I gave you about getting it at a pharmacy  When you see Dr Deanna Artis him about the protonix and risks associated with it and if he wants you wean off of it at any time  For cholesterol   Avoid red meat/ fried foods/ egg yolks/ fatty breakfast meats/ butter, cheese and high fat dairy/ and shellfish  (healhy fats like nuts and olive oil and fish are ok)    Take care of yourself  Here is a handout to prevent falls       Hyperlipidemia    Disc goals for lipids and reasons to control them Rev labs with pt Rev low sat fat diet in detail  HDL and LDL are up slightly      Relevant Medications   losartan-hydrochlorothiazide (HYZAAR) 50-12.5 MG tablet   Colon cancer screening    Nl ifob last year Declines further screen such as cologuard       At moderate risk for fall    Pt has hx of inner ear problems and shuffling feet as well as adv age Disc prev falls Disc ways to prevent them  She denies need for asst device at this time Will avoid holding things while walking and turning head quickly Will look ahead of her Handout given on changes to make in the home       Other Visit Diagnoses   None.

## 2015-09-05 NOTE — Assessment & Plan Note (Signed)
Disc goals for lipids and reasons to control them Rev labs with pt Rev low sat fat diet in detail  HDL and LDL are up slightly

## 2015-09-05 NOTE — Patient Instructions (Addendum)
Get your flu shot in the fall  You will be due for a Tetanus shot in the winter - take a look at the handout I gave you about getting it at a pharmacy  When you see Dr Deanna Artis him about the protonix and risks associated with it and if he wants you wean off of it at any time  For cholesterol   Avoid red meat/ fried foods/ egg yolks/ fatty breakfast meats/ butter, cheese and high fat dairy/ and shellfish  (healhy fats like nuts and olive oil and fish are ok)   Take care of yourself  Here is a handout to prevent falls

## 2015-09-05 NOTE — Assessment & Plan Note (Signed)
Reviewed health habits including diet and exercise and skin cancer prevention Reviewed appropriate screening tests for age  Also reviewed health mt list, fam hx and immunization status , as well as social and family history   See HPI Rev AMW Rev labs Get your flu shot in the fall  You will be due for a Tetanus shot in the winter - take a look at the handout I gave you about getting it at a pharmacy  When you see Dr Deanna Artis him about the protonix and risks associated with it and if he wants you wean off of it at any time  For cholesterol   Avoid red meat/ fried foods/ egg yolks/ fatty breakfast meats/ butter, cheese and high fat dairy/ and shellfish  (healhy fats like nuts and olive oil and fish are ok)   Take care of yourself  Here is a handout to prevent falls

## 2015-09-05 NOTE — Assessment & Plan Note (Signed)
Nl ifob last year Declines further screen such as cologuard

## 2015-09-15 LAB — HM MAMMOGRAPHY

## 2015-09-22 ENCOUNTER — Encounter: Payer: Self-pay | Admitting: Family Medicine

## 2015-09-23 ENCOUNTER — Encounter: Payer: Self-pay | Admitting: *Deleted

## 2015-10-03 ENCOUNTER — Ambulatory Visit (INDEPENDENT_AMBULATORY_CARE_PROVIDER_SITE_OTHER): Payer: Medicare Other | Admitting: Family Medicine

## 2015-10-03 ENCOUNTER — Telehealth: Payer: Self-pay

## 2015-10-03 ENCOUNTER — Encounter: Payer: Self-pay | Admitting: Family Medicine

## 2015-10-03 VITALS — BP 128/74 | HR 74 | Temp 98.1°F | Ht 64.0 in | Wt 151.2 lb

## 2015-10-03 DIAGNOSIS — B351 Tinea unguium: Secondary | ICD-10-CM | POA: Diagnosis not present

## 2015-10-03 DIAGNOSIS — R233 Spontaneous ecchymoses: Secondary | ICD-10-CM

## 2015-10-03 LAB — CBC WITH DIFFERENTIAL/PLATELET
BASOS PCT: 0.9 % (ref 0.0–3.0)
Basophils Absolute: 0.1 10*3/uL (ref 0.0–0.1)
EOS PCT: 2.1 % (ref 0.0–5.0)
Eosinophils Absolute: 0.1 10*3/uL (ref 0.0–0.7)
HCT: 40.5 % (ref 36.0–46.0)
Hemoglobin: 13.5 g/dL (ref 12.0–15.0)
LYMPHS ABS: 1.8 10*3/uL (ref 0.7–4.0)
Lymphocytes Relative: 30.8 % (ref 12.0–46.0)
MCHC: 33.3 g/dL (ref 30.0–36.0)
MCV: 82.8 fl (ref 78.0–100.0)
MONOS PCT: 6.9 % (ref 3.0–12.0)
Monocytes Absolute: 0.4 10*3/uL (ref 0.1–1.0)
NEUTROS ABS: 3.5 10*3/uL (ref 1.4–7.7)
NEUTROS PCT: 59.3 % (ref 43.0–77.0)
PLATELETS: 252 10*3/uL (ref 150.0–400.0)
RBC: 4.89 Mil/uL (ref 3.87–5.11)
RDW: 14.2 % (ref 11.5–15.5)
WBC: 5.9 10*3/uL (ref 4.0–10.5)

## 2015-10-03 LAB — APTT: aPTT: 33.2 s — ABNORMAL HIGH (ref 23.4–32.7)

## 2015-10-03 LAB — PROTIME-INR
INR: 1 ratio (ref 0.8–1.0)
Prothrombin Time: 10.4 s (ref 9.6–13.1)

## 2015-10-03 NOTE — Progress Notes (Signed)
Subjective:    Patient ID: Sierra Bentley, female    DOB: [birth year], 82 y.o.   MRN: [id number]  HPI Here for concerns about spots on hands and skin issue on feet   She was concerned - her SIL had MRSA and she was exposed to her   Has noticed some bruised/red areas on back of hands and some spots on arms  Mostly look like bruises  No trauma and no blood draws   Has a pedicure in April- then podiatry dx with fungus on L great toenail  It improved with otc med (topical- from [facility])- ? Name of it Now it is splitting    Wt Readings from Last 3 Encounters:  10/03/15 151 lb 4 oz (68.6 kg)  09/05/15 148 lb 8 oz (67.4 kg)  08/29/15 151 lb 8 oz (68.7 kg)    Lab Results  Component Value Date   WBC 5.5 08/29/2015   HGB 12.7 08/29/2015   HCT 38.8 08/29/2015   MCV 83.4 08/29/2015   PLT 235.0 08/29/2015    No aspirin  Aleve occ - for her neck - perhaps once a week -last took 2 days ago   Patient Active Problem List   Diagnosis Date Noted  . Spontaneous bruising 10/03/2015  . Toenail fungus 10/03/2015  . At moderate risk for fall 09/05/2015  . Cervical spondylosis without myelopathy 08/05/2015  . Neck pain on left side 08/04/2015  . Routine general medical examination at a health care facility 08/27/2014  . Colon cancer screening 08/27/2014  . Fall against object 08/15/2014  . Thoracic back pain 08/14/2014  . Vaccine reaction 08/03/2013  . Encounter for Medicare annual wellness exam 07/25/2013  . Varicosities of leg 05/23/2012  . Skin lesion of right lower limb 05/23/2012  . Abnormal ultrasound of thyroid gland 03/01/2011  . Hyperlipidemia 06/11/2008  . ALLERGIC RHINITIS 03/29/2008  . Essential hypertension, benign 12/26/2006  . GERD 12/26/2006  . RHEUMATOID ARTHRITIS 12/26/2006  . Osteopenia 12/26/2006  . URINARY INCONTINENCE 12/26/2006   Past Medical History:  Diagnosis Date  . Allergic rhinitis, cause unspecified   . Cramp of limb   . Diverticulosis   .  Epistaxis   . GERD (gastroesophageal reflux disease)   . HTN (hypertension)   . Osteopenia   . Other and unspecified hyperlipidemia   . Other malaise and fatigue   . Rheumatoid arthritis(714.0)   . Urine incontinence    Past Surgical History:  Procedure Laterality Date  . BREAST BIOPSY  2/11   fibrocystic change  . BREAST BIOPSY  8/11   fibrocystic change  . COLONOSCOPY  2003  . CYSTOSCOPY  2006  . MOLE REMOVAL     rectal  . TONSILLECTOMY     Social History  Substance Use Topics  . Smoking status: Never Smoker  . Smokeless tobacco: Never Used  . Alcohol use No   Family History  Problem Relation Age of Onset  . Heart attack Father 32  . Coronary artery disease Father   . Other Mother     Arrythmia  . Coronary artery disease Mother   . Hypertension Mother   . Anxiety disorder Mother   . Breast cancer      2nd cousin  . Depression      family history   Allergies  Allergen Reactions  . Ace Inhibitors     REACTION: cough  . Alendronate Sodium     REACTION: reflux, trouble swallowing  . Chocolate   . Dust  Mite Extract   . Ibandronate Sodium     REACTION: reflux and increased BP   Current Outpatient Prescriptions on File Prior to Visit  Medication Sig Dispense Refill  . cyclobenzaprine (FLEXERIL) 10 MG tablet Take 1 tablet (10 mg total) by mouth at bedtime as needed for muscle spasms (in neck). 30 tablet 1  . estradiol (ESTRACE) 0.1 MG/GM vaginal cream Place 2 g vaginally daily as needed.      . fish oil-omega-3 fatty acids 1000 MG capsule Take 1 g by mouth daily.    Marland Kitchen losartan-hydrochlorothiazide (HYZAAR) 50-12.5 MG tablet TAKE 0.5 TABLET (1/2TABLET) BY MOUTH DAILY. 45 tablet 3  . mometasone (NASONEX) 50 MCG/ACT nasal spray Place 1 spray into the nose daily as needed. 17 g 11  . Multiple Vitamin (MULTIVITAMIN) tablet Take 1 tablet by mouth daily.      . pantoprazole (PROTONIX) 40 MG tablet Take 40 mg by mouth daily as needed.   11  . Polyethyl Glycol-Propyl  Glycol (SYSTANE ULTRA OP) Place 1 drop into both eyes 4 (four) times daily.      . RESTASIS 0.05 % ophthalmic emulsion   2  . sodium chloride (MURO 128) 5 % ophthalmic ointment Place 1 drop into both eyes at bedtime.      No current facility-administered medications on file prior to visit.      Review of Systems Review of Systems  Constitutional: Negative for fever, appetite change, fatigue and unexpected weight change.  Eyes: Negative for pain and visual disturbance.  Respiratory: Negative for cough and shortness of breath.   Cardiovascular: Negative for cp or palpitations    Gastrointestinal: Negative for nausea, diarrhea and constipation.  Genitourinary: Negative for urgency and frequency.  Skin: Negative for pallor or rash   Neurological: Negative for weakness, light-headedness, numbness and headaches.  Hematological: Negative for adenopathy. Does bruise easily, neg for easy bleeding however Psychiatric/Behavioral: Negative for dysphoric mood. The patient is not nervous/anxious.         Objective:   Physical Exam  Constitutional: She appears well-developed and well-nourished. No distress.  HENT:  Head: Normocephalic and atraumatic.  Mouth/Throat: Oropharynx is clear and moist.  Eyes: Conjunctivae and EOM are normal. Pupils are equal, round, and reactive to light. No scleral icterus.  Neck: Normal range of motion. Neck supple. No thyromegaly present.  Cardiovascular: Normal rate, regular rhythm, normal heart sounds and intact distal pulses.   Pulmonary/Chest: Effort normal and breath sounds normal. No respiratory distress. She has no wheezes. She has no rales.  Abdominal: Soft. Bowel sounds are normal. She exhibits no distension and no mass. There is no tenderness. There is no rebound and no guarding.  No HSM   Musculoskeletal: She exhibits no edema.  Lymphadenopathy:    She has no cervical adenopathy.  Neurological: She is alert.  Skin: Skin is warm and dry. No rash noted.  No erythema. No pallor.  L great toenail- thickened and discolored distally with rough surface (base is clear) No tenderness  Ecchymoses of different ages and shapes on UEs bilat, large one on dorsal L hand No skin breakdown  Psychiatric: She has a normal mood and affect.          Assessment & Plan:   Problem List Items Addressed This Visit      Musculoskeletal and Integument   Toenail fungus    Pt was using an otc tx that worked The base of the toenail (R great) -is clear- suspect it is working She will  get back to [place] with the name of the medication and continue it  Disc disinfecting surfaces to prevent athelete's foot and avoid pedicures        Other   Spontaneous bruising    New bruising on UEs-different sizes Disc trauma-has carried a heavier bag under her arm lately  Takes occ aleve No prev problems  Will check lab incl platelet /PT/PTT and update  Otherwise nl exam       Relevant Orders   CBC with Differential/Platelet   Protime-INR   APTT    Other Visit Diagnoses   None.

## 2015-10-03 NOTE — Telephone Encounter (Signed)
Pt has bruising or spots on left hand and rt index finger; pt saw someone who had MRSA and flesh eating bacteria before Anis knew the person had that condition. Also has fungus returning on foot. Pt has appt to see Dr Glori Bickers on 10/03/15 at 11:45. FYI to Dr Glori Bickers.

## 2015-10-03 NOTE — Assessment & Plan Note (Signed)
New bruising on UEs-different sizes Disc trauma-has carried a heavier bag under her arm lately  Takes occ aleve No prev problems  Will check lab incl platelet /PT/PTT and update  Otherwise nl exam

## 2015-10-03 NOTE — Patient Instructions (Signed)
Labs for bruising today  Aleve may make you bruise more easily  Be careful regarding how you hold your bag If the toenail fungus continues - continue the over the counter treatment that works for you (let us know the name of it in the future)

## 2015-10-03 NOTE — Assessment & Plan Note (Signed)
Pt was using an otc tx that worked The base of the toenail (R great) -is clear- suspect it is working She will get back to [place] with the name of the medication and continue it  Disc disinfecting surfaces to prevent athelete's foot and avoid pedicures

## 2015-10-03 NOTE — Progress Notes (Signed)
Pre visit review using our clinic review tool, if applicable. No additional management support is needed unless otherwise documented below in the visit note. 

## 2015-10-03 NOTE — Telephone Encounter (Signed)
I will see her then  

## 2015-10-13 ENCOUNTER — Encounter: Payer: Self-pay | Admitting: *Deleted

## 2015-11-06 ENCOUNTER — Ambulatory Visit (INDEPENDENT_AMBULATORY_CARE_PROVIDER_SITE_OTHER): Payer: Medicare Other

## 2015-11-06 DIAGNOSIS — Z23 Encounter for immunization: Secondary | ICD-10-CM | POA: Diagnosis not present

## 2016-05-14 ENCOUNTER — Ambulatory Visit: Payer: Medicare Other | Admitting: Primary Care

## 2016-05-17 ENCOUNTER — Ambulatory Visit (INDEPENDENT_AMBULATORY_CARE_PROVIDER_SITE_OTHER): Payer: Medicare Other | Admitting: Family Medicine

## 2016-05-17 ENCOUNTER — Encounter: Payer: Self-pay | Admitting: Family Medicine

## 2016-05-17 VITALS — BP 116/62 | HR 89 | Temp 97.5°F | Ht 64.0 in | Wt 154.2 lb

## 2016-05-17 DIAGNOSIS — I8390 Asymptomatic varicose veins of unspecified lower extremity: Secondary | ICD-10-CM

## 2016-05-17 DIAGNOSIS — Z23 Encounter for immunization: Secondary | ICD-10-CM | POA: Diagnosis not present

## 2016-05-17 DIAGNOSIS — L989 Disorder of the skin and subcutaneous tissue, unspecified: Secondary | ICD-10-CM | POA: Insufficient documentation

## 2016-05-17 NOTE — Patient Instructions (Addendum)
I think the skin irritation behind your knee is related to your varicose veins  An insect bite is also possible  I did not see a tick  Use a cool compress Keep clean with soap and water  Get some over the counter cortisone cream and use it twice daily for itching as needed   If you develop a rash or fever or the spot gets bigger please alert [place]   Tetanus shot today   If you want to go to a vein clinic in the future let us know   Wear support stockings whenever it is not too hot

## 2016-05-17 NOTE — Progress Notes (Signed)
Subjective:    Patient ID: Sierra Bentley, female    DOB: [birth year], 83 y.o.   MRN: [id number]  HPI Here for skin problem (bumps) behind left knee  Woke up with itching Thursday am  Took picture- looked like a blood blister  It has calmed down  Still a little itchy  She put rubbing alcohol on it  Never saw a tick No rash or fever   Her veins do bother her esp on L leg  Considered vein clinic Wear supp hose when not hot   Feeling fine    Also wants to update her tetanus shot today -even though it may not be covered here   Patient Active Problem List   Diagnosis Date Noted  . Skin lesion 05/17/2016  . Spontaneous bruising 10/03/2015  . Toenail fungus 10/03/2015  . At moderate risk for fall 09/05/2015  . Cervical spondylosis without myelopathy 08/05/2015  . Neck pain on left side 08/04/2015  . Routine general medical examination at a health care facility 08/27/2014  . Colon cancer screening 08/27/2014  . Fall against object 08/15/2014  . Thoracic back pain 08/14/2014  . Vaccine reaction 08/03/2013  . Encounter for Medicare annual wellness exam 07/25/2013  . Varicosities of leg 05/23/2012  . Skin lesion of right lower limb 05/23/2012  . Abnormal ultrasound of thyroid gland 03/01/2011  . Hyperlipidemia 06/11/2008  . ALLERGIC RHINITIS 03/29/2008  . Essential hypertension, benign 12/26/2006  . GERD 12/26/2006  . RHEUMATOID ARTHRITIS 12/26/2006  . Osteopenia 12/26/2006  . URINARY INCONTINENCE 12/26/2006   Past Medical History:  Diagnosis Date  . Allergic rhinitis, cause unspecified   . Cramp of limb   . Diverticulosis   . Epistaxis   . GERD (gastroesophageal reflux disease)   . HTN (hypertension)   . Osteopenia   . Other and unspecified hyperlipidemia   . Other malaise and fatigue   . Rheumatoid arthritis(714.0)   . Urine incontinence    Past Surgical History:  Procedure Laterality Date  . BREAST BIOPSY  2/11   fibrocystic change  . BREAST BIOPSY  8/11     fibrocystic change  . COLONOSCOPY  2003  . CYSTOSCOPY  2006  . MOLE REMOVAL     rectal  . TONSILLECTOMY     Social History  Substance Use Topics  . Smoking status: Never Smoker  . Smokeless tobacco: Never Used  . Alcohol use No   Family History  Problem Relation Age of Onset  . Heart attack Father 25  . Coronary artery disease Father   . Other Mother     Arrythmia  . Coronary artery disease Mother   . Hypertension Mother   . Anxiety disorder Mother   . Breast cancer      2nd cousin  . Depression      family history   Allergies  Allergen Reactions  . Ace Inhibitors     REACTION: cough  . Alendronate Sodium     REACTION: reflux, trouble swallowing  . Chocolate   . Dust Mite Extract   . Ibandronate Sodium     REACTION: reflux and increased BP   Current Outpatient Prescriptions on File Prior to Visit  Medication Sig Dispense Refill  . fish oil-omega-3 fatty acids 1000 MG capsule Take 1 g by mouth daily.    Marland Kitchen losartan-hydrochlorothiazide (HYZAAR) 50-12.5 MG tablet TAKE 0.5 TABLET (1/2TABLET) BY MOUTH DAILY. 45 tablet 3  . mometasone (NASONEX) 50 MCG/ACT nasal spray Place 1 spray into the nose  daily as needed. 17 g 11  . Multiple Vitamin (MULTIVITAMIN) tablet Take 1 tablet by mouth daily.      . pantoprazole (PROTONIX) 40 MG tablet Take 40 mg by mouth daily as needed.   11  . Polyethyl Glycol-Propyl Glycol (SYSTANE ULTRA OP) Place 1 drop into both eyes 4 (four) times daily.      . sodium chloride (MURO 128) 5 % ophthalmic ointment Place 1 drop into both eyes at bedtime.     . cyclobenzaprine (FLEXERIL) 10 MG tablet Take 1 tablet (10 mg total) by mouth at bedtime as needed for muscle spasms (in neck). (Patient not taking: Reported on 05/17/2016) 30 tablet 1   No current facility-administered medications on file prior to visit.     Review of Systems Review of Systems  Constitutional: Negative for fever, appetite change, fatigue and unexpected weight change.  Eyes:  Negative for pain and visual disturbance.  Respiratory: Negative for cough and shortness of breath.   Cardiovascular: Negative for cp or palpitations   pos for varicosities on legs  Gastrointestinal: Negative for nausea, diarrhea and constipation.  Genitourinary: Negative for urgency and frequency.  Skin: Negative for pallor or rash  pos for small area of itch/red/swelling behind L knee  Neurological: Negative for weakness, light-headedness, numbness and headaches.  Hematological: Negative for adenopathy. Does not bruise/bleed easily.  Psychiatric/Behavioral: Negative for dysphoric mood. The patient is not nervous/anxious.         Objective:   Physical Exam  Constitutional: She appears well-developed and well-nourished. No distress.  Well appearing  HENT:  Head: Normocephalic and atraumatic.  Eyes: Conjunctivae and EOM are normal. Pupils are equal, round, and reactive to light.  Neck: Normal range of motion. Neck supple. No JVD present. Carotid bruit is not present. No thyromegaly present.  Cardiovascular: Normal rate, regular rhythm, normal heart sounds and intact distal pulses.  Exam reveals no gallop.   Pulmonary/Chest: Effort normal and breath sounds normal.  No crackles  Abdominal: She exhibits no abdominal bruit.  Musculoskeletal: She exhibits no edema or tenderness.  Varicosities - small/spider type (significant bulging)= worse on LLE behind knee  Compressible  1 cm area of induration and injection behind L knee-nt    Neurological: She is alert. She has normal reflexes.  Skin: Skin is warm and dry. No rash noted. No pallor.  1 cm area of injection and induration behind L knee  Non tender  Also many compressible small varicosities   Note: cell phone pic from Thursday shows what looks like a blood blister in that area (poss ruptured vein)   Psychiatric: She has a normal mood and affect.          Assessment & Plan:   Problem List Items Addressed This Visit       Cardiovascular and Mediastinum   Varicosities of leg - Primary    Particularly bad behind L knee- bothersome at times Recent skin lump (resembling blood blister) on pic is probably related to a vein injury Much improved now  Enc pt to wear her support hose when she can  She cannot tolerate them in heat however Elevate feet  May consider vein clinic ref in the future-will let us know if she needs a ref         Musculoskeletal and Integument   Skin lesion    Began as what looked like a blood blister (? Poss rupt varicose vein) -now 1 cm erythema and induration  No tick seen or bullseye  No rash or other symptoms Disc vein care/ supp hose /elevation  For spot-cool compress/ otc cortisone cream prn  Update if not starting to improve in a week or if worsening        Relevant Orders   Td vaccine preservative free greater than or equal to 7yo IM (Completed)    Other Visit Diagnoses    Need for Td vaccine       Relevant Orders   Td vaccine preservative free greater than or equal to 7yo IM (Completed)

## 2016-05-17 NOTE — Assessment & Plan Note (Signed)
Began as what looked like a blood blister (? Poss rupt varicose vein) -now 1 cm erythema and induration  No tick seen or bullseye  No rash or other symptoms Disc vein care/ supp hose /elevation  For spot-cool compress/ otc cortisone cream prn  Update if not starting to improve in a week or if worsening

## 2016-05-17 NOTE — Progress Notes (Signed)
Pre visit review using our clinic review tool, if applicable. No additional management support is needed unless otherwise documented below in the visit note. 

## 2016-05-17 NOTE — Assessment & Plan Note (Signed)
Particularly bad behind L knee- bothersome at times Recent skin lump (resembling blood blister) on pic is probably related to a vein injury Much improved now  Enc pt to wear her support hose when she can  She cannot tolerate them in heat however Elevate feet  May consider vein clinic ref in the future-will let us know if she needs a ref

## 2016-09-15 ENCOUNTER — Encounter: Payer: Self-pay | Admitting: Obstetrics & Gynecology

## 2016-11-21 ENCOUNTER — Telehealth: Payer: Self-pay | Admitting: Family Medicine

## 2016-11-21 DIAGNOSIS — E785 Hyperlipidemia, unspecified: Secondary | ICD-10-CM

## 2016-11-21 DIAGNOSIS — I10 Essential (primary) hypertension: Secondary | ICD-10-CM

## 2016-11-21 NOTE — Telephone Encounter (Signed)
-----   Message from Eustace Pen, LPN sent at [id number]  4:51 PM EDT ----- Regarding: Labs 11/2 Lab orders needed. Thank you.  Insurance:  [facility] Medicare

## 2016-11-26 ENCOUNTER — Ambulatory Visit (INDEPENDENT_AMBULATORY_CARE_PROVIDER_SITE_OTHER): Payer: Medicare Other

## 2016-11-26 VITALS — BP 118/70 | HR 76 | Temp 97.7°F | Ht 65.0 in | Wt 153.0 lb

## 2016-11-26 DIAGNOSIS — I10 Essential (primary) hypertension: Secondary | ICD-10-CM

## 2016-11-26 DIAGNOSIS — Z0000 Encounter for general adult medical examination without abnormal findings: Secondary | ICD-10-CM | POA: Diagnosis not present

## 2016-11-26 DIAGNOSIS — Z23 Encounter for immunization: Secondary | ICD-10-CM | POA: Diagnosis not present

## 2016-11-26 DIAGNOSIS — E785 Hyperlipidemia, unspecified: Secondary | ICD-10-CM

## 2016-11-26 LAB — COMPREHENSIVE METABOLIC PANEL
ALBUMIN: 4.3 g/dL (ref 3.5–5.2)
ALK PHOS: 50 U/L (ref 39–117)
ALT: 13 U/L (ref 0–35)
AST: 14 U/L (ref 0–37)
BILIRUBIN TOTAL: 0.5 mg/dL (ref 0.2–1.2)
BUN: 16 mg/dL (ref 6–23)
CALCIUM: 10.3 mg/dL (ref 8.4–10.5)
CO2: 34 meq/L — AB (ref 19–32)
CREATININE: 0.68 mg/dL (ref 0.40–1.20)
Chloride: 102 mEq/L (ref 96–112)
GFR: 88.28 mL/min (ref >60.00)
Glucose, Bld: 100 mg/dL — ABNORMAL HIGH (ref 70–99)
Potassium: 5 mEq/L (ref 3.5–5.1)
Sodium: 141 mEq/L (ref 135–145)
TOTAL PROTEIN: 7 g/dL (ref 6.0–8.3)

## 2016-11-26 LAB — CBC WITH DIFFERENTIAL/PLATELET
BASOS ABS: 0.1 10*3/uL (ref 0.0–0.1)
Basophils Relative: 1.3 % (ref 0.0–3.0)
Eosinophils Absolute: 0.1 10*3/uL (ref 0.0–0.7)
Eosinophils Relative: 2 % (ref 0.0–5.0)
HCT: 39.6 % (ref 36.0–46.0)
HEMOGLOBIN: 13.1 g/dL (ref 12.0–15.0)
Lymphocytes Relative: 31.5 % (ref 12.0–46.0)
Lymphs Abs: 1.8 10*3/uL (ref 0.7–4.0)
MCHC: 33.1 g/dL (ref 30.0–36.0)
MCV: 84.4 fl (ref 78.0–100.0)
MONOS PCT: 6.8 % (ref 3.0–12.0)
Monocytes Absolute: 0.4 10*3/uL (ref 0.1–1.0)
NEUTROS ABS: 3.3 10*3/uL (ref 1.4–7.7)
Neutrophils Relative %: 58.4 % (ref 43.0–77.0)
PLATELETS: 261 10*3/uL (ref 150.0–400.0)
RBC: 4.69 Mil/uL (ref 3.87–5.11)
RDW: 14.1 % (ref 11.5–15.5)
WBC: 5.7 10*3/uL (ref 4.0–10.5)

## 2016-11-26 LAB — LIPID PANEL
Cholesterol: 203 mg/dL — ABNORMAL HIGH (ref 0–200)
HDL: 49.5 mg/dL (ref >39.00)
LDL Cholesterol: 132 mg/dL — ABNORMAL HIGH (ref 0–99)
NonHDL: 153
TRIGLYCERIDES: 105 mg/dL (ref 0.0–149.0)
Total CHOL/HDL Ratio: 4
VLDL: 21 mg/dL (ref 0.0–40.0)

## 2016-11-26 LAB — TSH: TSH: 1.63 u[IU]/mL (ref 0.35–4.50)

## 2016-11-26 NOTE — Patient Instructions (Signed)
Sierra Bentley , Thank you for taking time to come for your Medicare Wellness Visit. I appreciate your ongoing commitment to your health goals. Please review the following plan we discussed and let me know if I can assist you in the future.   These are the goals we discussed: Goals    . Increase water intake          Starting 11/26/2016, I will attempt to drink at least 6-8 glasses of water daily.        This is a list of the screening recommended for you and due dates:  Health Maintenance  Topic Date Due  . Mammogram  09/15/2017  . Tetanus Vaccine  05/18/2026  . Flu Shot  Completed  . DEXA scan (bone density measurement)  Completed  . Pneumonia vaccines  Completed   Preventive Care for Adults  A healthy lifestyle and preventive care can promote health and wellness. Preventive health guidelines for adults include the following key practices.  . A routine yearly physical is a good way to check with your health care provider about your health and preventive screening. It is a chance to share any concerns and updates on your health and to receive a thorough exam.  . Visit your dentist for a routine exam and preventive care every 6 months. Brush your teeth twice a day and floss once a day. Good oral hygiene prevents tooth decay and gum disease.  . The frequency of eye exams is based on your age, health, family medical history, use  of contact lenses, and other factors. Follow your health care provider's recommendations for frequency of eye exams.  . Eat a healthy diet. Foods like vegetables, fruits, whole grains, low-fat dairy products, and lean protein foods contain the nutrients you need without too many calories. Decrease your intake of foods high in solid fats, added sugars, and salt. Eat the right amount of calories for you. Get information about a proper diet from your health care provider, if necessary.  . Regular physical exercise is one of the most important things you can do for  your health. Most adults should get at least 150 minutes of moderate-intensity exercise (any activity that increases your heart rate and causes you to sweat) each week. In addition, most adults need muscle-strengthening exercises on 2 or more days a week.  Silver Sneakers may be a benefit available to you. To determine eligibility, you may visit the website: www.silversneakers.com or contact program at (810)808-7153 Mon-Fri between 8AM-8PM.   . Maintain a healthy weight. The body mass index (BMI) is a screening tool to identify possible weight problems. It provides an estimate of body fat based on height and weight. Your health care provider can find your BMI and can help you achieve or maintain a healthy weight.   For adults 20 years and older: ? A BMI below 18.5 is considered underweight. ? A BMI of 18.5 to 24.9 is normal. ? A BMI of 25 to 29.9 is considered overweight. ? A BMI of 30 and above is considered obese.   . Maintain normal blood lipids and cholesterol levels by exercising and minimizing your intake of saturated fat. Eat a balanced diet with plenty of fruit and vegetables. Blood tests for lipids and cholesterol should begin at age 41 and be repeated every 5 years. If your lipid or cholesterol levels are high, you are over 50, or you are at high risk for heart disease, you may need your cholesterol levels checked more frequently.  Ongoing high lipid and cholesterol levels should be treated with medicines if diet and exercise are not working.  . If you smoke, find out from your health care provider how to quit. If you do not use tobacco, please do not start.  . If you choose to drink alcohol, please do not consume more than 2 drinks per day. One drink is considered to be 12 ounces (355 mL) of beer, 5 ounces (148 mL) of wine, or 1.5 ounces (44 mL) of liquor.  . If you are 24-62 years old, ask your health care provider if you should take aspirin to prevent strokes.  . Use sunscreen.  Apply sunscreen liberally and repeatedly throughout the day. You should seek shade when your shadow is shorter than you. Protect yourself by wearing long sleeves, pants, a wide-brimmed hat, and sunglasses year round, whenever you are outdoors.  . Once a month, do a whole body skin exam, using a mirror to look at the skin on your back. Tell your health care provider of new moles, moles that have irregular borders, moles that are larger than a pencil eraser, or moles that have changed in shape or color.

## 2016-11-26 NOTE — Progress Notes (Signed)
PCP notes:   Health maintenance:  Flu vaccine - administered  Abnormal screenings:   None  Patient concerns:   None  Nurse concerns:  None  Next PCP appt:   11/30/16 @ 1430  I reviewed health advisor's note, was available for consultation, and agree with documentation and plan. Loura Pardon MD

## 2016-11-26 NOTE — Progress Notes (Signed)
Subjective:   Sierra Bentley is a 82 y.o. female who presents for Medicare Annual (Subsequent) preventive examination.  Review of Systems:  N/A Cardiac Risk Factors include: advanced age (>84men, >74 women);dyslipidemia;hypertension     Objective:     Vitals: BP 118/70 (BP Location: Right Arm, Patient Position: Sitting, Cuff Size: Normal)   Pulse 76   Temp 97.7 F (36.5 C) (Oral)   Ht 5\' 5"  (1.651 m) Comment: shoes  Wt 153 lb (69.4 kg)   SpO2 97%   BMI 25.46 kg/m   Body mass index is 25.46 kg/m.   Tobacco History  Smoking Status  . Never Smoker  Smokeless Tobacco  . Never Used     Counseling given: No   Past Medical History:  Diagnosis Date  . Allergic rhinitis, cause unspecified   . Cramp of limb   . Diverticulosis   . Epistaxis   . GERD (gastroesophageal reflux disease)   . HTN (hypertension)   . Osteopenia   . Other and unspecified hyperlipidemia   . Other malaise and fatigue   . Rheumatoid arthritis(714.0)   . Urine incontinence    Past Surgical History:  Procedure Laterality Date  . BREAST BIOPSY  2/11   fibrocystic change  . BREAST BIOPSY  8/11   fibrocystic change  . COLONOSCOPY  2003  . CYSTOSCOPY  2006  . MOLE REMOVAL     rectal  . TONSILLECTOMY     Family History  Problem Relation Age of Onset  . Heart attack Father 34  . Coronary artery disease Father   . Other Mother        Arrythmia  . Coronary artery disease Mother   . Hypertension Mother   . Anxiety disorder Mother   . Breast cancer Unknown        2nd cousin  . Depression Unknown        family history   History  Sexual Activity  . Sexual activity: No    Outpatient Encounter Prescriptions as of 11/26/2016  Medication Sig  . fish oil-omega-3 fatty acids 1000 MG capsule Take 1 g by mouth daily.  Marland Kitchen losartan-hydrochlorothiazide (HYZAAR) 50-12.5 MG tablet TAKE 0.5 TABLET (1/2TABLET) BY MOUTH DAILY.  . mometasone (NASONEX) 50 MCG/ACT nasal spray Place 1 spray into the nose  daily as needed.  . Multiple Vitamin (MULTIVITAMIN) tablet Take 1 tablet by mouth daily.    . pantoprazole (PROTONIX) 40 MG tablet Take 40 mg by mouth daily as needed.   Vladimir Faster Glycol-Propyl Glycol (SYSTANE ULTRA OP) Place 1 drop into both eyes 4 (four) times daily.    . sodium chloride (MURO 128) 5 % ophthalmic ointment Place 1 drop into both eyes at bedtime.   . TURMERIC PO Take 2 capsules by mouth daily before breakfast.  . cyclobenzaprine (FLEXERIL) 10 MG tablet Take 1 tablet (10 mg total) by mouth at bedtime as needed for muscle spasms (in neck). (Patient not taking: Reported on 05/17/2016)   No facility-administered encounter medications on file as of 11/26/2016.     Activities of Daily Living In your present state of health, do you have any difficulty performing the following activities: 11/26/2016  Hearing? Y  Vision? N  Difficulty concentrating or making decisions? Y  Walking or climbing stairs? Y  Comment knee pain  Dressing or bathing? N  Doing errands, shopping? N  Preparing Food and eating ? N  Using the Toilet? N  In the past six months, have you accidently leaked urine?  Y  Do you have problems with loss of bowel control? N  Managing your Medications? N  Managing your Finances? N  Housekeeping or managing your Housekeeping? N  Some recent data might be hidden    Patient Care Team: Tower, [name], MD as PCP - General Beverly Gust, MD as Referring Physician (Otolaryngology) Shirlee More, MD as Referring Physician (Ophthalmology)    Assessment:    Hearing Screening Comments: Bilateral hearing aids Vision Screening Comments: Last vision exam in Sept 2018 with Dr. Fletcher Anon @ [facility]  Exercise Activities and Dietary recommendations Current Exercise Habits: The patient does not participate in regular exercise at present, Exercise limited by: orthopedic condition(s)  Goals    . Increase water intake          Starting 11/26/2016, I will  attempt to drink at least 6-8 glasses of water daily.       Fall Risk Fall Risk  11/26/2016 08/29/2015 08/27/2014 07/25/2013 03/03/2012  Falls in the past year? No Yes Yes Yes No  Comment - pt tripped while on vacation; pt fell while playing with grandchildren - - -  Number falls in past yr: - 2 or more - 1 -  Injury with Fall? - Yes Yes Yes -  Risk Factor Category  - High Fall Risk - - -  Risk for fall due to : - History of fall(s) Other (Comment) - -  Follow up - Falls evaluation completed;Falls prevention discussed - - -   Depression Screen PHQ 2/9 Scores 11/26/2016 08/29/2015 08/27/2014 07/25/2013  PHQ - 2 Score 0 0 0 0  PHQ- 9 Score 0 - - -     Cognitive Function MMSE - Mini Mental State Exam 11/26/2016 08/29/2015  Orientation to time 5 5  Orientation to Place 5 5  Registration 3 3  Attention/ Calculation 0 0  Recall 3 2  Recall-comments - pt was unable to recall 1 of 3 words  Language- name 2 objects 0 0  Language- repeat 1 1  Language- follow 3 step command 3 3  Language- read & follow direction 0 0  Write a sentence 0 0  Copy design 0 0  Total score 20 19     PLEASE NOTE: A Mini-Cog screen was completed. Maximum score is 20. A value of 0 denotes this part of Folstein MMSE was not completed or the patient failed this part of the Mini-Cog screening.   Mini-Cog Screening Orientation to Time - Max 5 pts Orientation to Place - Max 5 pts Registration - Max 3 pts Recall - Max 3 pts Language Repeat - Max 1 pts Language Follow 3 Step Command - Max 3 pts     Immunization History  Administered Date(s) Administered  . Influenza Split 11/30/2010, 12/16/2011  . Influenza Whole 10/26/2007  . Influenza,inj,Quad PF,6+ Mos 11/09/2012, 11/16/2013, 01/03/2015, 11/06/2015, 11/26/2016  . Pneumococcal Conjugate-13 07/25/2013  . Pneumococcal Polysaccharide-23 11/30/2010  . Td 01/25/2005, 05/17/2016  . Zoster 03/29/2008   Screening Tests Health Maintenance  Topic Date Due  . MAMMOGRAM   09/15/2017  . TETANUS/TDAP  05/18/2026  . INFLUENZA VACCINE  Completed  . DEXA SCAN  Completed  . PNA vac Low Risk Adult  Completed      Plan:   I have personally reviewed, addressed, and noted the following in the patient's chart:  A. Medical and social history B. Use of alcohol, tobacco or illicit drugs  C. Current medications and supplements D. Functional ability and status E.  Nutritional status F.  Physical activity G. Advance directives H. List of other physicians I.  Hospitalizations, surgeries, and ER visits in previous 12 months J.  [place] to include hearing, vision, cognitive, depression L. Referrals and appointments - none  In addition, I have reviewed and discussed with patient certain preventive protocols, quality metrics, and best practice recommendations. A written personalized care plan for preventive services as well as general preventive health recommendations were provided to patient.  See attached scanned questionnaire for additional information.   Signed,   Lindell Noe, MHA, BS, LPN Health Coach

## 2016-11-26 NOTE — Progress Notes (Signed)
Pre visit review using our clinic review tool, if applicable. No additional management support is needed unless otherwise documented below in the visit note. 

## 2016-11-29 ENCOUNTER — Other Ambulatory Visit: Payer: Self-pay | Admitting: Family Medicine

## 2016-11-30 ENCOUNTER — Ambulatory Visit (INDEPENDENT_AMBULATORY_CARE_PROVIDER_SITE_OTHER): Payer: Medicare Other | Admitting: Family Medicine

## 2016-11-30 ENCOUNTER — Encounter: Payer: Self-pay | Admitting: Family Medicine

## 2016-11-30 VITALS — BP 126/64 | HR 78 | Temp 98.0°F | Ht 65.0 in | Wt 154.0 lb

## 2016-11-30 DIAGNOSIS — E785 Hyperlipidemia, unspecified: Secondary | ICD-10-CM | POA: Diagnosis not present

## 2016-11-30 DIAGNOSIS — I10 Essential (primary) hypertension: Secondary | ICD-10-CM

## 2016-11-30 DIAGNOSIS — R7303 Prediabetes: Secondary | ICD-10-CM | POA: Insufficient documentation

## 2016-11-30 DIAGNOSIS — Z0000 Encounter for general adult medical examination without abnormal findings: Secondary | ICD-10-CM | POA: Diagnosis not present

## 2016-11-30 DIAGNOSIS — M810 Age-related osteoporosis without current pathological fracture: Secondary | ICD-10-CM

## 2016-11-30 MED ORDER — LOSARTAN POTASSIUM-HCTZ 50-12.5 MG PO TABS: 0.5000 | ORAL_TABLET | Freq: Every day | ORAL | 3 refills | Status: DC

## 2016-11-30 NOTE — Assessment & Plan Note (Signed)
Reviewed health habits including diet and exercise and skin cancer prevention Reviewed appropriate screening tests for age  Also reviewed health mt list, fam hx and immunization status , as well as social and family history   See HPI  amw rev  Labs reviewed  Declines colon cancer screening

## 2016-11-30 NOTE — Assessment & Plan Note (Signed)
Mildly elevated glucose  Will watch disc imp of low glycemic diet and wt loss to prevent DM2

## 2016-11-30 NOTE — Assessment & Plan Note (Signed)
bp in fair control at this time  BP Readings from Last 1 Encounters:  11/30/16 126/64   No changes needed Disc lifstyle change with low sodium diet and exercise  Labs rev

## 2016-11-30 NOTE — Progress Notes (Signed)
Subjective:    Patient ID: Sierra Bentley, female    DOB: [birth year], 83 y.o.   MRN: [id number]  HPI Here for health maintenance exam and to review chronic medical problems    Feeling pretty good overall   Did lifeline screening  Indicated prediabetes  OA of knees Had injection  She is very careful about walking    Had amw on 11/2 Had flu shot  No concerns   Has inner ear problems - causes dizziness/poor balance  Also hx of head injury in the past  She did do PT for balance in the past    Wt Readings from Last 3 Encounters:  11/30/16 154 lb (69.9 kg)  11/26/16 153 lb (69.4 kg)  05/17/16 154 lb 4 oz (70 kg)  does not walk as well as she used to -balance is not as good  25.63 kg/m  She works to [employer] her weight  Not exercising currently   Mammogram 8/18-nl  Self breat exam no lumps    dexa 3/18 osteoporosis  No falls recently  or fractures - is careful  She has had problems with fosamax Sees gyn for this -obs for now  Takes ca and D   zostavax 3/10  Colon cancer screen- declined   She sees derm for skin checks Has appt in dec  Tries not to get sunburned   Sees gyn for gyn care yearly- pelvic and breast   bp is stable today  No cp or palpitations or headaches or edema  No side effects to medicines  BP Readings from Last 3 Encounters:  11/30/16 126/64  11/26/16 118/70  05/17/16 116/62       Hyperlipidemia Lab Results  Component Value Date   CHOL 203 (H) 11/26/2016   CHOL 212 (H) 08/29/2015   CHOL 201 (H) 08/23/2014   Lab Results  Component Value Date   HDL 49.50 11/26/2016   HDL 56.90 08/29/2015   HDL 45.40 08/23/2014   Lab Results  Component Value Date   LDLCALC 132 (H) 11/26/2016   LDLCALC 133 (H) 08/29/2015   LDLCALC 117 (H) 08/23/2014   Lab Results  Component Value Date   TRIG 105.0 11/26/2016   TRIG 110.0 08/29/2015   TRIG 191.0 (H) 08/23/2014   Lab Results  Component Value Date   CHOLHDL 4 11/26/2016   CHOLHDL 4  08/29/2015   CHOLHDL 4 08/23/2014   Lab Results  Component Value Date   LDLDIRECT 130.4 02/25/2012   LDLDIRECT 134.1 08/18/2011   LDLDIRECT 155.9 11/30/2010   fairly stable Diet is low sat and trans fats   Other labs Results for orders placed or performed in visit on 11/26/16  CBC with Differential/Platelet  Result Value Ref Range   WBC 5.7 4.0 - 10.5 K/uL   RBC 4.69 3.87 - 5.11 Mil/uL   Hemoglobin 13.1 12.0 - 15.0 g/dL   HCT 39.6 36.0 - 46.0 %   MCV 84.4 78.0 - 100.0 fl   MCHC 33.1 30.0 - 36.0 g/dL   RDW 14.1 11.5 - 15.5 %   Platelets 261.0 150.0 - 400.0 K/uL   Neutrophils Relative % 58.4 43.0 - 77.0 %   Lymphocytes Relative 31.5 12.0 - 46.0 %   Monocytes Relative 6.8 3.0 - 12.0 %   Eosinophils Relative 2.0 0.0 - 5.0 %   Basophils Relative 1.3 0.0 - 3.0 %   Neutro Abs 3.3 1.4 - 7.7 K/uL   Lymphs Abs 1.8 0.7 - 4.0 K/uL   Monocytes Absolute  0.4 0.1 - 1.0 K/uL   Eosinophils Absolute 0.1 0.0 - 0.7 K/uL   Basophils Absolute 0.1 0.0 - 0.1 K/uL  Comprehensive metabolic panel  Result Value Ref Range   Sodium 141 135 - 145 mEq/L   Potassium 5.0 3.5 - 5.1 mEq/L   Chloride 102 96 - 112 mEq/L   CO2 34 (H) 19 - 32 mEq/L   Glucose, Bld 100 (H) 70 - 99 mg/dL   BUN 16 6 - 23 mg/dL   Creatinine, Ser 0.68 0.40 - 1.20 mg/dL   Total Bilirubin 0.5 0.2 - 1.2 mg/dL   Alkaline Phosphatase 50 39 - 117 U/L   AST 14 0 - 37 U/L   ALT 13 0 - 35 U/L   Total Protein 7.0 6.0 - 8.3 g/dL   Albumin 4.3 3.5 - 5.2 g/dL   Calcium 10.3 8.4 - 10.5 mg/dL   GFR 88.28 >60.00 mL/min  Lipid panel  Result Value Ref Range   Cholesterol 203 (H) 0 - 200 mg/dL   Triglycerides 105.0 0.0 - 149.0 mg/dL   HDL 49.50 >39.00 mg/dL   VLDL 21.0 0.0 - 40.0 mg/dL   LDL Cholesterol 132 (H) 0 - 99 mg/dL   Total CHOL/HDL Ratio 4    NonHDL 153.00   TSH  Result Value Ref Range   TSH 1.63 0.35 - 4.50 uIU/mL     Patient Active Problem List   Diagnosis Date Noted  . Prediabetes 11/30/2016  . Skin lesion 05/17/2016    . Spontaneous bruising 10/03/2015  . Toenail fungus 10/03/2015  . At moderate risk for fall 09/05/2015  . Cervical spondylosis without myelopathy 08/05/2015  . Neck pain on left side 08/04/2015  . Routine general medical examination at a health care facility 08/27/2014  . Colon cancer screening 08/27/2014  . Fall against object 08/15/2014  . Thoracic back pain 08/14/2014  . Vaccine reaction 08/03/2013  . Encounter for Medicare annual wellness exam 07/25/2013  . Varicosities of leg 05/23/2012  . Skin lesion of right lower limb 05/23/2012  . Abnormal ultrasound of thyroid gland 03/01/2011  . Hyperlipidemia 06/11/2008  . ALLERGIC RHINITIS 03/29/2008  . Essential hypertension, benign 12/26/2006  . GERD 12/26/2006  . RHEUMATOID ARTHRITIS 12/26/2006  . Osteoporosis 12/26/2006  . URINARY INCONTINENCE 12/26/2006   Past Medical History:  Diagnosis Date  . Allergic rhinitis, cause unspecified   . Cramp of limb   . Diverticulosis   . Epistaxis   . GERD (gastroesophageal reflux disease)   . HTN (hypertension)   . Osteopenia   . Other and unspecified hyperlipidemia   . Other malaise and fatigue   . Rheumatoid arthritis(714.0)   . Urine incontinence    Past Surgical History:  Procedure Laterality Date  . BREAST BIOPSY  2/11   fibrocystic change  . BREAST BIOPSY  8/11   fibrocystic change  . COLONOSCOPY  2003  . CYSTOSCOPY  2006  . MOLE REMOVAL     rectal  . TONSILLECTOMY     Social History   Tobacco Use  . Smoking status: Never Smoker  . Smokeless tobacco: Never Used  Substance Use Topics  . Alcohol use: No    Alcohol/week: 0.0 oz  . Drug use: No   Family History  Problem Relation Age of Onset  . Heart attack Father 12  . Coronary artery disease Father   . Other Mother        Arrythmia  . Coronary artery disease Mother   . Hypertension Mother   .  Anxiety disorder Mother   . Breast cancer Unknown        2nd cousin  . Depression Unknown        family history    Allergies  Allergen Reactions  . Ace Inhibitors     REACTION: cough  . Alendronate Sodium     REACTION: reflux, trouble swallowing  . Chocolate   . Dust Mite Extract   . Ibandronate Sodium     REACTION: reflux and increased BP   Current Outpatient Medications on File Prior to Visit  Medication Sig Dispense Refill  . cyclobenzaprine (FLEXERIL) 10 MG tablet Take 1 tablet (10 mg total) by mouth at bedtime as needed for muscle spasms (in neck). 30 tablet 1  . fish oil-omega-3 fatty acids 1000 MG capsule Take 1 g by mouth daily.    . mometasone (NASONEX) 50 MCG/ACT nasal spray Place 1 spray into the nose daily as needed. 17 g 11  . Multiple Vitamin (MULTIVITAMIN) tablet Take 1 tablet by mouth daily.      . pantoprazole (PROTONIX) 40 MG tablet Take 40 mg by mouth daily as needed.   11  . Polyethyl Glycol-Propyl Glycol (SYSTANE ULTRA OP) Place 1 drop into both eyes 4 (four) times daily.      . sodium chloride (MURO 128) 5 % ophthalmic ointment Place 1 drop into both eyes at bedtime.     . TURMERIC PO Take 2 capsules by mouth daily before breakfast.     No current facility-administered medications on file prior to visit.     Review of Systems  Constitutional: Negative for activity change, appetite change, fatigue, fever and unexpected weight change.  HENT: Negative for congestion, ear pain, rhinorrhea, sinus pressure and sore throat.   Eyes: Negative for pain, redness and visual disturbance.  Respiratory: Negative for cough, shortness of breath and wheezing.   Cardiovascular: Negative for chest pain and palpitations.  Gastrointestinal: Negative for abdominal pain, blood in stool, constipation and diarrhea.  Endocrine: Negative for polydipsia and polyuria.  Genitourinary: Negative for dysuria, frequency and urgency.  Musculoskeletal: Negative for arthralgias, back pain and myalgias.  Skin: Negative for pallor and rash.  Allergic/Immunologic: Negative for environmental allergies.   Neurological: Positive for dizziness. Negative for tremors, seizures, syncope, speech difficulty, weakness, numbness and headaches.  Hematological: Negative for adenopathy. Does not bruise/bleed easily.  Psychiatric/Behavioral: Negative for decreased concentration and dysphoric mood. The patient is not nervous/anxious.        Objective:   Physical Exam  Constitutional: She appears well-developed and well-nourished. No distress.  HENT:  Head: Normocephalic and atraumatic.  Right Ear: External ear normal.  Left Ear: External ear normal.  Mouth/Throat: Oropharynx is clear and moist.  Eyes: Conjunctivae and EOM are normal. Pupils are equal, round, and reactive to light. No scleral icterus.  Neck: Normal range of motion. Neck supple. No JVD present. Carotid bruit is not present. No thyromegaly present.  Cardiovascular: Normal rate, regular rhythm, normal heart sounds and intact distal pulses. Exam reveals no gallop.  Pulmonary/Chest: Effort normal and breath sounds normal. No respiratory distress. She has no wheezes. She exhibits no tenderness.  Abdominal: Soft. Bowel sounds are normal. She exhibits no distension, no abdominal bruit and no mass. There is no tenderness.  Musculoskeletal: Normal range of motion. She exhibits no edema or tenderness.  Lymphadenopathy:    She has no cervical adenopathy.  Neurological: She is alert. She has normal reflexes. No cranial nerve deficit. She exhibits normal muscle tone. Coordination normal.  Skin: Skin is warm and dry. No rash noted. No erythema. No pallor.  Lentigines and skin tags on trunk   Psychiatric: She has a normal mood and affect.          Assessment & Plan:   Problem List Items Addressed This Visit      Cardiovascular and Mediastinum   Essential hypertension, benign - Primary    bp in fair control at this time  BP Readings from Last 1 Encounters:  11/30/16 126/64   No changes needed Disc lifstyle change with low sodium diet and  exercise  Labs rev       Relevant Medications   losartan-hydrochlorothiazide (HYZAAR) 50-12.5 MG tablet     Musculoskeletal and Integument   Osteoporosis    Rev dexa Followed by gyn  No falls /fx recent  On ca and D Intol of bisphosphenate  Not open to other medicines at this time Has done PT for balance and fall prev        Other   Hyperlipidemia    Disc goals for lipids and reasons to control them Rev labs with pt Rev low sat fat diet in detail   Would like LDL lower-given diet info      Relevant Medications   losartan-hydrochlorothiazide (HYZAAR) 50-12.5 MG tablet   Prediabetes    Mildly elevated glucose  Will watch disc imp of low glycemic diet and wt loss to prevent DM2       Routine general medical examination at a health care facility    Reviewed health habits including diet and exercise and skin cancer prevention Reviewed appropriate screening tests for age  Also reviewed health mt list, fam hx and immunization status , as well as social and family history   See HPI  amw rev  Labs reviewed  Declines colon cancer screening

## 2016-11-30 NOTE — Patient Instructions (Addendum)
Try to get more exercise  30 or more minutes 5 days per week  Consider a recumbent exercise bike at a gym or get one  Getting strong is important to retaining balance   For prediabetes-watch sweets and carbs  Try to get most of your carbohydrates from produce (with the exception of white potatoes)  Eat less bread/pasta/rice/snack foods/cereals/sweets and other items from the middle of the grocery store (processed carbs)   Continue your calcium and vitamin D   Be careful not to fall

## 2016-11-30 NOTE — Assessment & Plan Note (Signed)
Rev dexa Followed by gyn  No falls /fx recent  On ca and D Intol of bisphosphenate  Not open to other medicines at this time Has done PT for balance and fall prev

## 2016-11-30 NOTE — Assessment & Plan Note (Signed)
Disc goals for lipids and reasons to control them Rev labs with pt Rev low sat fat diet in detail   Would like LDL lower-given diet info

## 2016-12-08 ENCOUNTER — Ambulatory Visit: Payer: Medicare Other | Admitting: Family Medicine

## 2016-12-08 ENCOUNTER — Encounter: Payer: Self-pay | Admitting: Family Medicine

## 2016-12-08 ENCOUNTER — Ambulatory Visit: Payer: Self-pay | Admitting: *Deleted

## 2016-12-08 VITALS — BP 120/70 | HR 86 | Temp 98.3°F | Wt 153.5 lb

## 2016-12-08 DIAGNOSIS — J069 Acute upper respiratory infection, unspecified: Secondary | ICD-10-CM

## 2016-12-08 DIAGNOSIS — H6123 Impacted cerumen, bilateral: Secondary | ICD-10-CM | POA: Diagnosis not present

## 2016-12-08 MED ORDER — HYDROCODONE-HOMATROPINE 5-1.5 MG/5ML PO SYRP: 5.0000 mL | ORAL_SOLUTION | Freq: Three times a day (TID) | ORAL | 0 refills | Status: DC | PRN

## 2016-12-08 MED ORDER — HYDROCODONE-HOMATROPINE 5-1.5 MG/5ML PO SYRP: ORAL_SOLUTION | ORAL | 0 refills | Status: DC

## 2016-12-08 NOTE — Progress Notes (Signed)
Subjective:    Patient ID: Sierra Bentley, female    DOB: Feb 27, 1933, 83 y.o.   MRN: [id number]  No chief complaint on file.   HPI Patient was seen today for acute concern.  Pt is seen at [facility].  Patient endorses cough, headache, sneezing, sore throat, nasal congestion, and rhinorrhea since Sunday.  Also has soreness in L side 2/2 coughing.  Patient denies fever and chills.  Sick contacts include patient's son, his wife and patient's husband.  Patient has tried Coricidin for her symptoms.  Patient is insistent that she get something for her cough.  Past Medical History:  Diagnosis Date  . Allergic rhinitis, cause unspecified   . Cramp of limb   . Diverticulosis   . Epistaxis   . GERD (gastroesophageal reflux disease)   . HTN (hypertension)   . Osteopenia   . Other and unspecified hyperlipidemia   . Other malaise and fatigue   . Rheumatoid arthritis(714.0)   . Urine incontinence     Allergies  Allergen Reactions  . Ace Inhibitors     REACTION: cough  . Alendronate Sodium     REACTION: reflux, trouble swallowing  . Chocolate   . Dust Mite Extract   . Ibandronate Sodium     REACTION: reflux and increased BP    ROS General: Denies fever, chills, night sweats, changes in weight, changes in appetite HEENT: Denies ear pain, changes in vision  +HA, rhinorrhea, sore throat, sneezing, nasal congestion CV: Denies CP, palpitations, SOB, orthopnea Pulm: Denies SOB, wheezing  +cough GI: Denies abdominal pain, nausea, vomiting, diarrhea, constipation GU: Denies dysuria, hematuria, frequency, vaginal discharge Msk: Denies muscle cramps, joint pains Neuro: Denies weakness, numbness, tingling Skin: Denies rashes, bruising Psych: Denies depression, anxiety, hallucinations     Objective:    Blood pressure 120/70, pulse 86, temperature 98.3 F (36.8 C), temperature source Oral, weight 153 lb 8 oz (69.6 kg), SpO2 96 %.   Gen. Pleasant, well-nourished, in no distress, normal  affect   HEENT: [place]/AT, face symmetric, no scleral icterus, PERRLA, nares patent with mild clear drainage, pharynx with minimal erythema or exudate.  Bilateral ear canals with impacted cerumen.  Bilateral canals irrigated.  TMs bilaterally visualized and full, no infection noted.  Base of left canal erythematous with abrasion.  B/l cervical lymphadenopathy noted. Lungs: cough, no accessory muscle use, CTAB, no wheezes or rales Cardiovascular: RRR, no m/r/g, no peripheral edema Neuro:  A&Ox3, CN II-XII intact, normal gait Skin:  Warm, no lesions/ rash   Wt Readings from Last 3 Encounters:  12/08/16 153 lb 8 oz (69.6 kg)  11/30/16 154 lb (69.9 kg)  11/26/16 153 lb (69.4 kg)    Lab Results  Component Value Date   WBC 5.7 11/26/2016   HGB 13.1 11/26/2016   HCT 39.6 11/26/2016   PLT 261.0 11/26/2016   GLUCOSE 100 (H) 11/26/2016   CHOL 203 (H) 11/26/2016   TRIG 105.0 11/26/2016   HDL 49.50 11/26/2016   LDLDIRECT 130.4 02/25/2012   LDLCALC 132 (H) 11/26/2016   ALT 13 11/26/2016   AST 14 11/26/2016   NA 141 11/26/2016   K 5.0 11/26/2016   CL 102 11/26/2016   CREATININE 0.68 11/26/2016   BUN 16 11/26/2016   CO2 34 (H) 11/26/2016   TSH 1.63 11/26/2016   INR 1.0 10/03/2015    Assessment/Plan:  URI with cough and congestion  -Supportive care -Patient advised to obtain OTC decongestant -We will give Rx for nighttime cough. To take 1/2  teaspoon qhs prn for cough. - Plan: HYDROcodone-homatropine (HYCODAN) 5-1.5 MG/5ML syrup  Bilateral impacted cerumen -B/l irrigation  Advised to f/u with pcp if feeling worse.

## 2016-12-08 NOTE — Telephone Encounter (Signed)
Symptoms are worsening each day, coughed all night long last night, no rest.  Reason for Disposition . [1] Continuous (nonstop) coughing interferes with work or school AND [2] no improvement using cough treatment per protocol  Answer Assessment - Initial Assessment Questions 1. ONSET: "When did the cough begin?"      Sunday 2. SEVERITY: "How bad is the cough today?"      steady coughing for 30 minutes 3. RESPIRATORY DISTRESS: "Describe your breathing."      Slightly labored 4. FEVER: "Do you have a fever?" If so, ask: "What is your temperature, how was it measured, and when did it start?"   dont think so 5. HEMOPTYSIS: "Are you coughing up any blood?" If so ask: "How much?" (flecks, streaks, tablespoons, etc.)    no 6. TREATMENT: "What have you done so far to treat the cough?" (e.g., meds, fluids, humidifier)     Coricidian, plenty of water 7. CARDIAC HISTORY: "Do you have any history of heart disease?" (e.g., heart attack, congestive heart failure)      no 8. LUNG HISTORY: "Do you have any history of lung disease?"  (e.g., pulmonary embolus, asthma, emphysema)     no 9. PE RISK FACTORS: "Do you have a history of blood clots?" (or: recent major surgery, recent prolonged travel, bedridden )     no 10. OTHER SYMPTOMS: "Do you have any other symptoms? (e.g., runny nose, wheezing, chest pain)      Runny nose, headache, chest aches on Lt side with coughing 11. PREGNANCY: "Is there any chance you are pregnant?" "When was your last menstrual period?"       no 12. TRAVEL: "Have you traveled out of the country in the last month?" (e.g., travel history, exposures)      no  Protocols used: COUGH - ACUTE NON-PRODUCTIVE-A-AH

## 2016-12-08 NOTE — Patient Instructions (Addendum)
You can take an over-the-counter decongestant for your symptoms, such as Sudafed.  Cough, Adult Coughing is a reflex that clears your throat and your airways. Coughing helps to heal and protect your lungs. It is normal to cough occasionally, but a cough that happens with other symptoms or lasts a long time may be a sign of a condition that needs treatment. A cough may last only 2-3 weeks (acute), or it may last longer than 8 weeks (chronic). What are the causes? Coughing is commonly caused by:  Breathing in substances that irritate your lungs.  A viral or bacterial respiratory infection.  Allergies.  Asthma.  Postnasal drip.  Smoking.  Acid backing up from the stomach into the esophagus (gastroesophageal reflux).  Certain medicines.  Chronic lung problems, including COPD (or rarely, lung cancer).  Other medical conditions such as heart failure.  Follow these instructions at home: Pay attention to any changes in your symptoms. Take these actions to help with your discomfort:  Take medicines only as told by your health care provider. ? If you were prescribed an antibiotic medicine, take it as told by your health care provider. Do not stop taking the antibiotic even if you start to feel better. ? Talk with your health care provider before you take a cough suppressant medicine.  Drink enough fluid to keep your urine clear or pale yellow.  If the air is dry, use a cold steam vaporizer or humidifier in your bedroom or your home to help loosen secretions.  Avoid anything that causes you to cough at work or at home.  If your cough is worse at night, try sleeping in a semi-upright position.  Avoid cigarette smoke. If you smoke, quit smoking. If you need help quitting, ask your health care provider.  Avoid caffeine.  Avoid alcohol.  Rest as needed.  Contact a health care provider if:  You have new symptoms.  You cough up pus.  Your cough does not get better after 2-3  weeks, or your cough gets worse.  You cannot control your cough with suppressant medicines and you are losing sleep.  You develop pain that is getting worse or pain that is not controlled with pain medicines.  You have a fever.  You have unexplained weight loss.  You have night sweats. Get help right away if:  You cough up blood.  You have difficulty breathing.  Your heartbeat is very fast. This information is not intended to replace advice given to you by your health care provider. Make sure you discuss any questions you have with your health care provider. Document Released: 07/10/2010 Document Revised: 06/19/2015 Document Reviewed: 03/20/2014 Elsevier Interactive Patient Education  2017 [employer].

## 2016-12-09 ENCOUNTER — Ambulatory Visit: Payer: Medicare Other | Admitting: Family Medicine

## 2017-04-28 ENCOUNTER — Other Ambulatory Visit: Payer: Self-pay | Admitting: *Deleted

## 2017-04-28 MED ORDER — HYDROCHLOROTHIAZIDE 12.5 MG PO CAPS: ORAL_CAPSULE | ORAL | 3 refills | Status: DC

## 2017-04-28 MED ORDER — LOSARTAN POTASSIUM 50 MG PO TABS: 25.0000 mg | ORAL_TABLET | Freq: Every day | ORAL | 3 refills | Status: DC

## 2017-04-28 NOTE — Telephone Encounter (Signed)
They do have tablets of HCTZ, since it wasn't in Epic, I just gave verbal Rx for both the losartan and HCTZ tabs

## 2017-04-28 NOTE — Telephone Encounter (Signed)
Received fax from [facility] [address] saying there is a back order on losartan-HCTZ, and request the Rx be split up into 2 separate Rxs, they have the 50mg  losartan separate, please send if it's okay

## 2017-04-28 NOTE — Telephone Encounter (Signed)
It looks like she takes 1/2 of her losartan hct so would need 6.25 of hctz  I only see capsule (cannot split) What does the pharmacy suggest doing?

## 2017-07-22 ENCOUNTER — Encounter: Payer: Self-pay | Admitting: Obstetrics & Gynecology

## 2017-07-22 ENCOUNTER — Ambulatory Visit: Payer: Medicare Other | Admitting: Obstetrics & Gynecology

## 2017-07-22 VITALS — BP 120/72 | Ht 63.75 in | Wt 152.0 lb

## 2017-07-22 DIAGNOSIS — M810 Age-related osteoporosis without current pathological fracture: Secondary | ICD-10-CM

## 2017-07-22 DIAGNOSIS — N3946 Mixed incontinence: Secondary | ICD-10-CM

## 2017-07-22 DIAGNOSIS — Z01419 Encounter for gynecological examination (general) (routine) without abnormal findings: Secondary | ICD-10-CM

## 2017-07-22 DIAGNOSIS — Z780 Asymptomatic menopausal state: Secondary | ICD-10-CM | POA: Diagnosis not present

## 2017-07-22 NOTE — Progress Notes (Signed)
SAUL DORSI Sep 24, 1933 [id number]   History:    84 y.o. G0 Married  RP:  Established patient presenting for annual gyn exam   HPI: Menopause, well on no hormone replacement therapy.  Abstinent.  Was using Replens for dryness, but feels that it is irritating her at the vulva.  No postmenopausal bleeding.  No pelvic pain.  No urinary tract infection symptoms.  Frequent urinary incontinence sometimes with effort and as other times just a feeling of urgency. Last Pap test in February 2016 was negative.  Breasts normal.  Body mass index 26.3.  Physically active.  Fasting health labs with family physician.  Osteoporosis on last bone density.  GERD on Protonix.  Prefers no medical treatment at this time, taking vitamin D supplements, calcium rich nutrition and doing regular weightbearing physical activities.  Past medical history,surgical history, family history and social history were all reviewed and documented in the EPIC chart.  Gynecologic History No LMP recorded. Patient is postmenopausal. Contraception: abstinence and post menopausal status Last Pap: 02/2014. Results were: Negative Last mammogram: 08/2016. Results were: Negative Bone Density: 03/2016 Osteoporosis at Rt Femoral Neck T-Score -2.6 Colonoscopy: 2013  Obstetric History OB History  Gravida Para Term Preterm AB Living  0 0 0 0 0 0  SAB TAB Ectopic Multiple Live Births  0 0 0 0 0  Obstetric Comments  HAS 2 STEP SONS      ROS: A ROS was performed and pertinent positives and negatives are included in the history.  GENERAL: No fevers or chills. HEENT: No change in vision, no earache, sore throat or sinus congestion. NECK: No pain or stiffness. CARDIOVASCULAR: No chest pain or pressure. No palpitations. PULMONARY: No shortness of breath, cough or wheeze. GASTROINTESTINAL: No abdominal pain, nausea, vomiting or diarrhea, melena or bright red blood per rectum. GENITOURINARY: No urinary frequency, urgency, hesitancy or dysuria.  MUSCULOSKELETAL: No joint or muscle pain, no back pain, no recent trauma. DERMATOLOGIC: No rash, no itching, no lesions. ENDOCRINE: No polyuria, polydipsia, no heat or cold intolerance. No recent change in weight. HEMATOLOGICAL: No anemia or easy bruising or bleeding. NEUROLOGIC: No headache, seizures, numbness, tingling or weakness. PSYCHIATRIC: No depression, no loss of interest in normal activity or change in sleep pattern.     Exam:   BP 120/72   Ht 5' 3.75" (1.619 m)   Wt 152 lb (68.9 kg)   BMI 26.30 kg/m   Body mass index is 26.3 kg/m.  General appearance : Well developed well nourished female. No acute distress HEENT: Eyes: no retinal hemorrhage or exudates,  Neck supple, trachea midline, no carotid bruits, no thyroidmegaly Lungs: Clear to auscultation, no rhonchi or wheezes, or rib retractions  Heart: Regular rate and rhythm, no murmurs or gallops Breast:Examined in sitting and supine position were symmetrical in appearance, no palpable masses or tenderness,  no skin retraction, no nipple inversion, no nipple discharge, no skin discoloration, no axillary or supraclavicular lymphadenopathy Abdomen: no palpable masses or tenderness, no rebound or guarding Extremities: no edema or skin discoloration or tenderness  Pelvic: Vulva: Normal             Vagina: No gross lesions or discharge  Cervix: No gross lesions or discharge  Uterus  AV, normal size, shape and consistency, non-tender and mobile  Adnexa  Without masses or tenderness  Anus: Normal   Assessment/Plan:  84 y.o. female for annual exam   1. Well female exam with routine gynecological exam Normal gynecologic exam and menopause.  Pap test negative in February 2016.  No need to repeat.  Breast exam normal.  Last screening mammogram negative in August 2018.  Colonoscopy 2013.  Health labs with family physician.  2. Menopause present Well on no hormone replacement therapy.  No postmenopausal bleeding.  3. Age-related  osteoporosis without current pathological fracture GERD on Protonix.  Last bone density in March 2018 showed osteoporosis at the right femoral neck with a T score of -2.6.  Patient prefers not to start on medication.  Will continue with vitamin D supplements, calcium rich nutrition and weightbearing physical activity.  4. Mixed stress and urge urinary incontinence Importance of not over filling the bladder discussed.  Regular micturition recommended.  Avoiding excessive pelvic pressure recommended, treat cough and constipation.  Avoid heavy lifting.  Kegel exercises reviewed.  Refer to Urology for Urodynamic testing/management and PT.  Counseling on above issues and coordination of care more than 50% for 10 minutes.  Princess Bruins MD, 10:35 AM 07/22/2017  56

## 2017-07-24 ENCOUNTER — Encounter: Payer: Self-pay | Admitting: Obstetrics & Gynecology

## 2017-07-24 NOTE — Patient Instructions (Signed)
1. Well female exam with routine gynecological exam Normal gynecologic exam and menopause.  Pap test negative in February 2016.  No need to repeat.  Breast exam normal.  Last screening mammogram negative in August 2018.  Colonoscopy 2013.  Health labs with family physician.  2. Menopause present Well on no hormone replacement therapy.  No postmenopausal bleeding.  3. Age-related osteoporosis without current pathological fracture GERD on Protonix.  Last bone density in March 2018 showed osteoporosis at the right femoral neck with a T score of -2.6.  Patient prefers not to start on medication.  Will continue with vitamin D supplements, calcium rich nutrition and weightbearing physical activity.  4. Mixed stress and urge urinary incontinence Importance of not over filling the bladder discussed.  Regular micturition recommended.  Avoiding excessive pelvic pressure recommended, treat cough and constipation.  Avoid heavy lifting.  Kegel exercises reviewed.  Refer to Urology for Urodynamic testing/management and PT.  Sierra Bentley, it was a pleasure seeing you today!

## 2017-07-25 ENCOUNTER — Telehealth: Payer: Self-pay | Admitting: *Deleted

## 2017-07-25 NOTE — Telephone Encounter (Signed)
Referral faxed to [facility] they will call patient to schedule.

## 2017-07-25 NOTE — Telephone Encounter (Signed)
-----   Message from Princess Bruins, MD sent at 07/22/2017 10:50 AM EDT ----- Regarding: Urology referral Progressive severe Urinary Incontinence, probably mixed SUI and Urgency.  Urodynamic testing and Physical therapy.

## 2017-08-12 NOTE — Telephone Encounter (Signed)
Patient scheduled on 08/12/17 @ 9:45am with Dr.Dahlstedt

## 2017-08-15 ENCOUNTER — Other Ambulatory Visit: Payer: Self-pay | Admitting: *Deleted

## 2017-08-15 MED ORDER — HYDROCHLOROTHIAZIDE 12.5 MG PO CAPS: ORAL_CAPSULE | ORAL | 3 refills | Status: DC

## 2017-08-15 MED ORDER — LOSARTAN POTASSIUM 50 MG PO TABS: 25.0000 mg | ORAL_TABLET | Freq: Every day | ORAL | 3 refills | Status: DC

## 2017-08-26 ENCOUNTER — Telehealth: Payer: Self-pay | Admitting: Family Medicine

## 2017-08-26 NOTE — Telephone Encounter (Signed)
Both were already sent last month, also pt is requesting HCTZ not hydorcodone

## 2017-08-26 NOTE — Telephone Encounter (Signed)
Copied from [place] [id number]. Topic: Quick Communication - See Telephone Encounter >> Aug 26, 2017  1:41 PM Hewitt Shorts wrote: Pt is needing a refill on hydrocodone and losartan   [facility] [phone]   Best number [phone]

## 2017-08-29 ENCOUNTER — Telehealth: Payer: Self-pay | Admitting: Family Medicine

## 2017-08-29 NOTE — Telephone Encounter (Signed)
Copied from [place] [id number]. Topic: Quick Communication - Rx Refill/Question >> Aug 29, 2017 10:48 AM Cecelia Byars, NT wrote: Medication: losartan -hctz 50  /12.5 tablets  patient would like this combination  instead of the 2 separate pills ,the pharmacy stated this combination is in stock  Has the patient contacted their pharmacy? yes (Agent: If no, request that the patient contact the pharmacy for the refill. (Agent: If yes, when and what did the pharmacy advise?  Preferred Pharmacy (with phone number or street name [id number] [address], [place] [phone] (Phone) [phone] (Fax)       Agent: Please be advised that RX refills may take up to 3 business days. We ask that you follow-up with your pharmacy.

## 2017-09-01 MED ORDER — LOSARTAN POTASSIUM-HCTZ 50-12.5 MG PO TABS: 0.5000 | ORAL_TABLET | Freq: Every day | ORAL | 3 refills | Status: DC

## 2017-09-01 NOTE — Telephone Encounter (Signed)
I sent it to the pharmacy

## 2017-09-01 NOTE — Telephone Encounter (Signed)
Pharmacy called checking into the refill request; contact pharmacy

## 2017-09-15 ENCOUNTER — Emergency Department: Payer: Medicare Other

## 2017-09-15 ENCOUNTER — Other Ambulatory Visit: Payer: Self-pay

## 2017-09-15 ENCOUNTER — Emergency Department
Admission: EM | Admit: 2017-09-15 | Discharge: 2017-09-15 | Disposition: A | Discharge status: Home / Self Care | Payer: Medicare Other | Attending: Student in an Organized Health Care Education/Training Program | Admitting: Student in an Organized Health Care Education/Training Program

## 2017-09-15 ENCOUNTER — Encounter: Payer: Self-pay | Admitting: Emergency Medicine

## 2017-09-15 DIAGNOSIS — W010XXA Fall on same level from slipping, tripping and stumbling without subsequent striking against object, initial encounter: Secondary | ICD-10-CM | POA: Insufficient documentation

## 2017-09-15 DIAGNOSIS — Y92481 Parking lot as the place of occurrence of the external cause: Secondary | ICD-10-CM | POA: Diagnosis not present

## 2017-09-15 DIAGNOSIS — Y9389 Activity, other specified: Secondary | ICD-10-CM | POA: Diagnosis not present

## 2017-09-15 DIAGNOSIS — S5001XA Contusion of right elbow, initial encounter: Secondary | ICD-10-CM

## 2017-09-15 DIAGNOSIS — Z79899 Other long term (current) drug therapy: Secondary | ICD-10-CM | POA: Diagnosis not present

## 2017-09-15 DIAGNOSIS — S8991XA Unspecified injury of right lower leg, initial encounter: Secondary | ICD-10-CM | POA: Diagnosis present

## 2017-09-15 DIAGNOSIS — S20212A Contusion of left front wall of thorax, initial encounter: Secondary | ICD-10-CM | POA: Diagnosis not present

## 2017-09-15 DIAGNOSIS — I10 Essential (primary) hypertension: Secondary | ICD-10-CM | POA: Diagnosis not present

## 2017-09-15 DIAGNOSIS — W19XXXA Unspecified fall, initial encounter: Secondary | ICD-10-CM

## 2017-09-15 DIAGNOSIS — Y999 Unspecified external cause status: Secondary | ICD-10-CM | POA: Diagnosis not present

## 2017-09-15 DIAGNOSIS — S8001XA Contusion of right knee, initial encounter: Secondary | ICD-10-CM | POA: Insufficient documentation

## 2017-09-15 MED ORDER — TRAMADOL HCL 50 MG PO TABS: 50.0000 mg | ORAL_TABLET | Freq: Four times a day (QID) | ORAL | 0 refills | Status: DC | PRN

## 2017-09-15 MED ORDER — TRAMADOL HCL 50 MG PO TABS
50.0000 mg | ORAL_TABLET | Freq: Once | ORAL | Status: AC
  Administered 2017-09-15: 50 mg via ORAL
  Filled 2017-09-15: qty 1

## 2017-09-15 NOTE — ED Provider Notes (Signed)
[facility] Emergency Department Provider Note  ____________________________________________   First MD Initiated Contact with Patient 09/15/17 1850     (approximate)  I have reviewed the triage vital signs and the nursing notes.   HISTORY  Chief Complaint Knee Pain    HPI Sierra Bentley is a 84 y.o. female presents emergency department after a fall in the [facility] parking lot.  She states she is not sure why she fell but she thinks she tripped over her toe.  She is complaining of left rib, right knee, and right elbow pain.  She states she did not hit her head or lose consciousness.  She denies any blood thinner use.  She denies headache at this time.  She states she was here visiting her husband who had a massive heart attack in the last few days and is in the ICU.    Past Medical History:  Diagnosis Date  . Allergic rhinitis, cause unspecified   . Cramp of limb   . Diverticulosis   . Epistaxis   . GERD (gastroesophageal reflux disease)   . HTN (hypertension)   . Osteopenia   . Other and unspecified hyperlipidemia   . Other malaise and fatigue   . Rheumatoid arthritis(714.0)   . Urine incontinence     Patient Active Problem List   Diagnosis Date Noted  . Prediabetes 11/30/2016  . Skin lesion 05/17/2016  . Toenail fungus 10/03/2015  . At moderate risk for fall 09/05/2015  . Cervical spondylosis without myelopathy 08/05/2015  . Neck pain on left side 08/04/2015  . Routine general medical examination at a health care facility 08/27/2014  . Colon cancer screening 08/27/2014  . Fall against object 08/15/2014  . Encounter for Medicare annual wellness exam 07/25/2013  . Varicosities of leg 05/23/2012  . Skin lesion of right lower limb 05/23/2012  . Abnormal ultrasound of thyroid gland 03/01/2011  . Hyperlipidemia 06/11/2008  . ALLERGIC RHINITIS 03/29/2008  . Essential hypertension, benign 12/26/2006  . GERD 12/26/2006  . RHEUMATOID ARTHRITIS  12/26/2006  . Osteoporosis 12/26/2006  . URINARY INCONTINENCE 12/26/2006    Past Surgical History:  Procedure Laterality Date  . BREAST BIOPSY  2/11   fibrocystic change  . BREAST BIOPSY  8/11   fibrocystic change  . COLONOSCOPY  2003  . CYSTOSCOPY  2006  . MOLE REMOVAL     rectal  . TONSILLECTOMY      Prior to Admission medications   Medication Sig Start Date End Date Taking? Authorizing Provider  losartan-hydrochlorothiazide (HYZAAR) 50-12.5 MG tablet Take 0.5 tablets by mouth daily. 09/01/17   Tower, [name], MD  Multiple Vitamin (MULTIVITAMIN) tablet Take 1 tablet by mouth daily.      [provider]  pantoprazole (PROTONIX) 40 MG tablet Take 40 mg by mouth daily as needed.  12/15/13   [provider]  Polyethyl Glycol-Propyl Glycol (SYSTANE ULTRA OP) Place 1 drop into both eyes 4 (four) times daily.      [provider]  sodium chloride (MURO 128) 5 % ophthalmic ointment Place 1 drop into both eyes at bedtime.     [provider]  traMADol (ULTRAM) 50 MG tablet Take 1 tablet (50 mg total) by mouth every 6 (six) hours as needed. 09/15/17   Fisher, Linden Dolin, PA-C  TURMERIC PO Take 2 capsules by mouth daily before breakfast.    [provider]    Allergies Ace inhibitors; Alendronate sodium; Chocolate; Dust mite extract; and Ibandronate sodium  Family History  Problem Relation Age of Onset  . Heart attack Father 41  . Coronary artery disease Father   . Other Mother        Arrythmia  . Coronary artery disease Mother   . Hypertension Mother   . Anxiety disorder Mother   . Breast cancer Unknown        2nd cousin  . Depression Unknown        family history    Social History Social History   Tobacco Use  . Smoking status: Never Smoker  . Smokeless tobacco: Never Used  Substance Use Topics  . Alcohol use: No    Alcohol/week: 0.0 standard drinks  . Drug use: No    Review of Systems  Constitutional: No fever/chills Eyes:  No visual changes. ENT: No sore throat. Respiratory: Denies cough Genitourinary: Negative for dysuria. Musculoskeletal: Negative for back pain.  Positive for left rib, right elbow, and right knee pain Skin: Negative for rash.    ____________________________________________   PHYSICAL EXAM:  VITAL SIGNS: ED Triage Vitals  Enc Vitals Group     BP 09/15/17 1745 127/61     Pulse Rate 09/15/17 1745 81     Resp 09/15/17 1745 18     Temp 09/15/17 1745 97.8 F (36.6 C)     Temp Source 09/15/17 1745 Oral     SpO2 09/15/17 1745 96 %     Weight 09/15/17 1746 150 lb (68 kg)     Height 09/15/17 1746 5\' 4"  (1.626 m)     Head Circumference --      Peak Flow --      Pain Score 09/15/17 1745 6     Pain Loc --      Pain Edu? --      Excl. in [place]? --     Constitutional: Alert and oriented. Well appearing and in no acute distress. Eyes: Conjunctivae are normal.  Head: Atraumatic. Nose: No congestion/rhinnorhea. Mouth/Throat: Mucous membranes are moist.   Neck:  supple no lymphadenopathy noted Cardiovascular: Normal rate, regular rhythm. Heart sounds are normal Respiratory: Normal respiratory effort.  No retractions, lungs c t a, left ribs are minimally tender to palpation GU: deferred Musculoskeletal: FROM all extremities, warm and well perfused, the right knee is tender to palpation with some swelling noted, the right elbow is tender to palpation with some bruising noted.  No broken skin is noted on any of the extremities. Neurologic:  Normal speech and language.  Skin:  Skin is warm, dry and intact. No rash noted. Psychiatric: Mood and affect are normal. Speech and behavior are normal.  ____________________________________________   LABS (all labs ordered are listed, but only abnormal results are displayed)  Labs Reviewed - No data to display ____________________________________________   ____________________________________________  RADIOLOGY  X-ray of the left ribs is  negative, x-ray of the right knee is negative, x-ray of the right elbow is negative   ____________________________________________   PROCEDURES  Procedure(s) performed: Knee immobilizer was applied by the nursing staff  Procedures    ____________________________________________   INITIAL IMPRESSION / ASSESSMENT AND PLAN / ED COURSE  Pertinent labs & imaging results that were available during my care of the patient were reviewed by me and considered in my medical decision making (see chart for details).   Patient is an 84 year old female presents emergency department after falling in the parking lot of [facility].  She states she thinks she tripped over her toe.  She states she hurt her left  ribs, her right elbow, and right knee.  She did not hit her head or lose consciousness.  She is not complaining of any other injuries at this time.  On physical exam patient appears very well.  She is talkative and happy.  The left ribs are minimally tender, the right elbow is tender and bruised, the right knee is minimally swollen and tender to palpation.  No other abnormalities were noted at this time.  X-ray of the left ribs is negative for any acute fractures.  X-ray of the right knee is negative for any acute abnormality, x-ray of the right elbow is negative for any acute abnormality.  Discussed the findings with the patient and her son.  The patient was placed in a knee immobilizer to help decrease the swelling.  She was given a dose of tramadol 50 mg p.o. while here in the ED.  She is to take Tylenol and ibuprofen for pain as needed.  If the pain is uncontrolled by the Tylenol or ibuprofen she was also given a prescription for tramadol.  She is to apply ice to all areas that hurt.  Return to the emergency department if worsening.  She states she understands will comply.  She was discharged in stable condition     As part of my medical decision making, I reviewed the following data within the  [place] History obtained from family, Nursing notes reviewed and incorporated, Old chart reviewed, Radiograph reviewed x-ray left ribs is negative, x-ray of the right elbow is negative, x-ray of the right knee is negative, Notes from prior ED visits and [place] Controlled Substance Database  ____________________________________________   FINAL CLINICAL IMPRESSION(S) / ED DIAGNOSES  Final diagnoses:  Fall, initial encounter  Contusion of right knee, initial encounter  Contusion of rib on left side, initial encounter  Contusion of right elbow, initial encounter      NEW MEDICATIONS STARTED DURING THIS VISIT:  Discharge Medication List as of 09/15/2017  8:12 PM    START taking these medications   Details  traMADol (ULTRAM) 50 MG tablet Take 1 tablet (50 mg total) by mouth every 6 (six) hours as needed., Starting Thu 09/15/2017, Normal         Note:  This document was prepared using Dragon voice recognition software and may include unintentional dictation errors.    Versie Starks, PA-C 09/15/17 2135    Merlyn Lot, MD 09/15/17 [phone]

## 2017-09-15 NOTE — ED Triage Notes (Signed)
Pt presents to ED via POV with c/o of right knee and right elbow pain after fall. Pt states she tripped over feet. Pt denies any LOC or hitting head. Pt in NAD at this time. Pt denies headache or use of blood thinners.

## 2017-09-15 NOTE — Discharge Instructions (Addendum)
Follow-up with regular doctor if not better in 5 7 days.  Wear the knee immobilizer for comfort when walking.  Apply ice to all areas that hurt.  Take Tylenol or ibuprofen as needed for pain.  You may take tramadol if the pain is not controlled by the ibuprofen and Tylenol.

## 2017-09-15 NOTE — ED Notes (Signed)
See triage note   Having pain to right knee and elbow  States she tripped over her feet  Denies any LOC  Did not hit her head

## 2017-09-28 ENCOUNTER — Encounter: Payer: Self-pay | Admitting: Family Medicine

## 2017-09-28 ENCOUNTER — Ambulatory Visit: Payer: Medicare Other | Admitting: Family Medicine

## 2017-09-28 ENCOUNTER — Ambulatory Visit: Payer: Self-pay | Admitting: *Deleted

## 2017-09-28 VITALS — BP 134/68 | HR 86 | Temp 98.0°F | Ht 64.0 in | Wt 154.2 lb

## 2017-09-28 DIAGNOSIS — M069 Rheumatoid arthritis, unspecified: Secondary | ICD-10-CM

## 2017-09-28 DIAGNOSIS — I83893 Varicose veins of bilateral lower extremities with other complications: Secondary | ICD-10-CM | POA: Diagnosis not present

## 2017-09-28 DIAGNOSIS — W19XXXA Unspecified fall, initial encounter: Secondary | ICD-10-CM | POA: Insufficient documentation

## 2017-09-28 DIAGNOSIS — I10 Essential (primary) hypertension: Secondary | ICD-10-CM

## 2017-09-28 DIAGNOSIS — R600 Localized edema: Secondary | ICD-10-CM | POA: Diagnosis not present

## 2017-09-28 DIAGNOSIS — W19XXXS Unspecified fall, sequela: Secondary | ICD-10-CM

## 2017-09-28 DIAGNOSIS — Y92009 Unspecified place in unspecified non-institutional (private) residence as the place of occurrence of the external cause: Secondary | ICD-10-CM

## 2017-09-28 MED ORDER — FUROSEMIDE 20 MG PO TABS: ORAL_TABLET | ORAL | 0 refills | Status: DC

## 2017-09-28 NOTE — Assessment & Plan Note (Signed)
No changes clinically  

## 2017-09-28 NOTE — Progress Notes (Signed)
Subjective:    Patient ID: Sierra Bentley, female    DOB: Nov 01, 1933, 84 y.o.   MRN: [id number]  HPI  Here for swelling of ankles and feet   Had a fall at the house  [place] forward-on R knee and elbow  Put a brace on her R leg to help heal knee contusion - it went to her thigh  This is her bad knee - trying to avoid surgery  Much better now   Took the brace off about 10 days ago  Swelling started a bit later A little rash and funny feeling in R ankle (warm feeling)  Both legs are swollen the same   Has been eating more processed food/junk with sodium   Husband was in hospital with MI unfortunately Was sitting with him (her feet were down)  Now he is in rehab -doing better    Wt Readings from Last 3 Encounters:  09/28/17 154 lb 4 oz (70 kg)  09/15/17 150 lb (68 kg)  07/22/17 152 lb (68.9 kg)   26.48 kg/m   BP Readings from Last 3 Encounters:  09/28/17 134/68  09/15/17 (!) 150/68  07/22/17 120/72   Has varicose veins   Pulse Readings from Last 3 Encounters:  09/28/17 86  09/15/17 81  12/08/16 86    Takes losartan-hct 50-12.5 mg daily  occ slight pressure   No CP or shortness of breath   Some nasal congestion when she bends over   No nsaids    Lab Results  Component Value Date   CREATININE 0.68 11/26/2016   BUN 16 11/26/2016   NA 141 11/26/2016   K 5.0 11/26/2016   CL 102 11/26/2016   CO2 34 (H) 11/26/2016   Lab Results  Component Value Date   ALT 13 11/26/2016   AST 14 11/26/2016   ALKPHOS 50 11/26/2016   BILITOT 0.5 11/26/2016     Patient Active Problem List   Diagnosis Date Noted  . Pedal edema 09/28/2017  . Prediabetes 11/30/2016  . Skin lesion 05/17/2016  . Toenail fungus 10/03/2015  . At moderate risk for fall 09/05/2015  . Cervical spondylosis without myelopathy 08/05/2015  . Neck pain on left side 08/04/2015  . Routine general medical examination at a health care facility 08/27/2014  . Colon cancer screening 08/27/2014  . Fall  against object 08/15/2014  . Encounter for Medicare annual wellness exam 07/25/2013  . Varicosities of leg 05/23/2012  . Skin lesion of right lower limb 05/23/2012  . Abnormal ultrasound of thyroid gland 03/01/2011  . Hyperlipidemia 06/11/2008  . ALLERGIC RHINITIS 03/29/2008  . Essential hypertension, benign 12/26/2006  . GERD 12/26/2006  . RHEUMATOID ARTHRITIS 12/26/2006  . Osteoporosis 12/26/2006  . URINARY INCONTINENCE 12/26/2006   Past Medical History:  Diagnosis Date  . Allergic rhinitis, cause unspecified   . Cramp of limb   . Diverticulosis   . Epistaxis   . GERD (gastroesophageal reflux disease)   . HTN (hypertension)   . Osteopenia   . Other and unspecified hyperlipidemia   . Other malaise and fatigue   . Rheumatoid arthritis(714.0)   . Urine incontinence    Past Surgical History:  Procedure Laterality Date  . BREAST BIOPSY  2/11   fibrocystic change  . BREAST BIOPSY  8/11   fibrocystic change  . COLONOSCOPY  2003  . CYSTOSCOPY  2006  . MOLE REMOVAL     rectal  . TONSILLECTOMY     Social History   Tobacco Use  .  Smoking status: Never Smoker  . Smokeless tobacco: Never Used  Substance Use Topics  . Alcohol use: No    Alcohol/week: 0.0 standard drinks  . Drug use: No   Family History  Problem Relation Age of Onset  . Heart attack Father 9  . Coronary artery disease Father   . Other Mother        Arrythmia  . Coronary artery disease Mother   . Hypertension Mother   . Anxiety disorder Mother   . Breast cancer Unknown        2nd cousin  . Depression Unknown        family history   Allergies  Allergen Reactions  . Ace Inhibitors     REACTION: cough  . Alendronate Sodium     REACTION: reflux, trouble swallowing  . Chocolate   . Dust Mite Extract   . Ibandronate Sodium     REACTION: reflux and increased BP   Current Outpatient Medications on File Prior to Visit  Medication Sig Dispense Refill  . losartan-hydrochlorothiazide (HYZAAR)  50-12.5 MG tablet Take 0.5 tablets by mouth daily. 45 tablet 3  . Multiple Vitamin (MULTIVITAMIN) tablet Take 1 tablet by mouth daily.      . pantoprazole (PROTONIX) 40 MG tablet Take 40 mg by mouth daily as needed.   11  . Polyethyl Glycol-Propyl Glycol (SYSTANE ULTRA OP) Place 1 drop into both eyes 4 (four) times daily.      . sodium chloride (MURO 128) 5 % ophthalmic ointment Place 1 drop into both eyes at bedtime.     . traMADol (ULTRAM) 50 MG tablet Take 1 tablet (50 mg total) by mouth every 6 (six) hours as needed. 15 tablet 0  . TURMERIC PO Take 2 capsules by mouth daily before breakfast.     No current facility-administered medications on file prior to visit.     Review of Systems  Constitutional: Negative for activity change, appetite change, fatigue, fever and unexpected weight change.  HENT: Negative for congestion, ear pain, rhinorrhea, sinus pressure and sore throat.   Eyes: Negative for pain, redness and visual disturbance.  Respiratory: Negative for cough, shortness of breath and wheezing.   Cardiovascular: Positive for leg swelling. Negative for chest pain and palpitations.  Gastrointestinal: Negative for abdominal pain, blood in stool, constipation and diarrhea.  Endocrine: Negative for polydipsia and polyuria.  Genitourinary: Negative for dysuria, frequency and urgency.  Musculoskeletal: Negative for arthralgias, back pain and myalgias.  Skin: Positive for rash. Negative for pallor.  Allergic/Immunologic: Negative for environmental allergies.  Neurological: Negative for dizziness, syncope and headaches.  Hematological: Negative for adenopathy. Does not bruise/bleed easily.  Psychiatric/Behavioral: Negative for decreased concentration and dysphoric mood. The patient is not nervous/anxious.        Objective:   Physical Exam  Constitutional: She appears well-developed and well-nourished. No distress.  Well appearing   HENT:  Head: Normocephalic and atraumatic.  Eyes:  Pupils are equal, round, and reactive to light. Conjunctivae and EOM are normal. No scleral icterus.  Neck: Normal range of motion. Neck supple.  Cardiovascular: Normal rate, regular rhythm and normal heart sounds.  LE varicosities w/o ulceration   Pulmonary/Chest: Effort normal and breath sounds normal. No respiratory distress. She has no wheezes.  No crackles   Abdominal: Soft. Bowel sounds are normal. She exhibits no distension.  Musculoskeletal: She exhibits edema.  Poor rom of L knee   Plus one non pitting edema of both legs from just above ankle to mid  foot  Shoe line noted  No tenderness  Area or rash on R inner ankle consistent with venous stasis dermatitis No ulceration   No palp cords No tenderness  Neg homans sign  Lymphadenopathy:    She has no cervical adenopathy.  Neurological: She is alert. She displays normal reflexes. She exhibits normal muscle tone. Coordination normal.  Skin: Skin is warm and dry. Rash noted. No erythema.  Mild venous stasis dermatitis on R leg   Psychiatric: She has a normal mood and affect.          Assessment & Plan:   Problem List Items Addressed This Visit      Cardiovascular and Mediastinum   Essential hypertension, benign    bp in fair control at this time  BP Readings from Last 1 Encounters:  09/28/17 134/68   No changes needed Most recent labs reviewed  Disc lifstyle change with low sodium diet and exercise        Relevant Medications   furosemide (LASIX) 20 MG tablet   Varicosities of leg    Some edema and venous stasis dermatitis (on R leg)  Enc strongly to elevate it and wear supp hose      Relevant Medications   furosemide (LASIX) 20 MG tablet     Musculoskeletal and Integument   Rheumatoid arthritis (HCC)    No changes clinically        Other   Fall at home    Another fall onto R knee and elbow  Internal derangement of knee /no fractures- recently came out of brace Overall poor rom and gait is  affected Disc fall/ high fall risk with poor balance (pt was also distracted) Fall precautions at home Urged strongly to use walker instead of cane for better stability      Pedal edema - Primary    Bilateral with venous insufficiency after recent sitting with husband in hospital/ high na diet and sleeping in a chair  Reassuring exam and no cardiac red flags Disc imp of supp hose daily  Leg elevation Needs to sleep with feet at heart level  Lasix 20 mg daily for 5 d- watch for hypotension F/u 1-2 wk for re check  Update if not starting to improve in a week or if worsening

## 2017-09-28 NOTE — Telephone Encounter (Signed)
Pt reports swelling of both legs, onset 2 weeks ago. States edema at feet, ankles and "A little bit up my leg, above the ankles."  States noted swelling after fall 09/15/17, seen in ED; swelling gradually worsened. States right ankle with rash. Edema is non-pitting. Denies pain/tenderness, SOB, afebrile.States right ankle "May be a little warm." Also reports intermittent lightheadedness,positional, and mild intermittent nausea.  States she has not been able to keep legs elevated as she is caring for her husband. Appt made with Dr. Glori Bickers for 1500 today by agent prior to NT.  Care advise given per protocol.  Reason for Disposition . [1] MODERATE leg swelling (e.g., swelling extends up to knees) AND [2] new onset or worsening  Answer Assessment - Initial Assessment Questions 1. ONSET: "When did the swelling start?" (e.g., minutes, hours, days)     2 weeks ago 2. LOCATION: "What part of the leg is swollen?"  "Are both legs swollen or just one leg?"     Both legs; feet, ankles 3. SEVERITY: "How bad is the swelling?" (e.g., localized; mild, moderate, severe)  - Localized - small area of swelling localized to one leg  - MILD pedal edema - swelling limited to foot and ankle, pitting edema < 1/4 inch (6 mm) deep, rest and elevation eliminate most or all swelling  - MODERATE edema - swelling of lower leg to knee, pitting edema > 1/4 inch (6 mm) deep, rest and elevation only partially reduce swelling  - SEVERE edema - swelling extends above knee, facial or hand swelling present      Moderate 4. REDNESS: "Does the swelling look red or infected?"     "Little bit warm, right ankle only. 5. PAIN: "Is the swelling painful to touch?" If so, ask: "How painful is it?"   (Scale 1-10; mild, moderate or severe)     No 6. FEVER: "Do you have a fever?" If so, ask: "What is it, how was it measured, and when did it start?"      No 7. CAUSE: "What do you think is causing the leg swelling?"     Unsure 8. MEDICAL HISTORY:  "Do you have a history of heart failure, kidney disease, liver failure, or cancer?"     no 9. RECURRENT SYMPTOM: "Have you had leg swelling before?" If so, ask: "When was the last time?" "What happened that time?"     "Happened after fall 09/15/17" 10. OTHER SYMPTOMS: "Do you have any other symptoms?" (e.g., chest pain, difficulty breathing)       Mild nausea on occasion, lightheaded at times  Protocols used: LEG SWELLING AND EDEMA-A-AH

## 2017-09-28 NOTE — Patient Instructions (Addendum)
When sitting or sleeping - get your feet up as much as possible  Optimally at the level of your heart   Keep cool Try to wear your support hose if you can   If you feel off balance-use a walker instead of a cane to prevent falls   Drink more water Avoid processed food with a lot of sodium   Try lasix for 5 days to get some of your fluid off  It will make you urinate more   Follow up with [place] in 1-2 weeks to re check swelling   If swelling or other symptoms worsen in the meantime please call us

## 2017-09-28 NOTE — Assessment & Plan Note (Signed)
Some edema and venous stasis dermatitis (on R leg)  Enc strongly to elevate it and wear supp hose

## 2017-09-28 NOTE — Assessment & Plan Note (Signed)
bp in fair control at this time  BP Readings from Last 1 Encounters:  09/28/17 134/68   No changes needed Most recent labs reviewed  Disc lifstyle change with low sodium diet and exercise

## 2017-09-28 NOTE — Assessment & Plan Note (Signed)
Another fall onto R knee and elbow  Internal derangement of knee /no fractures- recently came out of brace Overall poor rom and gait is affected Disc fall/ high fall risk with poor balance (pt was also distracted) Fall precautions at home Urged strongly to use walker instead of cane for better stability

## 2017-09-28 NOTE — Telephone Encounter (Signed)
I will see her then  

## 2017-09-28 NOTE — Telephone Encounter (Signed)
Pt has appt with Dr Glori Bickers 09/28/17 at 3:00 pm.

## 2017-09-28 NOTE — Assessment & Plan Note (Signed)
Bilateral with venous insufficiency after recent sitting with husband in hospital/ high na diet and sleeping in a chair  Reassuring exam and no cardiac red flags Disc imp of supp hose daily  Leg elevation Needs to sleep with feet at heart level  Lasix 20 mg daily for 5 d- watch for hypotension F/u 1-2 wk for re check  Update if not starting to improve in a week or if worsening

## 2017-10-07 ENCOUNTER — Ambulatory Visit: Payer: Medicare Other | Admitting: Family Medicine

## 2017-10-07 ENCOUNTER — Encounter: Payer: Self-pay | Admitting: Family Medicine

## 2017-10-07 VITALS — BP 124/68 | HR 82 | Temp 98.2°F | Ht 64.0 in | Wt 151.0 lb

## 2017-10-07 DIAGNOSIS — R202 Paresthesia of skin: Secondary | ICD-10-CM | POA: Diagnosis not present

## 2017-10-07 DIAGNOSIS — I10 Essential (primary) hypertension: Secondary | ICD-10-CM | POA: Diagnosis not present

## 2017-10-07 DIAGNOSIS — R600 Localized edema: Secondary | ICD-10-CM | POA: Diagnosis not present

## 2017-10-07 NOTE — Progress Notes (Signed)
Subjective:    Patient ID: Sierra Bentley, female    DOB: [birth year], 84 y.o.   MRN: [id number]  HPI Here for f/u of pedal edema   Last visit enc her to elevated legs/wear support stockings  Px furosemide 20 mg daily for 5 d watching bp  Enc lower sodium intake   Wt Readings from Last 3 Encounters:  10/07/17 151 lb (68.5 kg)  09/28/17 154 lb 4 oz (70 kg)  09/15/17 150 lb (68 kg)  lost 3 lb of fluid  25.92 kg/m   bp is stable today  No cp or palpitations or headaches or edema  No side effects to medicines  BP Readings from Last 3 Encounters:  10/07/17 124/68  09/28/17 134/68  09/15/17 (!) 150/68    She takes losartan hct   Swelling has gone down   She has tingling in her R foot -worse with sitting  Used to happen occasionally-now more consistent  Did ok with 5 d of lasix   Is wearing support socks  Also elevating legs   Things are improving at home  Husband will likely come home from rehab   Patient Active Problem List   Diagnosis Date Noted  . Tingling of both feet 10/07/2017  . Pedal edema 09/28/2017  . Fall at home 09/28/2017  . Prediabetes 11/30/2016  . Skin lesion 05/17/2016  . Toenail fungus 10/03/2015  . At moderate risk for fall 09/05/2015  . Cervical spondylosis without myelopathy 08/05/2015  . Neck pain on left side 08/04/2015  . Routine general medical examination at a health care facility 08/27/2014  . Colon cancer screening 08/27/2014  . Fall against object 08/15/2014  . Encounter for Medicare annual wellness exam 07/25/2013  . Varicosities of leg 05/23/2012  . Skin lesion of right lower limb 05/23/2012  . Abnormal ultrasound of thyroid gland 03/01/2011  . Hyperlipidemia 06/11/2008  . ALLERGIC RHINITIS 03/29/2008  . Essential hypertension, benign 12/26/2006  . GERD 12/26/2006  . Rheumatoid arthritis ([place]) 12/26/2006  . Osteoporosis 12/26/2006  . URINARY INCONTINENCE 12/26/2006   Past Medical History:  Diagnosis Date  . Allergic  rhinitis, cause unspecified   . Cramp of limb   . Diverticulosis   . Epistaxis   . GERD (gastroesophageal reflux disease)   . HTN (hypertension)   . Osteopenia   . Other and unspecified hyperlipidemia   . Other malaise and fatigue   . Rheumatoid arthritis(714.0)   . Urine incontinence    Past Surgical History:  Procedure Laterality Date  . BREAST BIOPSY  2/11   fibrocystic change  . BREAST BIOPSY  8/11   fibrocystic change  . COLONOSCOPY  2003  . CYSTOSCOPY  2006  . MOLE REMOVAL     rectal  . TONSILLECTOMY     Social History   Tobacco Use  . Smoking status: Never Smoker  . Smokeless tobacco: Never Used  Substance Use Topics  . Alcohol use: No    Alcohol/week: 0.0 standard drinks  . Drug use: No   Family History  Problem Relation Age of Onset  . Heart attack Father 13  . Coronary artery disease Father   . Other Mother        Arrythmia  . Coronary artery disease Mother   . Hypertension Mother   . Anxiety disorder Mother   . Breast cancer Unknown        2nd cousin  . Depression Unknown        family history   Allergies  Allergen Reactions  . Ace Inhibitors     REACTION: cough  . Alendronate Sodium     REACTION: reflux, trouble swallowing  . Chocolate   . Dust Mite Extract   . Ibandronate Sodium     REACTION: reflux and increased BP   Current Outpatient Medications on File Prior to Visit  Medication Sig Dispense Refill  . furosemide (LASIX) 20 MG tablet Take one pill by mouth daily for 5 days for swelling 10 tablet 0  . losartan-hydrochlorothiazide (HYZAAR) 50-12.5 MG tablet Take 0.5 tablets by mouth daily. 45 tablet 3  . Multiple Vitamin (MULTIVITAMIN) tablet Take 1 tablet by mouth daily.      . pantoprazole (PROTONIX) 40 MG tablet Take 40 mg by mouth daily as needed.   11  . Polyethyl Glycol-Propyl Glycol (SYSTANE ULTRA OP) Place 1 drop into both eyes 4 (four) times daily.      . sodium chloride (MURO 128) 5 % ophthalmic ointment Place 1 drop into both  eyes at bedtime.     . traMADol (ULTRAM) 50 MG tablet Take 1 tablet (50 mg total) by mouth every 6 (six) hours as needed. 15 tablet 0  . TURMERIC PO Take 2 capsules by mouth daily before breakfast.     No current facility-administered medications on file prior to visit.     Review of Systems  Constitutional: Negative for activity change, appetite change, fatigue, fever and unexpected weight change.  HENT: Negative for congestion, ear pain, rhinorrhea, sinus pressure and sore throat.   Eyes: Negative for pain, redness and visual disturbance.  Respiratory: Negative for cough, shortness of breath and wheezing.   Cardiovascular: Positive for leg swelling. Negative for chest pain and palpitations.       Much improved   Also varicosities  No symptoms of claudication   Gastrointestinal: Negative for abdominal pain, blood in stool, constipation and diarrhea.  Endocrine: Negative for polydipsia and polyuria.  Genitourinary: Negative for dysuria, frequency and urgency.  Musculoskeletal: Negative for arthralgias, back pain and myalgias.  Skin: Negative for pallor and rash.  Allergic/Immunologic: Negative for environmental allergies.  Neurological: Negative for dizziness, syncope and headaches.       Tingling of feet-mostly on R (like it is asleep)   Hematological: Negative for adenopathy. Does not bruise/bleed easily.  Psychiatric/Behavioral: Negative for decreased concentration and dysphoric mood. The patient is not nervous/anxious.        Objective:   Physical Exam  Constitutional: She appears well-developed and well-nourished. No distress.  Well appearing   HENT:  Head: Normocephalic and atraumatic.  Mouth/Throat: Oropharynx is clear and moist.  Eyes: Pupils are equal, round, and reactive to light. Conjunctivae and EOM are normal.  Neck: Normal range of motion. Neck supple. No JVD present. Carotid bruit is not present. No thyromegaly present.  Cardiovascular: Normal rate, regular  rhythm, normal heart sounds and intact distal pulses. Exam reveals no gallop.  Very slight pedal pulses palpable Feet are warm   Pulmonary/Chest: Effort normal and breath sounds normal. No stridor. No respiratory distress. She has no wheezes. She has no rales.  No crackles  Abdominal: Soft. Bowel sounds are normal. She exhibits no distension, no abdominal bruit and no mass. There is no tenderness.  Musculoskeletal: She exhibits no edema, tenderness or deformity.  Pedal edema is much improved to resolved  Varicosities present    Lymphadenopathy:    She has no cervical adenopathy.  Neurological: She is alert. She has normal reflexes. She displays normal reflexes. No cranial  nerve deficit. Coordination normal.  Skin: Skin is warm and dry. No rash noted. No erythema. No pallor.  Psychiatric: She has a normal mood and affect.          Assessment & Plan:   Problem List Items Addressed This Visit      Cardiovascular and Mediastinum   Essential hypertension, benign    bp in fair control at this time  BP Readings from Last 1 Encounters:  10/07/17 124/68   No changes needed Most recent labs reviewed  Disc lifstyle change with low sodium diet and exercise  Tolerated 5 d of lasix for edema  Lab today for bmp        Other   Pedal edema - Primary    Improved with lower sodium diet/supp hose/elevation and 5d of furosemide  Lab today  Alert if edema comes back- will keep furosemide on list for prn use       Relevant Orders   Basic metabolic panel (Completed)   Tingling of both feet    Much more on R- usually positional  She is worried about circulation  Pulses are slightly palpable and feet are warm  Will order ABIs  She does have varicosities/venous insuff as well- doubt this is cause of symptoms  Nl sens on exam today      Relevant Orders   VAS [place] LE ART SEG MULTI (Segm&LE [place])

## 2017-10-07 NOTE — Patient Instructions (Signed)
Keep elevating feet and wearing support stockings Keep watching diet for sodium  Keep drinking water  If swelling comes back off lasix let me know   Labs today   We will schedule a circulation test as well

## 2017-10-08 LAB — BASIC METABOLIC PANEL
BUN: 17 mg/dL (ref 7–25)
CHLORIDE: 101 mmol/L (ref 98–110)
CO2: 30 mmol/L (ref 20–32)
CREATININE: 0.79 mg/dL (ref 0.60–0.88)
Calcium: 9.7 mg/dL (ref 8.6–10.4)
Glucose, Bld: 137 mg/dL — ABNORMAL HIGH (ref 65–99)
POTASSIUM: 3.5 mmol/L (ref 3.5–5.3)
Sodium: 140 mmol/L (ref 135–146)

## 2017-10-08 NOTE — Assessment & Plan Note (Signed)
Much more on R- usually positional  She is worried about circulation  Pulses are slightly palpable and feet are warm  Will order ABIs  She does have varicosities/venous insuff as well- doubt this is cause of symptoms  Nl sens on exam today

## 2017-10-08 NOTE — Assessment & Plan Note (Signed)
bp in fair control at this time  BP Readings from Last 1 Encounters:  10/07/17 124/68   No changes needed Most recent labs reviewed  Disc lifstyle change with low sodium diet and exercise  Tolerated 5 d of lasix for edema  Lab today for bmp

## 2017-10-08 NOTE — Assessment & Plan Note (Signed)
Improved with lower sodium diet/supp hose/elevation and 5d of furosemide  Lab today  Alert if edema comes back- will keep furosemide on list for prn use

## 2017-10-10 ENCOUNTER — Encounter: Payer: Self-pay | Admitting: *Deleted

## 2017-11-02 ENCOUNTER — Ambulatory Visit (INDEPENDENT_AMBULATORY_CARE_PROVIDER_SITE_OTHER): Payer: Medicare Other

## 2017-11-02 DIAGNOSIS — R202 Paresthesia of skin: Secondary | ICD-10-CM

## 2017-11-28 ENCOUNTER — Telehealth: Payer: Self-pay | Admitting: Family Medicine

## 2017-11-28 DIAGNOSIS — M810 Age-related osteoporosis without current pathological fracture: Secondary | ICD-10-CM

## 2017-11-28 DIAGNOSIS — Z0000 Encounter for general adult medical examination without abnormal findings: Secondary | ICD-10-CM

## 2017-11-28 DIAGNOSIS — R7303 Prediabetes: Secondary | ICD-10-CM

## 2017-11-28 DIAGNOSIS — E7800 Pure hypercholesterolemia, unspecified: Secondary | ICD-10-CM

## 2017-11-28 DIAGNOSIS — I10 Essential (primary) hypertension: Secondary | ICD-10-CM

## 2017-11-28 NOTE — Telephone Encounter (Signed)
-----   Message from Eustace Pen, LPN sent at [phone]/[id number] 11:06 AM EST ----- Regarding: Labs 11/5 Lab orders needed. Thank you.  Insurance:  [facility] Medicare

## 2017-11-29 ENCOUNTER — Ambulatory Visit (INDEPENDENT_AMBULATORY_CARE_PROVIDER_SITE_OTHER): Payer: Medicare Other

## 2017-11-29 ENCOUNTER — Ambulatory Visit: Payer: Medicare Other

## 2017-11-29 VITALS — BP 124/78 | HR 82 | Temp 97.7°F | Ht 65.0 in | Wt 151.8 lb

## 2017-11-29 DIAGNOSIS — R6889 Other general symptoms and signs: Secondary | ICD-10-CM

## 2017-11-29 DIAGNOSIS — M810 Age-related osteoporosis without current pathological fracture: Secondary | ICD-10-CM

## 2017-11-29 DIAGNOSIS — R7303 Prediabetes: Secondary | ICD-10-CM | POA: Diagnosis not present

## 2017-11-29 DIAGNOSIS — Z0000 Encounter for general adult medical examination without abnormal findings: Secondary | ICD-10-CM | POA: Diagnosis not present

## 2017-11-29 DIAGNOSIS — E7800 Pure hypercholesterolemia, unspecified: Secondary | ICD-10-CM | POA: Diagnosis not present

## 2017-11-29 DIAGNOSIS — I10 Essential (primary) hypertension: Secondary | ICD-10-CM

## 2017-11-29 LAB — HEMOGLOBIN A1C: HEMOGLOBIN A1C: 5.8 % (ref 4.6–6.5)

## 2017-11-29 LAB — CBC WITH DIFFERENTIAL/PLATELET
BASOS PCT: 1.2 % (ref 0.0–3.0)
Basophils Absolute: 0.1 10*3/uL (ref 0.0–0.1)
EOS PCT: 2.2 % (ref 0.0–5.0)
Eosinophils Absolute: 0.1 10*3/uL (ref 0.0–0.7)
HCT: 39.7 % (ref 36.0–46.0)
Hemoglobin: 13.2 g/dL (ref 12.0–15.0)
LYMPHS ABS: 1.7 10*3/uL (ref 0.7–4.0)
Lymphocytes Relative: 29.9 % (ref 12.0–46.0)
MCHC: 33.2 g/dL (ref 30.0–36.0)
MCV: 83.4 fl (ref 78.0–100.0)
MONO ABS: 0.4 10*3/uL (ref 0.1–1.0)
Monocytes Relative: 7.1 % (ref 3.0–12.0)
NEUTROS PCT: 59.6 % (ref 43.0–77.0)
Neutro Abs: 3.4 10*3/uL (ref 1.4–7.7)
Platelets: 256 10*3/uL (ref 150.0–400.0)
RBC: 4.76 Mil/uL (ref 3.87–5.11)
RDW: 14 % (ref 11.5–15.5)
WBC: 5.7 10*3/uL (ref 4.0–10.5)

## 2017-11-29 LAB — COMPREHENSIVE METABOLIC PANEL
ALT: 15 U/L (ref 0–35)
AST: 17 U/L (ref 0–37)
Albumin: 4.3 g/dL (ref 3.5–5.2)
Alkaline Phosphatase: 63 U/L (ref 39–117)
BUN: 14 mg/dL (ref 6–23)
CHLORIDE: 102 meq/L (ref 96–112)
CO2: 32 mEq/L (ref 19–32)
Calcium: 9.9 mg/dL (ref 8.4–10.5)
Creatinine, Ser: 0.69 mg/dL (ref 0.40–1.20)
GFR: 86.58 mL/min (ref >60.00)
GLUCOSE: 97 mg/dL (ref 70–99)
POTASSIUM: 4.3 meq/L (ref 3.5–5.1)
SODIUM: 139 meq/L (ref 135–145)
TOTAL PROTEIN: 6.9 g/dL (ref 6.0–8.3)
Total Bilirubin: 0.5 mg/dL (ref 0.2–1.2)

## 2017-11-29 LAB — LIPID PANEL
CHOL/HDL RATIO: 4
CHOLESTEROL: 211 mg/dL — AB (ref 0–200)
HDL: 50.9 mg/dL (ref >39.00)
LDL CALC: 141 mg/dL — AB (ref 0–99)
NONHDL: 160.14
Triglycerides: 96 mg/dL (ref 0.0–149.0)
VLDL: 19.2 mg/dL (ref 0.0–40.0)

## 2017-11-29 LAB — TSH: TSH: 1.69 u[IU]/mL (ref 0.35–4.50)

## 2017-11-29 LAB — VITAMIN B12: VITAMIN B 12: 499 pg/mL (ref 211–911)

## 2017-11-29 LAB — VITAMIN D 25 HYDROXY (VIT D DEFICIENCY, FRACTURES): VITD: 34.49 ng/mL (ref 30.00–100.00)

## 2017-11-29 NOTE — Patient Instructions (Signed)
Sierra Bentley , Thank you for taking time to come for your Medicare Wellness Visit. I appreciate your ongoing commitment to your health goals. Please review the following plan we discussed and let me know if I can assist you in the future.   These are the goals we discussed: Goals    . Increase water intake     Starting 11/29/2017, I will attempt to drink at least 6-8 glasses of water daily.        This is a list of the screening recommended for you and due dates:  Health Maintenance  Topic Date Due  . Flu Shot  05/27/2018*  . Mammogram  11/01/2018  . Tetanus Vaccine  05/18/2026  . DEXA scan (bone density measurement)  Completed  . Pneumonia vaccines  Completed  *Topic was postponed. The date shown is not the original due date.   Preventive Care for Adults  A healthy lifestyle and preventive care can promote health and wellness. Preventive health guidelines for adults include the following key practices.  . A routine yearly physical is a good way to check with your health care provider about your health and preventive screening. It is a chance to share any concerns and updates on your health and to receive a thorough exam.  . Visit your dentist for a routine exam and preventive care every 6 months. Brush your teeth twice a day and floss once a day. Good oral hygiene prevents tooth decay and gum disease.  . The frequency of eye exams is based on your age, health, family medical history, use  of contact lenses, and other factors. Follow your health care provider's recommendations for frequency of eye exams.  . Eat a healthy diet. Foods like vegetables, fruits, whole grains, low-fat dairy products, and lean protein foods contain the nutrients you need without too many calories. Decrease your intake of foods high in solid fats, added sugars, and salt. Eat the right amount of calories for you. Get information about a proper diet from your health care provider, if necessary.  . Regular  physical exercise is one of the most important things you can do for your health. Most adults should get at least 150 minutes of moderate-intensity exercise (any activity that increases your heart rate and causes you to sweat) each week. In addition, most adults need muscle-strengthening exercises on 2 or more days a week.  Silver Sneakers may be a benefit available to you. To determine eligibility, you may visit the website: www.silversneakers.com or contact program at (626) 856-3428 Mon-Fri between 8AM-8PM.   . Maintain a healthy weight. The body mass index (BMI) is a screening tool to identify possible weight problems. It provides an estimate of body fat based on height and weight. Your health care provider can find your BMI and can help you achieve or maintain a healthy weight.   For adults 20 years and older: ? A BMI below 18.5 is considered underweight. ? A BMI of 18.5 to 24.9 is normal. ? A BMI of 25 to 29.9 is considered overweight. ? A BMI of 30 and above is considered obese.   . Maintain normal blood lipids and cholesterol levels by exercising and minimizing your intake of saturated fat. Eat a balanced diet with plenty of fruit and vegetables. Blood tests for lipids and cholesterol should begin at age 76 and be repeated every 5 years. If your lipid or cholesterol levels are high, you are over 50, or you are at high risk for heart disease, you  may need your cholesterol levels checked more frequently. Ongoing high lipid and cholesterol levels should be treated with medicines if diet and exercise are not working.  . If you smoke, find out from your health care provider how to quit. If you do not use tobacco, please do not start.  . If you choose to drink alcohol, please do not consume more than 2 drinks per day. One drink is considered to be 12 ounces (355 mL) of beer, 5 ounces (148 mL) of wine, or 1.5 ounces (44 mL) of liquor.  . If you are 43-79 years old, ask your health care provider if  you should take aspirin to prevent strokes.  . Use sunscreen. Apply sunscreen liberally and repeatedly throughout the day. You should seek shade when your shadow is shorter than you. Protect yourself by wearing long sleeves, pants, a wide-brimmed hat, and sunglasses year round, whenever you are outdoors.  . Once a month, do a whole body skin exam, using a mirror to look at the skin on your back. Tell your health care provider of new moles, moles that have irregular borders, moles that are larger than a pencil eraser, or moles that have changed in shape or color.

## 2017-12-01 NOTE — Progress Notes (Signed)
PCP notes:   Health maintenance:  No gaps identified.  Abnormal screenings:   Fall risk - hx of single fall Fall Risk  11/29/2017 11/26/2016 08/29/2015 08/27/2014 07/25/2013  Falls in the past year? 1 No Yes Yes Yes  Comment fell while walking in hospital; fell on porch onto glider - pt tripped while on vacation; pt fell while playing with grandchildren - -  Number falls in past yr: 1 - 2 or more - 1  Injury with Fall? 1 - Yes Yes Yes  Comment injury to right knee - - - -  Risk Factor Category  - - High Fall Risk - -  Risk for fall due to : History of fall(s);Impaired balance/gait - History of fall(s) Other (Comment) -  Follow up - - Falls evaluation completed;Falls prevention discussed - -   Patient concerns:   None  Nurse concerns:  None  Next PCP appt:   12/02/17 @ 0930  I reviewed health advisor's note, was available for consultation, and agree with documentation and plan. Loura Pardon MD

## 2017-12-01 NOTE — Progress Notes (Signed)
Subjective:   Sierra Bentley is a 84 y.o. female who presents for Medicare Annual (Subsequent) preventive examination.  Review of Systems:  N/A Cardiac Risk Factors include: advanced age (>82men, >57 women);dyslipidemia;hypertension     Objective:     Vitals: BP 124/78 (BP Location: Right Arm, Patient Position: Sitting, Cuff Size: Normal)   Pulse 82   Temp 97.7 F (36.5 C) (Oral)   Ht 5\' 5"  (1.651 m) Comment: shoes  Wt 151 lb 12 oz (68.8 kg)   SpO2 100%   BMI 25.25 kg/m   Body mass index is 25.25 kg/m.  Advanced Directives 11/29/2017 09/15/2017 11/26/2016 08/29/2015 09/18/2014 07/03/2014  Does Patient Have a Medical Advance Directive? No No No No No No  Would patient like information on creating a medical advance directive? No - Patient declined No - Patient declined - Yes - Scientist, clinical (histocompatibility and immunogenetics) given - No - patient declined information    Tobacco Social History   Tobacco Use  Smoking Status Never Smoker  Smokeless Tobacco Never Used     Counseling given: No   Clinical Intake:  Pre-visit preparation completed: Yes  Pain : No/denies pain Pain Score: 0-No pain     Nutritional Status: BMI 25 -29 Overweight Nutritional Risks: None Diabetes: No  How often do you need to have someone help you when you read instructions, pamphlets, or other written materials from your doctor or pharmacy?: 1 - Never What is the last grade level you completed in school?: Bachelor degree  Interpreter Needed?: No  Comments: pt lives with spouse Information entered by :: LPinson, LPN  Past Medical History:  Diagnosis Date  . Allergic rhinitis, cause unspecified   . Cramp of limb   . Diverticulosis   . Epistaxis   . GERD (gastroesophageal reflux disease)   . HTN (hypertension)   . Osteopenia   . Other and unspecified hyperlipidemia   . Other malaise and fatigue   . Rheumatoid arthritis(714.0)   . Urine incontinence    Past Surgical History:  Procedure Laterality Date  .  BREAST BIOPSY  2/11   fibrocystic change  . BREAST BIOPSY  8/11   fibrocystic change  . COLONOSCOPY  2003  . CYSTOSCOPY  2006  . MOLE REMOVAL     rectal  . TONSILLECTOMY     Family History  Problem Relation Age of Onset  . Heart attack Father 12  . Coronary artery disease Father   . Other Mother        Arrythmia  . Coronary artery disease Mother   . Hypertension Mother   . Anxiety disorder Mother   . Breast cancer Unknown        2nd cousin  . Depression Unknown        family history   Social History   Socioeconomic History  . Marital status: Married    Spouse name: Not on file  . Number of children: Not on file  . Years of education: Not on file  . Highest education level: Not on file  Occupational History  . Not on file  Social Needs  . Financial resource strain: Not on file  . Food insecurity:    Worry: Not on file    Inability: Not on file  . Transportation needs:    Medical: Not on file    Non-medical: Not on file  Tobacco Use  . Smoking status: Never Smoker  . Smokeless tobacco: Never Used  Substance and Sexual Activity  . Alcohol use: No  Alcohol/week: 0.0 standard drinks  . Drug use: No  . Sexual activity: Never  Lifestyle  . Physical activity:    Days per week: Not on file    Minutes per session: Not on file  . Stress: Not on file  Relationships  . Social connections:    Talks on phone: Not on file    Gets together: Not on file    Attends religious service: Not on file    Active member of club or organization: Not on file    Attends meetings of clubs or organizations: Not on file    Relationship status: Not on file  Other Topics Concern  . Not on file  Social History Narrative   Married      No biological children (Has step children)      Taught HS x 30 years      Regular exercise          Outpatient Encounter Medications as of 11/29/2017  Medication Sig  . furosemide (LASIX) 20 MG tablet Take one pill by mouth daily for 5 days  for swelling  . losartan-hydrochlorothiazide (HYZAAR) 50-12.5 MG tablet Take 0.5 tablets by mouth daily.  . Multiple Vitamin (MULTIVITAMIN) tablet Take 1 tablet by mouth daily.    . pantoprazole (PROTONIX) 40 MG tablet Take 40 mg by mouth daily as needed.   Vladimir Faster Glycol-Propyl Glycol (SYSTANE ULTRA OP) Place 1 drop into both eyes 4 (four) times daily.    . sodium chloride (MURO 128) 5 % ophthalmic ointment Place 1 drop into both eyes at bedtime.   . TURMERIC PO Take 2 capsules by mouth daily before breakfast.  . [DISCONTINUED] traMADol (ULTRAM) 50 MG tablet Take 1 tablet (50 mg total) by mouth every 6 (six) hours as needed.   No facility-administered encounter medications on file as of 11/29/2017.     Activities of Daily Living In your present state of health, do you have any difficulty performing the following activities: 11/29/2017  Hearing? N  Vision? N  Difficulty concentrating or making decisions? Y  Walking or climbing stairs? N  Dressing or bathing? N  Doing errands, shopping? N  Preparing Food and eating ? N  Using the Toilet? N  In the past six months, have you accidently leaked urine? Y  Do you have problems with loss of bowel control? N  Managing your Medications? N  Managing your Finances? N  Housekeeping or managing your Housekeeping? N  Some recent data might be hidden    Patient Care Team: Tower, [name], MD as PCP - General Beverly Gust, MD as Referring Physician (Otolaryngology) Shirlee More, MD as Referring Physician (Ophthalmology)    Assessment:   This is a routine wellness examination for Sierra Bentley.  Hearing Screening Comments: Bilateral hearing aids Vision Screening Comments: Vision exam with [facility] in August 2019  Exercise Activities and Dietary recommendations Current Exercise Habits: The patient does not participate in regular exercise at present, Exercise limited by: None identified  Goals    . Increase water  intake     Starting 11/29/2017, I will attempt to drink at least 6-8 glasses of water daily.        Fall Risk Fall Risk  11/29/2017 11/26/2016 08/29/2015 08/27/2014 07/25/2013  Falls in the past year? 1 No Yes Yes Yes  Comment fell while walking in hospital; fell on porch onto glider - pt tripped while on vacation; pt fell while playing with grandchildren - -  Number falls  in past yr: 1 - 2 or more - 1  Injury with Fall? 1 - Yes Yes Yes  Comment injury to right knee - - - -  Risk Factor Category  - - High Fall Risk - -  Risk for fall due to : History of fall(s);Impaired balance/gait - History of fall(s) Other (Comment) -  Follow up - - Falls evaluation completed;Falls prevention discussed - -   Depression Screen PHQ 2/9 Scores 11/29/2017 11/26/2016 08/29/2015 08/27/2014  PHQ - 2 Score 1 0 0 0  PHQ- 9 Score 2 0 - -     Cognitive Function MMSE - Mini Mental State Exam 11/29/2017 11/26/2016 08/29/2015  Orientation to time 5 5 5   Orientation to Place 5 5 5   Registration 3 3 3   Attention/ Calculation 0 0 0  Recall 3 3 2   Recall-comments - - pt was unable to recall 1 of 3 words  Language- name 2 objects 0 0 0  Language- repeat 1 1 1   Language- follow 3 step command 3 3 3   Language- read & follow direction 0 0 0  Write a sentence 0 0 0  Copy design 0 0 0  Total score 20 20 19      PLEASE NOTE: A Mini-Cog screen was completed. Maximum score is 20. A value of 0 denotes this part of [place]stein MMSE was not completed or the patient failed this part of the Mini-Cog screening.   Mini-Cog Screening Orientation to Time - Max 5 pts Orientation to Place - Max 5 pts Registration - Max 3 pts Recall - Max 3 pts Language Repeat - Max 1 pts Language Follow 3 Step Command - Max 3 pts     Immunization History  Administered Date(s) Administered  . Influenza Split 11/30/2010, 12/16/2011  . Influenza Whole 10/26/2007  . Influenza,inj,Quad PF,6+ Mos 11/09/2012, 11/16/2013, 01/03/2015, 11/06/2015, 11/26/2016  .  Pneumococcal Conjugate-13 07/25/2013  . Pneumococcal Polysaccharide-23 11/30/2010  . Td 01/25/2005, 05/17/2016  . Zoster 03/29/2008   Screening Tests Health Maintenance  Topic Date Due  . INFLUENZA VACCINE  05/27/2018 (Originally 08/25/2017)  . MAMMOGRAM  11/01/2018  . TETANUS/TDAP  05/18/2026  . DEXA SCAN  Completed  . PNA vac Low Risk Adult  Completed      Plan:     I have personally reviewed, addressed, and noted the following in the patient's chart:  A. Medical and social history B. Use of alcohol, tobacco or illicit drugs  C. Current medications and supplements D. Functional ability and status E.  Nutritional status F.  Physical activity G. Advance directives H. List of other physicians I.  Hospitalizations, surgeries, and ER visits in previous 12 months J.  [place] to include hearing, vision, cognitive, depression L. Referrals and appointments - none  In addition, I have reviewed and discussed with patient certain preventive protocols, quality metrics, and best practice recommendations. A written personalized care plan for preventive services as well as general preventive health recommendations were provided to patient.  See attached scanned questionnaire for additional information.   Signed,   Lindell Noe, MHA, BS, LPN Health Coach

## 2017-12-02 ENCOUNTER — Ambulatory Visit (INDEPENDENT_AMBULATORY_CARE_PROVIDER_SITE_OTHER): Payer: Medicare Other | Admitting: Family Medicine

## 2017-12-02 ENCOUNTER — Encounter: Payer: Self-pay | Admitting: Family Medicine

## 2017-12-02 VITALS — BP 122/68 | HR 77 | Temp 97.7°F | Ht 65.0 in | Wt 150.8 lb

## 2017-12-02 DIAGNOSIS — I10 Essential (primary) hypertension: Secondary | ICD-10-CM | POA: Diagnosis not present

## 2017-12-02 DIAGNOSIS — R7303 Prediabetes: Secondary | ICD-10-CM

## 2017-12-02 DIAGNOSIS — Z23 Encounter for immunization: Secondary | ICD-10-CM | POA: Diagnosis not present

## 2017-12-02 DIAGNOSIS — Z9181 History of falling: Secondary | ICD-10-CM

## 2017-12-02 DIAGNOSIS — M810 Age-related osteoporosis without current pathological fracture: Secondary | ICD-10-CM

## 2017-12-02 DIAGNOSIS — M069 Rheumatoid arthritis, unspecified: Secondary | ICD-10-CM | POA: Diagnosis not present

## 2017-12-02 DIAGNOSIS — R202 Paresthesia of skin: Secondary | ICD-10-CM

## 2017-12-02 DIAGNOSIS — Z0000 Encounter for general adult medical examination without abnormal findings: Secondary | ICD-10-CM | POA: Diagnosis not present

## 2017-12-02 DIAGNOSIS — E7800 Pure hypercholesterolemia, unspecified: Secondary | ICD-10-CM

## 2017-12-02 MED ORDER — LOSARTAN POTASSIUM-HCTZ 50-12.5 MG PO TABS: 0.5000 | ORAL_TABLET | Freq: Every day | ORAL | 3 refills | Status: DC

## 2017-12-02 NOTE — Patient Instructions (Addendum)
Avoid red meat/ fried foods/ egg yolks/ fatty breakfast meats/ butter, cheese and high fat dairy/ and shellfish   If cholesterol does not come down we should consider medication   To prevent diabetes Try to get most of your carbohydrates from produce (with the exception of white potatoes)  Eat less bread/pasta/rice/snack foods/cereals/sweets and other items from the middle of the grocery store (processed carbs)   Take care of yourself   Flu shot today

## 2017-12-02 NOTE — Progress Notes (Signed)
Subjective:    Patient ID: Sierra Bentley, female    DOB: 1933/03/31, 84 y.o.   MRN: [id number]  HPI Here for health maintenance exam and to review chronic medical problems   Has been feeling pretty good  Still wrestles with dizziness She has seen ENT in the past - and ref for PT   Had amw on 11/5 Noted 2 falls this year  Using a cane now for ambulation Recognized that she does not lift feet high enough  Change in head position makes her dizzy - like turning too quickly  Interested in ref to her new ENT ([place], cannot remember name)    Wt Readings from Last 3 Encounters:  12/02/17 150 lb 12 oz (68.4 kg)  11/29/17 151 lb 12 oz (68.8 kg)  10/07/17 151 lb (68.5 kg)  stable weight  25.09 kg/m   Wants flu shot today   Mammogram 10/19 neg  Self breast exam - no lumps   dexa 3/18 OP worse in hip Improved in spine  D level 34.4 Intol of fosamax in the past   Sees gyn -breast/pelvic   Colonoscopy 9/13  zostavax 3/10   bp is stable today  No cp or palpitations or headaches or edema  No side effects to medicines  BP Readings from Last 3 Encounters:  12/02/17 122/68  11/29/17 124/78  10/07/17 124/68      Hyperlipidemia Lab Results  Component Value Date   CHOL 211 (H) 11/29/2017   CHOL 203 (H) 11/26/2016   CHOL 212 (H) 08/29/2015   Lab Results  Component Value Date   HDL 50.90 11/29/2017   HDL 49.50 11/26/2016   HDL 56.90 08/29/2015   Lab Results  Component Value Date   LDLCALC 141 (H) 11/29/2017   LDLCALC 132 (H) 11/26/2016   LDLCALC 133 (H) 08/29/2015   Lab Results  Component Value Date   TRIG 96.0 11/29/2017   TRIG 105.0 11/26/2016   TRIG 110.0 08/29/2015   Lab Results  Component Value Date   CHOLHDL 4 11/29/2017   CHOLHDL 4 11/26/2016   CHOLHDL 4 08/29/2015   Lab Results  Component Value Date   LDLDIRECT 130.4 02/25/2012   LDLDIRECT 134.1 08/18/2011   LDLDIRECT 155.9 11/30/2010  eating differently  LDL is up  Eating more ice cream  and also milkshakes  Some fried foods    Prediabetes Lab Results  Component Value Date   HGBA1C 5.8 11/29/2017    Lab Results  Component Value Date   CREATININE 0.69 11/29/2017   BUN 14 11/29/2017   NA 139 11/29/2017   K 4.3 11/29/2017   CL 102 11/29/2017   CO2 32 11/29/2017   Lab Results  Component Value Date   WBC 5.7 11/29/2017   HGB 13.2 11/29/2017   HCT 39.7 11/29/2017   MCV 83.4 11/29/2017   PLT 256.0 11/29/2017   Lab Results  Component Value Date   ALT 15 11/29/2017   AST 17 11/29/2017   ALKPHOS 63 11/29/2017   BILITOT 0.5 11/29/2017   Lab Results  Component Value Date   TSH 1.69 11/29/2017    Still has tingling/numbness of feet  abi tests were normal  Will call with the name of a neurologist she wants to see   No anemia B12 is stable Lab Results  Component Value Date   VITAMINB12 499 11/29/2017    Patient Active Problem List   Diagnosis Date Noted  . Tingling of both feet 10/07/2017  . Pedal edema 09/28/2017  .  Fall at home 09/28/2017  . Prediabetes 11/30/2016  . Skin lesion 05/17/2016  . Toenail fungus 10/03/2015  . At moderate risk for fall 09/05/2015  . Cervical spondylosis without myelopathy 08/05/2015  . Neck pain on left side 08/04/2015  . Routine general medical examination at a health care facility 08/27/2014  . Colon cancer screening 08/27/2014  . Fall against object 08/15/2014  . Encounter for Medicare annual wellness exam 07/25/2013  . Varicosities of leg 05/23/2012  . Skin lesion of right lower limb 05/23/2012  . Abnormal ultrasound of thyroid gland 03/01/2011  . Hyperlipidemia 06/11/2008  . ALLERGIC RHINITIS 03/29/2008  . Essential hypertension, benign 12/26/2006  . GERD 12/26/2006  . Rheumatoid arthritis ([place]) 12/26/2006  . Osteoporosis 12/26/2006  . URINARY INCONTINENCE 12/26/2006   Past Medical History:  Diagnosis Date  . Allergic rhinitis, cause unspecified   . Cramp of limb   . Diverticulosis   . Epistaxis   .  GERD (gastroesophageal reflux disease)   . HTN (hypertension)   . Osteopenia   . Other and unspecified hyperlipidemia   . Other malaise and fatigue   . Rheumatoid arthritis(714.0)   . Urine incontinence    Past Surgical History:  Procedure Laterality Date  . BREAST BIOPSY  2/11   fibrocystic change  . BREAST BIOPSY  8/11   fibrocystic change  . COLONOSCOPY  2003  . CYSTOSCOPY  2006  . MOLE REMOVAL     rectal  . TONSILLECTOMY     Social History   Tobacco Use  . Smoking status: Never Smoker  . Smokeless tobacco: Never Used  Substance Use Topics  . Alcohol use: No    Alcohol/week: 0.0 standard drinks  . Drug use: No   Family History  Problem Relation Age of Onset  . Heart attack Father 70  . Coronary artery disease Father   . Other Mother        Arrythmia  . Coronary artery disease Mother   . Hypertension Mother   . Anxiety disorder Mother   . Breast cancer Unknown        2nd cousin  . Depression Unknown        family history   Allergies  Allergen Reactions  . Ace Inhibitors     REACTION: cough  . Alendronate Sodium     REACTION: reflux, trouble swallowing  . Chocolate   . Dust Mite Extract   . Ibandronate Sodium     REACTION: reflux and increased BP   Current Outpatient Medications on File Prior to Visit  Medication Sig Dispense Refill  . furosemide (LASIX) 20 MG tablet Take one pill by mouth daily for 5 days for swelling 10 tablet 0  . Multiple Vitamin (MULTIVITAMIN) tablet Take 1 tablet by mouth daily.      . pantoprazole (PROTONIX) 40 MG tablet Take 40 mg by mouth daily as needed.   11  . Polyethyl Glycol-Propyl Glycol (SYSTANE ULTRA OP) Place 1 drop into both eyes 4 (four) times daily.      . sodium chloride (MURO 128) 5 % ophthalmic ointment Place 1 drop into both eyes at bedtime.     . TURMERIC PO Take 2 capsules by mouth daily before breakfast.     No current facility-administered medications on file prior to visit.     Review of Systems    Constitutional: Negative for activity change, appetite change, fatigue, fever and unexpected weight change.  HENT: Negative for congestion, ear pain, rhinorrhea, sinus pressure and sore throat.  Eyes: Negative for pain, redness and visual disturbance.  Respiratory: Negative for cough, shortness of breath and wheezing.   Cardiovascular: Negative for chest pain and palpitations.       Varicose veins  Gastrointestinal: Negative for abdominal pain, blood in stool, constipation and diarrhea.  Endocrine: Negative for polydipsia and polyuria.  Genitourinary: Negative for dysuria, frequency and urgency.  Musculoskeletal: Negative for arthralgias, back pain and myalgias.  Skin: Negative for pallor and rash.  Allergic/Immunologic: Negative for environmental allergies.  Neurological: Positive for dizziness and numbness. Negative for tremors, seizures, syncope, facial asymmetry, speech difficulty, weakness, light-headedness and headaches.       Poor balance  Hematological: Negative for adenopathy. Does not bruise/bleed easily.  Psychiatric/Behavioral: Negative for decreased concentration and dysphoric mood. The patient is not nervous/anxious.        Objective:   Physical Exam  Constitutional: She appears well-developed and well-nourished. No distress.  Well appearing   HENT:  Head: Normocephalic and atraumatic.  Right Ear: External ear normal.  Left Ear: External ear normal.  Mouth/Throat: Oropharynx is clear and moist.  Eyes: Pupils are equal, round, and reactive to light. Conjunctivae and EOM are normal. No scleral icterus.  Neck: Normal range of motion. Neck supple. No JVD present. Carotid bruit is not present. No thyromegaly present.  Cardiovascular: Normal rate, regular rhythm, normal heart sounds and intact distal pulses. Exam reveals no gallop.  Pulmonary/Chest: Effort normal and breath sounds normal. No respiratory distress. She has no wheezes. She exhibits no tenderness. No breast  tenderness, discharge or bleeding.  Abdominal: Soft. Bowel sounds are normal. She exhibits no distension, no abdominal bruit and no mass. There is no tenderness.  Genitourinary: No breast tenderness, discharge or bleeding.  Genitourinary Comments: Breast exam: No mass, nodules, thickening, tenderness, bulging, retraction, inflamation, nipple discharge or skin changes noted.  No axillary or clavicular LA.      Musculoskeletal: Normal range of motion. She exhibits no edema or tenderness.  Lymphadenopathy:    She has no cervical adenopathy.  Neurological: She is alert. She has normal reflexes. She displays normal reflexes. No cranial nerve deficit. She exhibits normal muscle tone. Coordination normal.  Skin: Skin is warm and dry. No rash noted. No erythema. No pallor.  sks  Brown nevi  Psychiatric: She has a normal mood and affect.  Pleasant           Assessment & Plan:   Problem List Items Addressed This Visit      Cardiovascular and Mediastinum   Essential hypertension, benign    bp in fair control at this time  BP Readings from Last 1 Encounters:  12/02/17 122/68   No changes needed Most recent labs reviewed  Disc lifstyle change with low sodium diet and exercise        Relevant Medications   losartan-hydrochlorothiazide (HYZAAR) 50-12.5 MG tablet     Musculoskeletal and Integument   Osteoporosis    dexa 3/18 - OP   (due 3/20) Intol of fosamax Falls but no fractures  Takes ca and D        Rheumatoid arthritis (HCC)    Stable - no clinical change Not on biologic medicines         Other   At moderate risk for fall    Disc fall prev Has seen ENT for dizziness Was ref to PT from ENT      Hyperlipidemia    Disc goals for lipids and reasons to control them Rev last labs with pt  Rev low sat fat diet in detail LDL is up  Will cut back on fatty dairy products  Consider statin if no improvement       Relevant Medications   losartan-hydrochlorothiazide  (HYZAAR) 50-12.5 MG tablet   Prediabetes    Lab Results  Component Value Date   HGBA1C 5.8 11/29/2017   disc imp of low glycemic diet and wt loss to prevent DM2       Routine general medical examination at a health care facility - Primary    Reviewed health habits including diet and exercise and skin cancer prevention Reviewed appropriate screening tests for age  Also reviewed health mt list, fam hx and immunization status , as well as social and family history   See HPI Labs reviewed Disc diet for hyperlipidemia  Also fall prev and exercise for strength/balance Flu shot today      Tingling of both feet    Tingling/sometimes numbness  ABIs were normal  Some venous insuff Disc poss of neuropathy  Next step is ref to neuro for NCV/ eval She will call when ready to do this  Disc fall prev/ picking up feet and lifting feet higher with gait       Other Visit Diagnoses    Need for influenza vaccination       Relevant Orders   Flu Vaccine QUAD 6+ mos PF IM (Fluarix Quad PF) (Completed)

## 2017-12-04 NOTE — Assessment & Plan Note (Addendum)
Reviewed health habits including diet and exercise and skin cancer prevention Reviewed appropriate screening tests for age  Also reviewed health mt list, fam hx and immunization status , as well as social and family history   See HPI Labs reviewed Disc diet for hyperlipidemia  Also fall prev and exercise for strength/balance Flu shot today

## 2017-12-04 NOTE — Assessment & Plan Note (Signed)
dexa 3/18 - OP   (due 3/20) Intol of fosamax Falls but no fractures  Takes ca and D

## 2017-12-04 NOTE — Assessment & Plan Note (Signed)
Stable - no clinical change Not on biologic medicines

## 2017-12-04 NOTE — Assessment & Plan Note (Signed)
bp in fair control at this time  BP Readings from Last 1 Encounters:  12/02/17 122/68   No changes needed Most recent labs reviewed  Disc lifstyle change with low sodium diet and exercise

## 2017-12-04 NOTE — Assessment & Plan Note (Signed)
Lab Results  Component Value Date   HGBA1C 5.8 11/29/2017   disc imp of low glycemic diet and wt loss to prevent DM2

## 2017-12-04 NOTE — Assessment & Plan Note (Signed)
Tingling/sometimes numbness  ABIs were normal  Some venous insuff Disc poss of neuropathy  Next step is ref to neuro for NCV/ eval She will call when ready to do this  Disc fall prev/ picking up feet and lifting feet higher with gait

## 2017-12-04 NOTE — Assessment & Plan Note (Signed)
Disc goals for lipids and reasons to control them Rev last labs with pt Rev low sat fat diet in detail LDL is up  Will cut back on fatty dairy products  Consider statin if no improvement

## 2017-12-04 NOTE — Assessment & Plan Note (Signed)
Disc fall prev Has seen ENT for dizziness Was ref to PT from ENT

## 2018-02-25 ENCOUNTER — Emergency Department: Payer: Medicare Other

## 2018-02-25 DIAGNOSIS — S32591A Other specified fracture of right pubis, initial encounter for closed fracture: Secondary | ICD-10-CM | POA: Diagnosis not present

## 2018-02-25 DIAGNOSIS — W19XXXA Unspecified fall, initial encounter: Secondary | ICD-10-CM | POA: Diagnosis not present

## 2018-02-25 DIAGNOSIS — I10 Essential (primary) hypertension: Secondary | ICD-10-CM | POA: Diagnosis not present

## 2018-02-25 DIAGNOSIS — M8580 Other specified disorders of bone density and structure, unspecified site: Secondary | ICD-10-CM | POA: Insufficient documentation

## 2018-02-25 DIAGNOSIS — S32413A Displaced fracture of anterior wall of unspecified acetabulum, initial encounter for closed fracture: Principal | ICD-10-CM | POA: Insufficient documentation

## 2018-02-25 DIAGNOSIS — M069 Rheumatoid arthritis, unspecified: Secondary | ICD-10-CM | POA: Diagnosis not present

## 2018-02-25 DIAGNOSIS — M25551 Pain in right hip: Secondary | ICD-10-CM | POA: Diagnosis present

## 2018-02-25 DIAGNOSIS — K219 Gastro-esophageal reflux disease without esophagitis: Secondary | ICD-10-CM | POA: Insufficient documentation

## 2018-02-25 DIAGNOSIS — K5900 Constipation, unspecified: Secondary | ICD-10-CM | POA: Diagnosis not present

## 2018-02-25 DIAGNOSIS — Z79899 Other long term (current) drug therapy: Secondary | ICD-10-CM | POA: Insufficient documentation

## 2018-02-25 DIAGNOSIS — E785 Hyperlipidemia, unspecified: Secondary | ICD-10-CM | POA: Diagnosis not present

## 2018-02-25 NOTE — ED Triage Notes (Signed)
Patient reports mechanical fall today onto right side. Patient c/o right hip pain radiating down right leg. Patient cannot bear weight on hip. Patient denies head injury, LOC. Patient c/o right hand pain.

## 2018-02-26 ENCOUNTER — Observation Stay
Admission: EM | Admit: 2018-02-26 | Discharge: 2018-03-01 | Disposition: A | Discharge status: Skilled Nursing Facility | Payer: Medicare Other | Attending: Internal Medicine | Admitting: Internal Medicine

## 2018-02-26 ENCOUNTER — Emergency Department: Payer: Medicare Other

## 2018-02-26 ENCOUNTER — Other Ambulatory Visit: Payer: Self-pay

## 2018-02-26 DIAGNOSIS — M25551 Pain in right hip: Secondary | ICD-10-CM

## 2018-02-26 DIAGNOSIS — Z8781 Personal history of (healed) traumatic fracture: Secondary | ICD-10-CM | POA: Diagnosis present

## 2018-02-26 DIAGNOSIS — R52 Pain, unspecified: Secondary | ICD-10-CM | POA: Diagnosis present

## 2018-02-26 DIAGNOSIS — S72001A Fracture of unspecified part of neck of right femur, initial encounter for closed fracture: Secondary | ICD-10-CM | POA: Diagnosis present

## 2018-02-26 LAB — BASIC METABOLIC PANEL
Anion gap: 5 (ref 5–15)
BUN: 12 mg/dL (ref 8–23)
CALCIUM: 9.2 mg/dL (ref 8.9–10.3)
CO2: 31 mmol/L (ref 22–32)
Chloride: 103 mmol/L (ref 98–111)
Creatinine, Ser: 0.68 mg/dL (ref 0.44–1.00)
GFR calc Af Amer: >60 mL/min (ref >60)
GLUCOSE: 102 mg/dL — AB (ref 70–99)
Potassium: 3.6 mmol/L (ref 3.5–5.1)
Sodium: 139 mmol/L (ref 135–145)

## 2018-02-26 LAB — GLUCOSE, CAPILLARY: Glucose-Capillary: 106 mg/dL — ABNORMAL HIGH (ref 70–99)

## 2018-02-26 LAB — CBC WITH DIFFERENTIAL/PLATELET
Abs Immature Granulocytes: 0.05 10*3/uL (ref 0.00–0.07)
BASOS PCT: 1 %
Basophils Absolute: 0.1 10*3/uL (ref 0.0–0.1)
EOS ABS: 0.1 10*3/uL (ref 0.0–0.5)
EOS PCT: 1 %
HCT: 38 % (ref 36.0–46.0)
Hemoglobin: 12.1 g/dL (ref 12.0–15.0)
Immature Granulocytes: 1 %
Lymphocytes Relative: 16 %
Lymphs Abs: 1.5 10*3/uL (ref 0.7–4.0)
MCH: 27.1 pg (ref 26.0–34.0)
MCHC: 31.8 g/dL (ref 30.0–36.0)
MCV: 85.2 fL (ref 80.0–100.0)
MONO ABS: 0.6 10*3/uL (ref 0.1–1.0)
MONOS PCT: 7 %
NEUTROS PCT: 74 %
Neutro Abs: 6.9 10*3/uL (ref 1.7–7.7)
PLATELETS: 213 10*3/uL (ref 150–400)
RBC: 4.46 MIL/uL (ref 3.87–5.11)
RDW: 13.4 % (ref 11.5–15.5)
WBC: 9.3 10*3/uL (ref 4.0–10.5)
nRBC: 0 % (ref 0.0–0.2)

## 2018-02-26 MED ORDER — ACETAMINOPHEN 650 MG RE SUPP: 650.0000 mg | Freq: Four times a day (QID) | RECTAL | Status: DC | PRN

## 2018-02-26 MED ORDER — ONDANSETRON HCL 4 MG/2ML IJ SOLN: 4.0000 mg | Freq: Four times a day (QID) | INTRAMUSCULAR | Status: DC | PRN

## 2018-02-26 MED ORDER — BISACODYL 5 MG PO TBEC
5.0000 mg | DELAYED_RELEASE_TABLET | Freq: Every day | ORAL | Status: DC | PRN
  Administered 2018-02-27: 5 mg via ORAL
  Filled 2018-02-26: qty 1

## 2018-02-26 MED ORDER — TURMERIC 500 MG PO CAPS: ORAL_CAPSULE | Freq: Every day | ORAL | Status: DC

## 2018-02-26 MED ORDER — ONDANSETRON HCL 4 MG PO TABS: 4.0000 mg | ORAL_TABLET | Freq: Four times a day (QID) | ORAL | Status: DC | PRN

## 2018-02-26 MED ORDER — POLYETHYLENE GLYCOL 3350 17 G PO PACK: 17.0000 g | PACK | Freq: Every day | ORAL | Status: DC | PRN

## 2018-02-26 MED ORDER — MORPHINE SULFATE (PF) 2 MG/ML IV SOLN: 2.0000 mg | INTRAVENOUS | Status: DC | PRN

## 2018-02-26 MED ORDER — LOSARTAN POTASSIUM-HCTZ 50-12.5 MG PO TABS: 0.5000 | ORAL_TABLET | Freq: Every day | ORAL | Status: DC

## 2018-02-26 MED ORDER — ADULT MULTIVITAMIN W/MINERALS CH
1.0000 | ORAL_TABLET | Freq: Every day | ORAL | Status: DC
  Administered 2018-02-26 – 2018-03-01 (×4): 1 via ORAL
  Filled 2018-02-26 (×4): qty 1

## 2018-02-26 MED ORDER — HYDROCHLOROTHIAZIDE 12.5 MG PO CAPS
12.5000 mg | ORAL_CAPSULE | Freq: Every day | ORAL | Status: DC
  Administered 2018-02-26 – 2018-03-01 (×4): 12.5 mg via ORAL
  Filled 2018-02-26 (×5): qty 1

## 2018-02-26 MED ORDER — ACETAMINOPHEN 325 MG PO TABS
650.0000 mg | ORAL_TABLET | Freq: Four times a day (QID) | ORAL | Status: DC | PRN
  Administered 2018-02-27: 650 mg via ORAL
  Filled 2018-02-26: qty 2

## 2018-02-26 MED ORDER — HYDROCODONE-ACETAMINOPHEN 5-325 MG PO TABS
1.0000 | ORAL_TABLET | Freq: Once | ORAL | Status: AC
  Administered 2018-02-26: 1 via ORAL
  Filled 2018-02-26: qty 1

## 2018-02-26 MED ORDER — LOSARTAN POTASSIUM 50 MG PO TABS
50.0000 mg | ORAL_TABLET | Freq: Every day | ORAL | Status: DC
  Administered 2018-02-26 – 2018-02-28 (×3): 50 mg via ORAL
  Filled 2018-02-26 (×4): qty 1

## 2018-02-26 MED ORDER — ENOXAPARIN SODIUM 40 MG/0.4ML ~~LOC~~ SOLN
40.0000 mg | SUBCUTANEOUS | Status: DC
  Administered 2018-02-26 – 2018-02-28 (×3): 40 mg via SUBCUTANEOUS
  Filled 2018-02-26 (×3): qty 0.4

## 2018-02-26 MED ORDER — HYDROCODONE-ACETAMINOPHEN 5-325 MG PO TABS
1.0000 | ORAL_TABLET | ORAL | Status: DC | PRN
  Administered 2018-02-26: 1 via ORAL
  Filled 2018-02-26: qty 1

## 2018-02-26 NOTE — ED Notes (Signed)
Report to jennifer, rn.  

## 2018-02-26 NOTE — ED Notes (Signed)
Pt changed into gown and all clothing with jewelery removed for MRI. Call bell at right side. Additional warm blankets provided. Pt updated on MRI procedure.

## 2018-02-26 NOTE — ED Notes (Signed)
External female catheter placed.

## 2018-02-26 NOTE — Progress Notes (Signed)
Pastoral Care Visit    02/26/18 1115  Clinical Encounter Type  Visited With Patient;Health care provider  Visit Type Initial;Other (Comment) (HCPOA)  Referral From Physician  Consult/Referral To Chaplain  Stress Factors  Patient Stress Factors Not reviewed   Though HCPOA Consult was put in, pt did not want.  Pt indicated that she and her husband were going to have this done with their attorney.  Chap briefly explained HCPOA and offered a copy of the document.  Pt received.  Darcey Nora, Chaplain

## 2018-02-26 NOTE — ED Notes (Signed)
Bed adjusted for comfort.  

## 2018-02-26 NOTE — Progress Notes (Signed)
PHARMACIST - PHYSICIAN ORDER COMMUNICATION  CONCERNING: P&T Medication Policy on Herbal Medications  DESCRIPTION:  This patient's order for:  Turmeric  has been noted.  This product(s) is classified as an "herbal" or natural product. Due to a lack of definitive safety studies or FDA approval, nonstandard manufacturing practices, plus the potential risk of unknown drug-drug interactions while on inpatient medications, the Pharmacy and Therapeutics Committee does not permit the use of "herbal" or natural products of this type within [facility].   ACTION TAKEN: The pharmacy department is unable to verify this order at this time and your patient has been informed of this safety policy. Please reevaluate patient's clinical condition at discharge and address if the herbal or natural product(s) should be resumed at that time.  Paticia Stack, PharmD Pharmacy Resident  02/26/2018 12:11 PM

## 2018-02-26 NOTE — ED Notes (Addendum)
Patient assisted to toilet by Rod Can and [place] NT. Patient unable to bear weight on right leg, or lift right leg without assistance.

## 2018-02-26 NOTE — H&P (Signed)
[facility] NAME: Sierra Bentley    MR#:  [id number]  DATE OF BIRTH:  October 29, 1933  DATE OF ADMISSION:  02/26/2018  PRIMARY CARE PHYSICIAN: Tower, [name], MD   REQUESTING/REFERRING PHYSICIAN: Dr. Quentin Cornwall  CHIEF COMPLAINT:   Fall and right leg pain HISTORY OF PRESENT ILLNESS:  Sierra Bentley  is a 85 y.o. female with a known history of hypertension who presented to the emergency room via EMS due to above complaint.  Patient reports that she was walking her dog and tripped over the leash and landed on concrete in her driveway on the right side.  She was unable to ambulate and arrived via EMS to the emergency room.  Initial pain was 10 out of 10 in severity.  Pain is now a 7 out of 10.  She is unable to bear weight on the right side.  She was evaluated by orthopedic surgery in the emergency room.  MRI does not show hip fracture but she has a minimally displaced anterior wall acetabular fracture, root of the ramus fracture and inferior pubic rami fracture on the right side.  Pain needs to be controlled with IV medications and therefore patient will need admission to the hospital.  PAST MEDICAL HISTORY:   Past Medical History:  Diagnosis Date  . Allergic rhinitis, cause unspecified   . Cramp of limb   . Diverticulosis   . Epistaxis   . GERD (gastroesophageal reflux disease)   . HTN (hypertension)   . Osteopenia   . Other and unspecified hyperlipidemia   . Other malaise and fatigue   . Rheumatoid arthritis(714.0)   . Urine incontinence     PAST SURGICAL HISTORY:   Past Surgical History:  Procedure Laterality Date  . BREAST BIOPSY  2/11   fibrocystic change  . BREAST BIOPSY  8/11   fibrocystic change  . COLONOSCOPY  2003  . CYSTOSCOPY  2006  . MOLE REMOVAL     rectal  . TONSILLECTOMY      SOCIAL HISTORY:   Social History   Tobacco Use  . Smoking status: Never Smoker  . Smokeless tobacco: Never Used  Substance Use Topics   . Alcohol use: No    Alcohol/week: 0.0 standard drinks    FAMILY HISTORY:   Family History  Problem Relation Age of Onset  . Heart attack Father 78  . Coronary artery disease Father   . Other Mother        Arrythmia  . Coronary artery disease Mother   . Hypertension Mother   . Anxiety disorder Mother   . Breast cancer Other        2nd cousin  . Depression Other        family history    DRUG ALLERGIES:   Allergies  Allergen Reactions  . Ace Inhibitors     REACTION: cough  . Alendronate Sodium     REACTION: reflux, trouble swallowing  . Chocolate   . Dust Mite Extract   . Ibandronate Sodium     REACTION: reflux and increased BP    REVIEW OF SYSTEMS:   Review of Systems  Constitutional: Negative.  Negative for chills, fever and malaise/fatigue.  HENT: Negative.  Negative for ear discharge, ear pain, hearing loss, nosebleeds and sore throat.   Eyes: Negative.  Negative for blurred vision and pain.  Respiratory: Negative.  Negative for cough, hemoptysis, shortness of breath and wheezing.   Cardiovascular: Negative.  Negative for chest  pain, palpitations and leg swelling.  Gastrointestinal: Negative.  Negative for abdominal pain, blood in stool, diarrhea, nausea and vomiting.  Genitourinary: Negative.  Negative for dysuria.  Musculoskeletal: Negative for back pain.       Right leg pain after fall  Skin: Negative.   Neurological: Negative for dizziness, tremors, speech change, focal weakness, seizures and headaches.  Endo/Heme/Allergies: Negative.  Does not bruise/bleed easily.  Psychiatric/Behavioral: Negative.  Negative for depression, hallucinations and suicidal ideas.    MEDICATIONS AT HOME:   Prior to Admission medications   Medication Sig Start Date End Date Taking? Authorizing Provider  furosemide (LASIX) 20 MG tablet Take one pill by mouth daily for 5 days for swelling 09/28/17   Tower, [name], MD  losartan-hydrochlorothiazide (HYZAAR) 50-12.5 MG tablet  Take 0.5 tablets by mouth daily. 12/02/17   Tower, [name], MD  Multiple Vitamin (MULTIVITAMIN) tablet Take 1 tablet by mouth daily.      [provider]  pantoprazole (PROTONIX) 40 MG tablet Take 40 mg by mouth daily as needed.  12/15/13   [provider]  Polyethyl Glycol-Propyl Glycol (SYSTANE ULTRA OP) Place 1 drop into both eyes 4 (four) times daily.      [provider]  sodium chloride (MURO 128) 5 % ophthalmic ointment Place 1 drop into both eyes at bedtime.     [provider]  TURMERIC PO Take 2 capsules by mouth daily before breakfast.    [provider]      VITAL SIGNS:  Blood pressure 106/66, pulse (!) 101, temperature 97.6 F (36.4 C), resp. rate 18, height 5' 5.75" (1.67 m), weight 63.5 kg, SpO2 91 %.  PHYSICAL EXAMINATION:   Physical Exam Constitutional:      General: She is not in acute distress. HENT:     Head: Normocephalic.  Eyes:     General: No scleral icterus. Neck:     Musculoskeletal: Normal range of motion and neck supple.     Vascular: No JVD.     Trachea: No tracheal deviation.  Cardiovascular:     Rate and Rhythm: Normal rate and regular rhythm.     Heart sounds: Normal heart sounds. No murmur. No friction rub. No gallop.   Pulmonary:     Effort: Pulmonary effort is normal. No respiratory distress.     Breath sounds: Normal breath sounds. No wheezing or rales.  Chest:     Chest wall: No tenderness.  Abdominal:     General: Bowel sounds are normal. There is no distension.     Palpations: Abdomen is soft. There is no mass.     Tenderness: There is no abdominal tenderness. There is no guarding or rebound.  Musculoskeletal:     Comments: Unable to move right leg due to pain  Skin:    General: Skin is warm.     Findings: No erythema or rash.  Neurological:     Mental Status: She is alert and oriented to person, place, and time.  Psychiatric:        Judgment: Judgment normal.       LABORATORY  PANEL:   CBC Recent Labs  Lab 02/26/18 0942  WBC 9.3  HGB 12.1  HCT 38.0  PLT 213   ------------------------------------------------------------------------------------------------------------------  Chemistries  Recent Labs  Lab 02/26/18 0942  NA 139  K 3.6  CL 103  CO2 31  GLUCOSE 102*  BUN 12  CREATININE 0.68  CALCIUM 9.2   ------------------------------------------------------------------------------------------------------------------  Cardiac Enzymes No results for  input(s): TROPONINI in the last 168 hours. ------------------------------------------------------------------------------------------------------------------  RADIOLOGY:  Ct Hip Right Wo Contrast  Result Date: 02/26/2018 CLINICAL DATA:  Hip pain after fall. EXAM: CT OF THE RIGHT HIP WITHOUT CONTRAST TECHNIQUE: Multidetector CT imaging of the right hip was performed according to the standard protocol. Multiplanar CT image reconstructions were also generated. COMPARISON:  02/25/2018 radiographs of the pelvis and right hip. FINDINGS: Bones/Joint/Cartilage Acute fractures at the right puboacetabular junction and minimally displaced along the mid right inferior pubic ramus, radiographically occult due to projection. The femoral head is seated within its acetabular component. Faint linear lucencies of the anterior wall of the acetabulum consistent with a nondisplaced fracture is also identified. No fracture of the proximal femur. No joint effusion. Ligaments Suboptimally assessed by CT. Muscles and Tendons No intramuscular hemorrhage. There is mild posttraumatic edema the abductor and obturator internus muscles due to inferior pubic ramus fracture. Soft tissues Mild soft tissue contusion along the posterolateral aspect of the head. IMPRESSION: 1. Acute fractures of the right puboacetabular junction and minimally displaced along the mid right inferior pubic ramus, radiographically occult due to projection. 2. Nondisplaced  fracture of the anterior wall of the acetabulum. Electronically Signed   By: Ashley Royalty M.D.   On: 02/26/2018 03:50   Mr Hip Right Wo Contrast  Result Date: 02/26/2018 CLINICAL DATA:  Right hip pain due to a fall today. Initial encounter. EXAM: MR OF THE RIGHT HIP WITHOUT CONTRAST TECHNIQUE: Multiplanar, multisequence MR imaging was performed. No intravenous contrast was administered. COMPARISON:  CT right hip and plain films right hip 02/26/2018. FINDINGS: Bones: A seen on the prior CT scan, the patient has nondisplaced fractures of the right inferior pubic ramus and high right superior pubic ramus with associated marrow edema consistent with acute or subacute injury. Also seen are fractures of the high left superior pubic ramus and left inferior pubic ramus with a milder degree of marrow edema consistent with more subacute injury. The patient also has a nondisplaced right sacral ala fracture with associated intense marrow edema. A milder degree of marrow edema is seen in the left sacrum consistent with more subacute fracture. The hip is located and there is no hip fracture. Articular cartilage and labrum Articular cartilage:  Mildly degenerated. Labrum:  Degenerated without focal tear. Joint or bursal effusion Joint effusion:  None. Bursae: Negative. Muscles and tendons Muscles and tendons: Intact. There is some edema in the adductor musculature bilaterally, more intense on the right, compatible with strain or contusion related to the patient's fall. Other findings Miscellaneous: Imaged intrapelvic contents demonstrate no acute abnormality. IMPRESSION: This examination is positive for acute high right superior pubic ramus and right inferior pubic ramus fractures. There is also an acute right sacral ala fracture. The fractures are nondisplaced. High left superior pubic ramus and left inferior pubic ramus fractures with mild-to-moderate edema consistent with acute or late subacute injury. Mild marrow edema in  the left sacrum is also consistent with a late subacute fracture. These fractures are nondisplaced. Negative for hip fracture. Right greater than left edema in the adductor musculature is consistent with strain or contusion. No tear. Electronically Signed   By: Inge Rise M.D.   On: 02/26/2018 09:13   Dg Knee Complete 4 Views Right  Result Date: 02/25/2018 CLINICAL DATA:  Encounter gall fall today. Difficulty weight-bearing. EXAM: RIGHT KNEE - COMPLETE 4+ VIEW COMPARISON:  09/15/2017 FINDINGS: Moderate femorotibial and marked patellofemoral joint space narrowing without joint effusion or acute appearing fracture. A transverse  lucency undermining the tibial spines on the AP view with sclerotic appearing margins may represent a cleft as opposed to a fracture given lack of joint effusion. Mild soft tissue induration is noted along the anteromedial aspect of the knee. No joint dislocation is identified. Osteoarthritic spurring the tibial spines. IMPRESSION: 1. Soft tissue induration along the anteromedial aspect of the knee. 2. Moderate femorotibial and marked patellofemoral osteoarthritic joint space narrowing without joint effusion or acute appearing fracture. 3. A transverse lucency undermining the tibial spines on the AP view with sclerotic appearing margins may represent a cleft or stigmata of old remote trauma as opposed to a fracture given lack of joint effusion. Electronically Signed   By: Ashley Royalty M.D.   On: 02/25/2018 21:43   Dg Hand Complete Right  Result Date: 02/25/2018 CLINICAL DATA:  Right hand pain after fall. EXAM: RIGHT HAND - COMPLETE 3+ VIEW COMPARISON:  None. FINDINGS: Mix of inflammatory and erosive osteoarthritis is noted of the right hand. There is an ankylosed appearance of the right fifth DIP joint. Erosive osteoarthritic change with marginal erosions are identified of the second and third DIP and second PIP joints with mild soft tissue swelling. Moderate joint space narrowing of  the third through fifth PIP joints with small extra-articular and marginal erosions noted of the fifth digit and marginal erosions of the fourth digit. Marginal erosion of the base of the right second proximal phalanx is also identified. Osteoarthritis of the interphalangeal joint of thumb. Carpal rows are maintained with osteoarthritic joint space narrowing at base of the thumb metacarpal and triscaphe joint wrist. Subcortical cysts or erosions of the ulna are identified. 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 No acute osseous abnormality. Electronically Signed   By: Ashley Royalty M.D.   On: 02/25/2018 21:36   Dg Hip Unilat  With Pelvis 2-3 Views Right  Result Date: 02/25/2018 CLINICAL DATA:  Right-sided pain after mechanical fall today. EXAM: DG HIP (WITH OR WITHOUT PELVIS) 2-3V RIGHT COMPARISON:  None. FINDINGS: L4-5 and L5-S1 facet arthropathy is seen. Osteoarthritis of the right SI joint relative to left. No pelvic diastasis or fracture. Slight joint space narrowing of the left hip relative to right. No acute fracture of the proximal femora. No joint dislocation is seen. Soft tissues are largely unremarkable in appearance. IMPRESSION: 1. No acute osseous abnormality of the bony pelvis and right hip. 2. Lower lumbar facet arthropathy. 3. Osteoarthritis of the right SI joint. Electronically Signed   By: Ashley Royalty M.D.   On: 02/25/2018 21:31    EKG:  Pending  IMPRESSION AND PLAN:   85 year old female with history of hypertension who suffered a mechanical fall and now has a right minimally displaced anterior wall acetabular fracture, root of the rami fracture and inferior pubic rami fracture on the right side.  1.right minimally displaced anterior wall acetabular fracture, root of the rami fracture and inferior pubic rami fracture on the right side. Patient evaluated by orthopedic surgery.  Given minimally displaced nature of these fractures nonsurgical  management has been recommended by Dr. Posey Pronto. Patient can be FFWB on right lower extremity Physical therapy consultation placed. Continue IV pain medications and transition to oral when tolerable  2.  Essential hypertension: Continue Hyzaar    All the records are reviewed and case discussed with ED provider. Management plans discussed with the patient and she is in agreement  CODE STATUS: Full  TOTAL TIME TAKING CARE OF THIS PATIENT: 61  minutes.    Ariz Terrones M.D on 02/26/2018 at 10:42 AM  Between 7am to 6pm - Pager - [phone]  After 6pm go to [email or web address] - password [email or web address] [facility]  Office  [phone]  CC: Primary care physician; Tower, [name], MD

## 2018-02-26 NOTE — Consult Note (Signed)
ORTHOPAEDIC CONSULTATION  REQUESTING PHYSICIAN: Delman Kitten, MD  Chief Complaint:   R leg, back pain  History of Present Illness: Sierra Bentley is a 85 y.o. female who had a fall onto concrete last night.  The patient noted inability to ambulate due to pain on her right side.  Pain is located along the right lateral femur and right paraspinal region of her lumbar spine..  The patient ambulates with a cane occasionally.  Pain is described as sharp at its worst and a dull ache at its best.  Pain is rated a 10 out of 10 in severity.  Pain is improved with rest and immobilization.  Pain is worse with weightbearing.  Imaging in the emergency department shows a minimally displaced anterior wall acetabular fracture, root of the ramus fracture, and inferior pubic ramus fracture on the right side.  MRI did not show any hip fracture.  Past Medical History:  Diagnosis Date  . Allergic rhinitis, cause unspecified   . Cramp of limb   . Diverticulosis   . Epistaxis   . GERD (gastroesophageal reflux disease)   . HTN (hypertension)   . Osteopenia   . Other and unspecified hyperlipidemia   . Other malaise and fatigue   . Rheumatoid arthritis(714.0)   . Urine incontinence    Past Surgical History:  Procedure Laterality Date  . BREAST BIOPSY  2/11   fibrocystic change  . BREAST BIOPSY  8/11   fibrocystic change  . COLONOSCOPY  2003  . CYSTOSCOPY  2006  . MOLE REMOVAL     rectal  . TONSILLECTOMY     Social History   Socioeconomic History  . Marital status: Married    Spouse name: Not on file  . Number of children: Not on file  . Years of education: Not on file  . Highest education level: Not on file  Occupational History  . Not on file  Social Needs  . Financial resource strain: Not on file  . Food insecurity:    Worry: Not on file    Inability: Not on file  . Transportation needs:    Medical: Not on file     Non-medical: Not on file  Tobacco Use  . Smoking status: Never Smoker  . Smokeless tobacco: Never Used  Substance and Sexual Activity  . Alcohol use: No    Alcohol/week: 0.0 standard drinks  . Drug use: No  . Sexual activity: Never  Lifestyle  . Physical activity:    Days per week: Not on file    Minutes per session: Not on file  . Stress: Not on file  Relationships  . Social connections:    Talks on phone: Not on file    Gets together: Not on file    Attends religious service: Not on file    Active member of club or organization: Not on file    Attends meetings of clubs or organizations: Not on file    Relationship status: Not on file  Other Topics Concern  . Not on file  Social History Narrative   Married      No biological children (Has step children)      Taught HS x 30 years      Regular exercise         Family History  Problem Relation Age of Onset  . Heart attack Father 23  . Coronary artery disease Father   . Other Mother        Arrythmia  . Coronary  artery disease Mother   . Hypertension Mother   . Anxiety disorder Mother   . Breast cancer Other        2nd cousin  . Depression Other        family history   Allergies  Allergen Reactions  . Ace Inhibitors     REACTION: cough  . Alendronate Sodium     REACTION: reflux, trouble swallowing  . Chocolate   . Dust Mite Extract   . Ibandronate Sodium     REACTION: reflux and increased BP   Prior to Admission medications   Medication Sig Start Date End Date Taking? Authorizing Provider  furosemide (LASIX) 20 MG tablet Take one pill by mouth daily for 5 days for swelling 09/28/17   Tower, [name], MD  losartan-hydrochlorothiazide (HYZAAR) 50-12.5 MG tablet Take 0.5 tablets by mouth daily. 12/02/17   Tower, [name], MD  Multiple Vitamin (MULTIVITAMIN) tablet Take 1 tablet by mouth daily.      [provider]  pantoprazole (PROTONIX) 40 MG tablet Take 40 mg by mouth daily as needed.  12/15/13    [provider]  Polyethyl Glycol-Propyl Glycol (SYSTANE ULTRA OP) Place 1 drop into both eyes 4 (four) times daily.      [provider]  sodium chloride (MURO 128) 5 % ophthalmic ointment Place 1 drop into both eyes at bedtime.     [provider]  TURMERIC PO Take 2 capsules by mouth daily before breakfast.    [provider]   Recent Labs    02/26/18 0942  WBC 9.3  HGB 12.1  HCT 38.0  PLT 213  K 3.6  CL 103  CO2 31  BUN 12  CREATININE 0.68  GLUCOSE 102*  CALCIUM 9.2   Ct Hip Right Wo Contrast  Result Date: 02/26/2018 CLINICAL DATA:  Hip pain after fall. EXAM: CT OF THE RIGHT HIP WITHOUT CONTRAST TECHNIQUE: Multidetector CT imaging of the right hip was performed according to the standard protocol. Multiplanar CT image reconstructions were also generated. COMPARISON:  02/25/2018 radiographs of the pelvis and right hip. FINDINGS: Bones/Joint/Cartilage Acute fractures at the right puboacetabular junction and minimally displaced along the mid right inferior pubic ramus, radiographically occult due to projection. The femoral head is seated within its acetabular component. Faint linear lucencies of the anterior wall of the acetabulum consistent with a nondisplaced fracture is also identified. No fracture of the proximal femur. No joint effusion. Ligaments Suboptimally assessed by CT. Muscles and Tendons No intramuscular hemorrhage. There is mild posttraumatic edema the abductor and obturator internus muscles due to inferior pubic ramus fracture. Soft tissues Mild soft tissue contusion along the posterolateral aspect of the head. IMPRESSION: 1. Acute fractures of the right puboacetabular junction and minimally displaced along the mid right inferior pubic ramus, radiographically occult due to projection. 2. Nondisplaced fracture of the anterior wall of the acetabulum. Electronically Signed   By: Ashley Royalty M.D.   On: 02/26/2018 03:50   Mr Hip Right Wo  Contrast  Result Date: 02/26/2018 CLINICAL DATA:  Right hip pain due to a fall today. Initial encounter. EXAM: MR OF THE RIGHT HIP WITHOUT CONTRAST TECHNIQUE: Multiplanar, multisequence MR imaging was performed. No intravenous contrast was administered. COMPARISON:  CT right hip and plain films right hip 02/26/2018. FINDINGS: Bones: A seen on the prior CT scan, the patient has nondisplaced fractures of the right inferior pubic ramus and high right superior pubic ramus with associated marrow edema consistent with acute or subacute  injury. Also seen are fractures of the high left superior pubic ramus and left inferior pubic ramus with a milder degree of marrow edema consistent with more subacute injury. The patient also has a nondisplaced right sacral ala fracture with associated intense marrow edema. A milder degree of marrow edema is seen in the left sacrum consistent with more subacute fracture. The hip is located and there is no hip fracture. Articular cartilage and labrum Articular cartilage:  Mildly degenerated. Labrum:  Degenerated without focal tear. Joint or bursal effusion Joint effusion:  None. Bursae: Negative. Muscles and tendons Muscles and tendons: Intact. There is some edema in the adductor musculature bilaterally, more intense on the right, compatible with strain or contusion related to the patient's fall. Other findings Miscellaneous: Imaged intrapelvic contents demonstrate no acute abnormality. IMPRESSION: This examination is positive for acute high right superior pubic ramus and right inferior pubic ramus fractures. There is also an acute right sacral ala fracture. The fractures are nondisplaced. High left superior pubic ramus and left inferior pubic ramus fractures with mild-to-moderate edema consistent with acute or late subacute injury. Mild marrow edema in the left sacrum is also consistent with a late subacute fracture. These fractures are nondisplaced. Negative for hip fracture. Right  greater than left edema in the adductor musculature is consistent with strain or contusion. No tear. Electronically Signed   By: Inge Rise M.D.   On: 02/26/2018 09:13   Dg Knee Complete 4 Views Right  Result Date: 02/25/2018 CLINICAL DATA:  Encounter gall fall today. Difficulty weight-bearing. EXAM: RIGHT KNEE - COMPLETE 4+ VIEW COMPARISON:  09/15/2017 FINDINGS: Moderate femorotibial and marked patellofemoral joint space narrowing without joint effusion or acute appearing fracture. A transverse lucency undermining the tibial spines on the AP view with sclerotic appearing margins may represent a cleft as opposed to a fracture given lack of joint effusion. Mild soft tissue induration is noted along the anteromedial aspect of the knee. No joint dislocation is identified. Osteoarthritic spurring the tibial spines. IMPRESSION: 1. Soft tissue induration along the anteromedial aspect of the knee. 2. Moderate femorotibial and marked patellofemoral osteoarthritic joint space narrowing without joint effusion or acute appearing fracture. 3. A transverse lucency undermining the tibial spines on the AP view with sclerotic appearing margins may represent a cleft or stigmata of old remote trauma as opposed to a fracture given lack of joint effusion. Electronically Signed   By: Ashley Royalty M.D.   On: 02/25/2018 21:43   Dg Hand Complete Right  Result Date: 02/25/2018 CLINICAL DATA:  Right hand pain after fall. EXAM: RIGHT HAND - COMPLETE 3+ VIEW COMPARISON:  None. FINDINGS: Mix of inflammatory and erosive osteoarthritis is noted of the right hand. There is an ankylosed appearance of the right fifth DIP joint. Erosive osteoarthritic change with marginal erosions are identified of the second and third DIP and second PIP joints with mild soft tissue swelling. Moderate joint space narrowing of the third through fifth PIP joints with small extra-articular and marginal erosions noted of the fifth digit and marginal erosions  of the fourth digit. Marginal erosion of the base of the right second proximal phalanx is also identified. Osteoarthritis of the interphalangeal joint of thumb. Carpal rows are maintained with osteoarthritic joint space narrowing at base of the thumb metacarpal and triscaphe joint wrist. Subcortical cysts or erosions of the ulna are identified. 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 No acute osseous abnormality. Electronically Signed   By: Shanon Brow  Randel Pigg M.D.   On: 02/25/2018 21:36   Dg Hip Unilat  With Pelvis 2-3 Views Right  Result Date: 02/25/2018 CLINICAL DATA:  Right-sided pain after mechanical fall today. EXAM: DG HIP (WITH OR WITHOUT PELVIS) 2-3V RIGHT COMPARISON:  None. FINDINGS: L4-5 and L5-S1 facet arthropathy is seen. Osteoarthritis of the right SI joint relative to left. No pelvic diastasis or fracture. Slight joint space narrowing of the left hip relative to right. No acute fracture of the proximal femora. No joint dislocation is seen. Soft tissues are largely unremarkable in appearance. IMPRESSION: 1. No acute osseous abnormality of the bony pelvis and right hip. 2. Lower lumbar facet arthropathy. 3. Osteoarthritis of the right SI joint. Electronically Signed   By: Ashley Royalty M.D.   On: 02/25/2018 21:31     Positive ROS: All other systems have been reviewed and were otherwise negative with the exception of those mentioned in the HPI and as above.  Physical Exam: BP 106/66   Pulse (!) 101   Temp 97.6 F (36.4 C)   Resp 18   Ht 5' 5.75" (1.67 m)   Wt 63.5 kg   SpO2 91%   BMI 22.77 kg/m  General:  Alert, no acute distress Psychiatric:  Patient is competent for consent with normal mood and affect   Cardiovascular:  No pedal edema, regular rate and rhythm Respiratory:  No wheezing, non-labored breathing GI:  Abdomen is soft and non-tender Skin:  No lesions in the area of chief complaint, no erythema Neurologic:  Sensation intact  distally, CN grossly intact Lymphatic:  No axillary or cervical lymphadenopathy  Orthopedic Exam:  RLE: 5/5 DF/PF/EHL SILT s/s/t/sp/dp distr Foot wwp Negative logroll and axial load. Positive for paraspinal tenderness about her lumbar spine on the right side   X-rays:  As above: minimally displaced anterior wall acetabular fracture, root of the ramus fracture, and inferior pubic ramus fracture on the right side.  Assessment/Plan: Sierra Bentley is a 85 y.o. female with R minimally displaced anterior wall acetabular fracture, root of the ramus fracture, and inferior pubic ramus fracture on the right side.   1. I discussed the fracture with the patient and her husband.  Given the minimally displaced nature of these fractures, we agreed to proceed with nonsurgical management.  2.  Patient can be FFWB on right lower extremity 3.  Physical therapy daily for rehab. 4.  May need IV pain medications for the short-term and transition to p.o. analgesia including Tylenol and anti-inflammatories as needed 5.  Admit to hospitalist service for likely placement.   Leim Fabry   02/26/2018 10:12 AM

## 2018-02-26 NOTE — Clinical Social Work Note (Signed)
Clinical Social Work Assessment  Patient Details  Name: Sierra Bentley MRN: [id number] Date of Birth: 03/22/1933  Date of referral:  02/26/18               Reason for consult:  Facility Placement                Permission sought to share information with:  Chartered certified accountant granted to share information::  Yes, Verbal Permission Granted  Name::        Agency::  [facility]  Relationship::     Contact Information:     Housing/Transportation Living arrangements for the past 2 months:  [place] of Information:  Patient, Medical Team, Spouse Patient Interpreter Needed:  None Criminal Activity/Legal Involvement Pertinent to Current Situation/Hospitalization:  No - Comment as needed Significant Relationships:  Adult Children, Church, [employer], Spouse Lives with:  Spouse Do you feel safe going back to the place where you live?  Yes Need for family participation in patient care:  No (Coment)  Care giving concerns:  Patient has pelvic fracture and cannot bear weight. PT is pending; however, SNF placement may be discharge plan.   Social Worker assessment / plan:  The CSW spoke with the patient's husband about discharge planning. The CSW explained the different options (SNF vs. HH) and benefits of both. The CSW explained that the PT evaluation is pending. The patient's husband reported that he and the patient agree with SNF placement should PT recommend such, and he indicated that [facility] is the preference. The CSW provided a Medicare.gov list of SNFs and also explained that [facility] Medicare would need to provide prior authorization. The patient's husband had no further questions.  Currently, the patient is listed as inpatient; however, due to the nature of her injury and the non-surgical intervention, she may become a Code 44. The patient's insurance company would still consider placement authorization, even if the patient is  under observation status. The CSW is following for discharge planning tomorrow or Tuesday pending auth and bed offers.  Employment status:  Retired Nurse, adult PT Recommendations:  Not assessed at this time Information / Referral to community resources:  [place]  Patient/Family's Response to care: The patient's spouse thanked the CSW.  Patient/Family's Understanding of and Emotional Response to Diagnosis, Current Treatment, and Prognosis:  The patient's spouse seems to understand the discharge plan and is in agreement with SNF.  Emotional Assessment Appearance:  Appears stated age Attitude/Demeanor/Rapport:  Lethargic Affect (typically observed):  Stable Orientation:  Oriented to Self, Oriented to Place, Oriented to  Time, Oriented to Situation Alcohol / Substance use:  Never Used Psych involvement (Current and /or in the community):  No (Comment)  Discharge Needs  Concerns to be addressed:  Discharge Planning Concerns, Care Coordination Readmission within the last 30 days:  No Current discharge risk:  Physical Impairment Barriers to Discharge:  Continued Medical Work up   [employer], LCSW 02/26/2018, 1:52 PM

## 2018-02-26 NOTE — ED Notes (Signed)
ED TO INPATIENT HANDOFF REPORT  ED Nurse Name and Phone #: Anderson Malta [phone]  Name/Age/Gender Sierra Bentley 85 y.o. female Room/Bed: ED12A/ED12A  Code Status   Code Status: Not on file  Home/SNF/Other Rehab Patient oriented to: self, place, time and situation Is this baseline? Yes   Triage Complete: Triage complete  Chief Complaint Fall  Triage Note Patient reports mechanical fall today onto right side. Patient c/o right hip pain radiating down right leg. Patient cannot bear weight on hip. Patient denies head injury, LOC. Patient c/o right hand pain.    Allergies Allergies  Allergen Reactions  . Ace Inhibitors     REACTION: cough  . Alendronate Sodium     REACTION: reflux, trouble swallowing  . Chocolate   . Dust Mite Extract   . Ibandronate Sodium     REACTION: reflux and increased BP    Level of Care/Admitting Diagnosis ED Disposition    ED Disposition Condition [facility] Area: [place] [[id number]]  Level of Care: Med-Surg [16]  Diagnosis: Inadequate pain control [233007]  Admitting Physician: Bettey Costa [[id number]]  Attending Physician: MODY, Ulice Bold [[id number]]  Estimated length of stay: past midnight tomorrow  Certification:: I certify this patient will need inpatient services for at least 2 midnights  PT Class (Do Not Modify): Inpatient [101]  PT Acc Code (Do Not Modify): Private [1]       Medical/Surgery History Past Medical History:  Diagnosis Date  . Allergic rhinitis, cause unspecified   . Cramp of limb   . Diverticulosis   . Epistaxis   . GERD (gastroesophageal reflux disease)   . HTN (hypertension)   . Osteopenia   . Other and unspecified hyperlipidemia   . Other malaise and fatigue   . Rheumatoid arthritis(714.0)   . Urine incontinence    Past Surgical History:  Procedure Laterality Date  . BREAST BIOPSY  2/11   fibrocystic change  . BREAST BIOPSY  8/11   fibrocystic change  . COLONOSCOPY  2003  .  CYSTOSCOPY  2006  . MOLE REMOVAL     rectal  . TONSILLECTOMY       IV Location/Drains/Wounds Patient Lines/Drains/Airways Status   Active Line/Drains/Airways    Name:   Placement date:   Placement time:   Site:   Days:   Peripheral IV 02/26/18 Left;Lateral Arm   02/26/18    0941    Arm   less than 1          Intake/Output Last 24 hours No intake or output data in the 24 hours ending 02/26/18 1053  Labs/Imaging Results for orders placed or performed during the hospital encounter of 02/26/18 (from the past 48 hour(s))  Glucose, capillary     Status: Abnormal   Collection Time: 02/26/18  5:47 AM  Result Value Ref Range   Glucose-Capillary 106 (H) 70 - 99 mg/dL  CBC with Differential/Platelet     Status: None   Collection Time: 02/26/18  9:42 AM  Result Value Ref Range   WBC 9.3 4.0 - 10.5 K/uL   RBC 4.46 3.87 - 5.11 MIL/uL   Hemoglobin 12.1 12.0 - 15.0 g/dL   HCT 38.0 36.0 - 46.0 %   MCV 85.2 80.0 - 100.0 fL   MCH 27.1 26.0 - 34.0 pg   MCHC 31.8 30.0 - 36.0 g/dL   RDW 13.4 11.5 - 15.5 %   Platelets 213 150 - 400 K/uL   nRBC 0.0 0.0 - 0.2 %  Neutrophils Relative % 74 %   Neutro Abs 6.9 1.7 - 7.7 K/uL   Lymphocytes Relative 16 %   Lymphs Abs 1.5 0.7 - 4.0 K/uL   Monocytes Relative 7 %   Monocytes Absolute 0.6 0.1 - 1.0 K/uL   Eosinophils Relative 1 %   Eosinophils Absolute 0.1 0.0 - 0.5 K/uL   Basophils Relative 1 %   Basophils Absolute 0.1 0.0 - 0.1 K/uL   Immature Granulocytes 1 %   Abs Immature Granulocytes 0.05 0.00 - 0.07 K/uL    Comment: Performed at [facility], [address] [place], [place] [address]  Basic metabolic panel     Status: Abnormal   Collection Time: 02/26/18  9:42 AM  Result Value Ref Range   Sodium 139 135 - 145 mmol/L   Potassium 3.6 3.5 - 5.1 mmol/L   Chloride 103 98 - 111 mmol/L   CO2 31 22 - 32 mmol/L   Glucose, Bld 102 (H) 70 - 99 mg/dL   BUN 12 8 - 23 mg/dL   Creatinine, Ser 0.68 0.44 - 1.00 mg/dL   Calcium 9.2 8.9 -  10.3 mg/dL   GFR calc non Af Amer >60 >60 mL/min   GFR calc Af Amer >60 >60 mL/min   Anion gap 5 5 - 15    Comment: Performed at [facility], [address], [place], [place] [address]   Ct Hip Right Wo Contrast  Result Date: 02/26/2018 CLINICAL DATA:  Hip pain after fall. EXAM: CT OF THE RIGHT HIP WITHOUT CONTRAST TECHNIQUE: Multidetector CT imaging of the right hip was performed according to the standard protocol. Multiplanar CT image reconstructions were also generated. COMPARISON:  02/25/2018 radiographs of the pelvis and right hip. FINDINGS: Bones/Joint/Cartilage Acute fractures at the right puboacetabular junction and minimally displaced along the mid right inferior pubic ramus, radiographically occult due to projection. The femoral head is seated within its acetabular component. Faint linear lucencies of the anterior wall of the acetabulum consistent with a nondisplaced fracture is also identified. No fracture of the proximal femur. No joint effusion. Ligaments Suboptimally assessed by CT. Muscles and Tendons No intramuscular hemorrhage. There is mild posttraumatic edema the abductor and obturator internus muscles due to inferior pubic ramus fracture. Soft tissues Mild soft tissue contusion along the posterolateral aspect of the head. IMPRESSION: 1. Acute fractures of the right puboacetabular junction and minimally displaced along the mid right inferior pubic ramus, radiographically occult due to projection. 2. Nondisplaced fracture of the anterior wall of the acetabulum. Electronically Signed   By: Ashley Royalty M.D.   On: 02/26/2018 03:50   Mr Hip Right Wo Contrast  Result Date: 02/26/2018 CLINICAL DATA:  Right hip pain due to a fall today. Initial encounter. EXAM: MR OF THE RIGHT HIP WITHOUT CONTRAST TECHNIQUE: Multiplanar, multisequence MR imaging was performed. No intravenous contrast was administered. COMPARISON:  CT right hip and plain films right hip 02/26/2018. FINDINGS: Bones: A  seen on the prior CT scan, the patient has nondisplaced fractures of the right inferior pubic ramus and high right superior pubic ramus with associated marrow edema consistent with acute or subacute injury. Also seen are fractures of the high left superior pubic ramus and left inferior pubic ramus with a milder degree of marrow edema consistent with more subacute injury. The patient also has a nondisplaced right sacral ala fracture with associated intense marrow edema. A milder degree of marrow edema is seen in the left sacrum consistent with more subacute fracture. The  hip is located and there is no hip fracture. Articular cartilage and labrum Articular cartilage:  Mildly degenerated. Labrum:  Degenerated without focal tear. Joint or bursal effusion Joint effusion:  None. Bursae: Negative. Muscles and tendons Muscles and tendons: Intact. There is some edema in the adductor musculature bilaterally, more intense on the right, compatible with strain or contusion related to the patient's fall. Other findings Miscellaneous: Imaged intrapelvic contents demonstrate no acute abnormality. IMPRESSION: This examination is positive for acute high right superior pubic ramus and right inferior pubic ramus fractures. There is also an acute right sacral ala fracture. The fractures are nondisplaced. High left superior pubic ramus and left inferior pubic ramus fractures with mild-to-moderate edema consistent with acute or late subacute injury. Mild marrow edema in the left sacrum is also consistent with a late subacute fracture. These fractures are nondisplaced. Negative for hip fracture. Right greater than left edema in the adductor musculature is consistent with strain or contusion. No tear. Electronically Signed   By: Inge Rise M.D.   On: 02/26/2018 09:13   Dg Knee Complete 4 Views Right  Result Date: 02/25/2018 CLINICAL DATA:  Encounter gall fall today. Difficulty weight-bearing. EXAM: RIGHT KNEE - COMPLETE 4+ VIEW  COMPARISON:  09/15/2017 FINDINGS: Moderate femorotibial and marked patellofemoral joint space narrowing without joint effusion or acute appearing fracture. A transverse lucency undermining the tibial spines on the AP view with sclerotic appearing margins may represent a cleft as opposed to a fracture given lack of joint effusion. Mild soft tissue induration is noted along the anteromedial aspect of the knee. No joint dislocation is identified. Osteoarthritic spurring the tibial spines. IMPRESSION: 1. Soft tissue induration along the anteromedial aspect of the knee. 2. Moderate femorotibial and marked patellofemoral osteoarthritic joint space narrowing without joint effusion or acute appearing fracture. 3. A transverse lucency undermining the tibial spines on the AP view with sclerotic appearing margins may represent a cleft or stigmata of old remote trauma as opposed to a fracture given lack of joint effusion. Electronically Signed   By: Ashley Royalty M.D.   On: 02/25/2018 21:43   Dg Hand Complete Right  Result Date: 02/25/2018 CLINICAL DATA:  Right hand pain after fall. EXAM: RIGHT HAND - COMPLETE 3+ VIEW COMPARISON:  None. FINDINGS: Mix of inflammatory and erosive osteoarthritis is noted of the right hand. There is an ankylosed appearance of the right fifth DIP joint. Erosive osteoarthritic change with marginal erosions are identified of the second and third DIP and second PIP joints with mild soft tissue swelling. Moderate joint space narrowing of the third through fifth PIP joints with small extra-articular and marginal erosions noted of the fifth digit and marginal erosions of the fourth digit. Marginal erosion of the base of the right second proximal phalanx is also identified. Osteoarthritis of the interphalangeal joint of thumb. Carpal rows are maintained with osteoarthritic joint space narrowing at base of the thumb metacarpal and triscaphe joint wrist. Subcortical cysts or erosions of the ulna are  identified. 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 No acute osseous abnormality. Electronically Signed   By: Ashley Royalty M.D.   On: 02/25/2018 21:36   Dg Hip Unilat  With Pelvis 2-3 Views Right  Result Date: 02/25/2018 CLINICAL DATA:  Right-sided pain after mechanical fall today. EXAM: DG HIP (WITH OR WITHOUT PELVIS) 2-3V RIGHT COMPARISON:  None. FINDINGS: L4-5 and L5-S1 facet arthropathy is seen. Osteoarthritis of the right SI joint relative to left. No pelvic  diastasis or fracture. Slight joint space narrowing of the left hip relative to right. No acute fracture of the proximal femora. No joint dislocation is seen. Soft tissues are largely unremarkable in appearance. IMPRESSION: 1. No acute osseous abnormality of the bony pelvis and right hip. 2. Lower lumbar facet arthropathy. 3. Osteoarthritis of the right SI joint. Electronically Signed   By: Ashley Royalty M.D.   On: 02/25/2018 21:31    Pending Labs [employer] (From admission, onward)    Start     Ordered   Signed and Held  CBC  (enoxaparin (LOVENOX)    CrCl >/= 30 ml/min)  Once,   R    Comments:  Baseline for enoxaparin therapy IF NOT ALREADY DRAWN.  Notify MD if PLT < 100 K.    Signed and Held   Signed and Held  Creatinine, serum  (enoxaparin (LOVENOX)    CrCl >/= 30 ml/min)  Once,   R    Comments:  Baseline for enoxaparin therapy IF NOT ALREADY DRAWN.    Signed and Held   Signed and Held  Creatinine, serum  (enoxaparin (LOVENOX)    CrCl >/= 30 ml/min)  Weekly,   R    Comments:  while on enoxaparin therapy    Signed and Held          Vitals/Pain Today's Vitals   02/26/18 0545 02/26/18 0600 02/26/18 1043 02/26/18 1044  BP:  106/66 131/71   Pulse: 99 (!) 101 (!) 103   Resp:   18   Temp:      SpO2: 93% 91% 93%   Weight:      Height:      PainSc:    4     Isolation Precautions No active isolations  Medications Medications  HYDROcodone-acetaminophen (NORCO/VICODIN)  5-325 MG per tablet 1 tablet (1 tablet Oral Given 02/26/18 0549)    Mobility non-ambulatory Moderate fall risk   Focused Assessments    Recommendations: See Admitting Provider Note  Report given to:   Additional Notes:

## 2018-02-26 NOTE — ED Provider Notes (Signed)
[facility] Emergency Department Provider Note   ____________________________________________   First MD Initiated Contact with Patient 02/26/18 0502     (approximate)  I have reviewed the triage vital signs and the nursing notes.   HISTORY  Chief Complaint Hip Pain and Fall    HPI Sierra Bentley is a 85 y.o. female presents for evaluation of pain after fall  Patient reports that she was getting her dog from the car, she tripped on the edge of her cement driveway falling landing directly onto her right hip region.  No nausea vomiting.  No fevers or chills.  Did not strike her head.  She reports pain in the area of the right hip.  Also reports a little bit of tenderness in the area of the right knee but reports that that is a daily pain and she has been getting steroid injections for this.  She reports all new pain is located along the area of her right hip.  No numbness tingling or weakness in the legs.  No other injury.  No back pain.   Past Medical History:  Diagnosis Date  . Allergic rhinitis, cause unspecified   . Cramp of limb   . Diverticulosis   . Epistaxis   . GERD (gastroesophageal reflux disease)   . HTN (hypertension)   . Osteopenia   . Other and unspecified hyperlipidemia   . Other malaise and fatigue   . Rheumatoid arthritis(714.0)   . Urine incontinence     Patient Active Problem List   Diagnosis Date Noted  . Tingling of both feet 10/07/2017  . Pedal edema 09/28/2017  . Fall at home 09/28/2017  . Prediabetes 11/30/2016  . Toenail fungus 10/03/2015  . At moderate risk for fall 09/05/2015  . Cervical spondylosis without myelopathy 08/05/2015  . Routine general medical examination at a health care facility 08/27/2014  . Colon cancer screening 08/27/2014  . Fall against object 08/15/2014  . Encounter for Medicare annual wellness exam 07/25/2013  . Varicosities of leg 05/23/2012  . Abnormal ultrasound of thyroid gland 03/01/2011   . Hyperlipidemia 06/11/2008  . ALLERGIC RHINITIS 03/29/2008  . Essential hypertension, benign 12/26/2006  . GERD 12/26/2006  . Rheumatoid arthritis ([place]) 12/26/2006  . Osteoporosis 12/26/2006  . URINARY INCONTINENCE 12/26/2006    Past Surgical History:  Procedure Laterality Date  . BREAST BIOPSY  2/11   fibrocystic change  . BREAST BIOPSY  8/11   fibrocystic change  . COLONOSCOPY  2003  . CYSTOSCOPY  2006  . MOLE REMOVAL     rectal  . TONSILLECTOMY      Prior to Admission medications   Medication Sig Start Date End Date Taking? Authorizing Provider  furosemide (LASIX) 20 MG tablet Take one pill by mouth daily for 5 days for swelling 09/28/17   Tower, [name], MD  losartan-hydrochlorothiazide (HYZAAR) 50-12.5 MG tablet Take 0.5 tablets by mouth daily. 12/02/17   Tower, [name], MD  Multiple Vitamin (MULTIVITAMIN) tablet Take 1 tablet by mouth daily.      [provider]  pantoprazole (PROTONIX) 40 MG tablet Take 40 mg by mouth daily as needed.  12/15/13   [provider]  Polyethyl Glycol-Propyl Glycol (SYSTANE ULTRA OP) Place 1 drop into both eyes 4 (four) times daily.      [provider]  sodium chloride (MURO 128) 5 % ophthalmic ointment Place 1 drop into both eyes at bedtime.     [provider]  TURMERIC PO Take  2 capsules by mouth daily before breakfast.    [provider]    Allergies Ace inhibitors; Alendronate sodium; Chocolate; Dust mite extract; and Ibandronate sodium  Family History  Problem Relation Age of Onset  . Heart attack Father 49  . Coronary artery disease Father   . Other Mother        Arrythmia  . Coronary artery disease Mother   . Hypertension Mother   . Anxiety disorder Mother   . Breast cancer Other        2nd cousin  . Depression Other        family history    Social History Social History   Tobacco Use  . Smoking status: Never Smoker  . Smokeless tobacco: Never Used  Substance Use Topics    . Alcohol use: No    Alcohol/week: 0.0 standard drinks  . Drug use: No    Review of Systems Constitutional: No fever/chills or recent illness Eyes: No visual changes. ENT:  No neck pain Cardiovascular: Denies chest pain. Respiratory: Denies shortness of breath. Gastrointestinal: No abdominal pain.   Musculoskeletal: Negative for back pain.  See HPI.  Denies any injury to the right arm or left side. Skin: Negative for rash. Neurological: Negative for headaches or areas weakness.    ____________________________________________   PHYSICAL EXAM:  VITAL SIGNS: ED Triage Vitals [02/25/18 2017]  Enc Vitals Group     BP (!) 135/50     Pulse Rate 90     Resp 18     Temp 97.6 F (36.4 C)     Temp src      SpO2 96 %     Weight 140 lb (63.5 kg)     Height 5' 5.75" (1.67 m)     Head Circumference      Peak Flow      Pain Score 8     Pain Loc      Pain Edu?      Excl. in [place]?     Constitutional: Alert and oriented. Well appearing and in no acute distress.  She is and her husband both very pleasant. Eyes: Conjunctivae are normal. Head: Atraumatic. Nose: No congestion/rhinnorhea. Mouth/Throat: Mucous membranes are moist. Neck: No stridor.  Cardiovascular: Normal rate, regular rhythm. Grossly normal heart sounds.  Good peripheral circulation. Respiratory: Normal respiratory effort.  No retractions. Lungs CTAB. Gastrointestinal: Soft and nontender. No distention. Musculoskeletal:  Upper extremities atraumatic bilateral.  Good use of both hands.  No pain.  Lower Extremities  No edema. Normal DP/PT pulses bilateral with good cap refill.  Normal neuro-motor function lower extremities bilateral.  RIGHT Right lower extremity demonstrates normal strength, good use of all muscles except notable limitation with any attempt to flex or extend at the right hip where it generates pain over her right pelvis and greater trochanter. No edema bruising or contusions of the right hip,  right knee, right ankle.  Some pain with axial loading. LEFT Left lower extremity demonstrates normal strength, good use of all muscles. No edema bruising or contusions of the hip,  knee, ankle. Full range of motion of the left lower extremity without pain. No pain on axial loading. No evidence of trauma.   Neurologic:  Normal speech and language. No gross focal neurologic deficits are appreciated.  Skin:  Skin is warm, dry and intact. No rash noted. Psychiatric: Mood and affect are normal. Speech and behavior are normal.  ____________________________________________   LABS (all labs ordered are listed, but only abnormal  results are displayed)  Labs Reviewed  GLUCOSE, CAPILLARY - Abnormal; Notable for the following components:      Result Value   Glucose-Capillary 106 (*)    All other components within normal limits  CBG MONITORING, ED   ____________________________________________  EKG   ____________________________________________  RADIOLOGY   IMPRESSION: 1. Acute fractures of the right puboacetabular junction and minimally displaced along the mid right inferior pubic ramus, radiographically occult due to projection. 2. Nondisplaced fracture of the anterior wall of the acetabulum. Electronically Signed   By: Ashley Royalty M.D.   On: 02/26/2018 03:50    IMPRESSION: 1. Soft tissue induration along the anteromedial aspect of the knee. 2. Moderate femorotibial and marked patellofemoral osteoarthritic joint space narrowing without joint effusion or acute appearing fracture. 3. A transverse lucency undermining the tibial spines on the AP view with sclerotic appearing margins may represent a cleft or stigmata of old remote trauma as opposed to a fracture given lack of joint effusion. Electronically Signed   By: Ashley Royalty M.D.   On: 02/25/2018     Imaging results discussed with Dr. Leim Fabry, he requests an MRI of the right  hip   ____________________________________________   PROCEDURES  Procedure(s) performed: None  Procedures  Critical Care performed: No  ____________________________________________   INITIAL IMPRESSION / ASSESSMENT AND PLAN / ED COURSE  Pertinent labs & imaging results that were available during my care of the patient were reviewed by me and considered in my medical decision making (see chart for details).   Patient presents after mechanical fall.  Injury to the right pelvic region.  She does have notable pain around the right hip joint.  CT scans demonstrate acetabular and pelvic injuries, but no obvious femoral involvement.  Discussed case with orthopedics, also discussed with the patient pain control options.  She reports being very nave to pain medication, would like to start with only oral hydrocodone.  This seems reasonable, and will obtain MRI of the right hip for which the patient is agreeable.  Wish to exclude fracture of the hip  No other obvious evidence of bony injury on clinical exam.  Does have right knee discomfort, but reports this is quite chronic in nature.  ----------------------------------------- 8:00 AM on 02/26/2018 ----------------------------------------- Ongoing care assigned to Dr. Quentin Cornwall.  Plan of care is to contact Dr. Posey Pronto once MRI results return, anticipate a probable need for admission due to pain control and noted pelvic fracture for which the patient is hemodynamically stable.  Would anticipate orthopedic consultation, probable admission unless otherwise advised after orthopedics evaluation.       ____________________________________________   FINAL CLINICAL IMPRESSION(S) / ED DIAGNOSES  Final diagnoses:  Acute right hip pain        Note:  This document was prepared using Dragon voice recognition software and may include unintentional dictation errors       Delman Kitten, MD 02/26/18 [phone]

## 2018-02-26 NOTE — Progress Notes (Signed)
Family Meeting Note  Advance Directive:no  Today a meeting took place with the Patient.spouse    The following clinical team members were present during this meeting:MD  The following were discussed:Patient's diagnosis: Nonoperable acetabular fracture, Patient's progosis: > 12 months and Goals for treatment: Full Code  Additional follow-up to be provided: [place] consultation to create advanced directives  Time spent during discussion: 16 minutes  Sky Borboa, MD

## 2018-02-26 NOTE — Clinical Social Work Note (Signed)
The CSW received consult for possible SNF placement. CSW will assess when able.  Santiago Bumpers, MSW, Latanya Presser [phone]

## 2018-02-26 NOTE — Progress Notes (Signed)
Provided and reviewed handoff of SNF with family, They do have a preference and would like ( mom and wife) to go to [facility].  LCSW explained HUB process and stated the weekday SW will follow up with bed offers.  Fl2 and assessment completed

## 2018-02-26 NOTE — ED Notes (Signed)
Patient transported to MRI 

## 2018-02-26 NOTE — NC FL2 (Signed)
[place] LEVEL OF CARE SCREENING TOOL     IDENTIFICATION  Patient Name: Sierra Bentley Birthdate: Mar 22, 1933 Sex: female Admission Date (Current Location): 02/26/2018  [place] and [place] Number:  Engineering geologist and Address:  [facility], [address] [place], [place] [address]      Provider Number: [id number]  Attending Physician Name and Address:  Bettey Costa, MD  Relative Name and Phone Number:  Mona Ayars (Spouse) [phone] or [phone]; Sybil Shrader, Brooke Bonito. (Son) [phone]    Current Level of Care: Hospital Recommended Level of Care: [place] Prior Approval Number:    Date Approved/Denied:   PASRR Number: [id number]  Discharge Plan: SNF    Current Diagnoses: Patient Active Problem List   Diagnosis Date Noted  . Inadequate pain control 02/26/2018  . Tingling of both feet 10/07/2017  . Pedal edema 09/28/2017  . Fall at home 09/28/2017  . Prediabetes 11/30/2016  . Toenail fungus 10/03/2015  . At moderate risk for fall 09/05/2015  . Cervical spondylosis without myelopathy 08/05/2015  . Routine general medical examination at a health care facility 08/27/2014  . Colon cancer screening 08/27/2014  . Fall against object 08/15/2014  . Encounter for Medicare annual wellness exam 07/25/2013  . Varicosities of leg 05/23/2012  . Abnormal ultrasound of thyroid gland 03/01/2011  . Hyperlipidemia 06/11/2008  . ALLERGIC RHINITIS 03/29/2008  . Essential hypertension, benign 12/26/2006  . GERD 12/26/2006  . Rheumatoid arthritis ([place]) 12/26/2006  . Osteoporosis 12/26/2006  . URINARY INCONTINENCE 12/26/2006    Orientation RESPIRATION BLADDER Height & Weight     Self, Time, Situation, Place  Normal Continent Weight: 140 lb (63.5 kg) Height:  5' 5.75" (167 cm)  BEHAVIORAL SYMPTOMS/MOOD NEUROLOGICAL BOWEL NUTRITION STATUS      Continent    AMBULATORY STATUS COMMUNICATION OF NEEDS Skin   Extensive Assist  Verbally Bruising                       Personal Care Assistance Level of Assistance  Bathing, Feeding, Dressing Bathing Assistance: Limited assistance Feeding assistance: Independent Dressing Assistance: Limited assistance     Functional Limitations Info  Sight, Hearing, Speech Sight Info: Adequate Hearing Info: Adequate Speech Info: Adequate    SPECIAL CARE FACTORS FREQUENCY  PT (By licensed PT)     PT Frequency: Up to 5X per week              Contractures Contractures Info: Not present    Additional Factors Info  Code Status, Allergies Code Status Info: Full Allergies Info:  Ace Inhibitors, Alendronate Sodium, Chocolate, Dust Mite Extract, Ibandronate Sodium           Current Medications (02/26/2018):  This is the current hospital active medication list Current Facility-Administered Medications  Medication Dose Route Frequency Provider Last Rate Last Dose  . acetaminophen (TYLENOL) tablet 650 mg  650 mg Oral Q6H PRN Bettey Costa, MD       Or  . acetaminophen (TYLENOL) suppository 650 mg  650 mg Rectal Q6H PRN Mody, Sital, MD      . bisacodyl (DULCOLAX) EC tablet 5 mg  5 mg Oral Daily PRN Mody, Sital, MD      . enoxaparin (LOVENOX) injection 40 mg  40 mg Subcutaneous Q24H Mody, Sital, MD      . losartan (COZAAR) tablet 50 mg  50 mg Oral Daily Cyndee Brightly M, RPH   50 mg at 02/26/18 1348   And  .  hydrochlorothiazide (MICROZIDE) capsule 12.5 mg  12.5 mg Oral Daily Cyndee Brightly M, RPH   12.5 mg at 02/26/18 1348  . HYDROcodone-acetaminophen (NORCO/VICODIN) 5-325 MG per tablet 1-2 tablet  1-2 tablet Oral Q4H PRN Bettey Costa, MD   1 tablet at 02/26/18 1335  . morphine 2 MG/ML injection 2 mg  2 mg Intravenous Q4H PRN Bettey Costa, MD      . multivitamin with minerals tablet 1 tablet  1 tablet Oral Daily Bettey Costa, MD   1 tablet at 02/26/18 1348  . ondansetron (ZOFRAN) tablet 4 mg  4 mg Oral Q6H PRN Mody, Sital, MD       Or  . ondansetron (ZOFRAN) injection 4  mg  4 mg Intravenous Q6H PRN Mody, Sital, MD      . polyethylene glycol (MIRALAX / GLYCOLAX) packet 17 g  17 g Oral Daily PRN Bettey Costa, MD         Discharge Medications: Please see discharge summary for a list of discharge medications.  Relevant Imaging Results:  Relevant Lab Results:   Additional Information [phone]  Zettie Pho, LCSW

## 2018-02-26 NOTE — Evaluation (Signed)
Physical Therapy Evaluation Patient Details Name: Sierra Bentley MRN: [id number] DOB: [birth year] Today's Date: 02/26/2018   History of Present Illness  Patient is 85 yo female presented to ED s/p fall, MRI did not show hip fracture but she has a minimally displaced anterior wall acetabular fracture, root of the ramus fracture and inferior pubic rami fracture on the right side. Patient can be FFWB on right lower extremity per ortho. PMH of GERD, HTN, osteopenia, RA    Clinical Impression  Pt A&Ox4 at start of session, reported no RLE at rest, reported she has significant pain with movement. Patient lives with husband in one story home, husband unable to provide adequate physical assist. Previously pt independent, though endorses several falls in the last 6 months.   Patient able to perform therapeutic exercises with encouragement and physical assist for RLE. Bed mobility with minAx1, able to sit EOB with fair balance. Sit <> Stand with RW and min/modAx1. Able to ambulate 52ft to chair with modAx1 from PT throughout to maintain weight bearing precautions. Step by step instruction for sequencing, weight bearing status and safety throughout transfers and ambulation.  Pt with fair-good ability to adhere throughout mobility. Unable to maintain balance without min-modAx1 Overall the patient demonstrated deficits (see "PT Problem List") that impede the patient's functional abilities, safety, and mobility and would benefit from skilled PT intervention. Recommendation is STR due to inaccessible home environment, decreased care giver support and current level of assistance needed.     Follow Up Recommendations SNF    Equipment Recommendations  Other (comment)(TBD at next venue of care)    Recommendations for Other Services       Precautions / Restrictions Precautions Precautions: Fall Precaution Comments: pelvic fx Restrictions Weight Bearing Restrictions: Yes RLE Weight Bearing: ((per ortho, FFWB))       Mobility  Bed Mobility Overal bed mobility: Needs Assistance Bed Mobility: Supine to Sit     Supine to sit: Min assist     General bed mobility comments: for LE management, trunk elevation. Able to scoot towards EOB with supervision  Transfers Overall transfer level: Needs assistance Equipment used: Rolling walker (2 wheeled) Transfers: Sit to/from Stand Sit to Stand: Mod assist;Min assist         General transfer comment: attempted x2, better adherence to weight bearing precautions with mod Ax1, verbal/visual cues throughout for sequencing/safety.  Ambulation/Gait Ambulation/Gait assistance: Mod assist Gait Distance (Feet): 2 Feet Assistive device: Rolling walker (2 wheeled)   Gait velocity: decreased   General Gait Details: shuffling step, verbal/visual cues to utilize UE to adhere to weight bearing precautions good-fair ability to maintain. Pt with significant pain. Unable to maintain balance without physical assist  Stairs            Wheelchair Mobility    Modified Rankin (Stroke Patients Only)       Balance Overall balance assessment: Needs assistance Sitting-balance support: Feet supported Sitting balance-Leahy Scale: Fair       Standing balance-Leahy Scale: Poor                               Pertinent Vitals/Pain Pain Assessment: Faces Faces Pain Scale: Hurts whole lot Pain Location: reported 10/10 pain with mobility this session Pain Descriptors / Indicators: Grimacing;Moaning;Constant Pain Intervention(s): Limited activity within patient's tolerance;Repositioned;Ice applied;Monitored during session;Premedicated before session    Home Living Family/patient expects to be discharged to:: Private residence Living Arrangements: Spouse/significant other Available Help  at Discharge: Family;Other (Comment)(husband unable to provide physical assist) Type of Home: House Home Access: Stairs to enter Entrance Stairs-Rails:  None Entrance Stairs-Number of Steps: 2 Home Layout: One level Home Equipment: Cane - single point;Walker - 2 wheels;Bedside commode;Shower seat      Prior Function Level of Independence: Independent         Comments: Pt endorses at least 3-4 falls in the last 6 months. States it occurs when she moves her head/looks around     Hand Dominance        Extremity/Trunk Assessment   Upper Extremity Assessment Upper Extremity Assessment: Overall WFL for tasks assessed    Lower Extremity Assessment Lower Extremity Assessment: Difficult to assess due to impaired cognition;RLE deficits/detail;LLE deficits/detail RLE Deficits / Details: pt able to perform heel slides and SLR with very little physical assist, painful LLE Deficits / Details: grossly 4/5       Communication   Communication: No difficulties  Cognition Arousal/Alertness: Awake/alert Behavior During Therapy: WFL for tasks assessed/performed Overall Cognitive Status: Within Functional Limits for tasks assessed                                        General Comments      Exercises Total Joint Exercises Ankle Circles/Pumps: AROM;Both;10 reps Quad Sets: AROM;Right;10 reps Heel Slides: AAROM;Right;10 reps;AROM;Left Straight Leg Raises: AROM;Left;10 reps;AAROM;Right   Assessment/Plan    PT Assessment Patient needs continued PT services  PT Problem List Decreased strength;Pain;Decreased range of motion;Decreased activity tolerance;Decreased knowledge of use of DME;Decreased balance;Decreased safety awareness;Decreased mobility;Decreased knowledge of precautions       PT Treatment Interventions DME instruction;Therapeutic exercise;Gait training;Balance training;Stair training;Neuromuscular re-education;Functional mobility training;Therapeutic activities;Patient/family education    PT Goals (Current goals can be found in the Care Plan section)  Acute Rehab PT Goals Patient Stated Goal: To get her  strength back PT Goal Formulation: With patient Time For Goal Achievement: 03/12/18 Potential to Achieve Goals: Good    Frequency Min 2X/week   Barriers to discharge Decreased caregiver support;Inaccessible home environment      Co-evaluation               AM-PAC PT "6 Clicks" Mobility  Outcome Measure Help needed turning from your back to your side while in a flat bed without using bedrails?: A Little Help needed moving from lying on your back to sitting on the side of a flat bed without using bedrails?: A Little Help needed moving to and from a bed to a chair (including a wheelchair)?: A Little Help needed standing up from a chair using your arms (e.g., wheelchair or bedside chair)?: A Lot Help needed to walk in hospital room?: A Lot Help needed climbing 3-5 steps with a railing? : Total 6 Click Score: 14    End of Session Equipment Utilized During Treatment: Gait belt Activity Tolerance: Patient limited by pain Patient left: in chair;with chair alarm set;with SCD's reapplied;with call bell/phone within reach Nurse Communication: Mobility status PT Visit Diagnosis: Difficulty in walking, not elsewhere classified (R26.2);Other abnormalities of gait and mobility (R26.89);Muscle weakness (generalized) (M62.81);History of falling (Z91.81);Pain Pain - Right/Left: Right Pain - part of body: Hip    Time: 9381-0175 PT Time Calculation (min) (ACUTE ONLY): 34 min   Charges:   PT Evaluation $PT Eval Low Complexity: 1 Low PT Treatments $Therapeutic Exercise: 8-22 mins       Lieutenant Diego PT,  DPT 4:57 PM,02/26/18 [phone]

## 2018-02-27 DIAGNOSIS — Z8781 Personal history of (healed) traumatic fracture: Secondary | ICD-10-CM | POA: Diagnosis present

## 2018-02-27 DIAGNOSIS — S72001A Fracture of unspecified part of neck of right femur, initial encounter for closed fracture: Secondary | ICD-10-CM | POA: Diagnosis present

## 2018-02-27 MED ORDER — POLYETHYLENE GLYCOL 3350 17 G PO PACK: 17.0000 g | PACK | Freq: Every day | ORAL | Status: DC

## 2018-02-27 MED ORDER — POLYETHYLENE GLYCOL 3350 17 G PO PACK: 17.0000 g | PACK | Freq: Two times a day (BID) | ORAL | Status: DC

## 2018-02-27 MED ORDER — POLYETHYLENE GLYCOL 3350 17 G PO PACK: 17.0000 g | PACK | Freq: Once | ORAL | Status: DC

## 2018-02-27 MED ORDER — IBUPROFEN 400 MG PO TABS
600.0000 mg | ORAL_TABLET | Freq: Four times a day (QID) | ORAL | Status: DC
  Administered 2018-02-27 – 2018-03-01 (×10): 600 mg via ORAL
  Filled 2018-02-27 (×10): qty 2

## 2018-02-27 MED ORDER — POLYETHYLENE GLYCOL 3350 17 G PO PACK: 17.0000 g | PACK | Freq: Every day | ORAL | Status: DC | PRN

## 2018-02-27 NOTE — Discharge Summary (Signed)
[place] [facility] [place] NAME: Tiasha Helvie    MR#:  [id number]  DATE OF BIRTH:  09/01/1933  DATE OF ADMISSION:  02/26/2018   ADMITTING PHYSICIAN: Bettey Costa, MD  DATE OF DISCHARGE: 03/01/2018  PRIMARY CARE PHYSICIAN: Tower, [name], MD   ADMISSION DIAGNOSIS:  Acute right hip pain [M25.551] DISCHARGE DIAGNOSIS:  Principal Problem:   Hip fracture, right ([place])  SECONDARY DIAGNOSIS:   Past Medical History:  Diagnosis Date  . Allergic rhinitis, cause unspecified   . Cramp of limb   . Diverticulosis   . Epistaxis   . GERD (gastroesophageal reflux disease)   . HTN (hypertension)   . Osteopenia   . Other and unspecified hyperlipidemia   . Other malaise and fatigue   . Rheumatoid arthritis(714.0)   . Urine incontinence    HOSPITAL COURSE:   Mrs. Abbygail Willhoite is a 85 year old female with history of GERD, hypertension, osteopenia, and rheumatoid arthritis who suffered a mechanical fall at home and now has a right minimally displaced anterior wall acetabular fracture, root of the rami fracture and inferior pubic rami fracture on the right side. There was also an acute right sacral ala fracture on imaging. Non-displaced. High left superior pubic ramus and left inferior pubic ramus with mild-to-moderate edema consistent with acute or late subacute injury. Non-surgical management per orthopedic surgery recs.   Admitted for pain control, mobility evaluation, and rehab facility placement. Placement to [facility] and pain controlled on OTC meds.  1. Right minimally displaced anterior wall acetabular fracture, root of the rami fracture and inferior pubic rami fracture on the right side.  - Patient evaluated by orthopedic surgery. Given minimally displaced nature of these fractures nonsurgical management was been recommended by Dr. Posey Pronto; patient can be foot flat weight bearing (FFWB)on right lower extremity. Full weight bearing on left. 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 Overall not thought to be a safe candidate for discharge home at this time).  - Transitioned to PO pain medication PRN; ordered Ibuprofen PRN, Tylenol PRN, Norco for extreme pain but preferred not to continue to use narcotics Required 2 doses Norco on 2/2, none on 2/3 or 2/4. Continue OTC pain control as needed. No narcotics sent.  2. Essential hypertension: Continue Hyzaar  3. Constipation: PRN stool softeners, PRN laxative given on 2/2. Resolved. Continue to take stool softeners as needed, encouraged oral hydration, activity as tolerated to help maintain regular bowel habits.  4. GERD complained of one episode of reflux 2/5, resolved with Protonix given. continue home medications  DISCHARGE CONDITIONS:  Stable, non-surgical fracture CONSULTS OBTAINED:  Treatment Team:  Leim Fabry, MD DRUG ALLERGIES:   Allergies  Allergen Reactions  . Ace Inhibitors     REACTION: cough  . Alendronate Sodium     REACTION: reflux, trouble swallowing  . Chocolate   . Dust Mite Extract   . Ibandronate Sodium     REACTION: reflux and increased BP   DISCHARGE MEDICATIONS:   Allergies as of 03/01/2018      Reactions   Ace Inhibitors    REACTION: cough   Alendronate Sodium    REACTION: reflux, trouble swallowing   Chocolate    Dust Mite Extract    Ibandronate Sodium    REACTION: reflux and increased BP      Medication List    STOP taking these  medications   furosemide 20 MG tablet Commonly known as:  LASIX     TAKE these medications   acetaminophen 325 MG tablet Commonly known as:  TYLENOL Take 2 tablets (650 mg total) by mouth every 6 (six) hours as needed for mild pain (or Fever >/= 101).   ibuprofen 600 MG tablet Commonly known as:  ADVIL,MOTRIN Take 1 tablet (600 mg total) by mouth 4 (four) times daily.     losartan-hydrochlorothiazide 50-12.5 MG tablet Commonly known as:  HYZAAR Take 0.5 tablets by mouth daily. What changed:  when to take this   multivitamin tablet Take 1 tablet by mouth daily.   pantoprazole 40 MG tablet Commonly known as:  PROTONIX Take 40 mg by mouth every evening.   sodium chloride 5 % ophthalmic ointment Commonly known as:  MURO 128 Place 1 drop into both eyes at bedtime.   SYSTANE ULTRA OP Place 1 drop into both eyes 4 (four) times daily.   TURMERIC PO Take 2 capsules by mouth daily before breakfast.        DISCHARGE INSTRUCTIONS:   DIET:  Regular diet DISCHARGE CONDITION:  Stable ACTIVITY:  Activity as tolerated OXYGEN:  Home Oxygen: No.  Oxygen Delivery: room air DISCHARGE LOCATION:  nursing home;   If you experience worsening of your admission symptoms, develop shortness of breath, life threatening emergency, suicidal or homicidal thoughts you must seek medical attention immediately by calling 911 or calling your MD immediately if your symptoms are severe.  You Must read complete instructions/literature along with all the possible adverse reactions/side effects for all the medicines you take and that have been prescribed to you. Take any new medicines only after you have completely understood and accept all the possible adverse reactions/side effects.   Please note  You were cared for by a hospitalist during your hospital stay. If you have any questions about your discharge medications or the care you received while you were in the hospital after you are discharged, you can call the unit and asked to speak with the hospitalist on call if the hospitalist that took care of you is not available. Once you are discharged, your primary care physician will handle any further medical issues. Please note that NO REFILLS for any discharge medications will be authorized once you are discharged, as it is imperative that you return to your primary care  physician (or establish a relationship with a primary care physician if you do not have one) for your aftercare needs so that they can reassess your need for medications and monitor your lab values.    On the day of Discharge:  VITAL SIGNS:  Blood pressure 96/60, pulse 86, temperature 98.9 F (37.2 C), resp. rate 15, height 5' 5.75" (1.67 m), weight 63.5 kg, SpO2 96 %. PHYSICAL EXAMINATION:  Physical Exam Constitutional:  General: She is not in acute distress. HENT:  Head: Normocephalic.  Eyes:  General: No scleral icterus. Neck:  Musculoskeletal: Normal range of motionand neck supple.  Vascular: No JVD.  Trachea: No tracheal deviation.  Cardiovascular:  Rate and Rhythm: Normal rateand regular rhythm.  Heart sounds: Normal heart sounds.No murmur. Nofriction rub. Nogallop.  Pulmonary:  Effort: Pulmonary effort is normal. Norespiratory distress.  Breath sounds: Normal breath sounds. Nowheezingor rales.  Chest:  Chest wall: No tenderness.  Abdominal:  General: Bowel sounds are normal. There is nodistension.  Palpations: Abdomen is soft. There is nomass.  Tenderness: There is no abdominal tenderness. There is noguardingor rebound.  Musculoskeletal:  Comments:Right  legstrength limiteddue to pain, hip ROM limited due to pain. No deficits in upper extremities or left lower extremity. Skin: General: Skin is warm.  Findings: No erythemaor rash.Ecchymosis at R elbowwith central abrasion, healing. Few minor abrasions R hand palmar surface. No abnormalities seen at R hip and no tenderness to palpation. No bed sores/skin breakdown on rear. Neurological:  Mental Status: She is alertand oriented to person, place, and time.  Psychiatric:  Judgment: Judgmentnormal.  DATA REVIEW:   CBC Recent Labs  Lab 02/26/18 0942  WBC 9.3  HGB 12.1  HCT 38.0  PLT 213    Chemistries  Recent Labs  Lab  02/26/18 0942  NA 139  K 3.6  CL 103  CO2 31  GLUCOSE 102*  BUN 12  CREATININE 0.68  CALCIUM 9.2     Microbiology Results  Results for orders placed or performed in visit on 09/05/14  Fecal occult blood, imunochemical     Status: None   Collection Time: 09/05/14  5:45 PM  Result Value Ref Range Status   Fecal Occult Bld Negative Negative Final    RADIOLOGY:   Ct Hip Right Wo Contrast  Result Date: 02/26/2018 CLINICAL DATA: Hip pain after fall. EXAM: CT OF THE RIGHT HIP WITHOUT CONTRAST TECHNIQUE: Multidetector CT imaging of the right hip was performed according to the standard protocol. Multiplanar CT image reconstructions were also generated. COMPARISON: 02/25/2018 radiographs of the pelvis and right hip.  IMPRESSION: 1. Acute fractures of the right puboacetabular junction and minimally displaced along the mid right inferior pubic ramus, radiographically occult due to projection. 2. Nondisplaced fracture of the anterior wall of the acetabulum. Electronically Signed By: Ashley Royalty M.D. On: 02/26/2018 03:50   Mr Hip Right Wo Contrast  Result Date: 02/26/2018 CLINICAL DATA: Right hip pain due to a fall today. Initial encounter. EXAM: MR OF THE RIGHT HIP WITHOUT CONTRAST TECHNIQUE: Multiplanar, multisequence MR imaging was performed. No intravenous contrast was administered. COMPARISON: CT right hip and plain films right hip 02/26/2018.  IMPRESSION: This examination is positive for acute high right superior pubic ramus and right inferior pubic ramus fractures. There is also an acute right sacral ala fracture. The fractures are nondisplaced. High left superior pubic ramus and left inferior pubic ramus fractures with mild-to-moderate edema consistent with acute or late subacute injury. Mild marrow edema in the left sacrum is also consistent with a late subacute fracture. These fractures are nondisplaced. Negative for hip fracture. Right greater than left edema in the adductor  musculature is consistent with strain or contusion. No tear. Electronically Signed By: Inge Rise M.D. On: 02/26/2018 09:13   Dg Knee Complete 4 Views Right  Result Date: 02/25/2018 CLINICAL DATA: Encounter gall fall today. Difficulty weight-bearing. EXAM: RIGHT KNEE - COMPLETE 4+ VIEW COMPARISON: 09/15/2017  . IMPRESSION: 1. Soft tissue induration along the anteromedial aspect of the knee. 2. Moderate femorotibial and marked patellofemoral osteoarthritic joint space narrowing without joint effusion or acute appearing fracture. 3. A transverse lucency undermining the tibial spines on the AP view with sclerotic appearing margins may represent a cleft or stigmata of old remote trauma as opposed to a fracture given lack of joint effusion. Electronically Signed By: Ashley Royalty M.D. On: 02/25/2018 21:43   Dg Hand Complete Right  Result Date: 02/25/2018 CLINICAL DATA: Right hand pain after fall. EXAM: RIGHT HAND - COMPLETE 3+ VIEW COMPARISON: None.  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 No acute osseous abnormality. Electronically Signed By: Shanon Brow  Randel Pigg M.D. On: 02/25/2018 21:36   Dg Hip Unilat With Pelvis 2-3 Views Right  Result Date: 02/25/2018 CLINICAL DATA: Right-sided pain after mechanical fall today. EXAM: DG HIP (WITH OR WITHOUT PELVIS) 2-3V RIGHT COMPARISON: None. IMPRESSION: 1. No acute osseous abnormality of the bony pelvis and right hip. 2. Lower lumbar facet arthropathy. 3. Osteoarthritis of the right SI joint. Electronically Signed By: Ashley Royalty M.D. On: 02/25/2018 21:31      Management plans discussed with the patient and family and they are in agreement.  CODE STATUS: Full Code   TOTAL TIME TAKING CARE OF THIS PATIENT: 45 minutes.    Ripley Fraise PA-C on 03/01/2018 at 2:41 PM   [facility]  Office  [phone]  CC: Primary care physician; Tower, [name], MD

## 2018-02-27 NOTE — Care Management Obs Status (Signed)
[place] NOTIFICATION   Patient Details  Name: Sierra Bentley MRN: [id number] Date of Birth: 1933/02/24   Medicare Observation Status Notification Given:  Yes    Elza Rafter, RN 02/27/2018, 4:15 PM

## 2018-02-27 NOTE — Progress Notes (Signed)
[facility] NAME: Duane Trias    MR#:  [id number]  DATE OF BIRTH:  [birth year]  SUBJECTIVE:  CHIEF COMPLAINT:   Chief Complaint  Patient presents with  . Hip Pain  . Fall   No pain at rest today, pain with movement and bearing weight. Would prefer not to use narcotic for pain control, wants to trial an anti-inflammatory today. Working with PT on using a walker to keep weight off of R leg when ambulating and to use commode. Has not had a BM in 3 days.   No chest pain, shortness of breath, or coldness or paresthesias in R extremity.   REVIEW OF SYSTEMS:  Review of Systems  Constitutional: Negative.  Negative for chills, fever and malaise/fatigue.  HENT: Negative.  Negative for ear discharge, ear pain, hearing loss, nosebleeds and sore throat.   Eyes: Negative.  Negative for blurred vision and pain.  Respiratory: Negative.  Negative for cough, hemoptysis, shortness of breath and wheezing.   Cardiovascular: Negative.  Negative for chest pain, palpitations and leg swelling.  Gastrointestinal: Positive for constipation.  Negative for abdominal pain, blood in stool, diarrhea, nausea and vomiting.  Genitourinary: Negative.  Negative for dysuria.  Musculoskeletal: Negative for back pain.       Right leg pain after fall, with movement only  Skin: Negative.   Neurological: Negative for dizziness, tremors, speech change, focal weakness, seizures and headaches.  Endo/Heme/Allergies: Negative.  Does not bruise/bleed easily.  Psychiatric/Behavioral: Negative.  Negative for depression, hallucinations and suicidal ideas.   DRUG ALLERGIES:   Allergies  Allergen Reactions  . Ace Inhibitors     REACTION: cough  . Alendronate Sodium     REACTION: reflux, trouble swallowing  . Chocolate   . Dust Mite Extract   . Ibandronate Sodium     REACTION: reflux and increased BP   VITALS:  Blood pressure (!) 110/52, pulse 99, temperature 98.1 F  (36.7 C), temperature source Oral, resp. rate 19, height 5' 5.75" (1.67 m), weight 63.5 kg, SpO2 92 %. PHYSICAL EXAMINATION:  Physical Exam Constitutional:      General: She is not in acute distress. HENT:     Head: Normocephalic.  Eyes:     General: No scleral icterus. Neck:     Musculoskeletal: Normal range of motion and neck supple.     Vascular: No JVD.     Trachea: No tracheal deviation.  Cardiovascular:     Rate and Rhythm: Normal rate and regular rhythm.     Heart sounds: Normal heart sounds. No murmur. No friction rub. No gallop.   Pulmonary:     Effort: Pulmonary effort is normal. No respiratory distress.     Breath sounds: Normal breath sounds. No wheezing or rales.  Chest:     Chest wall: No tenderness.  Abdominal:     General: Bowel sounds are normal. There is no distension.     Palpations: Abdomen is soft. There is no mass.     Tenderness: There is no abdominal tenderness. There is no guarding or rebound.  Musculoskeletal:     Comments: Right leg movement limited due to pain. Skin:    General: Skin is warm.     Findings: No erythema or rash. Ecchymosis at R elbow with central abrasion. Few minor abrasions R hand palmar surface. Neurological:     Mental Status: She is alert and oriented to person, place, and time.  Psychiatric:  Judgment: Judgment normal.  LABORATORY PANEL:  Female CBC Recent Labs  Lab 02/26/18 0942  WBC 9.3  HGB 12.1  HCT 38.0  PLT 213   ------------------------------------------------------------------------------------------------------------------ Chemistries  Recent Labs  Lab 02/26/18 0942  NA 139  K 3.6  CL 103  CO2 31  GLUCOSE 102*  BUN 12  CREATININE 0.68  CALCIUM 9.2   RADIOLOGY:   Ct Hip Right Wo Contrast  Result Date: 02/26/2018 CLINICAL DATA:  Hip pain after fall. EXAM: CT OF THE RIGHT HIP WITHOUT CONTRAST TECHNIQUE: Multidetector CT imaging of the right hip was performed according to the standard protocol.  Multiplanar CT image reconstructions were also generated. COMPARISON:  02/25/2018 radiographs of the pelvis and right hip. FINDINGS: Bones/Joint/Cartilage Acute fractures at the right puboacetabular junction and minimally displaced along the mid right inferior pubic ramus, radiographically occult due to projection. The femoral head is seated within its acetabular component. Faint linear lucencies of the anterior wall of the acetabulum consistent with a nondisplaced fracture is also identified. No fracture of the proximal femur. No joint effusion. Ligaments Suboptimally assessed by CT. Muscles and Tendons No intramuscular hemorrhage. There is mild posttraumatic edema the abductor and obturator internus muscles due to inferior pubic ramus fracture. Soft tissues Mild soft tissue contusion along the posterolateral aspect of the head. IMPRESSION: 1. Acute fractures of the right puboacetabular junction and minimally displaced along the mid right inferior pubic ramus, radiographically occult due to projection. 2. Nondisplaced fracture of the anterior wall of the acetabulum. Electronically Signed   By: Ashley Royalty M.D.   On: 02/26/2018 03:50   Mr Hip Right Wo Contrast  Result Date: 02/26/2018 CLINICAL DATA:  Right hip pain due to a fall today. Initial encounter. EXAM: MR OF THE RIGHT HIP WITHOUT CONTRAST TECHNIQUE: Multiplanar, multisequence MR imaging was performed. No intravenous contrast was administered. COMPARISON:  CT right hip and plain films right hip 02/26/2018. FINDINGS: Bones: A seen on the prior CT scan, the patient has nondisplaced fractures of the right inferior pubic ramus and high right superior pubic ramus with associated marrow edema consistent with acute or subacute injury. Also seen are fractures of the high left superior pubic ramus and left inferior pubic ramus with a milder degree of marrow edema consistent with more subacute injury. The patient also has a nondisplaced right sacral ala fracture  with associated intense marrow edema. A milder degree of marrow edema is seen in the left sacrum consistent with more subacute fracture. The hip is located and there is no hip fracture. Articular cartilage and labrum Articular cartilage:  Mildly degenerated. Labrum:  Degenerated without focal tear. Joint or bursal effusion Joint effusion:  None. Bursae: Negative. Muscles and tendons Muscles and tendons: Intact. There is some edema in the adductor musculature bilaterally, more intense on the right, compatible with strain or contusion related to the patient's fall. Other findings Miscellaneous: Imaged intrapelvic contents demonstrate no acute abnormality. IMPRESSION: This examination is positive for acute high right superior pubic ramus and right inferior pubic ramus fractures. There is also an acute right sacral ala fracture. The fractures are nondisplaced. High left superior pubic ramus and left inferior pubic ramus fractures with mild-to-moderate edema consistent with acute or late subacute injury. Mild marrow edema in the left sacrum is also consistent with a late subacute fracture. These fractures are nondisplaced. Negative for hip fracture. Right greater than left edema in the adductor musculature is consistent with strain or contusion. No tear. Electronically Signed   By: Marcello Moores  Dalessio M.D.   On: 02/26/2018 09:13   Dg Knee Complete 4 Views Right  Result Date: 02/25/2018 CLINICAL DATA:  Encounter gall fall today. Difficulty weight-bearing. EXAM: RIGHT KNEE - COMPLETE 4+ VIEW COMPARISON:  09/15/2017 FINDINGS: Moderate femorotibial and marked patellofemoral joint space narrowing without joint effusion or acute appearing fracture. A transverse lucency undermining the tibial spines on the AP view with sclerotic appearing margins may represent a cleft as opposed to a fracture given lack of joint effusion. Mild soft tissue induration is noted along the anteromedial aspect of the knee. No joint dislocation is  identified. Osteoarthritic spurring the tibial spines. IMPRESSION: 1. Soft tissue induration along the anteromedial aspect of the knee. 2. Moderate femorotibial and marked patellofemoral osteoarthritic joint space narrowing without joint effusion or acute appearing fracture. 3. A transverse lucency undermining the tibial spines on the AP view with sclerotic appearing margins may represent a cleft or stigmata of old remote trauma as opposed to a fracture given lack of joint effusion. Electronically Signed   By: Ashley Royalty M.D.   On: 02/25/2018 21:43   Dg Hand Complete Right  Result Date: 02/25/2018 CLINICAL DATA:  Right hand pain after fall. EXAM: RIGHT HAND - COMPLETE 3+ VIEW COMPARISON:  None. FINDINGS: Mix of inflammatory and erosive osteoarthritis is noted of the right hand. There is an ankylosed appearance of the right fifth DIP joint. Erosive osteoarthritic change with marginal erosions are identified of the second and third DIP and second PIP joints with mild soft tissue swelling. Moderate joint space narrowing of the third through fifth PIP joints with small extra-articular and marginal erosions noted of the fifth digit and marginal erosions of the fourth digit. Marginal erosion of the base of the right second proximal phalanx is also identified. Osteoarthritis of the interphalangeal joint of thumb. Carpal rows are maintained with osteoarthritic joint space narrowing at base of the thumb metacarpal and triscaphe joint wrist. Subcortical cysts or erosions of the ulna are identified. 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 No acute osseous abnormality. Electronically Signed   By: Ashley Royalty M.D.   On: 02/25/2018 21:36   Dg Hip Unilat  With Pelvis 2-3 Views Right  Result Date: 02/25/2018 CLINICAL DATA:  Right-sided pain after mechanical fall today. EXAM: DG HIP (WITH OR WITHOUT PELVIS) 2-3V RIGHT COMPARISON:  None. FINDINGS: L4-5 and L5-S1 facet  arthropathy is seen. Osteoarthritis of the right SI joint relative to left. No pelvic diastasis or fracture. Slight joint space narrowing of the left hip relative to right. No acute fracture of the proximal femora. No joint dislocation is seen. Soft tissues are largely unremarkable in appearance. IMPRESSION: 1. No acute osseous abnormality of the bony pelvis and right hip. 2. Lower lumbar facet arthropathy. 3. Osteoarthritis of the right SI joint. Electronically Signed   By: Ashley Royalty M.D.   On: 02/25/2018 21:31    ASSESSMENT AND PLAN:   Mrs. Vigeant is a 85 year old female with history of hypertension who suffered a mechanical fall and now has a right minimally displaced anterior wall acetabular fracture, root of the rami fracture and inferior pubic rami fracture on the right side. Non-surgical. Here for pain control pending SNF ([facility]) at d/c.  1. Right minimally displaced anterior wall acetabular fracture, root of the rami fracture and inferior pubic rami fracture on the right side. Patient evaluated by orthopedic surgery. Given minimally displaced nature of these fractures nonsurgical management has been recommended by Dr. Posey Pronto. Patient  can be [facility] on right lower extremity, currently just limited by pain Physical therapy c/s and seeing patient: recommendation for SNF and patient and family have preference for [facility].  Transitioned to PO pain medication. Ordered Ibuprofen PRN, has Tylenol PRN, Norco for extreme pain but would prefer not to use. Monitor response with non-narcotics tomorrow.  2.  Essential hypertension: Continue Hyzaar  3. Constipation: PRN stool softeners, laxative given today. Will monitor.  All the records are reviewed and case is discussed with Care Management/Social Worker. Management plans discussed with the patient and/or family and they are in agreement.  CODE STATUS: Full Code  TOTAL TIME TAKING CARE OF THIS PATIENT: 30 minutes.   More than 50% of the time  was spent in counseling/coordination of care: YES  POSSIBLE D/C IN 1 DAY, DEPENDING ON CLINICAL CONDITION; pain control  Ripley Fraise PA-C on 02/27/2018 at 12:21 PM   [place]  [phone]  CC: Primary care physician; Tower, [name], MD

## 2018-02-27 NOTE — Care Management CC44 (Signed)
Condition Code 44 Documentation Completed  Patient Details  Name: SHANAVIA MAKELA MRN: [id number] Date of Birth: 1933/02/06   Condition Code 44 given:  Yes Patient signature on Condition Code 44 notice:  Yes Documentation of 2 MD's agreement:  Yes Code 44 added to claim:  Yes    Elza Rafter, RN 02/27/2018, 4:15 PM

## 2018-02-27 NOTE — Clinical Social Work Note (Signed)
CSW extended bed offers to patient and patient did not want to consider any offers as she wanted [facility]. CSW explained that [facility] was not able to offer at this time. CSW explained that she could look over the bed offers and choose a second choice by the morning if [facility] is still not able to offer in the morning. CSW will call [facility] in the morning. Prior Josem Kaufmann will need to be started with medicare [employer]. Shela Leff MSW,LCSW [phone]

## 2018-02-28 MED ORDER — SODIUM CHLORIDE (HYPERTONIC) 5 % OP OINT
TOPICAL_OINTMENT | Freq: Every day | OPHTHALMIC | Status: DC
  Administered 2018-02-28: 22:00:00 via OPHTHALMIC
  Filled 2018-02-28: qty 3.5

## 2018-02-28 MED ORDER — GUAIFENESIN-DM 100-10 MG/5ML PO SYRP
5.0000 mL | ORAL_SOLUTION | ORAL | Status: DC | PRN
Start: 2018-02-28 — End: 2018-03-01

## 2018-02-28 MED ORDER — PANTOPRAZOLE SODIUM 40 MG PO TBEC
40.0000 mg | DELAYED_RELEASE_TABLET | Freq: Every day | ORAL | Status: DC
  Administered 2018-02-28 – 2018-03-01 (×2): 40 mg via ORAL
  Filled 2018-02-28 (×2): qty 1

## 2018-02-28 MED ORDER — ALUM & MAG HYDROXIDE-SIMETH 200-200-20 MG/5ML PO SUSP
30.0000 mL | ORAL | Status: DC | PRN
  Filled 2018-02-28: qty 30

## 2018-02-28 NOTE — Progress Notes (Addendum)
[place] [facility] [place] NAME: Sierra Bentley    MR#:  [id number]  DATE OF BIRTH:  January 27, 1933  SUBJECTIVE:  CHIEF COMPLAINT:   Chief Complaint  Patient presents with  . Hip Pain  . Fall   Seen while resting in bed. No pain at rest and no complaints, ibuprofen has been controlling pain well and helping her to shift position in bed with less pain. awaiting placement to [facility] tomorrow. Had 1 BM today, was previously constipated. Patient states she has not been up out of bed since seeing PT 2 days ago and believes that she was told not to bear weight on her R leg at all.   REVIEW OF SYSTEMS:  Review of Systems  Constitutional:Negative. Negative for chills,feverand malaise/fatigue.  HENT:Negative. Negative for ear discharge,ear pain,hearing loss,nosebleedsand sore throat.  Eyes:Negative. Negative for blurred visionand pain.  Respiratory:Negative. Negative for cough,hemoptysis,shortness of breathand wheezing.  Cardiovascular:Negative. Negative for chest pain,palpitationsand leg swelling.  Gastrointestinal: Negative for abdominal pain,blood in stool,diarrhea,constipation, nauseaand vomiting.  Genitourinary:Negative. Negative for dysuria.  Musculoskeletal: Negative forback pain. Right leg pain after fall, with movement only. Skin:Negative.  Neurological: Negative fordizziness,tremors,speech change,focal weakness,seizuresand headaches.  Endo/Heme/Allergies: Positive for bruising (elbow) after fall.Does not bruise/bleed easily.  Psychiatric/Behavioral:Negative. Negative for depression,hallucinationsand suicidal ideas.  DRUG ALLERGIES:   Allergies  Allergen Reactions  . Ace Inhibitors     REACTION: cough  . Alendronate Sodium     REACTION: reflux, trouble swallowing  . Chocolate   . Dust Mite Extract   . Ibandronate Sodium     REACTION: reflux and increased BP   VITALS:  Blood  pressure 115/61, pulse 85, temperature 97.8 F (36.6 C), temperature source Oral, resp. rate 20, height 5' 5.75" (1.67 m), weight 63.5 kg, SpO2 96 %. PHYSICAL EXAMINATION:  Physical Exam Constitutional:  General: She is not in acute distress. HENT:  Head: Normocephalic.  Eyes:  General: No scleral icterus. Neck:  Musculoskeletal: Normal range of motionand neck supple.  Vascular: No JVD.  Trachea: No tracheal deviation.  Cardiovascular:  Rate and Rhythm: Normal rateand regular rhythm.  Heart sounds: Normal heart sounds.No murmur. Nofriction rub. Nogallop.  Pulmonary:  Effort: Pulmonary effort is normal. Norespiratory distress.  Breath sounds: Normal breath sounds. Nowheezingor rales.  Chest:  Chest wall: No tenderness.  Abdominal:  General: Bowel sounds are normal. There is nodistension.  Palpations: Abdomen is soft. There is nomass.  Tenderness: There is no abdominal tenderness. There is noguardingor rebound.  Musculoskeletal:  Comments: Right leg movement limited due to pain. Skin: General: Skin is warm.  Findings: No erythemaor rash. Ecchymosis at R elbow with central abrasion, healing. Few minor abrasions R hand palmar surface. No abnormalities seen at R hip and no tenderness to palpation. Neurological:  Mental Status: She is alertand oriented to person, place, and time.  Psychiatric:  Judgment: Judgmentnormal.  LABORATORY PANEL:  Female CBC Recent Labs  Lab 02/26/18 0942  WBC 9.3  HGB 12.1  HCT 38.0  PLT 213   ------------------------------------------------------------------------------------------------------------------ Chemistries  Recent Labs  Lab 02/26/18 0942  NA 139  K 3.6  CL 103  CO2 31  GLUCOSE 102*  BUN 12  CREATININE 0.68  CALCIUM 9.2   RADIOLOGY:   Ct Hip Right Wo Contrast  Result Date: 02/26/2018 CLINICAL DATA: Hip pain after fall. EXAM: CT OF THE RIGHT HIP  WITHOUT CONTRAST TECHNIQUE: Multidetector CT imaging of the right hip was performed according to the standard protocol. Multiplanar CT image  reconstructions were also generated. COMPARISON: 02/25/2018 radiographs of the pelvis and right hip. FINDINGS: Bones/Joint/Cartilage Acute fractures at the right puboacetabular junction and minimally displaced along the mid right inferior pubic ramus, radiographically occult due to projection. The femoral head is seated within its acetabular component. Faint linear lucencies of the anterior wall of the acetabulum consistent with a nondisplaced fracture is also identified. No fracture of the proximal femur. No joint effusion. Ligaments Suboptimally assessed by CT. Muscles and Tendons No intramuscular hemorrhage. There is mild posttraumatic edema the abductor and obturator internus muscles due to inferior pubic ramus fracture. Soft tissues Mild soft tissue contusion along the posterolateral aspect of the head. IMPRESSION: 1. Acute fractures of the right puboacetabular junction and minimally displaced along the mid right inferior pubic ramus, radiographically occult due to projection. 2. Nondisplaced fracture of the anterior wall of the acetabulum. Electronically Signed By: Ashley Royalty M.D. On: 02/26/2018 03:50   Mr Hip Right Wo Contrast  Result Date: 02/26/2018 CLINICAL DATA: Right hip pain due to a fall today. Initial encounter. EXAM: MR OF THE RIGHT HIP WITHOUT CONTRAST TECHNIQUE: Multiplanar, multisequence MR imaging was performed. No intravenous contrast was administered. COMPARISON: CT right hip and plain films right hip 02/26/2018. FINDINGS: Bones: A seen on the prior CT scan, the patient has nondisplaced fractures of the right inferior pubic ramus and high right superior pubic ramus with associated marrow edema consistent with acute or subacute injury. Also seen are fractures of the high left superior pubic ramus and left inferior pubic ramus with a milder  degree of marrow edema consistent with more subacute injury. The patient also has a nondisplaced right sacral ala fracture with associated intense marrow edema. A milder degree of marrow edema is seen in the left sacrum consistent with more subacute fracture. The hip is located and there is no hip fracture. Articular cartilage and labrum Articular cartilage: Mildly degenerated. Labrum: Degenerated without focal tear. Joint or bursal effusion Joint effusion: None. Bursae: Negative. Muscles and tendons Muscles and tendons: Intact. There is some edema in the adductor musculature bilaterally, more intense on the right, compatible with strain or contusion related to the patient's fall. Other findings Miscellaneous: Imaged intrapelvic contents demonstrate no acute abnormality. IMPRESSION: This examination is positive for acute high right superior pubic ramus and right inferior pubic ramus fractures. There is also an acute right sacral ala fracture. The fractures are nondisplaced. High left superior pubic ramus and left inferior pubic ramus fractures with mild-to-moderate edema consistent with acute or late subacute injury. Mild marrow edema in the left sacrum is also consistent with a late subacute fracture. These fractures are nondisplaced. Negative for hip fracture. Right greater than left edema in the adductor musculature is consistent with strain or contusion. No tear. Electronically Signed By: Inge Rise M.D. On: 02/26/2018 09:13   Dg Knee Complete 4 Views Right  Result Date: 02/25/2018 CLINICAL DATA: Encounter gall fall today. Difficulty weight-bearing. EXAM: RIGHT KNEE - COMPLETE 4+ VIEW COMPARISON: 09/15/2017 FINDINGS: Moderate femorotibial and marked patellofemoral joint space narrowing without joint effusion or acute appearing fracture. A transverse lucency undermining the tibial spines on the AP view with sclerotic appearing margins may represent a cleft as opposed to a fracture given lack  of joint effusion. Mild soft tissue induration is noted along the anteromedial aspect of the knee. No joint dislocation is identified. Osteoarthritic spurring the tibial spines. IMPRESSION: 1. Soft tissue induration along the anteromedial aspect of the knee. 2. Moderate femorotibial and marked patellofemoral osteoarthritic joint space  narrowing without joint effusion or acute appearing fracture. 3. A transverse lucency undermining the tibial spines on the AP view with sclerotic appearing margins may represent a cleft or stigmata of old remote trauma as opposed to a fracture given lack of joint effusion. Electronically Signed By: Ashley Royalty M.D. On: 02/25/2018 21:43   Dg Hand Complete Right  Result Date: 02/25/2018 CLINICAL DATA: Right hand pain after fall. EXAM: RIGHT HAND - COMPLETE 3+ VIEW COMPARISON: None. FINDINGS: Mix of inflammatory and erosive osteoarthritis is noted of the right hand. There is an ankylosed appearance of the right fifth DIP joint. Erosive osteoarthritic change with marginal erosions are identified of the second and third DIP and second PIP joints with mild soft tissue swelling. Moderate joint space narrowing of the third through fifth PIP joints with small extra-articular and marginal erosions noted of the fifth digit and marginal erosions of the fourth digit. Marginal erosion of the base of the right second proximal phalanx is also identified. Osteoarthritis of the interphalangeal joint of thumb. Carpal rows are maintained with osteoarthritic joint space narrowing at base of the thumb metacarpal and triscaphe joint wrist. Subcortical cysts or erosions of the ulna are identified. 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 No acute osseous abnormality. Electronically Signed By: Ashley Royalty M.D. On: 02/25/2018 21:36   Dg Hip Unilat With Pelvis 2-3 Views Right  Result Date: 02/25/2018 CLINICAL DATA: Right-sided pain after  mechanical fall today. EXAM: DG HIP (WITH OR WITHOUT PELVIS) 2-3V RIGHT COMPARISON: None. FINDINGS: L4-5 and L5-S1 facet arthropathy is seen. Osteoarthritis of the right SI joint relative to left. No pelvic diastasis or fracture. Slight joint space narrowing of the left hip relative to right. No acute fracture of the proximal femora. No joint dislocation is seen. Soft tissues are largely unremarkable in appearance. IMPRESSION: 1. No acute osseous abnormality of the bony pelvis and right hip. 2. Lower lumbar facet arthropathy. 3. Osteoarthritis of the right SI joint. Electronically Signed By: Ashley Royalty M.D. On: 02/25/2018 21:31     ASSESSMENT AND PLAN:  Mrs. Havrilla is a 85 year old female with history of hypertension who suffered a mechanical fall and now has a right minimally displaced anterior wall acetabular fracture, root of the rami fracture and inferior pubic rami fracture on the right side. Non-surgical. Here for pain control pending SNF at d/c.  1. Right minimally displaced anterior wall acetabular fracture, root of the rami fracture and inferior pubic rami fracture on the right side. Patient evaluated by orthopedic surgery. Given minimally displaced nature of these fractures nonsurgical management has been recommended by Dr. Posey Pronto. Patient can be FFWB (flat foot weight bearing)on right lower extremity, was previously limited by pain. Physical therapy c/s and seeing patient: recommendation for SNF and patient and family have preference [facility]. Not available, LCSW working on placement at [facility]. Transitioned to PO pain medication. Ordered Ibuprofen PRN, has Tylenol PRN, Norco for extreme pain but would prefer not to use. Pain has responded to ibuprofen well per patient.  - has not required Norco since 2/3  2. Essential hypertension: Continue Hyzaar  3. Constipation: resolving,  PRN stool softeners. Will monitor. Had BM today.   All the records are reviewed and case is  discussed with Care Management/Social Worker. Management plans discussed with the patient and/or family and they are in agreement.  CODE STATUS: Full Code  TOTAL TIME TAKING CARE OF THIS PATIENT: 30 minutes.   More than 50% of the time was  spent in counseling/coordination of care: YES  POSSIBLE D/C IN 1 DAY, DEPENDING ON PLACEMENT.  Ripley Fraise PA-C on 02/28/2018 at 1:23 PM  [facility]  Office  [phone]  CC: Primary care physician; Tower, [name], MD

## 2018-02-28 NOTE — Progress Notes (Signed)
PT Cancellation Note  Patient Details Name: Sierra Bentley MRN: [id number] DOB: 04-07-1933   Cancelled Treatment:    Reason Eval/Treat Not Completed: Other (comment): Pt under observation status and declined PT services secondary to concerns related to potential out of pocket cost of services at this point in her stay.     [name] Anu Stagner PT, DPT 02/28/18, 1:50 PM

## 2018-02-28 NOTE — Clinical Social Work Note (Signed)
Sierra Bentley has stated that they are not able to accept patient. CSW has spoken to patient and made her aware. Patient has chosen [facility]. [facility] at [facility] has been notified to begin authorization with [employer]. Shela Leff MSW,LCSW [phone]

## 2018-03-01 MED ORDER — IBUPROFEN 600 MG PO TABS: 600.0000 mg | ORAL_TABLET | Freq: Four times a day (QID) | ORAL | 0 refills | Status: DC

## 2018-03-01 MED ORDER — ACETAMINOPHEN 325 MG PO TABS: 650.0000 mg | ORAL_TABLET | Freq: Four times a day (QID) | ORAL | 0 refills | Status: DC | PRN

## 2018-03-01 NOTE — Clinical Social Work Note (Signed)
Sierra Bentley with [facility] informed CSW that [facility] was asking for another PT eval. CSW contacted PT, Nicki Reaper, and he will work with patient today. CSW spoke with patient this morning and she is willing to work with patient. Shela Leff MSW,LCSW [phone]

## 2018-03-01 NOTE — Clinical Social Work Note (Signed)
Patient discharging today as Sierra Bentley has been received. Shela Leff MSW,LCSW [phone]

## 2018-03-01 NOTE — Discharge Instructions (Signed)
You were hospitalized for a hip fracture after a fall. We found your fracture to be "minimally displaced" which means it does not require surgery and will heal with rest, time, and activity as tolerated. You will be going to a [facility] to work on physical rehab for your hip.  More information on hip fractures and fall prevention safety is attached to your paperwork.  To control your pain you can continue to alternate acetaminophen 650 mg and ibuprofen 600 mg every 4-6 hours. The maximum dose for acetaminophen is 4000 mg in 24 hours. The maximum dose for ibuprofen is 3200 mg in 24 hours.

## 2018-03-01 NOTE — Progress Notes (Signed)
Physical Therapy Treatment Patient Details Name: Sierra Bentley MRN: [id number] DOB: [birth year] Today's Date: 03/01/2018    History of Present Illness Patient is 85 yo female presented to ED s/p fall, MRI did not show hip fracture but she has a minimally displaced anterior wall acetabular fracture, root of the ramus fracture and inferior pubic rami fracture on the right side. Patient can be FFWB on right lower extremity per ortho. PMH of GERD, HTN, osteopenia, RA    PT Comments    Pt presents with deficits in strength, transfers, mobility, gait, balance, and activity tolerance.  Pt required min A with bed mobility tasks to get her RLE in and out of bed.  Pt required min A to stand from an elevated EOB along with cues to ensure WB status compliance.  Pt was able to take 2-3 very small steps at the EOB with a RW and with cues for sequencing to ensure WB status compliance.  Pt required frequent min A while in standing to prevent LOB, most frequently in the posterior direction.  Pt is at a very high risk for falls and for noncompliance with her WB status and would not be safe to return to her prior living situation at this time.  Pt will benefit from PT services in a SNF setting upon discharge to safely address above deficits for decreased caregiver assistance and eventual return to PLOF.     Follow Up Recommendations  SNF     Equipment Recommendations  Other (comment)(TBD at next venue of care)    Recommendations for Other Services       Precautions / Restrictions Precautions Precautions: Fall Precaution Comments: pelvic/acetabular fxs Restrictions Weight Bearing Restrictions: Yes RLE Weight Bearing: (FFWB) Other Position/Activity Restrictions: Per orthopedic surgeon pt with weight bearing restriction equal to TTWB but with foot flat instead of in toe touch position    Mobility  Bed Mobility Overal bed mobility: Needs Assistance Bed Mobility: Supine to Sit;Sit to Supine      Supine to sit: Min assist Sit to supine: Min assist   General bed mobility comments: Min A for RLE in and out of bed  Transfers Overall transfer level: Needs assistance Equipment used: Rolling walker (2 wheeled) Transfers: Sit to/from Stand Sit to Stand: Min assist         General transfer comment: Mod verbal cues for sequencing to maintain WB status with pt's R foot flat on top of this PT's foot to ensure WB compliance  Ambulation/Gait   Gait Distance (Feet): 2 Feet with Min A for stability  Assistive device: Rolling walker (2 wheeled) Gait Pattern/deviations: Step-to pattern Gait velocity: decreased   General Gait Details: Mod verbal and visual cues for proper sequencing to ensure WB status compliance.  Pt's R foot flat on top of this PT's foot initially in standing to ensure WB compliance.    Stairs             Wheelchair Mobility    Modified Rankin (Stroke Patients Only)       Balance Overall balance assessment: Needs assistance Sitting-balance support: Feet supported Sitting balance-Leahy Scale: Good     Standing balance support: Bilateral upper extremity supported Standing balance-Leahy Scale: Poor Standing balance comment: Frequent Min A to prevent LOB in standing                            Cognition Arousal/Alertness: Awake/alert Behavior During Therapy: WFL for tasks assessed/performed Overall Cognitive  Status: Within Functional Limits for tasks assessed                                        Exercises Total Joint Exercises Ankle Circles/Pumps: AROM;Both;10 reps Hip ABduction/ADduction: AROM;AAROM;Both;5 reps;Other (comment)(AAROM on the RLE) Straight Leg Raises: AROM;AAROM;Both;5 reps;Other (comment)(AAROM on the RLE) Long Arc Quad: AROM;Both;10 reps Knee Flexion: AROM;Both;10 reps    General Comments        Pertinent Vitals/Pain Pain Assessment: No/denies pain    Home Living                       Prior Function            PT Goals (current goals can now be found in the care plan section) Progress towards PT goals: Progressing toward goals    Frequency    Min 2X/week      PT Plan Current plan remains appropriate    Co-evaluation              AM-PAC PT "6 Clicks" Mobility   Outcome Measure  Help needed turning from your back to your side while in a flat bed without using bedrails?: A Little Help needed moving from lying on your back to sitting on the side of a flat bed without using bedrails?: A Little Help needed moving to and from a bed to a chair (including a wheelchair)?: A Little Help needed standing up from a chair using your arms (e.g., wheelchair or bedside chair)?: A Little Help needed to walk in hospital room?: Total Help needed climbing 3-5 steps with a railing? : Total 6 Click Score: 14    End of Session Equipment Utilized During Treatment: Gait belt Activity Tolerance: Patient tolerated treatment well Patient left: in bed;with bed alarm set;with call bell/phone within reach;with SCD's reapplied Nurse Communication: Mobility status PT Visit Diagnosis: Difficulty in walking, not elsewhere classified (R26.2);Other abnormalities of gait and mobility (R26.89);Muscle weakness (generalized) (M62.81);History of falling (Z91.81);Pain Pain - Right/Left: Right Pain - part of body: Hip     Time: 7341-9379 PT Time Calculation (min) (ACUTE ONLY): 18 min  Charges:  $Therapeutic Exercise: 8-22 mins                     D. Scott Lolamae Voisin PT, DPT 03/01/18, 11:59 AM

## 2018-03-01 NOTE — Progress Notes (Signed)
Called report to [facility] [phone], Kim receiving patient in room 605.   Fuller Mandril, RN

## 2018-03-01 NOTE — Progress Notes (Signed)
Sierra Bentley to be D/C'd Rehab per MD order.  Discussed prescriptions and follow up appointments with the patient. Prescriptions given to patient, medication list explained in detail. Pt verbalized understanding.  Allergies as of 03/01/2018      Reactions   Ace Inhibitors    REACTION: cough   Alendronate Sodium    REACTION: reflux, trouble swallowing   Chocolate    Dust Mite Extract    Ibandronate Sodium    REACTION: reflux and increased BP      Medication List    STOP taking these medications   furosemide 20 MG tablet Commonly known as:  LASIX     TAKE these medications   acetaminophen 325 MG tablet Commonly known as:  TYLENOL Take 2 tablets (650 mg total) by mouth every 6 (six) hours as needed for mild pain (or Fever >/= 101).   ibuprofen 600 MG tablet Commonly known as:  ADVIL,MOTRIN Take 1 tablet (600 mg total) by mouth 4 (four) times daily.   losartan-hydrochlorothiazide 50-12.5 MG tablet Commonly known as:  HYZAAR Take 0.5 tablets by mouth daily. What changed:  when to take this   multivitamin tablet Take 1 tablet by mouth daily.   pantoprazole 40 MG tablet Commonly known as:  PROTONIX Take 40 mg by mouth every evening.   sodium chloride 5 % ophthalmic ointment Commonly known as:  MURO 128 Place 1 drop into both eyes at bedtime.   SYSTANE ULTRA OP Place 1 drop into both eyes 4 (four) times daily.   TURMERIC PO Take 2 capsules by mouth daily before breakfast.       Vitals:   03/01/18 1200 03/01/18 1304  BP: 118/60 96/60  Pulse: 86 86  Resp: (!) 26 15  Temp: 98.1 F (36.7 C) 98.9 F (37.2 C)  SpO2: 94% 96%    Skin clean, dry and intact without evidence of skin break down, no evidence of skin tears noted. IV catheter discontinued intact. Site without signs and symptoms of complications. Dressing and pressure applied. Pt denies pain at this time. No complaints noted.  An After Visit Summary was printed and given to the patient. Patient escorted  via WC, and D/C to [facility] via private auto.  Fuller Mandril, RN

## 2018-03-31 ENCOUNTER — Encounter: Payer: Self-pay | Admitting: Family Medicine

## 2018-03-31 ENCOUNTER — Ambulatory Visit: Payer: Medicare Other | Admitting: Family Medicine

## 2018-03-31 VITALS — BP 144/66 | HR 82 | Temp 98.0°F | Ht 65.75 in | Wt 140.5 lb

## 2018-03-31 DIAGNOSIS — R300 Dysuria: Secondary | ICD-10-CM | POA: Diagnosis not present

## 2018-03-31 DIAGNOSIS — M069 Rheumatoid arthritis, unspecified: Secondary | ICD-10-CM

## 2018-03-31 DIAGNOSIS — R42 Dizziness and giddiness: Secondary | ICD-10-CM | POA: Diagnosis not present

## 2018-03-31 DIAGNOSIS — S72001S Fracture of unspecified part of neck of right femur, sequela: Secondary | ICD-10-CM

## 2018-03-31 DIAGNOSIS — N390 Urinary tract infection, site not specified: Secondary | ICD-10-CM | POA: Insufficient documentation

## 2018-03-31 DIAGNOSIS — N3000 Acute cystitis without hematuria: Secondary | ICD-10-CM | POA: Diagnosis not present

## 2018-03-31 DIAGNOSIS — M8000XS Age-related osteoporosis with current pathological fracture, unspecified site, sequela: Secondary | ICD-10-CM

## 2018-03-31 DIAGNOSIS — H8190 Unspecified disorder of vestibular function, unspecified ear: Secondary | ICD-10-CM | POA: Insufficient documentation

## 2018-03-31 LAB — POC URINALSYSI DIPSTICK (AUTOMATED)
Bilirubin, UA: NEGATIVE
Blood, UA: 50
Glucose, UA: NEGATIVE
Ketones, UA: NEGATIVE
Nitrite, UA: 0.2
Protein, UA: NEGATIVE
Spec Grav, UA: 1.015 (ref 1.010–1.025)
Urobilinogen, UA: 0.2 E.U./dL
pH, UA: 6 (ref 5.0–8.0)

## 2018-03-31 MED ORDER — CEPHALEXIN 500 MG PO CAPS: 500.0000 mg | ORAL_CAPSULE | Freq: Three times a day (TID) | ORAL | 0 refills | Status: DC

## 2018-03-31 NOTE — Assessment & Plan Note (Signed)
Pos ua with frequency/dysuria  Uncomplicated cx sent Keflex tid for 5-7 d  Update if no imp in several days or worse

## 2018-03-31 NOTE — Patient Instructions (Signed)
I'm glad you are recovering   Keep doing your physical therapy  We may re visit therapy for balance and dizziness later   Take the keflex for uti  We will do a urine culture and alert you when that returns   Drink water!   Update if not starting to improve next week or if worsening

## 2018-03-31 NOTE — Assessment & Plan Note (Signed)
Acetabulum and pelvis R  Healing S/p snf stay Home PT Disc fall prev  Will disc OP again once recovered Ref record and labs from rehab Overall doing well

## 2018-03-31 NOTE — Assessment & Plan Note (Signed)
Has had pelvic/hip fx Will re visit this once healed/rehab

## 2018-03-31 NOTE — Progress Notes (Signed)
Subjective:    Patient ID: Sierra Bentley, female    DOB: 02-12-1933, 85 y.o.   MRN: [id number]  HPI  Here for symptoms of uti   She is recovering from pelvic fracture and hip fracture (Right) Wt Readings from Last 3 Encounters:  03/31/18 140 lb 8 oz (63.7 kg)  02/25/18 140 lb (63.5 kg)  12/02/17 150 lb 12 oz (68.4 kg)   22.85 kg/m   ua is pos for large blood and leukocytes  Results for orders placed or performed in visit on 03/31/18  POCT Urinalysis Dipstick (Automated)  Result Value Ref Range   Color, UA Yellow    Clarity, UA Cloudy    Glucose, UA Negative Negative   Bilirubin, UA Negative    Ketones, UA Negative    Spec Grav, UA 1.015 1.010 - 1.025   Blood, UA 50 Ery/uL    pH, UA 6.0 5.0 - 8.0   Protein, UA Negative Negative   Urobilinogen, UA 0.2 0.2 or 1.0 E.U./dL   Nitrite, UA 0.2    Leukocytes, UA Large (3+) (A) Negative     Just got out of rehab  A few nights ago had trouble passing urine  Drank water and it helped   Now she has burning with urination  Frequency and urgency  Incontinence  No blood in urine - urine is very yellow   No flank pain  No fever  No vomiting but a little nauseated   She did have bronchitis when in rehab (with laryngitis) Given prednisone and amoxicillin  Chest xray was ok  [facility] nurse gave her some benadryl  Much better now  Cough is almost gone   Pain from fractures is improved Able to put full wt on R leg now with walker   Ortho- Dr Posey Pronto   Patient Active Problem List   Diagnosis Date Noted  . UTI (urinary tract infection) 03/31/2018  . Vestibular dizziness 03/31/2018  . Hip fracture, right ([place]) 02/27/2018  . Tingling of both feet 10/07/2017  . Pedal edema 09/28/2017  . Fall at home 09/28/2017  . Prediabetes 11/30/2016  . Toenail fungus 10/03/2015  . At moderate risk for fall 09/05/2015  . Cervical spondylosis without myelopathy 08/05/2015  . Routine general medical examination at a health care facility  08/27/2014  . Colon cancer screening 08/27/2014  . Fall against object 08/15/2014  . Encounter for Medicare annual wellness exam 07/25/2013  . Varicosities of leg 05/23/2012  . Abnormal ultrasound of thyroid gland 03/01/2011  . Hyperlipidemia 06/11/2008  . ALLERGIC RHINITIS 03/29/2008  . Essential hypertension, benign 12/26/2006  . GERD 12/26/2006  . Rheumatoid arthritis ([place]) 12/26/2006  . Osteoporosis 12/26/2006  . URINARY INCONTINENCE 12/26/2006   Past Medical History:  Diagnosis Date  . Allergic rhinitis, cause unspecified   . Cramp of limb   . Diverticulosis   . Epistaxis   . GERD (gastroesophageal reflux disease)   . HTN (hypertension)   . Osteopenia   . Other and unspecified hyperlipidemia   . Other malaise and fatigue   . Rheumatoid arthritis(714.0)   . Urine incontinence    Past Surgical History:  Procedure Laterality Date  . BREAST BIOPSY  2/11   fibrocystic change  . BREAST BIOPSY  8/11   fibrocystic change  . COLONOSCOPY  2003  . CYSTOSCOPY  2006  . MOLE REMOVAL     rectal  . TONSILLECTOMY     Social History   Tobacco Use  . Smoking status: Never Smoker  .  Smokeless tobacco: Never Used  Substance Use Topics  . Alcohol use: No    Alcohol/week: 0.0 standard drinks  . Drug use: No   Family History  Problem Relation Age of Onset  . Heart attack Father 96  . Coronary artery disease Father   . Other Mother        Arrythmia  . Coronary artery disease Mother   . Hypertension Mother   . Anxiety disorder Mother   . Breast cancer Other        2nd cousin  . Depression Other        family history   Allergies  Allergen Reactions  . Ace Inhibitors     REACTION: cough  . Alendronate Sodium     REACTION: reflux, trouble swallowing  . Chocolate   . Dust Mite Extract   . Ibandronate Sodium     REACTION: reflux and increased BP  . Oxycodone Other (See Comments)    Dizziness resulting in fall Dizziness resulting in fall    Current Outpatient  Medications on File Prior to Visit  Medication Sig Dispense Refill  . acetaminophen (TYLENOL) 325 MG tablet Take 2 tablets (650 mg total) by mouth every 6 (six) hours as needed for mild pain (or Fever >/= 101). 30 tablet 0  . ibuprofen (ADVIL,MOTRIN) 600 MG tablet Take 1 tablet (600 mg total) by mouth 4 (four) times daily. 30 tablet 0  . losartan-hydrochlorothiazide (HYZAAR) 50-12.5 MG tablet Take 0.5 tablets by mouth daily. (Patient taking differently: Take 0.5 tablets by mouth every evening. ) 45 tablet 3  . Multiple Vitamin (MULTIVITAMIN) tablet Take 1 tablet by mouth daily.      . pantoprazole (PROTONIX) 40 MG tablet Take 40 mg by mouth every evening.   11  . Polyethyl Glycol-Propyl Glycol (SYSTANE ULTRA OP) Place 1 drop into both eyes 4 (four) times daily.      . sodium chloride (MURO 128) 5 % ophthalmic ointment Place 1 drop into both eyes at bedtime.     . TURMERIC PO Take 2 capsules by mouth daily before breakfast.     No current facility-administered medications on file prior to visit.     Review of Systems  Constitutional: Positive for activity change and fatigue. Negative for appetite change, chills, diaphoresis and fever.  HENT: Negative for congestion and sore throat.   Eyes: Negative for itching and visual disturbance.  Respiratory: Negative for cough and shortness of breath.   Cardiovascular: Negative for leg swelling.  Gastrointestinal: Negative for abdominal distention, abdominal pain, constipation, diarrhea and nausea.  Endocrine: Negative for cold intolerance and polydipsia.  Genitourinary: Positive for dysuria, frequency and urgency. Negative for difficulty urinating, flank pain and hematuria.  Musculoskeletal: Negative for myalgias.       Pelvis and hip pain -improved   Skin: Negative for rash.  Allergic/Immunologic: Negative for immunocompromised state.  Neurological: Positive for dizziness. Negative for tremors, seizures, syncope, facial asymmetry, speech difficulty,  weakness, numbness and headaches.  Hematological: Negative for adenopathy.       Objective:   Physical Exam Constitutional:      General: She is not in acute distress.    Appearance: Normal appearance. She is normal weight.  HENT:     Head: Normocephalic and atraumatic.     Mouth/Throat:     Mouth: Mucous membranes are moist.     Pharynx: Oropharynx is clear.  Eyes:     General: No scleral icterus.    Conjunctiva/sclera: Conjunctivae normal.  Pupils: Pupils are equal, round, and reactive to light.  Neck:     Musculoskeletal: Normal range of motion.     Vascular: No carotid bruit.  Cardiovascular:     Rate and Rhythm: Normal rate and regular rhythm.     Pulses: Normal pulses.     Heart sounds: Normal heart sounds.  Pulmonary:     Effort: No respiratory distress.     Breath sounds: Normal breath sounds. No wheezing or rales.  Abdominal:     General: Abdomen is flat. Bowel sounds are normal. There is no distension.     Palpations: Abdomen is soft. There is no mass.     Tenderness: There is no abdominal tenderness.     Comments: Suprapubic palpation creates feeling of bladder fullness to pt but not pain  No cva tenderness   Musculoskeletal:     Right lower leg: No edema.     Left lower leg: No edema.     Comments: Sitting in wheelchair  Can bear weight with walker if needed  No pelvic tenderness on light palpation   Lymphadenopathy:     Cervical: No cervical adenopathy.  Skin:    General: Skin is warm and dry.  Neurological:     Mental Status: She is alert. Mental status is at baseline.     Deep Tendon Reflexes: Reflexes normal.     Comments: Gets dizzy with lateral gaze (baseline)  Poor balance  Psychiatric:        Mood and Affect: Mood normal.           Assessment & Plan:   Problem List Items Addressed This Visit      Nervous and Auditory   Vestibular dizziness    Chronic  Per pt ENT determined it was from a permanent damage to inner ear  She is  interested in repeating balance PT after finishing post fx PT Disc caution with movement         Musculoskeletal and Integument   Osteoporosis    Has had pelvic/hip fx Will re visit this once healed/rehab      Hip fracture, right ([place])    Acetabulum and pelvis R  Healing S/p snf stay Home PT Disc fall prev  Will disc OP again once recovered Ref record and labs from rehab Overall doing well         Genitourinary   UTI (urinary tract infection) - Primary    Pos ua with frequency/dysuria  Uncomplicated cx sent Keflex tid for 5-7 d  Update if no imp in several days or worse       Relevant Medications   cephALEXin (KEFLEX) 500 MG capsule   Other Relevant Orders   Urine Culture    Other Visit Diagnoses    Dysuria       Relevant Orders   POCT Urinalysis Dipstick (Automated) (Completed)

## 2018-03-31 NOTE — Assessment & Plan Note (Signed)
Chronic  Per pt ENT determined it was from a permanent damage to inner ear  She is interested in repeating balance PT after finishing post fx PT Disc caution with movement

## 2018-03-31 NOTE — Assessment & Plan Note (Signed)
Stable/no treatment

## 2018-04-02 LAB — URINE CULTURE
MICRO NUMBER:: 287375
SPECIMEN QUALITY:: ADEQUATE

## 2018-04-05 ENCOUNTER — Telehealth: Payer: Self-pay

## 2018-04-05 NOTE — Telephone Encounter (Signed)
Please verbally ok that order  

## 2018-04-05 NOTE — Telephone Encounter (Signed)
Merry Proud PT with [facility] requesting verbal orders HH PT 2 x a wk for 3 wks for lower extremity strengthening and gait training.Please advise.

## 2018-04-05 NOTE — Telephone Encounter (Signed)
Verbal order given to [facility]

## 2018-04-06 ENCOUNTER — Telehealth: Payer: Self-pay

## 2018-04-06 MED ORDER — SULFAMETHOXAZOLE-TRIMETHOPRIM 800-160 MG PO TABS: 1.0000 | ORAL_TABLET | Freq: Two times a day (BID) | ORAL | 0 refills | Status: DC

## 2018-04-06 NOTE — Telephone Encounter (Signed)
Patient states she is having burning with urination, decreased urine output. Not as bad as it was but still present. Patient states she was told to call back if she was still having issues. No fever. No blood in urine. No nausea or diarrhea or vomiting. Patient is still on Keflex daily. Please review.

## 2018-04-06 NOTE — Telephone Encounter (Signed)
Thanks for letting me know  I will send bactrim DS to her pharmacy  Stop the keflex and start this  Then alert [place] if no improvement

## 2018-04-07 NOTE — Telephone Encounter (Signed)
Pt called back she didn't listen to the VM I left so I did advise her directly of Dr. Marliss Coots comments

## 2018-04-07 NOTE — Telephone Encounter (Signed)
Per DPR left VM letting pt know Dr. Tower's comments  

## 2018-04-11 ENCOUNTER — Telehealth: Payer: Self-pay

## 2018-04-11 DIAGNOSIS — S32591D Other specified fracture of right pubis, subsequent encounter for fracture with routine healing: Secondary | ICD-10-CM | POA: Diagnosis not present

## 2018-04-11 DIAGNOSIS — S32592D Other specified fracture of left pubis, subsequent encounter for fracture with routine healing: Secondary | ICD-10-CM

## 2018-04-11 DIAGNOSIS — M069 Rheumatoid arthritis, unspecified: Secondary | ICD-10-CM

## 2018-04-11 DIAGNOSIS — S32511D Fracture of superior rim of right pubis, subsequent encounter for fracture with routine healing: Secondary | ICD-10-CM

## 2018-04-11 DIAGNOSIS — Z9181 History of falling: Secondary | ICD-10-CM

## 2018-04-11 DIAGNOSIS — S32411D Displaced fracture of anterior wall of right acetabulum, subsequent encounter for fracture with routine healing: Secondary | ICD-10-CM

## 2018-04-11 DIAGNOSIS — I10 Essential (primary) hypertension: Secondary | ICD-10-CM

## 2018-04-11 DIAGNOSIS — S32110D Nondisplaced Zone I fracture of sacrum, subsequent encounter for fracture with routine healing: Secondary | ICD-10-CM

## 2018-04-11 DIAGNOSIS — W19XXXD Unspecified fall, subsequent encounter: Secondary | ICD-10-CM

## 2018-04-11 DIAGNOSIS — S32512D Fracture of superior rim of left pubis, subsequent encounter for fracture with routine healing: Secondary | ICD-10-CM

## 2018-04-11 NOTE — Telephone Encounter (Signed)
Aware, thanks!

## 2018-04-11 NOTE — Telephone Encounter (Signed)
Esther OT with [facility] left v/m that Sherlynn Stalls went out to do [facility] OT eval and pt declined the [facility] OT eval.Esther does not require cb; this is FYI to Dr Glori Bickers.

## 2018-04-20 ENCOUNTER — Telehealth: Payer: Self-pay

## 2018-04-20 NOTE — Telephone Encounter (Signed)
Please ok that verbal order  

## 2018-04-20 NOTE — Telephone Encounter (Signed)
Sierra Bentley collins HH PT with [facility] request extension of HH PT starting next week; 2 x a wk for 2 wks and 1 x a wk for 2 wks.Please advise.

## 2018-04-21 NOTE — Telephone Encounter (Signed)
Verbal order given  

## 2018-07-24 ENCOUNTER — Encounter: Payer: Medicare Other | Admitting: Obstetrics & Gynecology

## 2018-08-14 ENCOUNTER — Encounter: Payer: Self-pay | Admitting: Anesthesiology

## 2018-08-23 ENCOUNTER — Encounter: Payer: Self-pay | Admitting: Obstetrics & Gynecology

## 2018-08-23 ENCOUNTER — Other Ambulatory Visit: Payer: Self-pay

## 2018-08-23 ENCOUNTER — Ambulatory Visit (INDEPENDENT_AMBULATORY_CARE_PROVIDER_SITE_OTHER): Payer: Medicare Other | Admitting: Obstetrics & Gynecology

## 2018-08-23 VITALS — BP 106/68 | Ht 63.5 in | Wt 133.0 lb

## 2018-08-23 DIAGNOSIS — Z01419 Encounter for gynecological examination (general) (routine) without abnormal findings: Secondary | ICD-10-CM | POA: Diagnosis not present

## 2018-08-23 DIAGNOSIS — M8589 Other specified disorders of bone density and structure, multiple sites: Secondary | ICD-10-CM

## 2018-08-23 DIAGNOSIS — Z780 Asymptomatic menopausal state: Secondary | ICD-10-CM

## 2018-08-23 NOTE — Patient Instructions (Signed)
1. Well female exam with routine gynecological exam Normal gynecologic exam in menopause.  Pap test negative in February 2016, no indication to repeat anymore.  Abstinent.  Breast exam normal.  Screening mammogram October 2019 was negative.  Colonoscopy in 2013.  Health labs with family physician.  Good body mass index at 23.19.  Continued fitness activities and healthy nutrition encouraged.  2. Postmenopause Well on no hormone replacement therapy.  No postmenopausal bleeding.  3. Osteopenia of multiple sites History of osteoporosis and fracture of her pelvic bone after a fall in February 2020.  Last bone density in July 2020 showed only osteopenia with a T score at -1.9.  Will continue on vitamin D and calcium intake of 1200 mg daily.  Encouraged to do weightbearing physical activities on a regular basis.  Sierra Bentley, it was a pleasure seeing you today!

## 2018-08-23 NOTE — Progress Notes (Signed)
Sierra Bentley 1933-12-11 [id number]   History:    85 y.o. G0 Married.  Husband is 32 yo with mild Dementia.  Living together in their house.  RP:  Established patient presenting for annual gyn exam   HPI: Menopause, well on no hormone replacement therapy.  Abstinent.  No postmenopausal bleeding.  No pelvic pain.  Had a Cystitis treated recently.  No urinary tract infection symptoms currently.  Frequent urinary incontinence sometimes with effort and as other times just a feeling of urgency. Last Pap test in February 2016 was negative.  Breasts normal.  Body mass index 23.19.  Physically active.  Fasting health labs with family physician.  Osteoporosis previously and Pelvic bone fracture after a fall 02/2018.  BD 07/2018 Osteopenia.  Prefers no medical treatment at this time, taking vitamin D supplements, calcium rich nutrition and doing regular weightbearing physical activities.  Past medical history,surgical history, family history and social history were all reviewed and documented in the EPIC chart.  Gynecologic History No LMP recorded. Patient is postmenopausal. Contraception: post menopausal status Last Pap: 2016. Results were: Negative Last mammogram: 10/2017. Results were: Negative Bone Density: 07/2018 Osteopenia T-Score -1.9.  Fall in 02/2018 with pelvic bone fracture. Colonoscopy: 2013  Obstetric History OB History  Gravida Para Term Preterm AB Living  0 0 0 0 0 0  SAB TAB Ectopic Multiple Live Births  0 0 0 0 0  Obstetric Comments  HAS 2 STEP SONS      ROS: A ROS was performed and pertinent positives and negatives are included in the history.  GENERAL: No fevers or chills. HEENT: No change in vision, no earache, sore throat or sinus congestion. NECK: No pain or stiffness. CARDIOVASCULAR: No chest pain or pressure. No palpitations. PULMONARY: No shortness of breath, cough or wheeze. GASTROINTESTINAL: No abdominal pain, nausea, vomiting or diarrhea, melena or bright red  blood per rectum. GENITOURINARY: No urinary frequency, urgency, hesitancy or dysuria. MUSCULOSKELETAL: No joint or muscle pain, no back pain, no recent trauma. DERMATOLOGIC: No rash, no itching, no lesions. ENDOCRINE: No polyuria, polydipsia, no heat or cold intolerance. No recent change in weight. HEMATOLOGICAL: No anemia or easy bruising or bleeding. NEUROLOGIC: No headache, seizures, numbness, tingling or weakness. PSYCHIATRIC: No depression, no loss of interest in normal activity or change in sleep pattern.     Exam:   BP 106/68   Ht 5' 3.5" (1.613 m)   Wt 133 lb (60.3 kg)   BMI 23.19 kg/m   Body mass index is 23.19 kg/m.  General appearance : Well developed well nourished female. No acute distress HEENT: Eyes: no retinal hemorrhage or exudates,  Neck supple, trachea midline, no carotid bruits, no thyroidmegaly Lungs: Clear to auscultation, no rhonchi or wheezes, or rib retractions  Heart: Regular rate and rhythm, no murmurs or gallops Breast:Examined in sitting and supine position were symmetrical in appearance, no palpable masses or tenderness,  no skin retraction, no nipple inversion, no nipple discharge, no skin discoloration, no axillary or supraclavicular lymphadenopathy Abdomen: no palpable masses or tenderness, no rebound or guarding Extremities: no edema or skin discoloration or tenderness  Pelvic: Vulva: Normal             Vagina: No gross lesions or discharge  Cervix: No gross lesions or discharge  Uterus  AV, normal size, shape and consistency, non-tender and mobile  Adnexa  Without masses or tenderness  Anus: Normal   Assessment/Plan:  85 y.o. female for annual exam   1.  Well female exam with routine gynecological exam Normal gynecologic exam in menopause.  Pap test negative in February 2016, no indication to repeat anymore.  Abstinent.  Breast exam normal.  Screening mammogram October 2019 was negative.  Colonoscopy in 2013.  Health labs with family physician.   Good body mass index at 23.19.  Continued fitness activities and healthy nutrition encouraged.  2. Postmenopause Well on no hormone replacement therapy.  No postmenopausal bleeding.  3. Osteopenia of multiple sites History of osteoporosis and fracture of her pelvic bone after a fall in February 2020.  Last bone density in July 2020 showed only osteopenia with a T score at -1.9.  Will continue on vitamin D and calcium intake of 1200 mg daily.  Encouraged to do weightbearing physical activities on a regular basis.  Princess Bruins MD, 10:10 AM 08/23/2018

## 2018-08-29 NOTE — Telephone Encounter (Signed)
I called patient and read her result note to her since email was returned unread.

## 2018-09-04 DIAGNOSIS — H353211 Exudative age-related macular degeneration, right eye, with active choroidal neovascularization: Secondary | ICD-10-CM | POA: Insufficient documentation

## 2018-10-23 ENCOUNTER — Ambulatory Visit (INDEPENDENT_AMBULATORY_CARE_PROVIDER_SITE_OTHER): Payer: Medicare Other | Admitting: Women's Health

## 2018-10-23 ENCOUNTER — Other Ambulatory Visit: Payer: Self-pay

## 2018-10-23 ENCOUNTER — Encounter: Payer: Self-pay | Admitting: Women's Health

## 2018-10-23 VITALS — BP 110/70 | Ht 63.0 in | Wt 132.0 lb

## 2018-10-23 DIAGNOSIS — R300 Dysuria: Secondary | ICD-10-CM

## 2018-10-23 DIAGNOSIS — N3000 Acute cystitis without hematuria: Secondary | ICD-10-CM | POA: Diagnosis not present

## 2018-10-23 DIAGNOSIS — N898 Other specified noninflammatory disorders of vagina: Secondary | ICD-10-CM | POA: Diagnosis not present

## 2018-10-23 LAB — WET PREP FOR TRICH, YEAST, CLUE

## 2018-10-23 MED ORDER — SULFAMETHOXAZOLE-TRIMETHOPRIM 800-160 MG PO TABS: 1.0000 | ORAL_TABLET | Freq: Two times a day (BID) | ORAL | 0 refills | Status: DC

## 2018-10-23 NOTE — Progress Notes (Signed)
85 year old MWF G2 P2 presents with complaint of increased urinary pain and burning at the end of stream of urination for the past 3 days.    Has incontinence wears pads daily changes 2-3 times daily and wears a depends at night.  No change in amount of incontinence or leakage of urine since symptoms started.  Not sexually active in many years.  Denies vaginal discharge, abdominal/back pain or fever.  States had  first UTI 02/25/2018 after fractured hip and pelvis while in rehab struggling with some incontinence and mobility problems.  Medical problems include hypertension, GERD.  Retired Education officer, [employer].  Exam: Appears well.  No CVAT.  External genitalia erythematous at introitus, wet prep done with a Q-tip, wet prep negative. UA: Trace blood, negative nitrites, +1 leukocytes, 20-40 WBCs, 0-2 RBCs, 0-5 squamous epithelials, moderate bacteria   UTI  Plan: Septra twice daily for 3 days, prescription, proper use given reviewed importance of drinking fluids.  UTI prevention discussed.  Applying over-the-counter A&D ointment externally after showers may help with some of the irritation.  Instructed to call if continued problems.  Urine culture pending.  Medication options for overactive bladder/incontinence discussed reviewed may cause more problems with constipation.

## 2018-10-23 NOTE — Patient Instructions (Signed)
Apply A&D ointment extrarenally after showers  Urinary Tract Infection, Adult A urinary tract infection (UTI) is an infection of any part of the urinary tract. The urinary tract includes:  The kidneys.  The ureters.  The bladder.  The urethra. These organs make, store, and get rid of pee (urine) in the body. What are the causes? This is caused by germs (bacteria) in your genital area. These germs grow and cause swelling (inflammation) of your urinary tract. What increases the risk? You are more likely to develop this condition if:  You have a small, thin tube (catheter) to drain pee.  You cannot control when you pee or poop (incontinence).  You are female, and: ? You use these methods to prevent pregnancy: ? A medicine that kills sperm (spermicide). ? A device that blocks sperm (diaphragm). ? You have low levels of a female hormone (estrogen). ? You are pregnant.  You have genes that add to your risk.  You are sexually active.  You take antibiotic medicines.  You have trouble peeing because of: ? A prostate that is bigger than normal, if you are female. ? A blockage in the part of your body that drains pee from the bladder (urethra). ? A kidney [place]z. ? A nerve condition that affects your bladder (neurogenic bladder). ? Not getting enough to drink. ? Not peeing often enough.  You have other conditions, such as: ? Diabetes. ? A weak disease-fighting system (immune system). ? Sickle cell disease. ? Gout. ? Injury of the spine. What are the signs or symptoms? Symptoms of this condition include:  Needing to pee right away (urgently).  Peeing often.  Peeing small amounts often.  Pain or burning when peeing.  Blood in the pee.  Pee that smells bad or not like normal.  Trouble peeing.  Pee that is cloudy.  Fluid coming from the vagina, if you are female.  Pain in the belly or lower back. Other symptoms include:  Throwing up (vomiting).  No urge to  eat.  Feeling mixed up (confused).  Being tired and grouchy (irritable).  A fever.  Watery poop (diarrhea). How is this treated? This condition may be treated with:  Antibiotic medicine.  Other medicines.  Drinking enough water. Follow these instructions at home:  Medicines  Take over-the-counter and prescription medicines only as told by your doctor.  If you were prescribed an antibiotic medicine, take it as told by your doctor. Do not stop taking it even if you start to feel better. General instructions  Make sure you: ? Pee until your bladder is empty. ? Do not hold pee for a long time. ? Empty your bladder after sex. ? Wipe from front to back after pooping if you are a female. Use each tissue one time when you wipe.  Drink enough fluid to keep your pee pale yellow.  Keep all follow-up visits as told by your doctor. This is important. Contact a doctor if:  You do not get better after 1-2 days.  Your symptoms go away and then come back. Get help right away if:  You have very bad back pain.  You have very bad pain in your lower belly.  You have a fever.  You are sick to your stomach (nauseous).  You are throwing up. Summary  A urinary tract infection (UTI) is an infection of any part of the urinary tract.  This condition is caused by germs in your genital area.  There are many risk factors for a  UTI. These include having a small, thin tube to drain pee and not being able to control when you pee or poop.  Treatment includes antibiotic medicines for germs.  Drink enough fluid to keep your pee pale yellow. This information is not intended to replace advice given to you by your health care provider. Make sure you discuss any questions you have with your health care provider. Document Released: 06/30/2007 Document Revised: 12/29/2017 Document Reviewed: 07/21/2017 Elsevier Patient Education  2020 [employer].

## 2018-10-26 LAB — URINE CULTURE
MICRO NUMBER:: 928710
SPECIMEN QUALITY:: ADEQUATE

## 2018-10-26 LAB — URINALYSIS, COMPLETE W/RFL CULTURE
Bilirubin Urine: NEGATIVE
Glucose, UA: NEGATIVE
Hyaline Cast: NONE SEEN /LPF
Ketones, ur: NEGATIVE
Nitrites, Initial: NEGATIVE
Protein, ur: NEGATIVE
Specific Gravity, Urine: 1.015 (ref 1.001–1.03)
pH: 7 (ref 5.0–8.0)

## 2018-10-26 LAB — CULTURE INDICATED

## 2018-10-27 ENCOUNTER — Telehealth: Payer: Self-pay

## 2018-10-27 ENCOUNTER — Other Ambulatory Visit: Payer: Self-pay

## 2018-10-27 DIAGNOSIS — N3000 Acute cystitis without hematuria: Secondary | ICD-10-CM

## 2018-10-27 MED ORDER — SULFAMETHOXAZOLE-TRIMETHOPRIM 800-160 MG PO TABS: 1.0000 | ORAL_TABLET | Freq: Two times a day (BID) | ORAL | 0 refills | Status: DC

## 2018-10-27 NOTE — Telephone Encounter (Signed)
-----   Message from Huel Cote, NP sent at 10/26/2018  4:13 PM EDT ----- Please call and review urine culture was positive and antibiotic given at office visit should cure. Let me know if still not feeling well

## 2018-10-27 NOTE — Telephone Encounter (Signed)
Patient informed. She has one pill let to take. She said she is still having the slightest bit of burning at end of urination and it lingers a little after. She said the pain with urination has resolved though.

## 2018-10-27 NOTE — Telephone Encounter (Signed)
I called patient and left message in voice mail that [place] recommended 2 additional days of Septra. Rx sent in and I asked patient to call me back and let me know that she received this message.

## 2018-10-27 NOTE — Telephone Encounter (Signed)
I called patient and spoke with her and advised regarding the 2 extra days of Septra. She will pick that up at her pharmacy.

## 2018-10-27 NOTE — Telephone Encounter (Signed)
Please call in 2 more days septra bid for 2 day #4  thanks

## 2018-11-06 ENCOUNTER — Encounter: Payer: Self-pay | Admitting: Family Medicine

## 2018-11-14 ENCOUNTER — Ambulatory Visit (INDEPENDENT_AMBULATORY_CARE_PROVIDER_SITE_OTHER): Payer: Medicare Other

## 2018-11-14 DIAGNOSIS — Z23 Encounter for immunization: Secondary | ICD-10-CM | POA: Diagnosis not present

## 2018-11-30 ENCOUNTER — Telehealth: Payer: Self-pay | Admitting: Family Medicine

## 2018-11-30 DIAGNOSIS — E7800 Pure hypercholesterolemia, unspecified: Secondary | ICD-10-CM

## 2018-11-30 DIAGNOSIS — R7303 Prediabetes: Secondary | ICD-10-CM

## 2018-11-30 DIAGNOSIS — M8000XS Age-related osteoporosis with current pathological fracture, unspecified site, sequela: Secondary | ICD-10-CM

## 2018-11-30 DIAGNOSIS — I10 Essential (primary) hypertension: Secondary | ICD-10-CM

## 2018-11-30 NOTE — Telephone Encounter (Signed)
-----   Message from Ellamae Sia sent at 11/20/2018  2:44 PM EDT ----- Regarding: Lab orders for Friday, 11.6.20 Patient is scheduled for CPX labs, please order future labs, Thanks , Karna Christmas

## 2018-12-01 ENCOUNTER — Other Ambulatory Visit (INDEPENDENT_AMBULATORY_CARE_PROVIDER_SITE_OTHER): Payer: Medicare Other

## 2018-12-01 DIAGNOSIS — R7303 Prediabetes: Secondary | ICD-10-CM | POA: Diagnosis not present

## 2018-12-01 DIAGNOSIS — M8000XS Age-related osteoporosis with current pathological fracture, unspecified site, sequela: Secondary | ICD-10-CM | POA: Diagnosis not present

## 2018-12-01 DIAGNOSIS — E7800 Pure hypercholesterolemia, unspecified: Secondary | ICD-10-CM

## 2018-12-01 DIAGNOSIS — I10 Essential (primary) hypertension: Secondary | ICD-10-CM | POA: Diagnosis not present

## 2018-12-01 LAB — COMPREHENSIVE METABOLIC PANEL
ALT: 11 U/L (ref 0–35)
AST: 15 U/L (ref 0–37)
Albumin: 4.1 g/dL (ref 3.5–5.2)
Alkaline Phosphatase: 53 U/L (ref 39–117)
BUN: 18 mg/dL (ref 6–23)
CO2: 31 mEq/L (ref 19–32)
Calcium: 9.5 mg/dL (ref 8.4–10.5)
Chloride: 104 mEq/L (ref 96–112)
Creatinine, Ser: 0.69 mg/dL (ref 0.40–1.20)
GFR: 81.26 mL/min (ref >60.00)
Glucose, Bld: 92 mg/dL (ref 70–99)
Potassium: 4.1 mEq/L (ref 3.5–5.1)
Sodium: 140 mEq/L (ref 135–145)
Total Bilirubin: 0.6 mg/dL (ref 0.2–1.2)
Total Protein: 6.2 g/dL (ref 6.0–8.3)

## 2018-12-01 LAB — CBC WITH DIFFERENTIAL/PLATELET
Basophils Absolute: 0.1 10*3/uL (ref 0.0–0.1)
Basophils Relative: 1.4 % (ref 0.0–3.0)
Eosinophils Absolute: 0.1 10*3/uL (ref 0.0–0.7)
Eosinophils Relative: 2.1 % (ref 0.0–5.0)
HCT: 37.7 % (ref 36.0–46.0)
Hemoglobin: 12.3 g/dL (ref 12.0–15.0)
Lymphocytes Relative: 34.4 % (ref 12.0–46.0)
Lymphs Abs: 2 10*3/uL (ref 0.7–4.0)
MCHC: 32.7 g/dL (ref 30.0–36.0)
MCV: 84.8 fl (ref 78.0–100.0)
Monocytes Absolute: 0.4 10*3/uL (ref 0.1–1.0)
Monocytes Relative: 7.5 % (ref 3.0–12.0)
Neutro Abs: 3.2 10*3/uL (ref 1.4–7.7)
Neutrophils Relative %: 54.6 % (ref 43.0–77.0)
Platelets: 245 10*3/uL (ref 150.0–400.0)
RBC: 4.44 Mil/uL (ref 3.87–5.11)
RDW: 13.9 % (ref 11.5–15.5)
WBC: 5.8 10*3/uL (ref 4.0–10.5)

## 2018-12-01 LAB — VITAMIN D 25 HYDROXY (VIT D DEFICIENCY, FRACTURES): VITD: 44.38 ng/mL (ref 30.00–100.00)

## 2018-12-01 LAB — LIPID PANEL
Cholesterol: 196 mg/dL (ref 0–200)
HDL: 52.8 mg/dL (ref >39.00)
LDL Cholesterol: 125 mg/dL — ABNORMAL HIGH (ref 0–99)
NonHDL: 142.95
Total CHOL/HDL Ratio: 4
Triglycerides: 88 mg/dL (ref 0.0–149.0)
VLDL: 17.6 mg/dL (ref 0.0–40.0)

## 2018-12-01 LAB — TSH: TSH: 1.62 u[IU]/mL (ref 0.35–4.50)

## 2018-12-01 LAB — HEMOGLOBIN A1C: Hgb A1c MFr Bld: 5.6 % (ref 4.6–6.5)

## 2018-12-05 ENCOUNTER — Ambulatory Visit: Payer: Medicare Other

## 2018-12-08 ENCOUNTER — Ambulatory Visit: Payer: Medicare Other

## 2018-12-08 ENCOUNTER — Other Ambulatory Visit: Payer: Self-pay

## 2018-12-08 ENCOUNTER — Ambulatory Visit (INDEPENDENT_AMBULATORY_CARE_PROVIDER_SITE_OTHER): Payer: Medicare Other | Admitting: Family Medicine

## 2018-12-08 ENCOUNTER — Encounter: Payer: Self-pay | Admitting: Family Medicine

## 2018-12-08 VITALS — BP 118/62 | HR 75 | Temp 97.7°F | Ht 63.75 in | Wt 132.6 lb

## 2018-12-08 DIAGNOSIS — Z0000 Encounter for general adult medical examination without abnormal findings: Secondary | ICD-10-CM

## 2018-12-08 DIAGNOSIS — I10 Essential (primary) hypertension: Secondary | ICD-10-CM | POA: Diagnosis not present

## 2018-12-08 DIAGNOSIS — R42 Dizziness and giddiness: Secondary | ICD-10-CM

## 2018-12-08 DIAGNOSIS — M069 Rheumatoid arthritis, unspecified: Secondary | ICD-10-CM

## 2018-12-08 DIAGNOSIS — H8190 Unspecified disorder of vestibular function, unspecified ear: Secondary | ICD-10-CM

## 2018-12-08 DIAGNOSIS — E7800 Pure hypercholesterolemia, unspecified: Secondary | ICD-10-CM

## 2018-12-08 DIAGNOSIS — R7303 Prediabetes: Secondary | ICD-10-CM

## 2018-12-08 DIAGNOSIS — M8000XS Age-related osteoporosis with current pathological fracture, unspecified site, sequela: Secondary | ICD-10-CM | POA: Diagnosis not present

## 2018-12-08 MED ORDER — LOSARTAN POTASSIUM-HCTZ 50-12.5 MG PO TABS: 0.5000 | ORAL_TABLET | Freq: Every evening | ORAL | 3 refills | Status: DC

## 2018-12-08 NOTE — Patient Instructions (Addendum)
If you are interested in the new shingles vaccine (Shingrix) - call your local pharmacy to check on coverage and availability  If affordable, get on a wait list at your pharmacy to get the vaccine.  Physical therapy would be a good idea for balance /vestibular therapy  Let us know if you want to do this   Talk to your gyn about treatment with Prolia for osteoporosis if it is covered   Take a look at the info on advance directive   Keep taking good care of yourself

## 2018-12-08 NOTE — Progress Notes (Signed)
Subjective:    Patient ID: Sierra Bentley, female    DOB: 11-10-1933, 85 y.o.   MRN: [id number]  HPI Here for amw and health mt exam with rev of chronic medical problems   I have personally reviewed the Medicare Annual Wellness questionnaire and have noted 1. The patient's medical and social history 2. Their use of alcohol, tobacco or illicit drugs 3. Their current medications and supplements 4. The patient's functional ability including ADL's, fall risks, home safety risks and hearing or visual             impairment. 5. Diet and physical activities 6. Evidence for depression or mood disorders  The patients weight, height, BMI have been recorded in the chart and visual acuity is per eye clinic.  I have made referrals, counseling and provided education to the patient based review of the above and I have provided the pt with a written personalized care plan for preventive services. Reviewed and updated provider list, see scanned forms.  See scanned forms.  Routine anticipatory guidance given to patient.  See health maintenance. Colon cancer screening colonosc 2013 Breast cancer screening  Mammogram 10/20 ([facility])  Self breast exam- no lumps  Goes to gyn for annual exams -Dr Scherrie Bateman  Flu vaccine 10/20 Tetanus vaccine 4/18 Td Pneumovax completed Zoster vaccine 3/10 zostavax Dexa 7/20 -osteopenia T -1.9 lowest /followed by gyn (not currently on medication) Had briefly discussed Prolia -she may be interested  Falls more than one  Now she has times when she feels like she is falling backwards (especially if she is tired)  Using a cane -helpful  She did PT for balance in the past  Has had pelvic and hip fractures this year Intol of bisphosphonate in the past -fosamax and boniva  Supplements-vit D and ca  Exercise -not a lot / does walk with her cane  She does walk with buggy in grocery store  Advance directive - she is working on this- has appt to see about that  Cognitive  function addressed- see scanned forms- and if abnormal then additional documentation follows.  Memory is pretty good by self report  She walks into a room/forgets why / name recall  No confusion and does not get lost  Concentration is good-takes care of her own affairs   Has been shut in since February  A tough year for her  Now taking care of husb and herself (he is frail)  Walks with a cane   PMH and SH reviewed  Meds, vitals, and allergies reviewed.   ROS: See HPI.  Otherwise negative.    Weight : Wt Readings from Last 3 Encounters:  12/08/18 132 lb 9 oz (60.1 kg)  10/23/18 132 lb (59.9 kg)  08/23/18 133 lb (60.3 kg)  eats fairly healthy  Limited exercise  22.93 kg/m   Hearing/vision:  Hearing Screening   125Hz  250Hz  500Hz  1000Hz  2000Hz  3000Hz  4000Hz  6000Hz  8000Hz   Right ear:           Left ear:           Comments: Bilateral hearing aids  Vision Screening Comments: [facility] in August 2020 she was diagnosed with macular degeneration  She had some vision problems - is getting injections now  Hearing aides work well   She had [facility] screen- reassuring   bp is stable today  No cp or palpitations or headaches or edema  No side effects to medicines  BP Readings from Last 3  Encounters:  12/08/18 118/62  10/23/18 110/70  08/23/18 106/68     Pulse Readings from Last 3 Encounters:  12/08/18 75  03/31/18 82  03/01/18 86   Hyperlipidemia Lab Results  Component Value Date   CHOL 196 12/01/2018   CHOL 211 (H) 11/29/2017   CHOL 203 (H) 11/26/2016   Lab Results  Component Value Date   HDL 52.80 12/01/2018   HDL 50.90 11/29/2017   HDL 49.50 11/26/2016   Lab Results  Component Value Date   LDLCALC 125 (H) 12/01/2018   LDLCALC 141 (H) 11/29/2017   LDLCALC 132 (H) 11/26/2016   Lab Results  Component Value Date   TRIG 88.0 12/01/2018   TRIG 96.0 11/29/2017   TRIG 105.0 11/26/2016   Lab Results  Component Value Date   CHOLHDL 4  12/01/2018   CHOLHDL 4 11/29/2017   CHOLHDL 4 11/26/2016   Lab Results  Component Value Date   LDLDIRECT 130.4 02/25/2012   LDLDIRECT 134.1 08/18/2011   LDLDIRECT 155.9 11/30/2010   LDL was 116 for her [facility] screen  Eating less of the fatty foods  Drank milkshakes- stopped it  Also stopped sweet tea    Prediabetes Lab Results  Component Value Date   HGBA1C 5.6 12/01/2018  stopped sweet tea  This is down from 5.8   Other labs ok  Lab Results  Component Value Date   CREATININE 0.69 12/01/2018   BUN 18 12/01/2018   NA 140 12/01/2018   K 4.1 12/01/2018   CL 104 12/01/2018   CO2 31 12/01/2018   Lab Results  Component Value Date   ALT 11 12/01/2018   AST 15 12/01/2018   ALKPHOS 53 12/01/2018   BILITOT 0.6 12/01/2018    Lab Results  Component Value Date   WBC 5.8 12/01/2018   HGB 12.3 12/01/2018   HCT 37.7 12/01/2018   MCV 84.8 12/01/2018   PLT 245.0 12/01/2018   Lab Results  Component Value Date   TSH 1.62 12/01/2018     Patient Active Problem List   Diagnosis Date Noted  . Vestibular dizziness 03/31/2018  . Hip fracture, right ([place]) 02/27/2018  . Tingling of both feet 10/07/2017  . Pedal edema 09/28/2017  . Fall at home 09/28/2017  . Prediabetes 11/30/2016  . Toenail fungus 10/03/2015  . At moderate risk for fall 09/05/2015  . Cervical spondylosis without myelopathy 08/05/2015  . Routine general medical examination at a health care facility 08/27/2014  . Colon cancer screening 08/27/2014  . Fall against object 08/15/2014  . Encounter for Medicare annual wellness exam 07/25/2013  . Varicosities of leg 05/23/2012  . Abnormal ultrasound of thyroid gland 03/01/2011  . Hyperlipidemia 06/11/2008  . ALLERGIC RHINITIS 03/29/2008  . Essential hypertension, benign 12/26/2006  . GERD 12/26/2006  . Rheumatoid arthritis ([place]) 12/26/2006  . Osteoporosis 12/26/2006  . URINARY INCONTINENCE 12/26/2006   Past Medical History:  Diagnosis Date  . Allergic  rhinitis, cause unspecified   . Cramp of limb   . Diverticulosis   . Epistaxis   . GERD (gastroesophageal reflux disease)   . HTN (hypertension)   . Osteopenia   . Other and unspecified hyperlipidemia   . Other malaise and fatigue   . Rheumatoid arthritis(714.0)   . Urine incontinence    Past Surgical History:  Procedure Laterality Date  . BREAST BIOPSY  2/11   fibrocystic change  . BREAST BIOPSY  8/11   fibrocystic change  . COLONOSCOPY  2003  . CYSTOSCOPY  2006  .  MOLE REMOVAL     rectal  . TONSILLECTOMY     Social History   Tobacco Use  . Smoking status: Never Smoker  . Smokeless tobacco: Never Used  Substance Use Topics  . Alcohol use: No    Alcohol/week: 0.0 standard drinks  . Drug use: No   Family History  Problem Relation Age of Onset  . Heart attack Father 16  . Coronary artery disease Father   . Other Mother        Arrythmia  . Coronary artery disease Mother   . Hypertension Mother   . Anxiety disorder Mother   . Breast cancer Other        2nd cousin  . Depression Other        family history   Allergies  Allergen Reactions  . Ace Inhibitors     REACTION: cough  . Alendronate Sodium     REACTION: reflux, trouble swallowing  . Chocolate   . Dust Mite Extract   . Ibandronate Sodium     REACTION: reflux and increased BP  . Oxycodone Other (See Comments)    Dizziness resulting in fall Dizziness resulting in fall    Current Outpatient Medications on File Prior to Visit  Medication Sig Dispense Refill  . Multiple Vitamin (MULTIVITAMIN) tablet Take 1 tablet by mouth daily.      . pantoprazole (PROTONIX) 40 MG tablet Take 40 mg by mouth every evening.   11  . Polyethyl Glycol-Propyl Glycol (SYSTANE ULTRA OP) Place 1 drop into both eyes 4 (four) times daily.      . sodium chloride (MURO 128) 5 % ophthalmic ointment Place 1 application into both eyes at bedtime.     . TURMERIC PO Take 2 capsules by mouth daily before breakfast.     No current  facility-administered medications on file prior to visit.      Review of Systems  Constitutional: Negative for activity change, appetite change, fatigue, fever and unexpected weight change.  HENT: Negative for congestion, ear pain, rhinorrhea, sinus pressure and sore throat.   Eyes: Negative for pain, redness and visual disturbance.  Respiratory: Negative for cough, shortness of breath and wheezing.   Cardiovascular: Negative for chest pain and palpitations.  Gastrointestinal: Negative for abdominal pain, blood in stool, constipation and diarrhea.  Endocrine: Negative for polydipsia and polyuria.  Genitourinary: Negative for dysuria, frequency and urgency.  Musculoskeletal: Positive for arthralgias. Negative for back pain and myalgias.  Skin: Negative for pallor and rash.  Allergic/Immunologic: Negative for environmental allergies.  Neurological: Negative for dizziness, syncope and headaches.       Poor balance  Hematological: Negative for adenopathy. Does not bruise/bleed easily.  Psychiatric/Behavioral: Negative for decreased concentration and dysphoric mood. The patient is not nervous/anxious.        Objective:   Physical Exam Constitutional:      General: She is not in acute distress.    Appearance: Normal appearance. She is well-developed and normal weight. She is not ill-appearing or diaphoretic.  HENT:     Head: Normocephalic and atraumatic.     Right Ear: Tympanic membrane, ear canal and external ear normal.     Left Ear: Tympanic membrane, ear canal and external ear normal.     Nose: Nose normal. No congestion.     Mouth/Throat:     Mouth: Mucous membranes are moist.     Pharynx: Oropharynx is clear. No posterior oropharyngeal erythema.  Eyes:     General: No scleral icterus.  Extraocular Movements: Extraocular movements intact.     Conjunctiva/sclera: Conjunctivae normal.     Pupils: Pupils are equal, round, and reactive to light.  Neck:     Musculoskeletal:  Normal range of motion and neck supple. No neck rigidity or muscular tenderness.     Thyroid: No thyromegaly.     Vascular: No carotid bruit or JVD.  Cardiovascular:     Rate and Rhythm: Normal rate and regular rhythm.     Pulses: Normal pulses.     Heart sounds: Normal heart sounds. No gallop.   Pulmonary:     Effort: Pulmonary effort is normal. No respiratory distress.     Breath sounds: Normal breath sounds. No wheezing.     Comments: Good air exch Chest:     Chest wall: No tenderness.  Abdominal:     General: Bowel sounds are normal. There is no distension or abdominal bruit.     Palpations: Abdomen is soft. There is no mass.     Tenderness: There is no abdominal tenderness.     Hernia: No hernia is present.  Genitourinary:    Comments: Breast exam: No mass, nodules, thickening, tenderness, bulging, retraction, inflamation, nipple discharge or skin changes noted.  No axillary or clavicular LA.     Musculoskeletal: Normal range of motion.        General: No tenderness.     Right lower leg: No edema.     Left lower leg: No edema.  Lymphadenopathy:     Cervical: No cervical adenopathy.  Skin:    General: Skin is warm and dry.     Coloration: Skin is not pale.     Findings: No erythema or rash.     Comments: Fair complexion  Some keratoses and angiomas  Neurological:     Mental Status: She is alert. Mental status is at baseline.     Cranial Nerves: No cranial nerve deficit.     Motor: No abnormal muscle tone.     Coordination: Coordination normal.     Gait: Gait normal.     Deep Tendon Reflexes: Reflexes are normal and symmetric. Reflexes normal.  Psychiatric:        Mood and Affect: Mood normal.        Cognition and Memory: Cognition and memory normal.           Assessment & Plan:   Problem List Items Addressed This Visit      Cardiovascular and Mediastinum   Essential hypertension, benign    bp in fair control at this time  BP Readings from Last 1 Encounters:   12/08/18 118/62   No changes needed Most recent labs reviewed  Disc lifstyle change with low sodium diet and exercise        Relevant Medications   losartan-hydrochlorothiazide (HYZAAR) 50-12.5 MG tablet     Nervous and Auditory   Vestibular dizziness    Not dizzy at this time but balance is generally poor  Has had falls Offered PT for balance- she will think about this (has done it before) Disc fall prev        Musculoskeletal and Integument   Rheumatoid arthritis ([facility])    Stable clinically w/o tx      Osteoporosis    follwed by gyn  dexa 7/20 Enc her strongly to disc prolia with her gyn  Has had falls and h/o pelvic and hip fx  Intol of oral bisphosphonates Taking ca and D  Exercise -waling with cane  Other   Hyperlipidemia    Disc goals for lipids and reasons to control them Rev last labs with pt Rev low sat fat diet in detail LDL improved (even lower at [facility])  She has cut out milkshakes and fatty foods Continue to monitor       Relevant Medications   losartan-hydrochlorothiazide (HYZAAR) 50-12.5 MG tablet   Encounter for Medicare annual wellness exam    Reviewed health habits including diet and exercise and skin cancer prevention Reviewed appropriate screening tests for age  Also reviewed health mt list, fam hx and immunization status , as well as social and family history   See HPI Labs reviewed  Discussed shingrix vaccine  Enc her to disc prolia for OP with her gyn  Disc fall prev-offered PT for balance/ vestib training  Continues ca and D  Working on Insurance underwriter- given materials to review No cognitive concerns  Hearing aides work well for her  Regular eye care with macular degeneration-stable vision       Routine general medical examination at a health care facility - [facility]    Reviewed health habits including diet and exercise and skin cancer prevention Reviewed appropriate screening tests for age  Also reviewed health mt list,  fam hx and immunization status , as well as social and family history   See HPI Labs reviewed  Discussed shingrix vaccine  Enc her to disc prolia for OP with her gyn  Disc fall prev-offered PT for balance/ vestib training  Continues ca and D  Working on [employer] directive- given materials to review No cognitive concerns  Hearing aides work well for her  Regular eye care with macular degeneration-stable vision          Prediabetes    Lab Results  Component Value Date   HGBA1C 5.6 12/01/2018   disc imp of low glycemic diet and wt loss to prevent DM2

## 2018-12-10 NOTE — Assessment & Plan Note (Signed)
bp in fair control at this time  BP Readings from Last 1 Encounters:  12/08/18 118/62   No changes needed Most recent labs reviewed  Disc lifstyle change with low sodium diet and exercise

## 2018-12-10 NOTE — Assessment & Plan Note (Signed)
Reviewed health habits including diet and exercise and skin cancer prevention Reviewed appropriate screening tests for age  Also reviewed health mt list, fam hx and immunization status , as well as social and family history   See HPI Labs reviewed  Discussed shingrix vaccine  Enc her to disc prolia for OP with her gyn  Disc fall prev-offered PT for balance/ vestib training  Continues ca and D  Working on Insurance underwriter- given materials to review No cognitive concerns  Hearing aides work well for her  Regular eye care with macular degeneration-stable vision

## 2018-12-10 NOTE — Assessment & Plan Note (Signed)
follwed by gyn  dexa 7/20 Enc her strongly to disc prolia with her gyn  Has had falls and h/o pelvic and hip fx  Intol of oral bisphosphonates Taking ca and D  Exercise -waling with cane

## 2018-12-10 NOTE — Assessment & Plan Note (Signed)
Not dizzy at this time but balance is generally poor  Has had falls Offered PT for balance- she will think about this (has done it before) Disc fall prev

## 2018-12-10 NOTE — Assessment & Plan Note (Signed)
Disc goals for lipids and reasons to control them Rev last labs with pt Rev low sat fat diet in detail LDL improved (even lower at [facility])  She has cut out milkshakes and fatty foods Continue to monitor

## 2018-12-10 NOTE — Assessment & Plan Note (Signed)
Lab Results  Component Value Date   HGBA1C 5.6 12/01/2018   disc imp of low glycemic diet and wt loss to prevent DM2

## 2018-12-10 NOTE — Assessment & Plan Note (Signed)
Stable clinically w/o tx

## 2019-02-05 DIAGNOSIS — H04129 Dry eye syndrome of unspecified lacrimal gland: Secondary | ICD-10-CM | POA: Diagnosis not present

## 2019-02-05 DIAGNOSIS — H353211 Exudative age-related macular degeneration, right eye, with active choroidal neovascularization: Secondary | ICD-10-CM | POA: Diagnosis not present

## 2019-02-05 DIAGNOSIS — H353122 Nonexudative age-related macular degeneration, left eye, intermediate dry stage: Secondary | ICD-10-CM | POA: Diagnosis not present

## 2019-02-05 DIAGNOSIS — H5789 Other specified disorders of eye and adnexa: Secondary | ICD-10-CM | POA: Diagnosis not present

## 2019-02-05 DIAGNOSIS — Z961 Presence of intraocular lens: Secondary | ICD-10-CM | POA: Diagnosis not present

## 2019-02-05 DIAGNOSIS — H18513 Endothelial corneal dystrophy, bilateral: Secondary | ICD-10-CM | POA: Diagnosis not present

## 2019-02-05 DIAGNOSIS — H538 Other visual disturbances: Secondary | ICD-10-CM | POA: Diagnosis not present

## 2019-02-05 DIAGNOSIS — Z79899 Other long term (current) drug therapy: Secondary | ICD-10-CM | POA: Diagnosis not present

## 2019-02-27 DIAGNOSIS — H40019 Open angle with borderline findings, low risk, unspecified eye: Secondary | ICD-10-CM | POA: Diagnosis not present

## 2019-03-12 DIAGNOSIS — H353211 Exudative age-related macular degeneration, right eye, with active choroidal neovascularization: Secondary | ICD-10-CM | POA: Diagnosis not present

## 2019-04-16 DIAGNOSIS — H353211 Exudative age-related macular degeneration, right eye, with active choroidal neovascularization: Secondary | ICD-10-CM | POA: Diagnosis not present

## 2019-04-16 DIAGNOSIS — H353120 Nonexudative age-related macular degeneration, left eye, stage unspecified: Secondary | ICD-10-CM | POA: Diagnosis not present

## 2019-05-02 ENCOUNTER — Ambulatory Visit: Payer: Medicare PPO | Admitting: Family Medicine

## 2019-05-02 ENCOUNTER — Other Ambulatory Visit: Payer: Self-pay

## 2019-05-07 ENCOUNTER — Ambulatory Visit (INDEPENDENT_AMBULATORY_CARE_PROVIDER_SITE_OTHER): Payer: Medicare PPO | Admitting: Family Medicine

## 2019-05-07 ENCOUNTER — Other Ambulatory Visit: Payer: Self-pay

## 2019-05-07 ENCOUNTER — Encounter: Payer: Self-pay | Admitting: Family Medicine

## 2019-05-07 VITALS — BP 116/58 | HR 79 | Temp 98.4°F | Ht 63.75 in | Wt 127.2 lb

## 2019-05-07 DIAGNOSIS — I83893 Varicose veins of bilateral lower extremities with other complications: Secondary | ICD-10-CM | POA: Diagnosis not present

## 2019-05-07 DIAGNOSIS — J301 Allergic rhinitis due to pollen: Secondary | ICD-10-CM

## 2019-05-07 DIAGNOSIS — R2242 Localized swelling, mass and lump, left lower limb: Secondary | ICD-10-CM | POA: Insufficient documentation

## 2019-05-07 DIAGNOSIS — I10 Essential (primary) hypertension: Secondary | ICD-10-CM | POA: Diagnosis not present

## 2019-05-07 DIAGNOSIS — R2689 Other abnormalities of gait and mobility: Secondary | ICD-10-CM

## 2019-05-07 DIAGNOSIS — M069 Rheumatoid arthritis, unspecified: Secondary | ICD-10-CM

## 2019-05-07 NOTE — Assessment & Plan Note (Signed)
Multifactorial  Age and mobility play a role  Uses a cane and has h/o pelvic fx in the past  One fall in kitchen recently  Cares for husband full time (home dialysis) and does all the household duties etc  Interested in PT to help strength and balance Ref done

## 2019-05-07 NOTE — Progress Notes (Signed)
Subjective:    Patient ID: Sierra Bentley, female    DOB: 27-Nov-1933, 86 y.o.   MRN: [id number]  This visit occurred during the SARS-CoV-2 public health emergency.  Safety protocols were in place, including screening questions prior to the visit, additional usage of staff PPE, and extensive cleaning of exam room while observing appropriate contact time as indicated for disinfecting solutions.    HPI Pt presents for a lump on her left thigh  Also having congestion in sinuses/allergies   Interested in PT for balance  Last fall- recent-fell backwards standing in the kitchen Cane helps  Has to care for immobile husband    Wt Readings from Last 3 Encounters:  05/07/19 127 lb 3 oz (57.7 kg)  12/08/18 132 lb 9 oz (60.1 kg)  10/23/18 132 lb (59.9 kg)   22.00 kg/m   Lump on L thigh  Is lon lateral L thigh -under the skin  Sometimes more pronounced than others  No pain and not tender   Her L foot goes to sleep occasionally  She wears compression stockings (has some swelling)    Gets pain in L sinuses  Takes mucinex  It may have tylenol in it  Does not want to over take   Nasal spray -makes nose bleed  claritin- nose bleeds (most antihistamines)  Uses a saline gel    BP Readings from Last 3 Encounters:  05/07/19 (!) 116/58  12/08/18 118/62  10/23/18 110/70   Patient Active Problem List   Diagnosis Date Noted  . Lump of left thigh 05/07/2019  . Poor balance 05/07/2019  . Vestibular dizziness 03/31/2018  . Hip fracture, right ([place]) 02/27/2018  . Pedal edema 09/28/2017  . Fall at home 09/28/2017  . Prediabetes 11/30/2016  . Toenail fungus 10/03/2015  . At moderate risk for fall 09/05/2015  . Cervical spondylosis without myelopathy 08/05/2015  . Routine general medical examination at a health care facility 08/27/2014  . Colon cancer screening 08/27/2014  . Fall against object 08/15/2014  . Encounter for Medicare annual wellness exam 07/25/2013  . Varicosities of  leg 05/23/2012  . Abnormal ultrasound of thyroid gland 03/01/2011  . Hyperlipidemia 06/11/2008  . Allergic rhinitis 03/29/2008  . Essential hypertension, benign 12/26/2006  . GERD 12/26/2006  . Rheumatoid arthritis ([place]) 12/26/2006  . Osteoporosis 12/26/2006  . URINARY INCONTINENCE 12/26/2006   Past Medical History:  Diagnosis Date  . Allergic rhinitis, cause unspecified   . Cramp of limb   . Diverticulosis   . Epistaxis   . GERD (gastroesophageal reflux disease)   . HTN (hypertension)   . Osteopenia   . Other and unspecified hyperlipidemia   . Other malaise and fatigue   . Rheumatoid arthritis(714.0)   . Urine incontinence    Past Surgical History:  Procedure Laterality Date  . BREAST BIOPSY  2/11   fibrocystic change  . BREAST BIOPSY  8/11   fibrocystic change  . COLONOSCOPY  2003  . CYSTOSCOPY  2006  . MOLE REMOVAL     rectal  . TONSILLECTOMY     Social History   Tobacco Use  . Smoking status: Never Smoker  . Smokeless tobacco: Never Used  Substance Use Topics  . Alcohol use: No    Alcohol/week: 0.0 standard drinks  . Drug use: No   Family History  Problem Relation Age of Onset  . Heart attack Father 70  . Coronary artery disease Father   . Other Mother  Arrythmia  . Coronary artery disease Mother   . Hypertension Mother   . Anxiety disorder Mother   . Breast cancer Other        2nd cousin  . Depression Other        family history   Allergies  Allergen Reactions  . Ace Inhibitors     REACTION: cough  . Alendronate Sodium     REACTION: reflux, trouble swallowing  . Chocolate   . Dust Mite Extract   . Ibandronate Sodium     REACTION: reflux and increased BP  . Oxycodone Other (See Comments)    Dizziness resulting in fall Dizziness resulting in fall    Current Outpatient Medications on File Prior to Visit  Medication Sig Dispense Refill  . losartan-hydrochlorothiazide (HYZAAR) 50-12.5 MG tablet Take 0.5 tablets by mouth every evening.  45 tablet 3  . Multiple Vitamin (MULTIVITAMIN) tablet Take 1 tablet by mouth daily.      . pantoprazole (PROTONIX) 40 MG tablet Take 40 mg by mouth every evening.   11  . Polyethyl Glycol-Propyl Glycol (SYSTANE ULTRA OP) Place 1 drop into both eyes 4 (four) times daily.      . sodium chloride (MURO 128) 5 % ophthalmic ointment Place 1 application into both eyes at bedtime.     . TURMERIC PO Take 2 capsules by mouth daily before breakfast.     No current facility-administered medications on file prior to visit.    Review of Systems  Constitutional: Negative for activity change, appetite change, fatigue, fever and unexpected weight change.  HENT: Positive for congestion, sinus pressure and sneezing. Negative for ear pain, rhinorrhea and sore throat.   Eyes: Negative for pain, redness and visual disturbance.  Respiratory: Negative for cough, shortness of breath and wheezing.   Cardiovascular: Positive for leg swelling. Negative for chest pain and palpitations.       Varicose veins  Gastrointestinal: Negative for abdominal pain, blood in stool, constipation and diarrhea.  Endocrine: Negative for polydipsia and polyuria.  Genitourinary: Negative for dysuria, frequency and urgency.  Musculoskeletal: Positive for arthralgias and gait problem. Negative for back pain, joint swelling and myalgias.  Skin: Negative for pallor, rash and wound.  Allergic/Immunologic: Negative for environmental allergies.  Neurological: Negative for dizziness, syncope and headaches.  Hematological: Negative for adenopathy. Does not bruise/bleed easily.  Psychiatric/Behavioral: Negative for decreased concentration and dysphoric mood. The patient is not nervous/anxious.        Objective:   Physical Exam Constitutional:      General: She is not in acute distress.    Appearance: Normal appearance. She is normal weight. She is not ill-appearing.  HENT:     Head: Normocephalic and atraumatic.     Right Ear: Tympanic  membrane normal.     Left Ear: Tympanic membrane normal.     Ears:     Comments: Partial cerumen impaction bilat     Nose:     Comments: Nares are boggy/slt injected     Mouth/Throat:     Mouth: Mucous membranes are moist.  Eyes:     General:        Right eye: No discharge.        Left eye: No discharge.     Extraocular Movements: Extraocular movements intact.     Conjunctiva/sclera: Conjunctivae normal.     Pupils: Pupils are equal, round, and reactive to light.  Cardiovascular:     Rate and Rhythm: Normal rate and regular rhythm.  Pulses: Normal pulses.     Heart sounds: Normal heart sounds.     Comments: Spider veins in lower legs bilat Pulmonary:     Effort: Pulmonary effort is normal. No respiratory distress.     Breath sounds: Normal breath sounds. No wheezing or rales.  Musculoskeletal:        General: No tenderness.     Cervical back: Normal range of motion and neck supple.     Right lower leg: No edema.     Left lower leg: No edema.  Lymphadenopathy:     Cervical: No cervical adenopathy.  Skin:    General: Skin is warm and dry.     Coloration: Skin is not pale.     Findings: No erythema or rash.     Comments: Soft tissue lump in lateral L thigh- oval/ firm but rubbery and non tender    Neurological:     Mental Status: She is alert. Mental status is at baseline.     Cranial Nerves: No cranial nerve deficit.     Sensory: No sensory deficit.     Comments: Steady gait with cane today  Psychiatric:        Mood and Affect: Mood normal.           Assessment & Plan:   Problem List Items Addressed This Visit      Cardiovascular and Mediastinum   Essential hypertension, benign    bp in fair control at this time  BP Readings from Last 1 Encounters:  05/07/19 (!) 116/58   No changes needed Most recent labs reviewed  Disc lifstyle change with low sodium diet and exercise        Varicosities of leg    Wears supp hose  occ L foot goes "to sleep" but  improves after position change         Respiratory   Allergic rhinitis    Limited tx options as pt gets nosebleeds from antihistamines and nasal sprays  She can use ayr gel -helpful  inst that mucinex is ok to use for congestion inst to alert if s/s of infection  Adv to use mask outside until pollen settles down also        Musculoskeletal and Integument   Rheumatoid arthritis ([facility])    Still stable without treatment         Other   Lump of left thigh - Primary    Soft tissue lateral L thigh -with no known hematoma or trauma  Disc poss of a seb cyst vs lipoma or other Not bothersome  Will ref to derm for further eval  inst to call if she feels pain or any other changes       Relevant Orders   Ambulatory referral to Dermatology   Poor balance    Multifactorial  Age and mobility play a role  Uses a cane and has h/o pelvic fx in the past  One fall in kitchen recently  Cares for husband full time (home dialysis) and does all the household duties etc  Interested in PT to help strength and balance Ref done      Relevant Orders   Ambulatory referral to Physical Therapy

## 2019-05-07 NOTE — Assessment & Plan Note (Signed)
bp in fair control at this time  BP Readings from Last 1 Encounters:  05/07/19 (!) 116/58   No changes needed Most recent labs reviewed  Disc lifstyle change with low sodium diet and exercise

## 2019-05-07 NOTE — Assessment & Plan Note (Signed)
Limited tx options as pt gets nosebleeds from antihistamines and nasal sprays  She can use ayr gel -helpful  inst that mucinex is ok to use for congestion inst to alert if s/s of infection  Adv to use mask outside until pollen settles down also

## 2019-05-07 NOTE — Assessment & Plan Note (Signed)
Still stable without treatment

## 2019-05-07 NOTE — Assessment & Plan Note (Signed)
Wears supp hose  occ L foot goes "to sleep" but improves after position change

## 2019-05-07 NOTE — Patient Instructions (Addendum)
I placed a referral to dermatology and PT - the office will call you in the next 1-2 weeks to set that up   If lump gets bigger or painful let me know in the meantime   Keep using the mucinex product for sinus issues   Take care of yourself and stay safe

## 2019-05-07 NOTE — Assessment & Plan Note (Signed)
Soft tissue lateral L thigh -with no known hematoma or trauma  Disc poss of a seb cyst vs lipoma or other Not bothersome  Will ref to derm for further eval  inst to call if she feels pain or any other changes

## 2019-05-08 DIAGNOSIS — H4089 Other specified glaucoma: Secondary | ICD-10-CM | POA: Diagnosis not present

## 2019-05-08 DIAGNOSIS — H18519 Endothelial corneal dystrophy, unspecified eye: Secondary | ICD-10-CM | POA: Diagnosis not present

## 2019-05-08 DIAGNOSIS — H353122 Nonexudative age-related macular degeneration, left eye, intermediate dry stage: Secondary | ICD-10-CM | POA: Diagnosis not present

## 2019-05-08 DIAGNOSIS — Z736 Limitation of activities due to disability: Secondary | ICD-10-CM | POA: Diagnosis not present

## 2019-05-08 DIAGNOSIS — H539 Unspecified visual disturbance: Secondary | ICD-10-CM | POA: Diagnosis not present

## 2019-05-08 DIAGNOSIS — H353211 Exudative age-related macular degeneration, right eye, with active choroidal neovascularization: Secondary | ICD-10-CM | POA: Diagnosis not present

## 2019-05-16 ENCOUNTER — Ambulatory Visit: Payer: Medicare PPO | Attending: Family Medicine

## 2019-05-16 ENCOUNTER — Encounter: Payer: Self-pay | Admitting: Physical Therapy

## 2019-05-16 ENCOUNTER — Other Ambulatory Visit: Payer: Self-pay

## 2019-05-16 DIAGNOSIS — R2689 Other abnormalities of gait and mobility: Secondary | ICD-10-CM | POA: Insufficient documentation

## 2019-05-16 DIAGNOSIS — M6281 Muscle weakness (generalized): Secondary | ICD-10-CM | POA: Diagnosis not present

## 2019-05-16 NOTE — Patient Instructions (Signed)
Access Code: [email or web address] URL: [email or web address] Date: 05/16/2019 Prepared by: Lieutenant Diego  Exercises Seated March - 1 x daily - 7 x weekly - 2 sets - 10 reps Seated Long Arc Quad - 1 x daily - 7 x weekly - 2 sets - 10 reps Seated Heel Toe Raises - 1 x daily - 7 x weekly - 2 sets - 10 reps Sit to Stand with Armchair - 1 x daily - 7 x weekly - 2 sets - 5 reps

## 2019-05-16 NOTE — Therapy (Signed)
[place] MAIN [facility] SERVICES [address] [place], [place], [address] Phone: [phone]   Fax:  [phone]  Physical Therapy Evaluation  Patient Details  Name: Sierra Bentley MRN: [id number] Date of Birth: 1933-02-21 No data recorded  Encounter Date: 05/16/2019  PT End of Session - 05/16/19 1427    Visit Number  1    Number of Visits  16    Date for PT Re-Evaluation  07/11/19    Authorization Type  Medicare, eval [id number], cert [id number]    PT Start Time  1430    PT Stop Time  1530    PT Time Calculation (min)  60 min    Equipment Utilized During Treatment  Gait belt    Activity Tolerance  Patient tolerated treatment well    Behavior During Therapy  WFL for tasks assessed/performed       Past Medical History:  Diagnosis Date  . Allergic rhinitis, cause unspecified   . Cramp of limb   . Diverticulosis   . Epistaxis   . GERD (gastroesophageal reflux disease)   . HTN (hypertension)   . Osteopenia   . Other and unspecified hyperlipidemia   . Other malaise and fatigue   . Rheumatoid arthritis(714.0)   . Urine incontinence     Past Surgical History:  Procedure Laterality Date  . BREAST BIOPSY  2/11   fibrocystic change  . BREAST BIOPSY  8/11   fibrocystic change  . COLONOSCOPY  2003  . CYSTOSCOPY  2006  . MOLE REMOVAL     rectal  . TONSILLECTOMY      There were no vitals filed for this visit.   Subjective Assessment - 05/16/19 1426    Subjective  Patient reported that she fell 02/26/2018 and experienced a pelvic fx, and potentially a femur fx. Stated she went to rehab after that. Has had concerns about her balance since before that fall. Has had several falls in the last 6 months    Pertinent History  Patient is 86 yo female that  was referred to PT for strength and balance. PMH of fall abut a year ago and pelvic fx and R hip fx, GERD, HTN, OA, osteopenia, RA. Pt is husbands caregiver, performs all household tasks,  independent (will walk around house without [facility]). Pt does have a history of visual issues. Patient reported that she falls, especially with head turns, tends to fall backwards. Pt does complain of dizziness.    Limitations  Standing;Walking;House hold activities    How long can you sit comfortably?  NA    How long can you walk comfortably?  48mins (guesstimate)    Diagnostic tests  NA    Patient Stated Goals  not fall, walk better    Currently in Pain?  No/denies         [facility] PT Assessment - 05/16/19 0001      Assessment   Hand Dominance  Right    Prior Therapy  --   at least 10 years ago     Precautions   Precautions  None      Restrictions   Weight Bearing Restrictions  No      Balance Screen   Has the patient fallen in the past 6 months  Yes    How many times?  4-5    Has the patient had a decrease in activity level because of a fear of falling?   Yes    Is the patient reluctant to leave  their home because of a fear of falling?   No      Home Social worker  Private residence    Living Arrangements  Spouse/significant other    Available Help at Discharge  Family    Type of [place] to enter    Entrance Stairs-Number of Steps  4    Entrance Stairs-Rails  Can reach both    [place]  Two level;Able to live on main level with bedroom/bathroom    [place] - single point;Walker - 2 wheels   bird baths     Prior Function   Level of Independence  Independent with household mobility with device;Independent with household mobility without device;Independent    Vocation  Retired      Charity fundraiser Status  Within Functional Limits for tasks assessed      Standardized Balance Assessment   Standardized Balance Assessment  Dynamic Gait Index;Berg Balance Test      Berg Balance Test   Sit to Stand  Able to stand using hands after several tries    Standing Unsupported  Able to stand 2 minutes with  supervision    Sitting with Back Unsupported but Feet Supported on Floor or Stool  Able to sit safely and securely 2 minutes    Stand to Sit  Controls descent by using hands    Transfers  Able to transfer safely, definite need of hands    Standing Unsupported with Eyes Closed  Needs help to keep from falling    Standing Unsupported with Feet Together  Needs help to attain position but able to stand for 30 seconds with feet together    From Standing, Reach Forward with Outstretched Arm  Can reach forward >12 cm safely (5")    From Standing Position, Pick up Object from [place] to pick up shoe safely and easily    From Standing Position, Turn to Look Behind Over each Shoulder  Looks behind one side only/other side shows less weight shift    Turn 360 Degrees  Needs close supervision or verbal cueing    Standing Unsupported, Alternately Place Feet on Step/Stool  Able to stand independently and complete 8 steps >20 seconds    Standing Unsupported, One Foot in Front  Able to take small step independently and hold 30 seconds    Standing on One Leg  Able to lift leg independently and hold equal to or more than 3 seconds    Total Score  34      Dynamic Gait Index   Level Surface  Moderate Impairment    Change in Gait Speed  Moderate Impairment    Gait with Horizontal Head Turns  Moderate Impairment    Gait with Vertical Head Turns  Severe Impairment    Gait and Pivot Turn  Severe Impairment    Step Over Obstacle  Moderate Impairment    Step Around Obstacles  Moderate Impairment    Steps  Moderate Impairment    Total Score  6       SUBJECTIVE Chief complaint: Patient's main complaint that if she turns her head suddenly, she falls. Stated that 3 months ago she fell in the kitchen, backwards, and wasn't moving at all (lifting crockpot).   Onset: Imaging: None Recent changes in overall health/medication: No Directional pattern for falls: pt reported she falls posteriorly, occasionally  sideways. Prior history of physical therapy: several  years ago Red flags (bowel/bladder changes, saddle paresthesia, personal history of cancer, chills/fever, night sweats, unrelenting pain) Negative  OBJECTIVE  MUSCULOSKELETAL: Tremor: Absent Bulk: Normal Tone: Normal, no clonus  Posture No gross abnormalities noted in standing or seated posture  Gait Pt ambulates with SPC, able to ambulate without. Exhibited decreased gait velocity, decreased step height/length bilaterally, mild deviations in gait path noted as well.  Strength R/L 3+/4 Hip flexion  4/4 Hip abduction in sitting 4/4 Hip adduction in sitting 3+/4 Knee extension 3+/4- Knee flexion 4/4 Ankle Plantarflexion 4-/4 Ankle Dorsiflexion 4-/4- ankle inversion  3+/3+ ankle eversion   NEUROLOGICAL:  Mental Status Patient is oriented to person, place and time.  Recent memory is intact.  Remote memory is intact.  Attention span and concentration are intact.  Expressive speech is intact.  Patient's fund of knowledge is within normal limits for educational level.  Cranial Nerves Pt able to demonstrate tracking, did report that it made her feel "funny" Facial strength is intact bilaterally  Hearing is normal as tested by gross conversation Palate elevates midline, normal phonation  Shoulder shrug strength is intact  Tongue protrudes midline   Sensation Grossly intact to light touch bilateral UEs/LEs as determined by testing dermatomes C2-T2/L2-S2 respectively Proprioception and hot/cold testing deferred on this date Pt endorses occasional numbness in L hand and L lower leg and foot. States it happens mostly at night   Coordination/Cerebellar Finger to Nose: WNL Heel to Shin: WNL Rapid alternating movements: WNL Finger Opposition: WNL Pronator Drift: Negative  FUNCTIONAL OUTCOME MEASURES   Results Comments  BERG 34/56 Fall risk, in need of intervention  DGI 6/24           5TSTS 17 seconds with both  hands, reported BLE knee pain with transfers       10 Meter Gait Speed Self-selected: s =  .31m/s; Fastest: s = .43 m/s Below normative values for full community ambulation   Objective measurements completed on examination: See above findings.    TREATMENT:  HEP: Access Code: [email or web address] URL: [email or web address] Date: 05/16/2019 Prepared by: Lieutenant Diego  Exercises Seated March x20 Seated Long Arc Quad x20 Seated Heel Toe Raises x20 Sit to Stand with Armchair x5   FOTO SCORE: 29   clinical impression: The patient is an 86 yo female referred to physical therapy for strength and balance training. Pt has been falling at home, and does also complain of dizziness. Independent for household ambulation, utilizes [facility]. Upon assessment the patient demonstrated deficits in gait, balance, endurance and LE strength, as well as low scores on outcome measures. These deficits limit the patient's ability to perform functional activities (such as walking, driving, transfers, reaching overhead, standing, lifting, etc) and increase patient's risk of falling. The patient would benefit from further skilled PT intervention to maximize functional abilities and address safety concerns.     PT Education - 05/16/19 1426    Education Details  PT role, POC, HEP    Person(s) Educated  Patient    Methods  Explanation;Demonstration;Tactile cues;Verbal cues;Handout    Comprehension  Verbalized understanding;Returned demonstration;Verbal cues required;Need further instruction       PT Short Term Goals - 05/16/19 1429      PT SHORT TERM GOAL #1   Title  Pt will be independent with initial HEP in order to improve strength and balance in order to decrease fall risk and improve function at home and work.    Baseline  administered on eval 4/21    Time  4    Period  Weeks    Status  New    Target Date  06/13/19        PT Long Term Goals - 05/16/19 1429      PT LONG TERM GOAL #1   Title  Pt  will be independent with HEP in order to improve strength and balance in order to decrease fall risk and improve function at home and work.    Time  8    Period  Weeks    Status  New    Target Date  07/11/19      PT LONG TERM GOAL #2   Title  Pt will improve DGI by at least 6 points in order to demonstrate significant improvement in balance and decreased risk for falls.    Baseline  6/24    Time  8    Period  Weeks    Status  New    Target Date  07/11/19      PT LONG TERM GOAL #3   Title  Pt will improve BERG by at least 3 points in order to demonstrate clinically significant improvement in balance.    Baseline  34/56    Time  8    Period  Weeks    Status  New    Target Date  07/11/19      PT LONG TERM GOAL #4   Title  Pt will decrease 5TSTS by at least 3 seconds and no UE support in order to demonstrate clinically significant improvement in LE strength.    Baseline  17 seconds, bilateral hand support    Time  8    Period  Weeks    Status  New    Target Date  07/11/19      PT LONG TERM GOAL #5   Title  Pt will improve 10 MWT to at least 1 m/s to indicate improved community ambulation and safety.    Baseline  .43 ms/ (quick pace)             Plan - 05/16/19 1428    Clinical Impression Statement  The patient is an 86 yo female referred to physical therapy for strength and balance training. Pt has been falling at home, and does also complain of dizziness. Independent for household ambulation, utilizes [facility]. Upon assessment the patient demonstrated deficits in gait, balance, endurance and LE strength, as well as low scores on outcome measures. These deficits limit the patient's ability to perform functional activities (such as walking, driving, transfers, reaching overhead, standing, lifting, etc) and increase patient's risk of falling. The patient would benefit from further skilled PT intervention to maximize functional abilities and address safety concerns.    Personal  Factors and Comorbidities  Age;Comorbidity 3+    Comorbidities  GERD, previous falls, HTN, OA, RA    Examination-Activity Limitations  Squat;Stairs;Lift;Bed Mobility;Stand;Reach Overhead;Dressing;Carry;Caring for Others;Transfers    Examination-Participation Restrictions  Laundry;Shop;Cleaning;Medication Management;Community Activity;Driving;Yard Work;Meal Prep    Stability/Clinical Decision Making  Stable/Uncomplicated    Clinical Decision Making  Low    Rehab Potential  Good    PT Frequency  2x / week    PT Duration  8 weeks    PT Treatment/Interventions  ADLs/Self Care Home Management;Canalith Repostioning;Cryotherapy;Ultrasound;Traction;Moist Heat;Electrical Stimulation;DME Instruction;Gait training;Stair training;Balance training;Therapeutic exercise;Therapeutic activities;Functional mobility training;Neuromuscular re-education;Patient/family education;Passive range of motion;Energy conservation;Vestibular;Taping;Splinting;Spinal Manipulations;Joint Manipulations    PT Next Visit Plan  address balance/strength/ambulation    PT [place] and  Agree with Plan of Care  Patient       Patient will benefit from skilled therapeutic intervention in order to improve the following deficits and impairments:  Abnormal gait, Decreased balance, Decreased endurance, Decreased mobility, Difficulty walking, Decreased activity tolerance, Decreased strength, Postural dysfunction  Visit Diagnosis: Muscle weakness (generalized)  Other abnormalities of gait and mobility     Problem List Patient Active Problem List   Diagnosis Date Noted  . Lump of left thigh 05/07/2019  . Poor balance 05/07/2019  . Vestibular dizziness 03/31/2018  . Hip fracture, right ([place]) 02/27/2018  . Pedal edema 09/28/2017  . Fall at home 09/28/2017  . Prediabetes 11/30/2016  . Toenail fungus 10/03/2015  . At moderate risk for fall 09/05/2015  . Cervical spondylosis without myelopathy  08/05/2015  . Routine general medical examination at a health care facility 08/27/2014  . Colon cancer screening 08/27/2014  . Fall against object 08/15/2014  . Encounter for Medicare annual wellness exam 07/25/2013  . Varicosities of leg 05/23/2012  . Abnormal ultrasound of thyroid gland 03/01/2011  . Hyperlipidemia 06/11/2008  . Allergic rhinitis 03/29/2008  . Essential hypertension, benign 12/26/2006  . GERD 12/26/2006  . Rheumatoid arthritis ([place]) 12/26/2006  . Osteoporosis 12/26/2006  . URINARY INCONTINENCE 12/26/2006    Lieutenant Diego PT, DPT 3:50 PM,05/16/19   [place] MAIN [facility] SERVICES [address] [place], [place], [address] Phone: [phone]   Fax:  [phone]  Name: Sierra Bentley MRN: [id number] Date of Birth: Aug 09, 1933

## 2019-05-21 DIAGNOSIS — H18513 Endothelial corneal dystrophy, bilateral: Secondary | ICD-10-CM | POA: Diagnosis not present

## 2019-05-21 DIAGNOSIS — H353211 Exudative age-related macular degeneration, right eye, with active choroidal neovascularization: Secondary | ICD-10-CM | POA: Diagnosis not present

## 2019-05-21 DIAGNOSIS — H40003 Preglaucoma, unspecified, bilateral: Secondary | ICD-10-CM | POA: Diagnosis not present

## 2019-05-21 DIAGNOSIS — H353122 Nonexudative age-related macular degeneration, left eye, intermediate dry stage: Secondary | ICD-10-CM | POA: Diagnosis not present

## 2019-05-22 ENCOUNTER — Encounter: Payer: Self-pay | Admitting: Physical Therapy

## 2019-05-22 ENCOUNTER — Ambulatory Visit: Payer: Medicare PPO | Admitting: Physical Therapy

## 2019-05-22 ENCOUNTER — Other Ambulatory Visit: Payer: Self-pay

## 2019-05-22 DIAGNOSIS — R2689 Other abnormalities of gait and mobility: Secondary | ICD-10-CM | POA: Diagnosis not present

## 2019-05-22 DIAGNOSIS — M6281 Muscle weakness (generalized): Secondary | ICD-10-CM

## 2019-05-22 NOTE — Therapy (Signed)
[place] MAIN [facility] SERVICES [address] [place], [place], [address] Phone: [phone]   Fax:  [phone]  Physical Therapy Treatment  Patient Details  Name: Sierra Bentley MRN: [id number] Date of Birth: [birth year] No data recorded  Encounter Date: 05/22/2019  PT End of Session - 05/22/19 1410    Visit Number  2    Number of Visits  16    Date for PT Re-Evaluation  07/11/19    Authorization Type  Medicare, eval [id number], cert [id number]    PT Start Time  0200    PT Stop Time  0240    PT Time Calculation (min)  40 min    Equipment Utilized During Treatment  Gait belt    Activity Tolerance  Patient tolerated treatment well    Behavior During Therapy  WFL for tasks assessed/performed       Past Medical History:  Diagnosis Date  . Allergic rhinitis, cause unspecified   . Cramp of limb   . Diverticulosis   . Epistaxis   . GERD (gastroesophageal reflux disease)   . HTN (hypertension)   . Osteopenia   . Other and unspecified hyperlipidemia   . Other malaise and fatigue   . Rheumatoid arthritis(714.0)   . Urine incontinence     Past Surgical History:  Procedure Laterality Date  . BREAST BIOPSY  2/11   fibrocystic change  . BREAST BIOPSY  8/11   fibrocystic change  . COLONOSCOPY  2003  . CYSTOSCOPY  2006  . MOLE REMOVAL     rectal  . TONSILLECTOMY      There were no vitals filed for this visit.  Subjective Assessment - 05/22/19 1409    Subjective  Patient reported that she fell 02/26/2018 and experienced a pelvic fx, and potentially a femur fx. Stated she went to rehab after that. Has had concerns about her balance since before that fall. Has had several falls in the last 6 months    Pertinent History  Patient is 86 yo female that  was referred to PT for strength and balance. PMH of fall abut a year ago and pelvic fx and R hip fx, GERD, HTN, OA, osteopenia, RA. Pt is husbands caregiver, performs all household tasks,  independent (will walk around house without [facility]). Pt does have a history of visual issues. Patient reported that she falls, especially with head turns, tends to fall backwards. Pt does complain of dizziness.    Limitations  Standing;Walking;House hold activities    How long can you sit comfortably?  NA    How long can you walk comfortably?  93mins (guesstimate)    Diagnostic tests  NA    Patient Stated Goals  not fall, walk better       Treatment: Nu-step x 5 mins   Neuromuscular Re-education  Fwd/bwd stepping over 1/2 foam x 20 side stepping over 1/2 foam x 20 Tandem stand on level surface without UE support x 2 lengths Side stepping on level surface without UE support x 2 lengths Heel/toe raises without UE support 3s hold x 10 each foam  balance 30s x 2 reps Lateral side steps from foam to 6 inch stool left and right x 15 Backwards stepping from foam to 6 inch stool x 15  [place] fwd/bwd, side to side , diagonal x 10 ,cues for posture and stepping strategies, occasional LOB       Pt educated throughout session about proper posture and technique with exercises. Improved  exercise technique, movement at target joints, use of target muscles after min to mod verbal, visual, tactile cues. CGA and Min to mod verbal cues used throughout with increased in postural sway and LOB most seen with narrow base of support and while on uneven surfaces.                         PT Education - 05/22/19 1409    Education Details  HEP    Person(s) Educated  Patient    Methods  Explanation    Comprehension  Verbalized understanding;Need further instruction       PT Short Term Goals - 05/16/19 1429      PT SHORT TERM GOAL #1   Title  Pt will be independent with initial HEP in order to improve strength and balance in order to decrease fall risk and improve function at home and work.    Baseline  administered on eval 4/21    Time  4    Period  Weeks    Status  New     Target Date  06/13/19        PT Long Term Goals - 05/16/19 1429      PT LONG TERM GOAL #1   Title  Pt will be independent with HEP in order to improve strength and balance in order to decrease fall risk and improve function at home and work.    Time  8    Period  Weeks    Status  New    Target Date  07/11/19      PT LONG TERM GOAL #2   Title  Pt will improve DGI by at least 6 points in order to demonstrate significant improvement in balance and decreased risk for falls.    Baseline  6/24    Time  8    Period  Weeks    Status  New    Target Date  07/11/19      PT LONG TERM GOAL #3   Title  Pt will improve BERG by at least 3 points in order to demonstrate clinically significant improvement in balance.    Baseline  34/56    Time  8    Period  Weeks    Status  New    Target Date  07/11/19      PT LONG TERM GOAL #4   Title  Pt will decrease 5TSTS by at least 3 seconds and no UE support in order to demonstrate clinically significant improvement in LE strength.    Baseline  17 seconds, bilateral hand support    Time  8    Period  Weeks    Status  New    Target Date  07/11/19      PT LONG TERM GOAL #5   Title  Pt will improve 10 MWT to at least 1 m/s to indicate improved community ambulation and safety.    Baseline  .43 ms/ (quick pace)            Plan - 05/22/19 1410    Clinical Impression Statement  Patient instructed in beginning balance and coordination exercise. Patient required mod VCs and min A for gait to improve weight shift and postural control. Patient requires min VCs to improve ankle stability with gait.  Patients would benefit from additional skilled PT intervention to improve balance/gait safety and reduce fall risk.   Personal Factors and Comorbidities  Age;Comorbidity 3+    Comorbidities  GERD, previous falls, HTN, OA, RA    Examination-Activity Limitations  Squat;Stairs;Lift;Bed Mobility;Stand;Reach Overhead;Dressing;Carry;Caring for Others;Transfers     Examination-Participation Restrictions  Laundry;Shop;Cleaning;Medication Management;Community Activity;Driving;Yard Work;Meal Prep    Stability/Clinical Decision Making  Stable/Uncomplicated    Rehab Potential  Good    PT Frequency  2x / week    PT Duration  8 weeks    PT Treatment/Interventions  ADLs/Self Care Home Management;Canalith Repostioning;Cryotherapy;Ultrasound;Traction;Moist Heat;Electrical Stimulation;DME Instruction;Gait training;Stair training;Balance training;Therapeutic exercise;Therapeutic activities;Functional mobility training;Neuromuscular re-education;Patient/family education;Passive range of motion;Energy conservation;Vestibular;Taping;Splinting;Spinal Manipulations;Joint Manipulations    PT Next Visit Plan  address balance/strength/ambulation    PT Home Exercise Plan  [email or web address]    Consulted and Agree with Plan of Care  Patient       Patient will benefit from skilled therapeutic intervention in order to improve the following deficits and impairments:  Abnormal gait, Decreased balance, Decreased endurance, Decreased mobility, Difficulty walking, Decreased activity tolerance, Decreased strength, Postural dysfunction  Visit Diagnosis: Muscle weakness (generalized)  Other abnormalities of gait and mobility     Problem List Patient Active Problem List   Diagnosis Date Noted  . Lump of left thigh 05/07/2019  . Poor balance 05/07/2019  . Vestibular dizziness 03/31/2018  . Hip fracture, right ([place]) 02/27/2018  . Pedal edema 09/28/2017  . Fall at home 09/28/2017  . Prediabetes 11/30/2016  . Toenail fungus 10/03/2015  . At moderate risk for fall 09/05/2015  . Cervical spondylosis without myelopathy 08/05/2015  . Routine general medical examination at a health care facility 08/27/2014  . Colon cancer screening 08/27/2014  . Fall against object 08/15/2014  . Encounter for Medicare annual wellness exam 07/25/2013  . Varicosities of leg 05/23/2012  . Abnormal ultrasound  of thyroid gland 03/01/2011  . Hyperlipidemia 06/11/2008  . Allergic rhinitis 03/29/2008  . Essential hypertension, benign 12/26/2006  . GERD 12/26/2006  . Rheumatoid arthritis ([place]) 12/26/2006  . Osteoporosis 12/26/2006  . URINARY INCONTINENCE 12/26/2006    Arelia Sneddon S,PT DPT 05/22/2019, 2:11 PM  [place] MAIN [facility] SERVICES [address] [place], [place], [address] Phone: [phone]   Fax:  [phone]  Name: Sierra Bentley MRN: [id number] Date of Birth: 1933-09-01

## 2019-05-28 DIAGNOSIS — M79674 Pain in right toe(s): Secondary | ICD-10-CM | POA: Diagnosis not present

## 2019-05-28 DIAGNOSIS — B351 Tinea unguium: Secondary | ICD-10-CM | POA: Diagnosis not present

## 2019-05-28 DIAGNOSIS — L600 Ingrowing nail: Secondary | ICD-10-CM | POA: Diagnosis not present

## 2019-05-28 DIAGNOSIS — M79675 Pain in left toe(s): Secondary | ICD-10-CM | POA: Diagnosis not present

## 2019-05-29 ENCOUNTER — Ambulatory Visit: Payer: Medicare PPO | Attending: Family Medicine | Admitting: Physical Therapy

## 2019-05-29 ENCOUNTER — Encounter: Payer: Self-pay | Admitting: Physical Therapy

## 2019-05-29 ENCOUNTER — Other Ambulatory Visit: Payer: Self-pay

## 2019-05-29 DIAGNOSIS — R2689 Other abnormalities of gait and mobility: Secondary | ICD-10-CM

## 2019-05-29 DIAGNOSIS — M6281 Muscle weakness (generalized): Secondary | ICD-10-CM | POA: Insufficient documentation

## 2019-05-29 NOTE — Therapy (Signed)
[place] MAIN [facility] SERVICES [address] [place], [place], [address] Phone: [phone]   Fax:  [phone]  Physical Therapy Treatment  Patient Details  Name: Sierra Bentley MRN: [id number] Date of Birth: [birth year] No data recorded  Encounter Date: 05/29/2019  PT End of Session - 05/29/19 1355    Visit Number  3    Number of Visits  16    Date for PT Re-Evaluation  07/11/19    Authorization Type  Medicare, eval [id number], cert [id number]    PT Start Time  1347    PT Stop Time  1430    PT Time Calculation (min)  43 min    Equipment Utilized During Treatment  Gait belt    Activity Tolerance  Patient tolerated treatment well    Behavior During Therapy  WFL for tasks assessed/performed       Past Medical History:  Diagnosis Date  . Allergic rhinitis, cause unspecified   . Cramp of limb   . Diverticulosis   . Epistaxis   . GERD (gastroesophageal reflux disease)   . HTN (hypertension)   . Osteopenia   . Other and unspecified hyperlipidemia   . Other malaise and fatigue   . Rheumatoid arthritis(714.0)   . Urine incontinence     Past Surgical History:  Procedure Laterality Date  . BREAST BIOPSY  2/11   fibrocystic change  . BREAST BIOPSY  8/11   fibrocystic change  . COLONOSCOPY  2003  . CYSTOSCOPY  2006  . MOLE REMOVAL     rectal  . TONSILLECTOMY      There were no vitals filed for this visit.  Subjective Assessment - 05/29/19 1354    Subjective  Patient reports doing well; She denies any soreness. She reports working on [employer] but states that she hasn't done them every day. She denies any new falls;    Pertinent History  Patient is 86 yo female that  was referred to PT for strength and balance. PMH of fall abut a year ago and pelvic fx and R hip fx, GERD, HTN, OA, osteopenia, RA. Pt is husbands caregiver, performs all household tasks, independent (will walk around house without [facility]). Pt does have a history of visual issues.  Patient reported that she falls, especially with head turns, tends to fall backwards. Pt does complain of dizziness.    Limitations  Standing;Walking;House hold activities    How long can you sit comfortably?  NA    How long can you walk comfortably?  25mins (guesstimate)    Diagnostic tests  NA    Patient Stated Goals  not fall, walk better    Currently in Pain?  No/denies    Multiple Pain Sites  No            Treatment: Nu-step BUE/BLE level 2 x4 min with cues to increase steps per minute >50; able to achieve 72 spm with increased shortness of breath reported;    Standing in parallel bars:  Fwd/bwd stepping over orange hurdle x 20 side stepping over orange hurdle x 20 Required close supervision and min Vcs to increase hip flexion to improve foot clearance over hurdle;   Standing on airex foam:  -Heel/toe raises without UE support 3s hold x 10 each -feet apart  Side/side weight shift x10 reps unsupported  Head turns side/side x10 reps unsupported  Arms across chest, head turns with trunk rotation x10 reps each direction -alternate toe taps airex to 4 inch step  with 2-1 rail assist x15 reps each LE -standing one foot on airex, one foot on 4 inch step   Unsupported standing 10 sec hold  Head turns side/side x5 reps each direction;  -tandem stance   Unsupported 10 sec hold  Unsupported head turns side/side x5 reps each, up/down x5 reps each;  Standing on 1/2 bolster: -heel/toe rock x10 reps with B rail assist -unsupported standing with cues to balance in neutral  Alternate UE lift x5 reps each UE   Pt educated throughout session about proper posture and technique with exercises. Improved exercise technique, movement at target joints, use of target muscles after min to mod verbal, visual, tactile cues.   Response to treatment: patient tolerated well. She does fatigue requiring short seated rest breaks. Patient denies any pain with advanced exercise. She does require min A for  safety with most balance exercise especially with less rail assist;                     PT Education - 05/29/19 1355    Education Details  balance/HEP reinforced;    Person(s) Educated  Patient    Methods  Explanation;Verbal cues    Comprehension  Verbalized understanding;Returned demonstration;Verbal cues required;Need further instruction       PT Short Term Goals - 05/16/19 1429      PT SHORT TERM GOAL #1   Title  Pt will be independent with initial HEP in order to improve strength and balance in order to decrease fall risk and improve function at home and work.    Baseline  administered on eval 4/21    Time  4    Period  Weeks    Status  New    Target Date  06/13/19        PT Long Term Goals - 05/16/19 1429      PT LONG TERM GOAL #1   Title  Pt will be independent with HEP in order to improve strength and balance in order to decrease fall risk and improve function at home and work.    Time  8    Period  Weeks    Status  New    Target Date  07/11/19      PT LONG TERM GOAL #2   Title  Pt will improve DGI by at least 6 points in order to demonstrate significant improvement in balance and decreased risk for falls.    Baseline  6/24    Time  8    Period  Weeks    Status  New    Target Date  07/11/19      PT LONG TERM GOAL #3   Title  Pt will improve BERG by at least 3 points in order to demonstrate clinically significant improvement in balance.    Baseline  34/56    Time  8    Period  Weeks    Status  New    Target Date  07/11/19      PT LONG TERM GOAL #4   Title  Pt will decrease 5TSTS by at least 3 seconds and no UE support in order to demonstrate clinically significant improvement in LE strength.    Baseline  17 seconds, bilateral hand support    Time  8    Period  Weeks    Status  New    Target Date  07/11/19      PT LONG TERM GOAL #5   Title  Pt will  improve 10 MWT to at least 1 m/s to indicate improved community ambulation and safety.     Baseline  .43 ms/ (quick pace)            Plan - 05/29/19 1411    Clinical Impression Statement  Patient motivated and participated well within session; She was instructed in advanced balance exercise. She does require CGA to min A for safety especially with reduced rail assist. Patient does have increased instability with lateral head turns. She would benefit from additional skilled PT intervention to improve strength, balance and mobility;    Personal Factors and Comorbidities  Age;Comorbidity 3+    Comorbidities  GERD, previous falls, HTN, OA, RA    Examination-Activity Limitations  Squat;Stairs;Lift;Bed Mobility;Stand;Reach Overhead;Dressing;Carry;Caring for Others;Transfers    Examination-Participation Restrictions  Laundry;Shop;Cleaning;Medication Management;Community Activity;Driving;Yard Work;Meal Prep    Stability/Clinical Decision Making  Stable/Uncomplicated    Rehab Potential  Good    PT Frequency  2x / week    PT Duration  8 weeks    PT Treatment/Interventions  ADLs/Self Care Home Management;Canalith Repostioning;Cryotherapy;Ultrasound;Traction;Moist Heat;Electrical Stimulation;DME Instruction;Gait training;Stair training;Balance training;Therapeutic exercise;Therapeutic activities;Functional mobility training;Neuromuscular re-education;Patient/family education;Passive range of motion;Energy conservation;Vestibular;Taping;Splinting;Spinal Manipulations;Joint Manipulations    PT Next Visit Plan  address balance/strength/ambulation    PT Home Exercise Plan  [email or web address]    Consulted and Agree with Plan of Care  Patient       Patient will benefit from skilled therapeutic intervention in order to improve the following deficits and impairments:  Abnormal gait, Decreased balance, Decreased endurance, Decreased mobility, Difficulty walking, Decreased activity tolerance, Decreased strength, Postural dysfunction  Visit Diagnosis: Muscle weakness (generalized)  Other abnormalities of  gait and mobility     Problem List Patient Active Problem List   Diagnosis Date Noted  . Lump of left thigh 05/07/2019  . Poor balance 05/07/2019  . Vestibular dizziness 03/31/2018  . Hip fracture, right ([place]) 02/27/2018  . Pedal edema 09/28/2017  . Fall at home 09/28/2017  . Prediabetes 11/30/2016  . Toenail fungus 10/03/2015  . At moderate risk for fall 09/05/2015  . Cervical spondylosis without myelopathy 08/05/2015  . Routine general medical examination at a health care facility 08/27/2014  . Colon cancer screening 08/27/2014  . Fall against object 08/15/2014  . Encounter for Medicare annual wellness exam 07/25/2013  . Varicosities of leg 05/23/2012  . Abnormal ultrasound of thyroid gland 03/01/2011  . Hyperlipidemia 06/11/2008  . Allergic rhinitis 03/29/2008  . Essential hypertension, benign 12/26/2006  . GERD 12/26/2006  . Rheumatoid arthritis ([place]) 12/26/2006  . Osteoporosis 12/26/2006  . URINARY INCONTINENCE 12/26/2006    Danniela Mcbrearty PT, DPT 05/29/2019, 2:13 PM  [place] MAIN [facility] SERVICES [address] [place], [place], [address] Phone: [phone]   Fax:  [phone]  Name: ALISSHA SHIRES MRN: [id number] Date of Birth: 1933/08/26

## 2019-05-31 ENCOUNTER — Ambulatory Visit: Payer: Medicare PPO | Admitting: Physical Therapy

## 2019-05-31 ENCOUNTER — Encounter: Payer: Self-pay | Admitting: Physical Therapy

## 2019-05-31 ENCOUNTER — Other Ambulatory Visit: Payer: Self-pay

## 2019-05-31 DIAGNOSIS — R2689 Other abnormalities of gait and mobility: Secondary | ICD-10-CM

## 2019-05-31 DIAGNOSIS — M6281 Muscle weakness (generalized): Secondary | ICD-10-CM | POA: Diagnosis not present

## 2019-05-31 NOTE — Therapy (Addendum)
`[place] MAIN [facility] SERVICES [address] [place], [place], [address] Phone: [phone]   Fax:  [phone]  Physical Therapy Treatment  Patient Details  Name: Sierra Bentley MRN: [id number] Date of Birth: 09/22/1933 No data recorded  Encounter Date: 05/31/2019  PT End of Session - 05/31/19 1610    Visit Number  4    Number of Visits  16    Date for PT Re-Evaluation  07/11/19    Authorization Type  Medicare, eval 0000000, cert [id number]    PT Start Time  1600    PT Stop Time  1640    PT Time Calculation (min)  40 min    Equipment Utilized During Treatment  Gait belt    Activity Tolerance  Patient tolerated treatment well    Behavior During Therapy  WFL for tasks assessed/performed       Past Medical History:  Diagnosis Date  . Allergic rhinitis, cause unspecified   . Cramp of limb   . Diverticulosis   . Epistaxis   . GERD (gastroesophageal reflux disease)   . HTN (hypertension)   . Osteopenia   . Other and unspecified hyperlipidemia   . Other malaise and fatigue   . Rheumatoid arthritis(714.0)   . Urine incontinence     Past Surgical History:  Procedure Laterality Date  . BREAST BIOPSY  2/11   fibrocystic change  . BREAST BIOPSY  8/11   fibrocystic change  . COLONOSCOPY  2003  . CYSTOSCOPY  2006  . MOLE REMOVAL     rectal  . TONSILLECTOMY      There were no vitals filed for this visit.  Subjective Assessment - 05/31/19 1610    Subjective  Patient reports doing well; She denies any soreness. She reports working on [employer] but states that she hasn't done them every day. She denies any new falls;    Pertinent History  Patient is 86 yo female that  was referred to PT for strength and balance. PMH of fall abut a year ago and pelvic fx and R hip fx, GERD, HTN, OA, osteopenia, RA. Pt is husbands caregiver, performs all household tasks, independent (will walk around house without [facility]). Pt does have a history of visual issues.  Patient reported that she falls, especially with head turns, tends to fall backwards. Pt does complain of dizziness.    Limitations  Standing;Walking;House hold activities    How long can you sit comfortably?  NA    How long can you walk comfortably?  61mins (guesstimate)    Diagnostic tests  NA    Patient Stated Goals  not fall, walk better    Currently in Pain?  No/denies    Multiple Pain Sites  No       Neuromuscular Re-education  Rocker board fwd/bwd, side to side x 20 each direction Tandem gait on level surface with minimal  UE support x 2 lengths Side stepping on level surface" with minimal  UE support x 2 lengths Heel/toe raises without UE support 3s hold x 10 each 1/2 foam roll balance with flat side up 30s x 2 reps 1/2 foam roll balance with flat side down 30s x 2 reps 1/2 foam roll tandem balance alternating forward LE 30s x 2 each LE forward Lateral side steps from foam to 6 inch stool left and right x 15 Leg press 25 lbs x 20 x 2, heel raises x 20 x 2       Pt educated  throughout session about proper posture and technique with exercises. Improved exercise technique, movement at target joints, use of target muscles after min to mod verbal, visual, tactile cues. CGA and Min to mod verbal cues used throughout with increased in postural sway and LOB most seen with narrow base of support and while on uneven surfaces.                        PT Education - 05/31/19 1610    Education Details  balance HEP    Person(s) Educated  Patient    Methods  Explanation    Comprehension  Verbalized understanding;Need further instruction       PT Short Term Goals - 05/16/19 1429      PT SHORT TERM GOAL #1   Title  Pt will be independent with initial HEP in order to improve strength and balance in order to decrease fall risk and improve function at home and work.    Baseline  administered on eval 4/21    Time  4    Period  Weeks    Status  New    Target Date   06/13/19        PT Long Term Goals - 05/16/19 1429      PT LONG TERM GOAL #1   Title  Pt will be independent with HEP in order to improve strength and balance in order to decrease fall risk and improve function at home and work.    Time  8    Period  Weeks    Status  New    Target Date  07/11/19      PT LONG TERM GOAL #2   Title  Pt will improve DGI by at least 6 points in order to demonstrate significant improvement in balance and decreased risk for falls.    Baseline  6/24    Time  8    Period  Weeks    Status  New    Target Date  07/11/19      PT LONG TERM GOAL #3   Title  Pt will improve BERG by at least 3 points in order to demonstrate clinically significant improvement in balance.    Baseline  34/56    Time  8    Period  Weeks    Status  New    Target Date  07/11/19      PT LONG TERM GOAL #4   Title  Pt will decrease 5TSTS by at least 3 seconds and no UE support in order to demonstrate clinically significant improvement in LE strength.    Baseline  17 seconds, bilateral hand support    Time  8    Period  Weeks    Status  New    Target Date  07/11/19      PT LONG TERM GOAL #5   Title  Pt will improve 10 MWT to at least 1 m/s to indicate improved community ambulation and safety.    Baseline  .43 ms/ (quick pace)            Plan - 05/31/19 1611    Clinical Impression Statement  Patient instructed in intermediate strengthening and balance exercise.  Patient requires min Vcs for correct exercise technique including to improve LE control with standing exercise. Patient demonstrates better ankle control with SLS tasks with rail assist. Patient would benefit from additional skilled PT intervention to improve balance/gait safety and reduce fall risk.  Personal Factors and Comorbidities  Age;Comorbidity 3+    Comorbidities  GERD, previous falls, HTN, OA, RA    Examination-Activity Limitations  Squat;Stairs;Lift;Bed Mobility;Stand;Reach Overhead;Dressing;Carry;Caring  for Others;Transfers    Examination-Participation Restrictions  Laundry;Shop;Cleaning;Medication Management;Community Activity;Driving;Yard Work;Meal Prep    Stability/Clinical Decision Making  Stable/Uncomplicated    Rehab Potential  Good    PT Frequency  2x / week    PT Duration  8 weeks    PT Treatment/Interventions  ADLs/Self Care Home Management;Canalith Repostioning;Cryotherapy;Ultrasound;Traction;Moist Heat;Electrical Stimulation;DME Instruction;Gait training;Stair training;Balance training;Therapeutic exercise;Therapeutic activities;Functional mobility training;Neuromuscular re-education;Patient/family education;Passive range of motion;Energy conservation;Vestibular;Taping;Splinting;Spinal Manipulations;Joint Manipulations    PT Next Visit Plan  address balance/strength/ambulation    PT Home Exercise Plan  [email or web address]    Consulted and Agree with Plan of Care  Patient       Patient will benefit from skilled therapeutic intervention in order to improve the following deficits and impairments:  Abnormal gait, Decreased balance, Decreased endurance, Decreased mobility, Difficulty walking, Decreased activity tolerance, Decreased strength, Postural dysfunction  Visit Diagnosis: Muscle weakness (generalized)  Other abnormalities of gait and mobility     Problem List Patient Active Problem List   Diagnosis Date Noted  . Lump of left thigh 05/07/2019  . Poor balance 05/07/2019  . Vestibular dizziness 03/31/2018  . Hip fracture, right ([place]) 02/27/2018  . Pedal edema 09/28/2017  . Fall at home 09/28/2017  . Prediabetes 11/30/2016  . Toenail fungus 10/03/2015  . At moderate risk for fall 09/05/2015  . Cervical spondylosis without myelopathy 08/05/2015  . Routine general medical examination at a health care facility 08/27/2014  . Colon cancer screening 08/27/2014  . Fall against object 08/15/2014  . Encounter for Medicare annual wellness exam 07/25/2013  . Varicosities of leg  05/23/2012  . Abnormal ultrasound of thyroid gland 03/01/2011  . Hyperlipidemia 06/11/2008  . Allergic rhinitis 03/29/2008  . Essential hypertension, benign 12/26/2006  . GERD 12/26/2006  . Rheumatoid arthritis ([place]) 12/26/2006  . Osteoporosis 12/26/2006  . URINARY INCONTINENCE 12/26/2006    Alanson Puls, [place] DPT 05/31/2019, 4:14 PM  [facility] MAIN [facility] SERVICES [address] [place], [place], [address] Phone: [phone]   Fax:  [phone]  Name: SHERETTA SURRETT MRN: [id number] Date of Birth: 08-29-1933

## 2019-06-05 ENCOUNTER — Ambulatory Visit: Payer: Medicare PPO | Admitting: Physical Therapy

## 2019-06-05 ENCOUNTER — Encounter: Payer: Self-pay | Admitting: Physical Therapy

## 2019-06-05 ENCOUNTER — Other Ambulatory Visit: Payer: Self-pay

## 2019-06-05 DIAGNOSIS — R2689 Other abnormalities of gait and mobility: Secondary | ICD-10-CM

## 2019-06-05 DIAGNOSIS — M6281 Muscle weakness (generalized): Secondary | ICD-10-CM

## 2019-06-05 NOTE — Therapy (Signed)
[place] MAIN [facility] SERVICES [address] [place], [place], [address] Phone: [phone]   Fax:  [phone]  Physical Therapy Treatment  Patient Details  Name: Sierra Bentley MRN: [id number] Date of Birth: Oct 25, 1933 No data recorded  Encounter Date: 06/05/2019  PT End of Session - 06/05/19 1519    Visit Number  5    Number of Visits  16    Date for PT Re-Evaluation  07/11/19    Authorization Type  Medicare, eval [id number], cert [id number]    PT Start Time  1515    PT Stop Time  1555    PT Time Calculation (min)  40 min    Equipment Utilized During Treatment  Gait belt    Activity Tolerance  Patient tolerated treatment well    Behavior During Therapy  WFL for tasks assessed/performed       Past Medical History:  Diagnosis Date  . Allergic rhinitis, cause unspecified   . Cramp of limb   . Diverticulosis   . Epistaxis   . GERD (gastroesophageal reflux disease)   . HTN (hypertension)   . Osteopenia   . Other and unspecified hyperlipidemia   . Other malaise and fatigue   . Rheumatoid arthritis(714.0)   . Urine incontinence     Past Surgical History:  Procedure Laterality Date  . BREAST BIOPSY  2/11   fibrocystic change  . BREAST BIOPSY  8/11   fibrocystic change  . COLONOSCOPY  2003  . CYSTOSCOPY  2006  . MOLE REMOVAL     rectal  . TONSILLECTOMY      There were no vitals filed for this visit.  Subjective Assessment - 06/05/19 1517    Subjective  Patient says that her granddaughter in law is in intensive care and had a baby at 30 weeks.    Pertinent History  Patient is 86 yo female that  was referred to PT for strength and balance. PMH of fall abut a year ago and pelvic fx and R hip fx, GERD, HTN, OA, osteopenia, RA. Pt is husbands caregiver, performs all household tasks, independent (will walk around house without [facility]). Pt does have a history of visual issues. Patient reported that she falls, especially with head turns,  tends to fall backwards. Pt does complain of dizziness.    Limitations  Standing;Walking;House hold activities    How long can you sit comfortably?  NA    How long can you walk comfortably?  30mins (guesstimate)    Diagnostic tests  NA    Patient Stated Goals  not fall, walk better       Therapeutic exercise: Nu-step x 5 mins L 4  Supine: SLR x 15 BLE Hookling marching x 15  Hooklying abd/ER x 15  Bridging x 15 SAQ x 15 BLE Hip abd/add x 15, BLE Heel slides x 15, BLE  Sidelying: Hip abd x 15 , BLE  Hip flexion marches with 2# ankle weights x 10 bilateral; Hip abduction with 2# x 10 bilateral Hip extension with 2# x 10 bilateral Step ups to 6-inch stool x 20   Patient needs occasional verbal cueing to improve posture and cueing to correctly perform exercises slowly, holding at end of range to increase motor firing of desired muscle to encourage fatigue.                     PT Education - 06/05/19 1518    Education Details  safety and balance  Person(s) Educated  Patient    Methods  Explanation    Comprehension  Verbalized understanding       PT Short Term Goals - 05/16/19 1429      PT SHORT TERM GOAL #1   Title  Pt will be independent with initial HEP in order to improve strength and balance in order to decrease fall risk and improve function at home and work.    Baseline  administered on eval 4/21    Time  4    Period  Weeks    Status  New    Target Date  06/13/19        PT Long Term Goals - 05/16/19 1429      PT LONG TERM GOAL #1   Title  Pt will be independent with HEP in order to improve strength and balance in order to decrease fall risk and improve function at home and work.    Time  8    Period  Weeks    Status  New    Target Date  07/11/19      PT LONG TERM GOAL #2   Title  Pt will improve DGI by at least 6 points in order to demonstrate significant improvement in balance and decreased risk for falls.    Baseline  6/24    Time   8    Period  Weeks    Status  New    Target Date  07/11/19      PT LONG TERM GOAL #3   Title  Pt will improve BERG by at least 3 points in order to demonstrate clinically significant improvement in balance.    Baseline  34/56    Time  8    Period  Weeks    Status  New    Target Date  07/11/19      PT LONG TERM GOAL #4   Title  Pt will decrease 5TSTS by at least 3 seconds and no UE support in order to demonstrate clinically significant improvement in LE strength.    Baseline  17 seconds, bilateral hand support    Time  8    Period  Weeks    Status  New    Target Date  07/11/19      PT LONG TERM GOAL #5   Title  Pt will improve 10 MWT to at least 1 m/s to indicate improved community ambulation and safety.    Baseline  .43 ms/ (quick pace)            Plan - 06/05/19 1519    Clinical Impression Statement   Pt was able to perform all exercises today with CGA.Marland Kitchen Pt was able to perform strength exercises, demonstrating improvements in LE strength and stability..  Pt requires verbal, visual and tactile cues during exercise in order to complete tasks with proper form and technique.   Pt would continue to benefit from skilled PT services in order to further strengthen LE's, improve static and dynamic balance, and improve coordination in order to increase functional mobility and decrease risk of falls   Personal Factors and Comorbidities  Age;Comorbidity 3+    Comorbidities  GERD, previous falls, HTN, OA, RA    Examination-Activity Limitations  Squat;Stairs;Lift;Bed Mobility;Stand;Reach Overhead;Dressing;Carry;Caring for Others;Transfers    Examination-Participation Restrictions  Laundry;Shop;Cleaning;Medication Management;Community Activity;Driving;Yard Work;Meal Prep    Stability/Clinical Decision Making  Stable/Uncomplicated    Rehab Potential  Good    PT Frequency  2x / week    PT  Duration  8 weeks    PT Treatment/Interventions  ADLs/Self Care Home Management;Canalith  Repostioning;Cryotherapy;Ultrasound;Traction;Moist Heat;Electrical Stimulation;DME Instruction;Gait training;Stair training;Balance training;Therapeutic exercise;Therapeutic activities;Functional mobility training;Neuromuscular re-education;Patient/family education;Passive range of motion;Energy conservation;Vestibular;Taping;Splinting;Spinal Manipulations;Joint Manipulations    PT Next Visit Plan  address balance/strength/ambulation    PT Home Exercise Plan  [email or web address]    Consulted and Agree with Plan of Care  Patient       Patient will benefit from skilled therapeutic intervention in order to improve the following deficits and impairments:  Abnormal gait, Decreased balance, Decreased endurance, Decreased mobility, Difficulty walking, Decreased activity tolerance, Decreased strength, Postural dysfunction  Visit Diagnosis: Muscle weakness (generalized)  Other abnormalities of gait and mobility     Problem List Patient Active Problem List   Diagnosis Date Noted  . Lump of left thigh 05/07/2019  . Poor balance 05/07/2019  . Vestibular dizziness 03/31/2018  . Hip fracture, right ([place]) 02/27/2018  . Pedal edema 09/28/2017  . Fall at home 09/28/2017  . Prediabetes 11/30/2016  . Toenail fungus 10/03/2015  . At moderate risk for fall 09/05/2015  . Cervical spondylosis without myelopathy 08/05/2015  . Routine general medical examination at a health care facility 08/27/2014  . Colon cancer screening 08/27/2014  . Fall against object 08/15/2014  . Encounter for Medicare annual wellness exam 07/25/2013  . Varicosities of leg 05/23/2012  . Abnormal ultrasound of thyroid gland 03/01/2011  . Hyperlipidemia 06/11/2008  . Allergic rhinitis 03/29/2008  . Essential hypertension, benign 12/26/2006  . GERD 12/26/2006  . Rheumatoid arthritis ([place]) 12/26/2006  . Osteoporosis 12/26/2006  . URINARY INCONTINENCE 12/26/2006    Alanson Puls, [place] DPT 06/05/2019, 3:20 PM  [place] MAIN [facility] SERVICES [address] [place], [place], [address] Phone: [phone]   Fax:  [phone]  Name: Sierra Bentley MRN: [id number] Date of Birth: Jun 24, 1933

## 2019-06-07 ENCOUNTER — Ambulatory Visit: Payer: Medicare PPO | Admitting: Physical Therapy

## 2019-06-07 ENCOUNTER — Encounter: Payer: Self-pay | Admitting: Physical Therapy

## 2019-06-07 ENCOUNTER — Other Ambulatory Visit: Payer: Self-pay

## 2019-06-07 DIAGNOSIS — R2689 Other abnormalities of gait and mobility: Secondary | ICD-10-CM

## 2019-06-07 DIAGNOSIS — M6281 Muscle weakness (generalized): Secondary | ICD-10-CM

## 2019-06-07 NOTE — Therapy (Signed)
[place] MAIN [facility] SERVICES [address] [place], [place], [address] Phone: [phone]   Fax:  [phone]  Physical Therapy Treatment  Patient Details  Name: Sierra Bentley MRN: [id number] Date of Birth: [birth year] No data recorded  Encounter Date: 06/07/2019  PT End of Session - 06/07/19 1433    Visit Number  6    Number of Visits  16    Date for PT Re-Evaluation  07/11/19    Authorization Type  Medicare, eval 0000000, cert [id number]    PT Start Time  1430    PT Stop Time  1510    PT Time Calculation (min)  40 min    Equipment Utilized During Treatment  Gait belt    Activity Tolerance  Patient tolerated treatment well    Behavior During Therapy  WFL for tasks assessed/performed       Past Medical History:  Diagnosis Date  . Allergic rhinitis, cause unspecified   . Cramp of limb   . Diverticulosis   . Epistaxis   . GERD (gastroesophageal reflux disease)   . HTN (hypertension)   . Osteopenia   . Other and unspecified hyperlipidemia   . Other malaise and fatigue   . Rheumatoid arthritis(714.0)   . Urine incontinence     Past Surgical History:  Procedure Laterality Date  . BREAST BIOPSY  2/11   fibrocystic change  . BREAST BIOPSY  8/11   fibrocystic change  . COLONOSCOPY  2003  . CYSTOSCOPY  2006  . MOLE REMOVAL     rectal  . TONSILLECTOMY      There were no vitals filed for this visit.  Subjective Assessment - 06/07/19 1432    Subjective  Patient is doing well. Her granddaugter had a baby and he is at [facility] .    Pertinent History  Patient is 86 yo female that  was referred to PT for strength and balance. PMH of fall abut a year ago and pelvic fx and R hip fx, GERD, HTN, OA, osteopenia, RA. Pt is husbands caregiver, performs all household tasks, independent (will walk around house without [facility]). Pt does have a history of visual issues. Patient reported that she falls, especially with head turns, tends to fall  backwards. Pt does complain of dizziness.    Limitations  Standing;Walking;House hold activities    How long can you sit comfortably?  NA    How long can you walk comfortably?  42mins (guesstimate)    Diagnostic tests  NA    Patient Stated Goals  not fall, walk better    Currently in Pain?  No/denies    Multiple Pain Sites  No       Treatment:  TM walking with UE assist 1.3 miles / hour x 5 mins  Leg press 40 lbs x 20 x 3   Neuromuscular Re-education  Tandem stand on 1/2 foam without UE support x 2 lengths Side stepping on floor without UE support x 2 lengths Rocker board fwd/bwd, side to side x 20  1/2 foam roll balance with flat side up 30s x 2 reps 1/2 foam roll balance with flat side down 30s x 2 reps 1/2 foam roll tandem balance alternating forward LE 30s x 2 each LE forward Lateral side steps from foam to 6 inch stool left and right x 15  Pt educated throughout session about proper posture and technique with exercises. Improved exercise technique, movement at target joints, use of target muscles after min  to mod verbal, visual, tactile cues. CGA and Min to mod verbal cues used throughout with increased in postural sway and LOB most seen with narrow base of support and while on uneven surfaces.                         PT Education - 06/07/19 1433    Education Details  safety and balance    Person(s) Educated  Patient    Methods  Explanation    Comprehension  Verbalized understanding;Need further instruction       PT Short Term Goals - 05/16/19 1429      PT SHORT TERM GOAL #1   Title  Pt will be independent with initial HEP in order to improve strength and balance in order to decrease fall risk and improve function at home and work.    Baseline  administered on eval 4/21    Time  4    Period  Weeks    Status  New    Target Date  06/13/19        PT Long Term Goals - 05/16/19 1429      PT LONG TERM GOAL #1   Title  Pt will be independent with  HEP in order to improve strength and balance in order to decrease fall risk and improve function at home and work.    Time  8    Period  Weeks    Status  New    Target Date  07/11/19      PT LONG TERM GOAL #2   Title  Pt will improve DGI by at least 6 points in order to demonstrate significant improvement in balance and decreased risk for falls.    Baseline  6/24    Time  8    Period  Weeks    Status  New    Target Date  07/11/19      PT LONG TERM GOAL #3   Title  Pt will improve BERG by at least 3 points in order to demonstrate clinically significant improvement in balance.    Baseline  34/56    Time  8    Period  Weeks    Status  New    Target Date  07/11/19      PT LONG TERM GOAL #4   Title  Pt will decrease 5TSTS by at least 3 seconds and no UE support in order to demonstrate clinically significant improvement in LE strength.    Baseline  17 seconds, bilateral hand support    Time  8    Period  Weeks    Status  New    Target Date  07/11/19      PT LONG TERM GOAL #5   Title  Pt will improve 10 MWT to at least 1 m/s to indicate improved community ambulation and safety.    Baseline  .43 ms/ (quick pace)            Plan - 06/07/19 1434    Clinical Impression Statement  Patient instructed in beginning balance and coordination exercise. Patient required mod VCs and min A for gait to improve weight shift and postural control. Patient requires min VCs to improve ankle stability with gait.  Patients would benefit from additional skilled PT intervention to improve balance/gait safety and reduce fall risk.   Personal Factors and Comorbidities  Age;Comorbidity 3+    Comorbidities  GERD, previous falls, HTN, OA, RA  Examination-Activity Limitations  Squat;Stairs;Lift;Bed Mobility;Stand;Reach Overhead;Dressing;Carry;Caring for Others;Transfers    Examination-Participation Restrictions  Laundry;Shop;Cleaning;Medication Management;Community Activity;Driving;Yard Work;Meal Prep     Stability/Clinical Decision Making  Stable/Uncomplicated    Rehab Potential  Good    PT Frequency  2x / week    PT Duration  8 weeks    PT Treatment/Interventions  ADLs/Self Care Home Management;Canalith Repostioning;Cryotherapy;Ultrasound;Traction;Moist Heat;Electrical Stimulation;DME Instruction;Gait training;Stair training;Balance training;Therapeutic exercise;Therapeutic activities;Functional mobility training;Neuromuscular re-education;Patient/family education;Passive range of motion;Energy conservation;Vestibular;Taping;Splinting;Spinal Manipulations;Joint Manipulations    PT Next Visit Plan  address balance/strength/ambulation    PT Home Exercise Plan  [email or web address]    Consulted and Agree with Plan of Care  Patient       Patient will benefit from skilled therapeutic intervention in order to improve the following deficits and impairments:  Abnormal gait, Decreased balance, Decreased endurance, Decreased mobility, Difficulty walking, Decreased activity tolerance, Decreased strength, Postural dysfunction  Visit Diagnosis: Muscle weakness (generalized)  Other abnormalities of gait and mobility     Problem List Patient Active Problem List   Diagnosis Date Noted  . Lump of left thigh 05/07/2019  . Poor balance 05/07/2019  . Vestibular dizziness 03/31/2018  . Hip fracture, right ([place]) 02/27/2018  . Pedal edema 09/28/2017  . Fall at home 09/28/2017  . Prediabetes 11/30/2016  . Toenail fungus 10/03/2015  . At moderate risk for fall 09/05/2015  . Cervical spondylosis without myelopathy 08/05/2015  . Routine general medical examination at a health care facility 08/27/2014  . Colon cancer screening 08/27/2014  . Fall against object 08/15/2014  . Encounter for Medicare annual wellness exam 07/25/2013  . Varicosities of leg 05/23/2012  . Abnormal ultrasound of thyroid gland 03/01/2011  . Hyperlipidemia 06/11/2008  . Allergic rhinitis 03/29/2008  . Essential hypertension, benign  12/26/2006  . GERD 12/26/2006  . Rheumatoid arthritis ([place]) 12/26/2006  . Osteoporosis 12/26/2006  . URINARY INCONTINENCE 12/26/2006    Alanson Puls, [place] DPT 06/07/2019, 2:34 PM  [place] MAIN [facility] SERVICES [address] [place], [place], [address] Phone: [phone]   Fax:  [phone]  Name: Sierra Bentley MRN: [id number] Date of Birth: 10-28-1933

## 2019-06-12 ENCOUNTER — Other Ambulatory Visit: Payer: Self-pay

## 2019-06-12 ENCOUNTER — Ambulatory Visit: Payer: Medicare PPO | Admitting: Physical Therapy

## 2019-06-12 DIAGNOSIS — M6281 Muscle weakness (generalized): Secondary | ICD-10-CM

## 2019-06-12 DIAGNOSIS — R2689 Other abnormalities of gait and mobility: Secondary | ICD-10-CM

## 2019-06-12 NOTE — Therapy (Signed)
[place] MAIN [facility] SERVICES [address] [place], [place], [address] Phone: [phone]   Fax:  [phone]  Physical Therapy Treatment  Patient Details  Name: Sierra Bentley MRN: [id number] Date of Birth: [birth year] No data recorded  Encounter Date: 06/12/2019  PT End of Session - 06/12/19 1556    Visit Number  7    Number of Visits  16    Date for PT Re-Evaluation  07/11/19    Authorization Type  Medicare, eval [id number], cert [id number]    PT Start Time  1430    PT Stop Time  1510    PT Time Calculation (min)  40 min    Equipment Utilized During Treatment  Gait belt    Activity Tolerance  Patient tolerated treatment well    Behavior During Therapy  WFL for tasks assessed/performed       Past Medical History:  Diagnosis Date  . Allergic rhinitis, cause unspecified   . Cramp of limb   . Diverticulosis   . Epistaxis   . GERD (gastroesophageal reflux disease)   . HTN (hypertension)   . Osteopenia   . Other and unspecified hyperlipidemia   . Other malaise and fatigue   . Rheumatoid arthritis(714.0)   . Urine incontinence     Past Surgical History:  Procedure Laterality Date  . BREAST BIOPSY  2/11   fibrocystic change  . BREAST BIOPSY  8/11   fibrocystic change  . COLONOSCOPY  2003  . CYSTOSCOPY  2006  . MOLE REMOVAL     rectal  . TONSILLECTOMY      There were no vitals filed for this visit.  Subjective Assessment - 06/12/19 1555    Subjective  Patient is doing well.    Pertinent History  Patient is 86 yo female that  was referred to PT for strength and balance. PMH of fall abut a year ago and pelvic fx and R hip fx, GERD, HTN, OA, osteopenia, RA. Pt is husbands caregiver, performs all household tasks, independent (will walk around house without [facility]). Pt does have a history of visual issues. Patient reported that she falls, especially with head turns, tends to fall backwards. Pt does complain of dizziness.    Limitations  Standing;Walking;House hold activities    How long can you sit comfortably?  NA    How long can you walk comfortably?  48mins (guesstimate)    Diagnostic tests  NA    Patient Stated Goals  not fall, walk better    Currently in Pain?  No/denies    Multiple Pain Sites  No       Treatment: TM fwd stepping with UE 1.0 miles / hour x 5 mins  Leg press x 20 x 3 , 35 lbs  : Neuromuscular Training: Stool: staggered stance, head turns side/side, up/down x 5 reps each, each foot in front; VCs for proper technique and positioning for each exercise staggered stance, trunk rotation with 2 lb rod, VC to keep UE straight and turn head with trunk  Hurdle: Forward and backward stepping over hurdle x15 each direction, VCs to take big enough steps and to try to increase speed to work on coordination  Side stepping over hurdle 15x each direction  Blue Foam: Side stepping x10 on blue balance CGA for safety, VCs for taking a big enough step  Airex pad trunk rotation x2 min, CGA for safety, demonstrated difficulty with keeping arms extended and full rotation with head turn Airex pad,  balloon tapping to mirror x2 min, supervision for safety with varying directions and speed of balloon, VCs for utilizing both hands and minimizing UE support        Pt educated throughout session about proper posture and technique with exercises. Improved exercise technique, movement at target joints, use of target muscles after min to mod verbal, visual, tactile cues. CGA and Min to mod verbal cues used throughout with increased in postural sway and LOB most seen with narrow base of support and while on uneven surfaces.                      PT Education - 06/12/19 1556    Education Details  safety with head turns    Person(s) Educated  Patient    Methods  Explanation;Demonstration    Comprehension  Verbalized understanding;Returned demonstration;Verbal cues required;Tactile cues  required;Need further instruction       PT Short Term Goals - 05/16/19 1429      PT SHORT TERM GOAL #1   Title  Pt will be independent with initial HEP in order to improve strength and balance in order to decrease fall risk and improve function at home and work.    Baseline  administered on eval 4/21    Time  4    Period  Weeks    Status  New    Target Date  06/13/19        PT Long Term Goals - 05/16/19 1429      PT LONG TERM GOAL #1   Title  Pt will be independent with HEP in order to improve strength and balance in order to decrease fall risk and improve function at home and work.    Time  8    Period  Weeks    Status  New    Target Date  07/11/19      PT LONG TERM GOAL #2   Title  Pt will improve DGI by at least 6 points in order to demonstrate significant improvement in balance and decreased risk for falls.    Baseline  6/24    Time  8    Period  Weeks    Status  New    Target Date  07/11/19      PT LONG TERM GOAL #3   Title  Pt will improve BERG by at least 3 points in order to demonstrate clinically significant improvement in balance.    Baseline  34/56    Time  8    Period  Weeks    Status  New    Target Date  07/11/19      PT LONG TERM GOAL #4   Title  Pt will decrease 5TSTS by at least 3 seconds and no UE support in order to demonstrate clinically significant improvement in LE strength.    Baseline  17 seconds, bilateral hand support    Time  8    Period  Weeks    Status  New    Target Date  07/11/19      PT LONG TERM GOAL #5   Title  Pt will improve 10 MWT to at least 1 m/s to indicate improved community ambulation and safety.    Baseline  .43 ms/ (quick pace)            Plan - 06/12/19 1557    Clinical Impression Statement  Patient instructed in intermediate strengthening and balance exercise.  Patient requires min Vcs for correct  exercise technique including to improve LE control with standing exercise. Patient demonstrates better quad control  with SLS tasks with rail assist. Patient would benefit from additional skilled PT intervention to improve balance/gait safety and reduce fall risk.   Personal Factors and Comorbidities  Age;Comorbidity 3+    Comorbidities  GERD, previous falls, HTN, OA, RA    Examination-Activity Limitations  Squat;Stairs;Lift;Bed Mobility;Stand;Reach Overhead;Dressing;Carry;Caring for Others;Transfers    Examination-Participation Restrictions  Laundry;Shop;Cleaning;Medication Management;Community Activity;Driving;Yard Work;Meal Prep    Stability/Clinical Decision Making  Stable/Uncomplicated    Rehab Potential  Good    PT Frequency  2x / week    PT Duration  8 weeks    PT Treatment/Interventions  ADLs/Self Care Home Management;Canalith Repostioning;Cryotherapy;Ultrasound;Traction;Moist Heat;Electrical Stimulation;DME Instruction;Gait training;Stair training;Balance training;Therapeutic exercise;Therapeutic activities;Functional mobility training;Neuromuscular re-education;Patient/family education;Passive range of motion;Energy conservation;Vestibular;Taping;Splinting;Spinal Manipulations;Joint Manipulations    PT Next Visit Plan  address balance/strength/ambulation    PT Home Exercise Plan  [email or web address]    Consulted and Agree with Plan of Care  Patient       Patient will benefit from skilled therapeutic intervention in order to improve the following deficits and impairments:  Abnormal gait, Decreased balance, Decreased endurance, Decreased mobility, Difficulty walking, Decreased activity tolerance, Decreased strength, Postural dysfunction  Visit Diagnosis: Muscle weakness (generalized)  Other abnormalities of gait and mobility     Problem List Patient Active Problem List   Diagnosis Date Noted  . Lump of left thigh 05/07/2019  . Poor balance 05/07/2019  . Vestibular dizziness 03/31/2018  . Hip fracture, right ([place]) 02/27/2018  . Pedal edema 09/28/2017  . Fall at home 09/28/2017  . Prediabetes  11/30/2016  . Toenail fungus 10/03/2015  . At moderate risk for fall 09/05/2015  . Cervical spondylosis without myelopathy 08/05/2015  . Routine general medical examination at a health care facility 08/27/2014  . Colon cancer screening 08/27/2014  . Fall against object 08/15/2014  . Encounter for Medicare annual wellness exam 07/25/2013  . Varicosities of leg 05/23/2012  . Abnormal ultrasound of thyroid gland 03/01/2011  . Hyperlipidemia 06/11/2008  . Allergic rhinitis 03/29/2008  . Essential hypertension, benign 12/26/2006  . GERD 12/26/2006  . Rheumatoid arthritis ([place]) 12/26/2006  . Osteoporosis 12/26/2006  . URINARY INCONTINENCE 12/26/2006    Alanson Puls, [place] DPT 06/12/2019, 3:59 PM  [place] MAIN [facility] SERVICES [address] [place], [place], [address] Phone: [phone]   Fax:  [phone]  Name: Sierra Bentley MRN: [id number] Date of Birth: 1933-06-21

## 2019-06-14 ENCOUNTER — Other Ambulatory Visit: Payer: Self-pay

## 2019-06-14 ENCOUNTER — Ambulatory Visit: Payer: Medicare PPO | Admitting: Physical Therapy

## 2019-06-14 ENCOUNTER — Encounter: Payer: Self-pay | Admitting: Physical Therapy

## 2019-06-14 DIAGNOSIS — R2689 Other abnormalities of gait and mobility: Secondary | ICD-10-CM

## 2019-06-14 DIAGNOSIS — M6281 Muscle weakness (generalized): Secondary | ICD-10-CM | POA: Diagnosis not present

## 2019-06-14 NOTE — Therapy (Signed)
[place] MAIN [facility] SERVICES [address] [place], [place], [address] Phone: [phone]   Fax:  [phone]  Physical Therapy Treatment  Patient Details  Name: Sierra Bentley MRN: [id number] Date of Birth: March 16, 1933 No data recorded  Encounter Date: 06/14/2019  PT End of Session - 06/14/19 1502    Visit Number  8    Number of Visits  16    Date for PT Re-Evaluation  07/11/19    Authorization Type  Medicare, eval 0000000, cert [id number]    PT Start Time  1430    PT Stop Time  1515    PT Time Calculation (min)  45 min    Equipment Utilized During Treatment  Gait belt    Activity Tolerance  Patient tolerated treatment well    Behavior During Therapy  WFL for tasks assessed/performed       Past Medical History:  Diagnosis Date  . Allergic rhinitis, cause unspecified   . Cramp of limb   . Diverticulosis   . Epistaxis   . GERD (gastroesophageal reflux disease)   . HTN (hypertension)   . Osteopenia   . Other and unspecified hyperlipidemia   . Other malaise and fatigue   . Rheumatoid arthritis(714.0)   . Urine incontinence     Past Surgical History:  Procedure Laterality Date  . BREAST BIOPSY  2/11   fibrocystic change  . BREAST BIOPSY  8/11   fibrocystic change  . COLONOSCOPY  2003  . CYSTOSCOPY  2006  . MOLE REMOVAL     rectal  . TONSILLECTOMY      There were no vitals filed for this visit.  Subjective Assessment - 06/14/19 1501    Subjective  Patient is doing well.    Pertinent History  Patient is 86 yo female that  was referred to PT for strength and balance. PMH of fall abut a year ago and pelvic fx and R hip fx, GERD, HTN, OA, osteopenia, RA. Pt is husbands caregiver, performs all household tasks, independent (will walk around house without [facility]). Pt does have a history of visual issues. Patient reported that she falls, especially with head turns, tends to fall backwards. Pt does complain of dizziness.     Limitations  Standing;Walking;House hold activities    How long can you sit comfortably?  NA    How long can you walk comfortably?  7mins (guesstimate)    Diagnostic tests  NA    Patient Stated Goals  not fall, walk better    Currently in Pain?  No/denies    Multiple Pain Sites  No       Ther-ex  TM side stepping left and right elevation 1 . 4 miles/ hour Octane fitness x 5 mins , L4 Hip flexion marches with 2# ankle weights x 10 bilateral Hip abduction with 2# x 10 bilateral Hip extension with 2# x 10 bilateral Quantum leg press 25# x 20 x 3 sets  Heel raises x 15 x 2 sets High marching x 20 BLE   Patient needs occasional verbal cueing to improve posture and cueing to correctly perform exercises slowly, holding at end of range to increase motor firing of desired muscle to encourage fatigue.                          PT Education - 06/14/19 1501    Education Details  safety with head turns and gait    Person(s) Educated  Patient    Methods  Explanation    Comprehension  Verbalized understanding       PT Short Term Goals - 05/16/19 1429      PT SHORT TERM GOAL #1   Title  Pt will be independent with initial HEP in order to improve strength and balance in order to decrease fall risk and improve function at home and work.    Baseline  administered on eval 4/21    Time  4    Period  Weeks    Status  New    Target Date  06/13/19        PT Long Term Goals - 05/16/19 1429      PT LONG TERM GOAL #1   Title  Pt will be independent with HEP in order to improve strength and balance in order to decrease fall risk and improve function at home and work.    Time  8    Period  Weeks    Status  New    Target Date  07/11/19      PT LONG TERM GOAL #2   Title  Pt will improve DGI by at least 6 points in order to demonstrate significant improvement in balance and decreased risk for falls.    Baseline  6/24    Time  8    Period  Weeks    Status  New     Target Date  07/11/19      PT LONG TERM GOAL #3   Title  Pt will improve BERG by at least 3 points in order to demonstrate clinically significant improvement in balance.    Baseline  34/56    Time  8    Period  Weeks    Status  New    Target Date  07/11/19      PT LONG TERM GOAL #4   Title  Pt will decrease 5TSTS by at least 3 seconds and no UE support in order to demonstrate clinically significant improvement in LE strength.    Baseline  17 seconds, bilateral hand support    Time  8    Period  Weeks    Status  New    Target Date  07/11/19      PT LONG TERM GOAL #5   Title  Pt will improve 10 MWT to at least 1 m/s to indicate improved community ambulation and safety.    Baseline  .43 ms/ (quick pace)            Plan - 06/14/19 1502    Clinical Impression Statement  Instructed patient in advanced LE strengthening; patient requires min VCs for correct exercise technique to improve strengthening;  Patient would benefit from additional skilled PT intervention to improve strength, balance/gait safety.   Personal Factors and Comorbidities  Age;Comorbidity 3+    Comorbidities  GERD, previous falls, HTN, OA, RA    Examination-Activity Limitations  Squat;Stairs;Lift;Bed Mobility;Stand;Reach Overhead;Dressing;Carry;Caring for Others;Transfers    Examination-Participation Restrictions  Laundry;Shop;Cleaning;Medication Management;Community Activity;Driving;Yard Work;Meal Prep    Stability/Clinical Decision Making  Stable/Uncomplicated    Rehab Potential  Good    PT Frequency  2x / week    PT Duration  8 weeks    PT Treatment/Interventions  ADLs/Self Care Home Management;Canalith Repostioning;Cryotherapy;Ultrasound;Traction;Moist Heat;Electrical Stimulation;DME Instruction;Gait training;Stair training;Balance training;Therapeutic exercise;Therapeutic activities;Functional mobility training;Neuromuscular re-education;Patient/family education;Passive range of motion;Energy  conservation;Vestibular;Taping;Splinting;Spinal Manipulations;Joint Manipulations    PT Next Visit Plan  address balance/strength/ambulation    PT [place]  Consulted and Agree with Plan of Care  Patient       Patient will benefit from skilled therapeutic intervention in order to improve the following deficits and impairments:  Abnormal gait, Decreased balance, Decreased endurance, Decreased mobility, Difficulty walking, Decreased activity tolerance, Decreased strength, Postural dysfunction  Visit Diagnosis: Other abnormalities of gait and mobility  Muscle weakness (generalized)     Problem List Patient Active Problem List   Diagnosis Date Noted  . Lump of left thigh 05/07/2019  . Poor balance 05/07/2019  . Vestibular dizziness 03/31/2018  . Hip fracture, right ([place]) 02/27/2018  . Pedal edema 09/28/2017  . Fall at home 09/28/2017  . Prediabetes 11/30/2016  . Toenail fungus 10/03/2015  . At moderate risk for fall 09/05/2015  . Cervical spondylosis without myelopathy 08/05/2015  . Routine general medical examination at a health care facility 08/27/2014  . Colon cancer screening 08/27/2014  . Fall against object 08/15/2014  . Encounter for Medicare annual wellness exam 07/25/2013  . Varicosities of leg 05/23/2012  . Abnormal ultrasound of thyroid gland 03/01/2011  . Hyperlipidemia 06/11/2008  . Allergic rhinitis 03/29/2008  . Essential hypertension, benign 12/26/2006  . GERD 12/26/2006  . Rheumatoid arthritis ([place]) 12/26/2006  . Osteoporosis 12/26/2006  . URINARY INCONTINENCE 12/26/2006    Arelia Sneddon S,PT DPT 06/14/2019, 3:03 PM  [place] MAIN [facility] SERVICES [address] [place], [place], [address] Phone: [phone]   Fax:  [phone]  Name: Sierra Bentley MRN: [id number] Date of Birth: 03/27/1933

## 2019-06-19 ENCOUNTER — Other Ambulatory Visit: Payer: Self-pay

## 2019-06-19 ENCOUNTER — Encounter: Payer: Self-pay | Admitting: Physical Therapy

## 2019-06-19 ENCOUNTER — Ambulatory Visit: Payer: Medicare PPO | Admitting: Physical Therapy

## 2019-06-19 DIAGNOSIS — M6281 Muscle weakness (generalized): Secondary | ICD-10-CM

## 2019-06-19 DIAGNOSIS — R2689 Other abnormalities of gait and mobility: Secondary | ICD-10-CM

## 2019-06-19 NOTE — Therapy (Signed)
[place] MAIN [facility] SERVICES [address] [place], [place], [address] Phone: [phone]   Fax:  [phone]  Physical Therapy Treatment  Patient Details  Name: Sierra Bentley MRN: [id number] Date of Birth: 09-20-1933 No data recorded  Encounter Date: 06/19/2019  PT End of Session - 06/19/19 1459    Visit Number  9    Number of Visits  16    Date for PT Re-Evaluation  07/11/19    Authorization Type  Medicare, eval 0000000, cert [id number]    PT Start Time  1430    PT Stop Time  1510    PT Time Calculation (min)  40 min    Equipment Utilized During Treatment  Gait belt    Activity Tolerance  Patient tolerated treatment well    Behavior During Therapy  WFL for tasks assessed/performed       Past Medical History:  Diagnosis Date  . Allergic rhinitis, cause unspecified   . Cramp of limb   . Diverticulosis   . Epistaxis   . GERD (gastroesophageal reflux disease)   . HTN (hypertension)   . Osteopenia   . Other and unspecified hyperlipidemia   . Other malaise and fatigue   . Rheumatoid arthritis(714.0)   . Urine incontinence     Past Surgical History:  Procedure Laterality Date  . BREAST BIOPSY  2/11   fibrocystic change  . BREAST BIOPSY  8/11   fibrocystic change  . COLONOSCOPY  2003  . CYSTOSCOPY  2006  . MOLE REMOVAL     rectal  . TONSILLECTOMY      There were no vitals filed for this visit.  Subjective Assessment - 06/19/19 1459    Subjective  Patient is doing well.    Pertinent History  Patient is 86 yo female that  was referred to PT for strength and balance. PMH of fall abut a year ago and pelvic fx and R hip fx, GERD, HTN, OA, osteopenia, RA. Pt is husbands caregiver, performs all household tasks, independent (will walk around house without [facility]). Pt does have a history of visual issues. Patient reported that she falls, especially with head turns, tends to fall backwards. Pt does complain of dizziness.     Limitations  Standing;Walking;House hold activities    How long can you sit comfortably?  NA    How long can you walk comfortably?  50mins (guesstimate)    Diagnostic tests  NA    Patient Stated Goals  not fall, walk better    Currently in Pain?  No/denies    Multiple Pain Sites  No      Treatment: Octane fitness x 5 mins    Neuromuscular Re-education  Rocker board fwd/bwd, side to side x 20 each direction Tandem gait on floor without UE support x 2 lengths Side stepping on TM without UE support x 2 lengths Heel/toe raises without UE support 3s hold x 10 each 1/2 foam roll balance with flat side up 30s x 2 reps 1/2 foam roll balance with flat side down 30s x 2 reps 1/2 foam roll tandem balance alternating forward LE 30s x 2 each LE forward Lateral side steps from foam to 6 inch stool left and right x 15 Backwards stepping from foam to 6 inch stool x 15   leg press 25 lbs x 20 x 3       Pt educated throughout session about proper posture and technique with exercises. Improved exercise technique, movement at target  joints, use of target muscles after min to mod verbal, visual, tactile cues. CGA and Min to mod verbal cues used throughout with increased in postural sway and LOB most seen with narrow base of support and while on uneven surfaces. Continues to have balance deficits typical with diagnosis. Patient performs intermediate level exercises without pain behaviors and needs verbal cuing for postural alignment and head positioning Tactile cues and assistance needed to keep lower leg and knee in neutral to avoid compensations with ankle motions.                         PT Education - 06/19/19 1459    Education Details  safety with turns and walking with head turns    Person(s) Educated  Patient    Methods  Explanation    Comprehension  Returned demonstration;Need further instruction       PT Short Term Goals - 05/16/19 1429      PT SHORT TERM GOAL #1    Title  Pt will be independent with initial HEP in order to improve strength and balance in order to decrease fall risk and improve function at home and work.    Baseline  administered on eval 4/21    Time  4    Period  Weeks    Status  New    Target Date  06/13/19        PT Long Term Goals - 05/16/19 1429      PT LONG TERM GOAL #1   Title  Pt will be independent with HEP in order to improve strength and balance in order to decrease fall risk and improve function at home and work.    Time  8    Period  Weeks    Status  New    Target Date  07/11/19      PT LONG TERM GOAL #2   Title  Pt will improve DGI by at least 6 points in order to demonstrate significant improvement in balance and decreased risk for falls.    Baseline  6/24    Time  8    Period  Weeks    Status  New    Target Date  07/11/19      PT LONG TERM GOAL #3   Title  Pt will improve BERG by at least 3 points in order to demonstrate clinically significant improvement in balance.    Baseline  34/56    Time  8    Period  Weeks    Status  New    Target Date  07/11/19      PT LONG TERM GOAL #4   Title  Pt will decrease 5TSTS by at least 3 seconds and no UE support in order to demonstrate clinically significant improvement in LE strength.    Baseline  17 seconds, bilateral hand support    Time  8    Period  Weeks    Status  New    Target Date  07/11/19      PT LONG TERM GOAL #5   Title  Pt will improve 10 MWT to at least 1 m/s to indicate improved community ambulation and safety.    Baseline  .43 ms/ (quick pace)            Plan - 06/19/19 1500    Clinical Impression Statement Patient demonstrating increased coordination of muscle activation with smoother movements during strengthening interventions in close chained. . Patient  continues to be challenged by dynamic surfaces and single limb stance. Patient will continue to benefit from skilled physical therapy to improve gait mechanics, strength, and balance  for return to PLOF   Personal Factors and Comorbidities  Age;Comorbidity 3+    Comorbidities  GERD, previous falls, HTN, OA, RA    Examination-Activity Limitations  Squat;Stairs;Lift;Bed Mobility;Stand;Reach Overhead;Dressing;Carry;Caring for Others;Transfers    Examination-Participation Restrictions  Laundry;Shop;Cleaning;Medication Management;Community Activity;Driving;Yard Work;Meal Prep    Stability/Clinical Decision Making  Stable/Uncomplicated    Rehab Potential  Good    PT Frequency  2x / week    PT Duration  8 weeks    PT Treatment/Interventions  ADLs/Self Care Home Management;Canalith Repostioning;Cryotherapy;Ultrasound;Traction;Moist Heat;Electrical Stimulation;DME Instruction;Gait training;Stair training;Balance training;Therapeutic exercise;Therapeutic activities;Functional mobility training;Neuromuscular re-education;Patient/family education;Passive range of motion;Energy conservation;Vestibular;Taping;Splinting;Spinal Manipulations;Joint Manipulations    PT Next Visit Plan  address balance/strength/ambulation    PT Home Exercise Plan  [email or web address]    Consulted and Agree with Plan of Care  Patient       Patient will benefit from skilled therapeutic intervention in order to improve the following deficits and impairments:  Abnormal gait, Decreased balance, Decreased endurance, Decreased mobility, Difficulty walking, Decreased activity tolerance, Decreased strength, Postural dysfunction  Visit Diagnosis: Muscle weakness (generalized)  Other abnormalities of gait and mobility     Problem List Patient Active Problem List   Diagnosis Date Noted  . Lump of left thigh 05/07/2019  . Poor balance 05/07/2019  . Vestibular dizziness 03/31/2018  . Hip fracture, right ([place]) 02/27/2018  . Pedal edema 09/28/2017  . Fall at home 09/28/2017  . Prediabetes 11/30/2016  . Toenail fungus 10/03/2015  . At moderate risk for fall 09/05/2015  . Cervical spondylosis without myelopathy 08/05/2015   . Routine general medical examination at a health care facility 08/27/2014  . Colon cancer screening 08/27/2014  . Fall against object 08/15/2014  . Encounter for Medicare annual wellness exam 07/25/2013  . Varicosities of leg 05/23/2012  . Abnormal ultrasound of thyroid gland 03/01/2011  . Hyperlipidemia 06/11/2008  . Allergic rhinitis 03/29/2008  . Essential hypertension, benign 12/26/2006  . GERD 12/26/2006  . Rheumatoid arthritis ([place]) 12/26/2006  . Osteoporosis 12/26/2006  . URINARY INCONTINENCE 12/26/2006    Alanson Puls, [place] DPT 06/19/2019, 3:01 PM  [place] MAIN [facility] SERVICES [address] [place], [place], [address] Phone: [phone]   Fax:  [phone]  Name: STEPHANNIE KOBAYASHI MRN: [id number] Date of Birth: 05/27/1933

## 2019-06-21 ENCOUNTER — Ambulatory Visit: Payer: Medicare PPO | Admitting: Physical Therapy

## 2019-06-21 ENCOUNTER — Other Ambulatory Visit: Payer: Self-pay

## 2019-06-21 ENCOUNTER — Encounter: Payer: Self-pay | Admitting: Physical Therapy

## 2019-06-21 DIAGNOSIS — M6281 Muscle weakness (generalized): Secondary | ICD-10-CM | POA: Diagnosis not present

## 2019-06-21 DIAGNOSIS — R2689 Other abnormalities of gait and mobility: Secondary | ICD-10-CM

## 2019-06-21 NOTE — Therapy (Signed)
[place] MAIN [facility] SERVICES [address] [place], [place], [address] Phone: [phone]   Fax:  [phone]  Physical Therapy Treatment Physical Therapy Progress Note   Dates of reporting period  05/16/19   to 06/21/19  Patient Details  Name: Sierra Bentley MRN: [id number] Date of Birth: May 29, 1933 No data recorded  Encounter Date: 06/21/2019  PT End of Session - 06/21/19 1520    Visit Number  10    Number of Visits  16    Date for PT Re-Evaluation  07/11/19    Authorization Type  Medicare, eval 4/40/1027, cert 2/53/6644    PT Start Time  1445    PT Stop Time  1525    PT Time Calculation (min)  40 min    Equipment Utilized During Treatment  Gait belt    Activity Tolerance  Patient tolerated treatment well    Behavior During Therapy  WFL for tasks assessed/performed       Past Medical History:  Diagnosis Date  . Allergic rhinitis, cause unspecified   . Cramp of limb   . Diverticulosis   . Epistaxis   . GERD (gastroesophageal reflux disease)   . HTN (hypertension)   . Osteopenia   . Other and unspecified hyperlipidemia   . Other malaise and fatigue   . Rheumatoid arthritis(714.0)   . Urine incontinence     Past Surgical History:  Procedure Laterality Date  . BREAST BIOPSY  2/11   fibrocystic change  . BREAST BIOPSY  8/11   fibrocystic change  . COLONOSCOPY  2003  . CYSTOSCOPY  2006  . MOLE REMOVAL     rectal  . TONSILLECTOMY      There were no vitals filed for this visit.  Subjective Assessment - 06/21/19 1519    Subjective  Patient is doing well.    Pertinent History  Patient is 86 yo female that  was referred to PT for strength and balance. PMH of fall abut a year ago and pelvic fx and R hip fx, GERD, HTN, OA, osteopenia, RA. Pt is husbands caregiver, performs all household tasks, independent (will walk around house without [facility]). Pt does have a history of visual issues. Patient reported that she falls, especially  with head turns, tends to fall backwards. Pt does complain of dizziness.    Limitations  Standing;Walking;House hold activities    How long can you sit comfortably?  NA    How long can you walk comfortably?  32mns (guesstimate)    Diagnostic tests  NA    Patient Stated Goals  not fall, walk better    Currently in Pain?  No/denies    Multiple Pain Sites  No         [facility] PT Assessment - 06/21/19 0001      Berg Balance Test   Sit to Stand  Able to stand  independently using hands    Standing Unsupported  Able to stand 2 minutes with supervision    Sitting with Back Unsupported but Feet Supported on Floor or Stool  Able to sit safely and securely 2 minutes    Stand to Sit  Sits safely with minimal use of hands    Transfers  Able to transfer safely, minor use of hands    Standing Unsupported with Eyes Closed  Able to stand 3 seconds    Standing Unsupported with Feet Together  Able to place feet together independently but unable to hold for 30 seconds  From Standing, Reach Forward with Outstretched Arm  Can reach forward >12 cm safely (5")    From Standing Position, Pick up Object from [place] to pick up shoe safely and easily    From Standing Position, Turn to Look Behind Over each Shoulder  Looks behind one side only/other side shows less weight shift    Turn 360 Degrees  Able to turn 360 degrees safely but slowly    Standing Unsupported, Alternately Place Feet on Step/Stool  Able to stand independently and complete 8 steps >20 seconds    Standing Unsupported, One Foot in Front  Able to take small step independently and hold 30 seconds    Standing on One Leg  Able to lift leg independently and hold equal to or more than 3 seconds    Total Score  41      Dynamic Gait Index   Level Surface  Mild Impairment    Change in Gait Speed  Mild Impairment    Gait with Horizontal Head Turns  Mild Impairment    Gait with Vertical Head Turns  Mild Impairment    Gait and Pivot Turn  Moderate  Impairment    Step Over Obstacle  Moderate Impairment    Step Around Obstacles  Moderate Impairment    Steps  Moderate Impairment    Total Score  12         Treatment: Neuromuscular Training:  Matrix: Fwd/bwd gait with 22. 5 lbs and CGA, cues for posture and stepping strategies, occasional LOB Side stepping left and right and CGA with cues to slow movement  Gait out in hallway, CGA with VCs for maintaining gait speed and step length Horizontal head turns x100 ft, naming cards while walking  Vertical head turns x100 ft Direction changes x100 ft, min difficulty with quick changes Speed changes x100 ft, min difficulty with slower gait speed and maintaining balance, mod difficulty with increased in gait speed   Berg balance test and DGI test    Pt educated throughout session about proper posture and technique with exercises. Improved exercise technique, movement at target joints, use of target muscles after min to mod verbal, visual, tactile cues.                   PT Education - 06/21/19 1519    Education Details  saftey with turns and head turns    Person(s) Educated  Patient    Methods  Explanation    Comprehension  Verbalized understanding;Returned demonstration;Need further instruction       PT Short Term Goals - 05/16/19 1429      PT SHORT TERM GOAL #1   Title  Pt will be independent with initial HEP in order to improve strength and balance in order to decrease fall risk and improve function at home and work.    Baseline  administered on eval 4/21    Time  4    Period  Weeks    Status  New    Target Date  06/13/19        PT Long Term Goals - 06/21/19 1520      PT LONG TERM GOAL #1   Title  Pt will be independent with HEP in order to improve strength and balance in order to decrease fall risk and improve function at home and work.    Time  8    Period  Weeks    Status  Partially Met      PT  LONG TERM GOAL #2   Title  Pt will improve DGI by  at least 6 points in order to demonstrate significant improvement in balance and decreased risk for falls.    Baseline  6/24, 06/21/19=12/24    Time  8    Period  Weeks    Status  Partially Met      PT LONG TERM GOAL #3   Title  Pt will improve BERG by at least 3 points in order to demonstrate clinically significant improvement in balance.    Baseline  34/56,  5/27= 41/56    Time  8    Period  Weeks    Status  Partially Met    Target Date  07/11/19      PT LONG TERM GOAL #4   Title  Pt will decrease 5TSTS by at least 3 seconds and no UE support in order to demonstrate clinically significant improvement in LE strength.    Baseline  17 seconds, bilateral hand support, 06/21/19= 15 sec    Time  8    Period  Weeks    Status  Partially Met    Target Date  07/11/19      PT LONG TERM GOAL #5   Title  Pt will improve 10 MWT to at least 1 m/s to indicate improved community ambulation and safety.    Baseline  .43 ms/ (quick pace) 06/21/19= .53 m/sec    Time  8    Status  Partially Met    Target Date  07/11/19            Plan - 06/21/19 1520    Clinical Impression Statement  Patient's condition has the potential to improve in response to therapy. Maximum improvement is yet to be obtained. The anticipated improvement is attainable and reasonable in a generally predictable time.  Patient reports that she is able to turn her head better. Patient instructed in beginning balance and coordination exercise. Patient required mod VCs and min A for gait to improve weight shift and postural control. Patient requires min VCs to improve ankle stability with gait.  Patients would benefit from additional skilled PT intervention to improve balance/gait safety and reduce fall risk.   Personal Factors and Comorbidities  Age;Comorbidity 3+    Comorbidities  GERD, previous falls, HTN, OA, RA    Examination-Activity Limitations  Squat;Stairs;Lift;Bed Mobility;Stand;Reach Overhead;Dressing;Carry;Caring for  Others;Transfers    Examination-Participation Restrictions  Laundry;Shop;Cleaning;Medication Management;Community Activity;Driving;Yard Work;Meal Prep    Stability/Clinical Decision Making  Stable/Uncomplicated    Rehab Potential  Good    PT Frequency  2x / week    PT Duration  8 weeks    PT Treatment/Interventions  ADLs/Self Care Home Management;Canalith Repostioning;Cryotherapy;Ultrasound;Traction;Moist Heat;Electrical Stimulation;DME Instruction;Gait training;Stair training;Balance training;Therapeutic exercise;Therapeutic activities;Functional mobility training;Neuromuscular re-education;Patient/family education;Passive range of motion;Energy conservation;Vestibular;Taping;Splinting;Spinal Manipulations;Joint Manipulations    PT Next Visit Plan  address balance/strength/ambulation    PT Home Exercise Plan  [email or web address]    Consulted and Agree with Plan of Care  Patient       Patient will benefit from skilled therapeutic intervention in order to improve the following deficits and impairments:  Abnormal gait, Decreased balance, Decreased endurance, Decreased mobility, Difficulty walking, Decreased activity tolerance, Decreased strength, Postural dysfunction  Visit Diagnosis: Muscle weakness (generalized)  Other abnormalities of gait and mobility     Problem List Patient Active Problem List   Diagnosis Date Noted  . Lump of left thigh 05/07/2019  . Poor balance 05/07/2019  . Vestibular dizziness 03/31/2018  .  Hip fracture, right ([place]) 02/27/2018  . Pedal edema 09/28/2017  . Fall at home 09/28/2017  . Prediabetes 11/30/2016  . Toenail fungus 10/03/2015  . At moderate risk for fall 09/05/2015  . Cervical spondylosis without myelopathy 08/05/2015  . Routine general medical examination at a health care facility 08/27/2014  . Colon cancer screening 08/27/2014  . Fall against object 08/15/2014  . Encounter for Medicare annual wellness exam 07/25/2013  . Varicosities of leg 05/23/2012   . Abnormal ultrasound of thyroid gland 03/01/2011  . Hyperlipidemia 06/11/2008  . Allergic rhinitis 03/29/2008  . Essential hypertension, benign 12/26/2006  . GERD 12/26/2006  . Rheumatoid arthritis ([place]) 12/26/2006  . Osteoporosis 12/26/2006  . URINARY INCONTINENCE 12/26/2006    Arelia Sneddon S,PT DPT 06/21/2019, 3:49 PM  [place] MAIN [facility] SERVICES [address] [place], [place], [address] Phone: [phone]   Fax:  [phone]  Name: Sierra Bentley MRN: [id number] Date of Birth: 1933-05-03

## 2019-06-26 ENCOUNTER — Encounter: Payer: Self-pay | Admitting: Physical Therapy

## 2019-06-26 ENCOUNTER — Other Ambulatory Visit: Payer: Self-pay

## 2019-06-26 ENCOUNTER — Ambulatory Visit: Payer: Medicare PPO | Attending: Family Medicine | Admitting: Physical Therapy

## 2019-06-26 DIAGNOSIS — M6281 Muscle weakness (generalized): Secondary | ICD-10-CM | POA: Diagnosis not present

## 2019-06-26 DIAGNOSIS — R2689 Other abnormalities of gait and mobility: Secondary | ICD-10-CM

## 2019-06-26 NOTE — Therapy (Signed)
[place] MAIN [facility] SERVICES [address] [place], [place], [address] Phone: [phone]   Fax:  [phone]  Physical Therapy Treatment  Patient Details  Name: Sierra Bentley MRN: [id number] Date of Birth: [birth year] No data recorded  Encounter Date: 06/26/2019  PT End of Session - 06/26/19 1620    Visit Number  11    Number of Visits  16    Date for PT Re-Evaluation  07/11/19    Authorization Type  Medicare, eval 08/06/4578, cert 9/98/3382    PT Start Time  1433    PT Stop Time  1513    PT Time Calculation (min)  40 min    Equipment Utilized During Treatment  Gait belt    Activity Tolerance  Patient tolerated treatment well    Behavior During Therapy  WFL for tasks assessed/performed       Past Medical History:  Diagnosis Date  . Allergic rhinitis, cause unspecified   . Cramp of limb   . Diverticulosis   . Epistaxis   . GERD (gastroesophageal reflux disease)   . HTN (hypertension)   . Osteopenia   . Other and unspecified hyperlipidemia   . Other malaise and fatigue   . Rheumatoid arthritis(714.0)   . Urine incontinence     Past Surgical History:  Procedure Laterality Date  . BREAST BIOPSY  2/11   fibrocystic change  . BREAST BIOPSY  8/11   fibrocystic change  . COLONOSCOPY  2003  . CYSTOSCOPY  2006  . MOLE REMOVAL     rectal  . TONSILLECTOMY      There were no vitals filed for this visit.  Subjective Assessment - 06/26/19 1620    Subjective  Patient is doing well.    Pertinent History  Patient is 86 yo female that  was referred to PT for strength and balance. PMH of fall abut a year ago and pelvic fx and R hip fx, GERD, HTN, OA, osteopenia, RA. Pt is husbands caregiver, performs all household tasks, independent (will walk around house without [facility]). Pt does have a history of visual issues. Patient reported that she falls, especially with head turns, tends to fall backwards. Pt does complain of dizziness.     Limitations  Standing;Walking;House hold activities    How long can you sit comfortably?  NA    How long can you walk comfortably?  56mns (guesstimate)    Diagnostic tests  NA    Patient Stated Goals  not fall, walk better       Neuromuscular Re-education   Tandem gait on 2"x4" without UE support x 2 lengths Side stepping on 2"x4" without UE support x 2 lengths Heel/toe raises without UE support 3s hold x 10 each 1/2 foam roll balance with flat side up 30s x 2 reps 1/2 foam roll balance with flat side down 30s x 2 reps 1/2 foam roll tandem balance alternating forward LE 30s x 2 each LE forward Lateral side steps from foam to 6 inch stool left and right x 15 Backwards stepping from foam to 6 inch stool x 15        Pt educated throughout session about proper posture and technique with exercises. Improved exercise technique, movement at target joints, use of target muscles after min to mod verbal, visual, tactile cues. CGA and Min to mod verbal cues used throughout with increased in postural sway and LOB most seen with narrow base of support and while on uneven surfaces.  PT Short Term Goals - 05/16/19 1429      PT SHORT TERM GOAL #1   Title  Pt will be independent with initial HEP in order to improve strength and balance in order to decrease fall risk and improve function at home and work.    Baseline  administered on eval 4/21    Time  4    Period  Weeks    Status  New    Target Date  06/13/19        PT Long Term Goals - 06/21/19 1520      PT LONG TERM GOAL #1   Title  Pt will be independent with HEP in order to improve strength and balance in order to decrease fall risk and improve function at home and work.    Time  8    Period  Weeks    Status  Partially Met      PT LONG TERM GOAL #2   Title  Pt will improve DGI by at least 6 points in order to demonstrate significant improvement in balance and decreased risk for falls.     Baseline  6/24, 06/21/19=12/24    Time  8    Period  Weeks    Status  Partially Met      PT LONG TERM GOAL #3   Title  Pt will improve BERG by at least 3 points in order to demonstrate clinically significant improvement in balance.    Baseline  34/56,  5/27= 41/56    Time  8    Period  Weeks    Status  Partially Met    Target Date  07/11/19      PT LONG TERM GOAL #4   Title  Pt will decrease 5TSTS by at least 3 seconds and no UE support in order to demonstrate clinically significant improvement in LE strength.    Baseline  17 seconds, bilateral hand support, 06/21/19= 15 sec    Time  8    Period  Weeks    Status  Partially Met    Target Date  07/11/19      PT LONG TERM GOAL #5   Title  Pt will improve 10 MWT to at least 1 m/s to indicate improved community ambulation and safety.    Baseline  .43 ms/ (quick pace) 06/21/19= .53 m/sec    Time  8    Status  Partially Met    Target Date  07/11/19            Plan - 06/26/19 1621    Clinical Impression Statement Patient instructed in intermediate strengthening and balance exercise.  Patient requires min Vcs for correct exercise technique including to improve LE control with standing exercise. Patient demonstrates better quad control with SLS tasks with rail assist. Patient would benefit from additional skilled PT intervention to improve balance/gait safety and reduce fall risk.    Personal Factors and Comorbidities  Age;Comorbidity 3+    Comorbidities  GERD, previous falls, HTN, OA, RA    Examination-Activity Limitations  Squat;Stairs;Lift;Bed Mobility;Stand;Reach Overhead;Dressing;Carry;Caring for Others;Transfers    Examination-Participation Restrictions  Laundry;Shop;Cleaning;Medication Management;Community Activity;Driving;Yard Work;Meal Prep    Stability/Clinical Decision Making  Stable/Uncomplicated    Rehab Potential  Good    PT Frequency  2x / week    PT Duration  8 weeks    PT Treatment/Interventions  ADLs/Self Care  Home Management;Canalith Repostioning;Cryotherapy;Ultrasound;Traction;Moist Heat;Electrical Stimulation;DME Instruction;Gait training;Stair training;Balance training;Therapeutic exercise;Therapeutic activities;Functional mobility training;Neuromuscular re-education;Patient/family education;Passive range of motion;Energy conservation;Vestibular;Taping;Splinting;Spinal  Manipulations;Joint Manipulations    PT Next Visit Plan  address balance/strength/ambulation    PT Home Exercise Plan  [email or web address]    Consulted and Agree with Plan of Care  Patient       Patient will benefit from skilled therapeutic intervention in order to improve the following deficits and impairments:  Abnormal gait, Decreased balance, Decreased endurance, Decreased mobility, Difficulty walking, Decreased activity tolerance, Decreased strength, Postural dysfunction  Visit Diagnosis: Muscle weakness (generalized)  Other abnormalities of gait and mobility     Problem List Patient Active Problem List   Diagnosis Date Noted  . Lump of left thigh 05/07/2019  . Poor balance 05/07/2019  . Vestibular dizziness 03/31/2018  . Hip fracture, right ([place]) 02/27/2018  . Pedal edema 09/28/2017  . Fall at home 09/28/2017  . Prediabetes 11/30/2016  . Toenail fungus 10/03/2015  . At moderate risk for fall 09/05/2015  . Cervical spondylosis without myelopathy 08/05/2015  . Routine general medical examination at a health care facility 08/27/2014  . Colon cancer screening 08/27/2014  . Fall against object 08/15/2014  . Encounter for Medicare annual wellness exam 07/25/2013  . Varicosities of leg 05/23/2012  . Abnormal ultrasound of thyroid gland 03/01/2011  . Hyperlipidemia 06/11/2008  . Allergic rhinitis 03/29/2008  . Essential hypertension, benign 12/26/2006  . GERD 12/26/2006  . Rheumatoid arthritis ([place]) 12/26/2006  . Osteoporosis 12/26/2006  . URINARY INCONTINENCE 12/26/2006    Alanson Puls, [place] DPT 06/26/2019, 4:22  PM  [place] MAIN [facility] SERVICES [address] [place], [place], [address] Phone: [phone]   Fax:  [phone]  Name: Sierra Bentley MRN: [id number] Date of Birth: 03-17-1933

## 2019-06-28 ENCOUNTER — Other Ambulatory Visit: Payer: Self-pay

## 2019-06-28 ENCOUNTER — Ambulatory Visit: Payer: Medicare PPO | Admitting: Physical Therapy

## 2019-06-28 ENCOUNTER — Encounter: Payer: Self-pay | Admitting: Physical Therapy

## 2019-06-28 DIAGNOSIS — R2689 Other abnormalities of gait and mobility: Secondary | ICD-10-CM

## 2019-06-28 DIAGNOSIS — M6281 Muscle weakness (generalized): Secondary | ICD-10-CM | POA: Diagnosis not present

## 2019-06-28 NOTE — Therapy (Signed)
[place] MAIN [facility] SERVICES [address] [place], [place], [address] Phone: [phone]   Fax:  [phone]  Physical Therapy Treatment  Patient Details  Name: Sierra Bentley MRN: [id number] Date of Birth: 1933-08-18 No data recorded  Encounter Date: 06/28/2019  PT End of Session - 06/28/19 1452    Visit Number  12    Number of Visits  16    Date for PT Re-Evaluation  07/11/19    Authorization Type  Medicare, eval 04/07/9700, cert 6/37/8588    PT Start Time  1445    PT Stop Time  1515    PT Time Calculation (min)  30 min    Equipment Utilized During Treatment  Gait belt    Activity Tolerance  Patient tolerated treatment well    Behavior During Therapy  WFL for tasks assessed/performed       Past Medical History:  Diagnosis Date   Allergic rhinitis, cause unspecified    Cramp of limb    Diverticulosis    Epistaxis    GERD (gastroesophageal reflux disease)    HTN (hypertension)    Osteopenia    Other and unspecified hyperlipidemia    Other malaise and fatigue    Rheumatoid arthritis(714.0)    Urine incontinence     Past Surgical History:  Procedure Laterality Date   BREAST BIOPSY  2/11   fibrocystic change   BREAST BIOPSY  8/11   fibrocystic change   COLONOSCOPY  2003   CYSTOSCOPY  2006   MOLE REMOVAL     rectal   TONSILLECTOMY      There were no vitals filed for this visit.  Subjective Assessment - 06/28/19 1451    Subjective  Patient is doing well.    Pertinent History  Patient is 86 yo female that  was referred to PT for strength and balance. PMH of fall abut a year ago and pelvic fx and R hip fx, GERD, HTN, OA, osteopenia, RA. Pt is husbands caregiver, performs all household tasks, independent (will walk around house without [facility]). Pt does have a history of visual issues. Patient reported that she falls, especially with head turns, tends to fall backwards. Pt does complain of dizziness.    Limitations  Standing;Walking;House hold activities    How long can you sit comfortably?  NA    How long can you walk comfortably?  25mns (guesstimate)    Diagnostic tests  NA    Patient Stated Goals  not fall, walk better       Neuromuscular Re-education  Hurdle fwd/bwd, side to side x 20 each direction Tandem stand  on 1/2 foam without UE support x 2 lengths Side stepping on balance foam  without UE support x 2 lengths Heel/toe raises without UE support 3s hold x 10 each 1/2 foam roll balance with flat side up 30s x 2 reps 1/2 foam roll balance with flat side down 30s x 2 reps 1/2 foam roll tandem balance alternating forward LE 30s x 2 each LE forward Lateral side steps from foam to 6 inch stool left and right x 15 Backwards stepping from foam to 6 inch stool x 15  [place] fwd/bwd, side to side , diagonal x 10 ,cues for posture and stepping strategies, occasional LOB Star stepping left and right x 10       Pt educated throughout session about proper posture and technique with exercises. Improved exercise technique, movement at target joints, use of target muscles after  min to mod verbal, visual, tactile cues. CGA and Min to mod verbal cues used throughout with increased in postural sway and LOB most seen with narrow base of support and while on uneven surfaces                        PT Education - 06/28/19 1451    Education Details  safety with head turns    Person(s) Educated  Patient    Methods  Explanation    Comprehension  Verbalized understanding       PT Short Term Goals - 05/16/19 1429      PT SHORT TERM GOAL #1   Title  Pt will be independent with initial HEP in order to improve strength and balance in order to decrease fall risk and improve function at home and work.    Baseline  administered on eval 4/21    Time  4    Period  Weeks    Status  New    Target Date  06/13/19        PT Long Term Goals - 06/21/19 1520      PT LONG TERM  GOAL #1   Title  Pt will be independent with HEP in order to improve strength and balance in order to decrease fall risk and improve function at home and work.    Time  8    Period  Weeks    Status  Partially Met      PT LONG TERM GOAL #2   Title  Pt will improve DGI by at least 6 points in order to demonstrate significant improvement in balance and decreased risk for falls.    Baseline  6/24, 06/21/19=12/24    Time  8    Period  Weeks    Status  Partially Met      PT LONG TERM GOAL #3   Title  Pt will improve BERG by at least 3 points in order to demonstrate clinically significant improvement in balance.    Baseline  34/56,  5/27= 41/56    Time  8    Period  Weeks    Status  Partially Met    Target Date  07/11/19      PT LONG TERM GOAL #4   Title  Pt will decrease 5TSTS by at least 3 seconds and no UE support in order to demonstrate clinically significant improvement in LE strength.    Baseline  17 seconds, bilateral hand support, 06/21/19= 15 sec    Time  8    Period  Weeks    Status  Partially Met    Target Date  07/11/19      PT LONG TERM GOAL #5   Title  Pt will improve 10 MWT to at least 1 m/s to indicate improved community ambulation and safety.    Baseline  .43 ms/ (quick pace) 06/21/19= .53 m/sec    Time  8    Status  Partially Met    Target Date  07/11/19            Plan - 06/28/19 1452    Clinical Impression Statement  Patient instructed in advanced LE strengthening and intermediate balance exercise. Patient required min VCS to improve weight shift and to increase weight shift  for better stance control. Patient would benefit from additional skilled PT intervention to improve strength, balance and gait safety.    Personal Factors and Comorbidities  Age;Comorbidity 3+  Comorbidities  GERD, previous falls, HTN, OA, RA    Examination-Activity Limitations  Squat;Stairs;Lift;Bed Mobility;Stand;Reach Overhead;Dressing;Carry;Caring for Others;Transfers     Examination-Participation Restrictions  Laundry;Shop;Cleaning;Medication Management;Community Activity;Driving;Yard Work;Meal Prep    Stability/Clinical Decision Making  Stable/Uncomplicated    Rehab Potential  Good    PT Frequency  2x / week    PT Duration  8 weeks    PT Treatment/Interventions  ADLs/Self Care Home Management;Canalith Repostioning;Cryotherapy;Ultrasound;Traction;Moist Heat;Electrical Stimulation;DME Instruction;Gait training;Stair training;Balance training;Therapeutic exercise;Therapeutic activities;Functional mobility training;Neuromuscular re-education;Patient/family education;Passive range of motion;Energy conservation;Vestibular;Taping;Splinting;Spinal Manipulations;Joint Manipulations    PT Next Visit Plan  address balance/strength/ambulation    PT Home Exercise Plan  [email or web address]    Consulted and Agree with Plan of Care  Patient       Patient will benefit from skilled therapeutic intervention in order to improve the following deficits and impairments:  Abnormal gait, Decreased balance, Decreased endurance, Decreased mobility, Difficulty walking, Decreased activity tolerance, Decreased strength, Postural dysfunction  Visit Diagnosis: Other abnormalities of gait and mobility  Muscle weakness (generalized)     Problem List Patient Active Problem List   Diagnosis Date Noted   Lump of left thigh 05/07/2019   Poor balance 05/07/2019   Vestibular dizziness 03/31/2018   Hip fracture, right ([place]) 02/27/2018   Pedal edema 09/28/2017   Fall at home 09/28/2017   Prediabetes 11/30/2016   Toenail fungus 10/03/2015   At moderate risk for fall 09/05/2015   Cervical spondylosis without myelopathy 08/05/2015   Routine general medical examination at a health care facility 08/27/2014   Colon cancer screening 08/27/2014   Fall against object 08/15/2014   Encounter for Medicare annual wellness exam 07/25/2013   Varicosities of leg 05/23/2012   Abnormal ultrasound  of thyroid gland 03/01/2011   Hyperlipidemia 06/11/2008   Allergic rhinitis 03/29/2008   Essential hypertension, benign 12/26/2006   GERD 12/26/2006   Rheumatoid arthritis ([place]) 12/26/2006   Osteoporosis 12/26/2006   URINARY INCONTINENCE 12/26/2006    Alanson Puls, PT DPT 06/28/2019, 2:55 PM  [place] MAIN [facility] SERVICES [address] [place], [place], [address] Phone: [phone]   Fax:  [phone]  Name: Sierra Bentley MRN: [id number] Date of Birth: 06/27/1933

## 2019-07-03 ENCOUNTER — Ambulatory Visit: Payer: Medicare PPO | Admitting: Physical Therapy

## 2019-07-05 ENCOUNTER — Ambulatory Visit: Payer: Medicare PPO | Admitting: Physical Therapy

## 2019-07-05 ENCOUNTER — Encounter: Payer: Self-pay | Admitting: Physical Therapy

## 2019-07-05 ENCOUNTER — Other Ambulatory Visit: Payer: Self-pay

## 2019-07-05 DIAGNOSIS — R2689 Other abnormalities of gait and mobility: Secondary | ICD-10-CM

## 2019-07-05 DIAGNOSIS — M6281 Muscle weakness (generalized): Secondary | ICD-10-CM | POA: Diagnosis not present

## 2019-07-05 NOTE — Therapy (Signed)
[place] MAIN [facility] SERVICES [address] [place], [place], [address] Phone: [phone]   Fax:  [phone]  Physical Therapy Treatment  Patient Details  Name: Sierra Bentley MRN: [id number] Date of Birth: January 06, 1934 No data recorded  Encounter Date: 07/05/2019   PT End of Session - 07/05/19 1437    Visit Number 13    Number of Visits 16    Date for PT Re-Evaluation 07/11/19    Authorization Type Medicare, eval 0/34/9179, cert 1/50/5697    PT Start Time 0233    PT Stop Time 0311    PT Time Calculation (min) 38 min    Equipment Utilized During Treatment Gait belt    Activity Tolerance Patient tolerated treatment well    Behavior During Therapy [facility] for tasks assessed/performed           Past Medical History:  Diagnosis Date  . Allergic rhinitis, cause unspecified   . Cramp of limb   . Diverticulosis   . Epistaxis   . GERD (gastroesophageal reflux disease)   . HTN (hypertension)   . Osteopenia   . Other and unspecified hyperlipidemia   . Other malaise and fatigue   . Rheumatoid arthritis(714.0)   . Urine incontinence     Past Surgical History:  Procedure Laterality Date  . BREAST BIOPSY  2/11   fibrocystic change  . BREAST BIOPSY  8/11   fibrocystic change  . COLONOSCOPY  2003  . CYSTOSCOPY  2006  . MOLE REMOVAL     rectal  . TONSILLECTOMY      There were no vitals filed for this visit.   Subjective Assessment - 07/05/19 1436    Subjective Patient is doing well.    Pertinent History Patient is 86 yo female that  was referred to PT for strength and balance. PMH of fall abut a year ago and pelvic fx and R hip fx, GERD, HTN, OA, osteopenia, RA. Pt is husbands caregiver, performs all household tasks, independent (will walk around house without [facility]). Pt does have a history of visual issues. Patient reported that she falls, especially with head turns, tends to fall backwards. Pt does complain of dizziness.    Limitations  Standing;Walking;House hold activities    How long can you sit comfortably? NA    How long can you walk comfortably? 58mns (guesstimate)    Diagnostic tests NA    Patient Stated Goals not fall, walk better    Currently in Pain? No/denies    Multiple Pain Sites No             Treatment: Treatment: Neuromuscular Training: Stool: staggered stance, head turns side/side, up/down x 5 reps each, each foot in front; VCs for proper technique and positioning for each exercise  Hurdle: Forward and backward stepping over hurdle x15 each direction, VCs to take big enough steps and to try to increase speed to work on coordination  Side stepping over hurdle 15x each direction  Blue Foam: Side stepping x10 on blue balance CGA for safety, VCs for taking a big enough step  Airex pad trunk rotation x2 min, CGA for safety, demonstrated difficulty with keeping arms extended and full rotation with head turn Airex pad, balloon tapping to mirror x2 min, supervision for safety with varying directions and speed of balloon, VCs for utilizing both hands and minimizing UE support  Star stepping ,L , R x 10 , cues for going slow and to control the speed  LEG PRESS  40 LBS X 20 X 3 SETS    Pt educated throughout session about proper posture and technique with exercises. Improved exercise technique, movement at target joints, use of target muscles after min to mod verbal, visual, tactile cues. CGA and Min to mod verbal cues used throughout with increased in postural sway and LOB most seen with narrow base of support and while on uneven surfaces                        PT Education - 07/05/19 1436    Education Details safety with single leg stepping and head turns    Person(s) Educated Patient    Methods Explanation    Comprehension Verbalized understanding;Tactile cues required;Need further instruction;Verbal cues required            PT Short Term Goals - 05/16/19 1429      PT  SHORT TERM GOAL #1   Title Pt will be independent with initial HEP in order to improve strength and balance in order to decrease fall risk and improve function at home and work.    Baseline administered on eval 4/21    Time 4    Period Weeks    Status New    Target Date 06/13/19             PT Long Term Goals - 07/05/19 1452      PT LONG TERM GOAL #1   Title Pt will be independent with HEP in order to improve strength and balance in order to decrease fall risk and improve function at home and work.    Time 8    Period Weeks    Status Partially Met    Target Date 09/04/19      PT LONG TERM GOAL #2   Title Pt will improve DGI by at least 6 points in order to demonstrate significant improvement in balance and decreased risk for falls.    Baseline 6/24, 06/21/19=12/24    Time 8    Period Weeks    Status Partially Met    Target Date 09/04/19      PT LONG TERM GOAL #3   Title Pt will improve BERG by at least 3 points in order to demonstrate clinically significant improvement in balance.    Baseline 34/56,  5/27= 41/56    Time 8    Period Weeks    Status Partially Met    Target Date 09/04/19      PT LONG TERM GOAL #4   Title Pt will decrease 5TSTS by at least 3 seconds and no UE support in order to demonstrate clinically significant improvement in LE strength.    Baseline 17 seconds, bilateral hand support, 06/21/19= 15 sec    Time 8    Period Weeks    Status Partially Met    Target Date 09/04/19      PT LONG TERM GOAL #5   Title Pt will improve 10 MWT to at least 1 m/s to indicate improved community ambulation and safety.    Baseline .43 ms/ (quick pace) 06/21/19= .53 m/sec    Time 8    Status Partially Met    Target Date 09/04/19                 Plan - 07/05/19 1437    Clinical Impression Statement Pt presents with unsteadiness on uneven surfaces and fatigues with therapeutic exercises. Patient needs assist with instruction for balance with side stepping  on  uneven surfaces and needs CGA assist with balance activities. Patient demonstrates difficulty with side stepping and navigating small spaces with decreased base of support and increased challenges for LE.  Patient tolerated all interventions well this date and will benefit from continued skilled PT interventions to improve strength and balance and decrease risk of falling   Personal Factors and Comorbidities Age;Comorbidity 3+    Comorbidities GERD, previous falls, HTN, OA, RA    Examination-Activity Limitations Squat;Stairs;Lift;Bed Mobility;Stand;Reach Overhead;Dressing;Carry;Caring for Others;Transfers    Examination-Participation Restrictions Laundry;Shop;Cleaning;Medication Management;Community Activity;Driving;Yard Work;Meal Prep    Stability/Clinical Decision Making Stable/Uncomplicated    Rehab Potential Good    PT Frequency 2x / week    PT Duration 8 weeks    PT Treatment/Interventions ADLs/Self Care Home Management;Canalith Repostioning;Cryotherapy;Ultrasound;Traction;Moist Heat;Electrical Stimulation;DME Instruction;Gait training;Stair training;Balance training;Therapeutic exercise;Therapeutic activities;Functional mobility training;Neuromuscular re-education;Patient/family education;Passive range of motion;Energy conservation;Vestibular;Taping;Splinting;Spinal Manipulations;Joint Manipulations    PT Next Visit Plan address balance/strength/ambulation    PT Home Exercise Plan [email or web address]    Consulted and Agree with Plan of Care Patient           Patient will benefit from skilled therapeutic intervention in order to improve the following deficits and impairments:  Abnormal gait, Decreased balance, Decreased endurance, Decreased mobility, Difficulty walking, Decreased activity tolerance, Decreased strength, Postural dysfunction  Visit Diagnosis: Other abnormalities of gait and mobility  Muscle weakness (generalized)     Problem List Patient Active Problem List   Diagnosis Date  Noted  . Lump of left thigh 05/07/2019  . Poor balance 05/07/2019  . Vestibular dizziness 03/31/2018  . Hip fracture, right ([place]) 02/27/2018  . Pedal edema 09/28/2017  . Fall at home 09/28/2017  . Prediabetes 11/30/2016  . Toenail fungus 10/03/2015  . At moderate risk for fall 09/05/2015  . Cervical spondylosis without myelopathy 08/05/2015  . Routine general medical examination at a health care facility 08/27/2014  . Colon cancer screening 08/27/2014  . Fall against object 08/15/2014  . Encounter for Medicare annual wellness exam 07/25/2013  . Varicosities of leg 05/23/2012  . Abnormal ultrasound of thyroid gland 03/01/2011  . Hyperlipidemia 06/11/2008  . Allergic rhinitis 03/29/2008  . Essential hypertension, benign 12/26/2006  . GERD 12/26/2006  . Rheumatoid arthritis ([place]) 12/26/2006  . Osteoporosis 12/26/2006  . URINARY INCONTINENCE 12/26/2006    Alanson Puls, [place] DPT 07/05/2019, 2:54 PM  [place] MAIN [facility] SERVICES [address] [place], [place], [address] Phone: [phone]   Fax:  [phone]  Name: Sierra Bentley MRN: [id number] Date of Birth: 1933/10/04

## 2019-07-09 DIAGNOSIS — H353120 Nonexudative age-related macular degeneration, left eye, stage unspecified: Secondary | ICD-10-CM | POA: Diagnosis not present

## 2019-07-09 DIAGNOSIS — H353211 Exudative age-related macular degeneration, right eye, with active choroidal neovascularization: Secondary | ICD-10-CM | POA: Diagnosis not present

## 2019-07-10 ENCOUNTER — Ambulatory Visit: Payer: Medicare PPO | Admitting: Physical Therapy

## 2019-07-12 ENCOUNTER — Encounter: Payer: Self-pay | Admitting: Physical Therapy

## 2019-07-12 ENCOUNTER — Other Ambulatory Visit: Payer: Self-pay

## 2019-07-12 ENCOUNTER — Ambulatory Visit: Payer: Medicare PPO | Admitting: Physical Therapy

## 2019-07-12 DIAGNOSIS — R2689 Other abnormalities of gait and mobility: Secondary | ICD-10-CM

## 2019-07-12 DIAGNOSIS — M6281 Muscle weakness (generalized): Secondary | ICD-10-CM | POA: Diagnosis not present

## 2019-07-12 NOTE — Therapy (Signed)
[place] MAIN [facility] SERVICES [address] [place], [place], [address] Phone: [phone]   Fax:  [phone]  Physical Therapy Treatment  Patient Details  Name: Sierra Bentley MRN: [id number] Date of Birth: 04/25/1933 No data recorded  Encounter Date: 07/12/2019   PT End of Session - 07/12/19 1434    Visit Number 14    Number of Visits 16    Date for PT Re-Evaluation 09/04/19    Authorization Type Medicare, eval 9/73/5329, cert 10/18/2681    PT Start Time 0230    PT Stop Time 0312    PT Time Calculation (min) 42 min    Equipment Utilized During Treatment Gait belt    Activity Tolerance Patient tolerated treatment well    Behavior During Therapy WFL for tasks assessed/performed           Past Medical History:  Diagnosis Date  . Allergic rhinitis, cause unspecified   . Cramp of limb   . Diverticulosis   . Epistaxis   . GERD (gastroesophageal reflux disease)   . HTN (hypertension)   . Osteopenia   . Other and unspecified hyperlipidemia   . Other malaise and fatigue   . Rheumatoid arthritis(714.0)   . Urine incontinence     Past Surgical History:  Procedure Laterality Date  . BREAST BIOPSY  2/11   fibrocystic change  . BREAST BIOPSY  8/11   fibrocystic change  . COLONOSCOPY  2003  . CYSTOSCOPY  2006  . MOLE REMOVAL     rectal  . TONSILLECTOMY      There were no vitals filed for this visit.   Subjective Assessment - 07/12/19 1433    Subjective Patient is doing well.    Pertinent History Patient is 86 yo female that  was referred to PT for strength and balance. PMH of fall abut a year ago and pelvic fx and R hip fx, GERD, HTN, OA, osteopenia, RA. Pt is husbands caregiver, performs all household tasks, independent (will walk around house without [facility]). Pt does have a history of visual issues. Patient reported that she falls, especially with head turns, tends to fall backwards. Pt does complain of dizziness.    Limitations  Standing;Walking;House hold activities    How long can you sit comfortably? NA    How long can you walk comfortably? 86mns (guesstimate)    Diagnostic tests NA    Patient Stated Goals not fall, walk better    Currently in Pain? No/denies    Multiple Pain Sites No              Treatment: Neuromuscular Training: Stool: staggered stance, head turns side/side, up/down x 5 reps each, each foot in front; VCs for proper technique and positioning for each exercise staggered stance, trunk rotation with 2 lb rod, VC to keep UE straight and turn head with trunk  1/2 foam Forward and backward stepping over 1/2 foam  x15 each direction, VCs to take big enough steps and to try to increase speed to work on coordination  Side stepping over 1/2 foam15x each direction  Blue Foam: Side stepping x10 on blue balance CGA for safety, VCs for taking a big enough step  Airex pad trunk rotation x2 min, CGA for safety, demonstrated difficulty with keeping arms extended and full rotation with head turn Airex pad, balloon tapping to mirror x2 min, supervision for safety with varying directions and speed of balloon, VCs for utilizing both hands and minimizing UE support  BOSU: Lunge to BOSU ball x 10 , cues for going slow and to control the speed  Floor Star exercise: Performed stepping on the star diagram on the floor. Working on weight-shifting forward onto the foot that stepped forward and then weight-shifting back and bringing her feet back together with CGA. Performed 2 reps each foot.       Pt educated throughout session about proper posture and technique with exercises. Improved exercise technique, movement at target joints, use of target muscles after min to mod verbal, visual, tactile cues. CGA and Min to mod verbal cues used throughout with increased in postural sway and LOB most seen with narrow base of support and while on uneven surfaces. Continues to have balance deficits typical with  diagnosis.                        PT Education - 07/12/19 1434    Education Details saftey with gait and mobiity    Person(s) Educated Patient    Methods Explanation    Comprehension Verbalized understanding;Verbal cues required;Tactile cues required            PT Short Term Goals - 05/16/19 1429      PT SHORT TERM GOAL #1   Title Pt will be independent with initial HEP in order to improve strength and balance in order to decrease fall risk and improve function at home and work.    Baseline administered on eval 4/21    Time 4    Period Weeks    Status New    Target Date 06/13/19             PT Long Term Goals - 07/05/19 1452      PT LONG TERM GOAL #1   Title Pt will be independent with HEP in order to improve strength and balance in order to decrease fall risk and improve function at home and work.    Time 8    Period Weeks    Status Partially Met    Target Date 09/04/19      PT LONG TERM GOAL #2   Title Pt will improve DGI by at least 6 points in order to demonstrate significant improvement in balance and decreased risk for falls.    Baseline 6/24, 06/21/19=12/24    Time 8    Period Weeks    Status Partially Met    Target Date 09/04/19      PT LONG TERM GOAL #3   Title Pt will improve BERG by at least 3 points in order to demonstrate clinically significant improvement in balance.    Baseline 34/56,  5/27= 41/56    Time 8    Period Weeks    Status Partially Met    Target Date 09/04/19      PT LONG TERM GOAL #4   Title Pt will decrease 5TSTS by at least 3 seconds and no UE support in order to demonstrate clinically significant improvement in LE strength.    Baseline 17 seconds, bilateral hand support, 06/21/19= 15 sec    Time 8    Period Weeks    Status Partially Met    Target Date 09/04/19      PT LONG TERM GOAL #5   Title Pt will improve 10 MWT to at least 1 m/s to indicate improved community ambulation and safety.    Baseline .43 ms/  (quick pace) 06/21/19= .53 m/sec    Time 8  Status Partially Met    Target Date 09/04/19                 Plan - 07/12/19 1434    Clinical Impression Statement Patient requires verbal and tactile cueing during tandem balance  and required CGA during all dynamic standing balance activities.  Patient has also reported improved balance with improvement. Patient will continue to benefit from skilled therapy in order to decrease risk for falls and improve dynamic standing balance.   Personal Factors and Comorbidities Age;Comorbidity 3+    Comorbidities GERD, previous falls, HTN, OA, RA    Examination-Activity Limitations Squat;Stairs;Lift;Bed Mobility;Stand;Reach Overhead;Dressing;Carry;Caring for Others;Transfers    Examination-Participation Restrictions Laundry;Shop;Cleaning;Medication Management;Community Activity;Driving;Yard Work;Meal Prep    Stability/Clinical Decision Making Stable/Uncomplicated    Rehab Potential Good    PT Frequency 2x / week    PT Duration 8 weeks    PT Treatment/Interventions ADLs/Self Care Home Management;Canalith Repostioning;Cryotherapy;Ultrasound;Traction;Moist Heat;Electrical Stimulation;DME Instruction;Gait training;Stair training;Balance training;Therapeutic exercise;Therapeutic activities;Functional mobility training;Neuromuscular re-education;Patient/family education;Passive range of motion;Energy conservation;Vestibular;Taping;Splinting;Spinal Manipulations;Joint Manipulations    PT Next Visit Plan address balance/strength/ambulation    PT Home Exercise Plan [email or web address]    Consulted and Agree with Plan of Care Patient           Patient will benefit from skilled therapeutic intervention in order to improve the following deficits and impairments:  Abnormal gait, Decreased balance, Decreased endurance, Decreased mobility, Difficulty walking, Decreased activity tolerance, Decreased strength, Postural dysfunction  Visit Diagnosis: Other abnormalities of  gait and mobility  Muscle weakness (generalized)     Problem List Patient Active Problem List   Diagnosis Date Noted  . Lump of left thigh 05/07/2019  . Poor balance 05/07/2019  . Vestibular dizziness 03/31/2018  . Hip fracture, right ([place]) 02/27/2018  . Pedal edema 09/28/2017  . Fall at home 09/28/2017  . Prediabetes 11/30/2016  . Toenail fungus 10/03/2015  . At moderate risk for fall 09/05/2015  . Cervical spondylosis without myelopathy 08/05/2015  . Routine general medical examination at a health care facility 08/27/2014  . Colon cancer screening 08/27/2014  . Fall against object 08/15/2014  . Encounter for Medicare annual wellness exam 07/25/2013  . Varicosities of leg 05/23/2012  . Abnormal ultrasound of thyroid gland 03/01/2011  . Hyperlipidemia 06/11/2008  . Allergic rhinitis 03/29/2008  . Essential hypertension, benign 12/26/2006  . GERD 12/26/2006  . Rheumatoid arthritis ([place]) 12/26/2006  . Osteoporosis 12/26/2006  . URINARY INCONTINENCE 12/26/2006    Alanson Puls, [place] DPT 07/12/2019, 3:00 PM  [place] MAIN [facility] SERVICES [address] [place], [place], [address] Phone: [phone]   Fax:  [phone]  Name: Sierra Bentley MRN: [id number] Date of Birth: Oct 26, 1933

## 2019-07-17 ENCOUNTER — Ambulatory Visit: Payer: Medicare PPO | Admitting: Physical Therapy

## 2019-07-17 ENCOUNTER — Encounter: Payer: Self-pay | Admitting: Physical Therapy

## 2019-07-17 ENCOUNTER — Other Ambulatory Visit: Payer: Self-pay

## 2019-07-17 DIAGNOSIS — M6281 Muscle weakness (generalized): Secondary | ICD-10-CM | POA: Diagnosis not present

## 2019-07-17 DIAGNOSIS — R2689 Other abnormalities of gait and mobility: Secondary | ICD-10-CM

## 2019-07-17 NOTE — Therapy (Signed)
[place] MAIN [facility] SERVICES [address] [place], [place], [address] Phone: [phone]   Fax:  [phone]  Physical Therapy Treatment  Patient Details  Name: Sierra Bentley MRN: [id number] Date of Birth: 27-Dec-1933 No data recorded  Encounter Date: 07/17/2019   PT End of Session - 07/17/19 1437    Visit Number 15    Number of Visits 16    Date for PT Re-Evaluation 09/04/19    Authorization Type Medicare, eval 7/74/1287, cert 8/67/6720    PT Start Time 0231    PT Stop Time 0315    PT Time Calculation (min) 44 min    Equipment Utilized During Treatment Gait belt    Activity Tolerance Patient tolerated treatment well    Behavior During Therapy WFL for tasks assessed/performed           Past Medical History:  Diagnosis Date  . Allergic rhinitis, cause unspecified   . Cramp of limb   . Diverticulosis   . Epistaxis   . GERD (gastroesophageal reflux disease)   . HTN (hypertension)   . Osteopenia   . Other and unspecified hyperlipidemia   . Other malaise and fatigue   . Rheumatoid arthritis(714.0)   . Urine incontinence     Past Surgical History:  Procedure Laterality Date  . BREAST BIOPSY  2/11   fibrocystic change  . BREAST BIOPSY  8/11   fibrocystic change  . COLONOSCOPY  2003  . CYSTOSCOPY  2006  . MOLE REMOVAL     rectal  . TONSILLECTOMY      There were no vitals filed for this visit.   Subjective Assessment - 07/17/19 1436    Subjective Patient is doing well.    Pertinent History Patient is 86 yo female that  was referred to PT for strength and balance. PMH of fall abut a year ago and pelvic fx and R hip fx, GERD, HTN, OA, osteopenia, RA. Pt is husbands caregiver, performs all household tasks, independent (will walk around house without [facility]). Pt does have a history of visual issues. Patient reported that she falls, especially with head turns, tends to fall backwards. Pt does complain of dizziness.    Limitations  Standing;Walking;House hold activities    How long can you sit comfortably? NA    How long can you walk comfortably? 49mns (guesstimate)    Diagnostic tests NA    Patient Stated Goals not fall, walk better    Currently in Pain? No/denies    Multiple Pain Sites No           Treatment: Neuromuscular Training: Stool: staggered stance, head turns side/side, up/down x 5 reps each, each foot in front; VCs for proper technique and positioning for each exercise staggered stance, trunk rotation with 2 lb rod, VC to keep UE straight and turn head with trunk  Hurdle: Forward and backward stepping over hurdle x15 each direction, VCs to take big enough steps and to try to increase speed to work on coordination  Side stepping over hurdle 15x each direction  Blue Foam: Side stepping x10 on blue balance CGA for safety, VCs for taking a big enough step  Airex pad trunk rotation x2 min, CGA for safety, demonstrated difficulty with keeping arms extended and full rotation with head turn Airex pad, balloon tapping to mirror x2 min, supervision for safety with varying directions and speed of balloon, VCs for utilizing both hands and minimizing UE support  BOSU: Lunge to BOSU ball x  10 , cues for going slow and to control the speed  Floor Star exercise: Performed stepping on the star diagram on the floor. Working on weight-shifting forward onto the foot that stepped forward and then weight-shifting back and bringing her feet back together with CGA. Performed 2 reps each foot.   Matrix: Fwd/bwd gait with 22. 5 lbs and CGA, cues for posture and stepping strategies, occasional LOB Side stepping left and right and CGA with cues to slow movement     Pt educated throughout session about proper posture and technique with exercises. Improved exercise technique, movement at target joints, use of target muscles after min to mod verbal, visual, tactile cues. CGA and Min to mod verbal cues used throughout with  increased in postural sway and LOB most seen with narrow base of support and while on uneven surfaces. Continues to have balance deficits typical with diagnosis. Patient performs intermediate level exercises without pain behaviors and needs verbal cuing for postural alignment and head positioning                          PT Education - 07/17/19 1455    Education Details safety    Person(s) Educated Patient    Methods Explanation    Comprehension Verbalized understanding;Returned demonstration;Verbal cues required;Tactile cues required            PT Short Term Goals - 05/16/19 1429      PT SHORT TERM GOAL #1   Title Pt will be independent with initial HEP in order to improve strength and balance in order to decrease fall risk and improve function at home and work.    Baseline administered on eval 4/21    Time 4    Period Weeks    Status New    Target Date 06/13/19             PT Long Term Goals - 07/05/19 1452      PT LONG TERM GOAL #1   Title Pt will be independent with HEP in order to improve strength and balance in order to decrease fall risk and improve function at home and work.    Time 8    Period Weeks    Status Partially Met    Target Date 09/04/19      PT LONG TERM GOAL #2   Title Pt will improve DGI by at least 6 points in order to demonstrate significant improvement in balance and decreased risk for falls.    Baseline 6/24, 06/21/19=12/24    Time 8    Period Weeks    Status Partially Met    Target Date 09/04/19      PT LONG TERM GOAL #3   Title Pt will improve BERG by at least 3 points in order to demonstrate clinically significant improvement in balance.    Baseline 34/56,  5/27= 41/56    Time 8    Period Weeks    Status Partially Met    Target Date 09/04/19      PT LONG TERM GOAL #4   Title Pt will decrease 5TSTS by at least 3 seconds and no UE support in order to demonstrate clinically significant improvement in LE strength.     Baseline 17 seconds, bilateral hand support, 06/21/19= 15 sec    Time 8    Period Weeks    Status Partially Met    Target Date 09/04/19      PT LONG TERM GOAL #  5   Title Pt will improve 10 MWT to at least 1 m/s to indicate improved community ambulation and safety.    Baseline .43 ms/ (quick pace) 06/21/19= .53 m/sec    Time 8    Status Partially Met    Target Date 09/04/19                 Plan - 07/17/19 1437    Clinical Impression Statement Patient instructed in intermediate balance/strengthening exercise. Patient fatigues quickly requiring short rest breaks in between intermediate exercise. Patient instructed in advanced strengthening with increased repetition/resistance. Patient requires CGA for intermediate balance exercise especially with less rail assist. Patient would benefit from additional skilled PT intervention to improve balance/gait safety and reduce fall risk.   Personal Factors and Comorbidities Age;Comorbidity 3+    Comorbidities GERD, previous falls, HTN, OA, RA    Examination-Activity Limitations Squat;Stairs;Lift;Bed Mobility;Stand;Reach Overhead;Dressing;Carry;Caring for Others;Transfers    Examination-Participation Restrictions Laundry;Shop;Cleaning;Medication Management;Community Activity;Driving;Yard Work;Meal Prep    Stability/Clinical Decision Making Stable/Uncomplicated    Rehab Potential Good    PT Frequency 2x / week    PT Duration 8 weeks    PT Treatment/Interventions ADLs/Self Care Home Management;Canalith Repostioning;Cryotherapy;Ultrasound;Traction;Moist Heat;Electrical Stimulation;DME Instruction;Gait training;Stair training;Balance training;Therapeutic exercise;Therapeutic activities;Functional mobility training;Neuromuscular re-education;Patient/family education;Passive range of motion;Energy conservation;Vestibular;Taping;Splinting;Spinal Manipulations;Joint Manipulations    PT Next Visit Plan address balance/strength/ambulation    PT Home Exercise  Plan [email or web address]    Consulted and Agree with Plan of Care Patient           Patient will benefit from skilled therapeutic intervention in order to improve the following deficits and impairments:  Abnormal gait, Decreased balance, Decreased endurance, Decreased mobility, Difficulty walking, Decreased activity tolerance, Decreased strength, Postural dysfunction  Visit Diagnosis: Other abnormalities of gait and mobility  Muscle weakness (generalized)     Problem List Patient Active Problem List   Diagnosis Date Noted  . Lump of left thigh 05/07/2019  . Poor balance 05/07/2019  . Vestibular dizziness 03/31/2018  . Hip fracture, right ([place]) 02/27/2018  . Pedal edema 09/28/2017  . Fall at home 09/28/2017  . Prediabetes 11/30/2016  . Toenail fungus 10/03/2015  . At moderate risk for fall 09/05/2015  . Cervical spondylosis without myelopathy 08/05/2015  . Routine general medical examination at a health care facility 08/27/2014  . Colon cancer screening 08/27/2014  . Fall against object 08/15/2014  . Encounter for Medicare annual wellness exam 07/25/2013  . Varicosities of leg 05/23/2012  . Abnormal ultrasound of thyroid gland 03/01/2011  . Hyperlipidemia 06/11/2008  . Allergic rhinitis 03/29/2008  . Essential hypertension, benign 12/26/2006  . GERD 12/26/2006  . Rheumatoid arthritis ([place]) 12/26/2006  . Osteoporosis 12/26/2006  . URINARY INCONTINENCE 12/26/2006    Alanson Puls, [place] DPT 07/17/2019, 2:56 PM  [place] MAIN [facility] SERVICES [address] [place], [place], [address] Phone: [phone]   Fax:  [phone]  Name: Sierra Bentley MRN: [id number] Date of Birth: 09-08-1933

## 2019-07-19 ENCOUNTER — Ambulatory Visit: Payer: Medicare PPO | Admitting: Physical Therapy

## 2019-07-19 ENCOUNTER — Encounter: Payer: Self-pay | Admitting: Physical Therapy

## 2019-07-19 ENCOUNTER — Other Ambulatory Visit: Payer: Self-pay

## 2019-07-19 DIAGNOSIS — R2689 Other abnormalities of gait and mobility: Secondary | ICD-10-CM

## 2019-07-19 DIAGNOSIS — M6281 Muscle weakness (generalized): Secondary | ICD-10-CM

## 2019-07-19 NOTE — Therapy (Signed)
[place] MAIN [facility] SERVICES [address] [place], [place], [address] Phone: [phone]   Fax:  [phone]  Physical Therapy Treatment  Patient Details  Name: Sierra Bentley MRN: [id number] Date of Birth: 05/20/1933 No data recorded  Encounter Date: 07/19/2019   PT End of Session - 07/19/19 1439    Visit Number 16    Number of Visits 16    Date for PT Re-Evaluation 09/04/19    Authorization Type Medicare, eval 0/31/5945, cert 8/59/2924    PT Start Time 0235    PT Stop Time 0315    PT Time Calculation (min) 40 min    Equipment Utilized During Treatment Gait belt    Activity Tolerance Patient tolerated treatment well    Behavior During Therapy WFL for tasks assessed/performed           Past Medical History:  Diagnosis Date  . Allergic rhinitis, cause unspecified   . Cramp of limb   . Diverticulosis   . Epistaxis   . GERD (gastroesophageal reflux disease)   . HTN (hypertension)   . Osteopenia   . Other and unspecified hyperlipidemia   . Other malaise and fatigue   . Rheumatoid arthritis(714.0)   . Urine incontinence     Past Surgical History:  Procedure Laterality Date  . BREAST BIOPSY  2/11   fibrocystic change  . BREAST BIOPSY  8/11   fibrocystic change  . COLONOSCOPY  2003  . CYSTOSCOPY  2006  . MOLE REMOVAL     rectal  . TONSILLECTOMY      There were no vitals filed for this visit.   Subjective Assessment - 07/19/19 1438    Subjective Patient is doing well.    Pertinent History Patient is 86 yo female that  was referred to PT for strength and balance. PMH of fall abut a year ago and pelvic fx and R hip fx, GERD, HTN, OA, osteopenia, RA. Pt is husbands caregiver, performs all household tasks, independent (will walk around house without [facility]). Pt does have a history of visual issues. Patient reported that she falls, especially with head turns, tends to fall backwards. Pt does complain of dizziness.    Limitations  Standing;Walking;House hold activities    How long can you sit comfortably? NA    How long can you walk comfortably? 54mns (guesstimate)    Diagnostic tests NA    Patient Stated Goals not fall, walk better    Currently in Pain? No/denies    Multiple Pain Sites No           Ther-ex  Octane fitness  x 5 mins    Squats x 15 with cues for correct posture  Heel raises x 15 x 2 sets  Step ups to 6-inch stool x 20   Eccentric step downs from 3 inch stool x 10 verbal cues to complete slow and tap heel.  BUE CGA  Stool: staggered stance, head turns side/side, up/down x 5 reps each, each foot in front; VCs for proper technique and positioning for each exercise staggered stance, trunk rotation with theraball VC to keep UE straight and turn head with trunk  1/2 foam: Lateral weight shift x10 reps each direction, no UE support, CGA for safety with VCs to tap in each direction, controlling the speed Anterior/Posterior weight shift x10 reps, no UE support, CGA for safety with VCs to utilize ankles to shift weight over toes and back to heels  Hurdle: Forward and backward stepping  over hurdle x15 each direction, VCs to take big enough steps and to try to increase speed to work on coordination  Side stepping over hurdle 15x each direction  Blue Foam: Side stepping x10 on blue balance CGA for safety, VCs for taking a big enough step  Airex pad trunk rotation x2 min, CGA for safety, demonstrated difficulty with keeping arms extended and full rotation with head turn  BOSU: Lunge to BOSU ball x 10 , cues for going slow and to control the speed   Pt educated throughout session about proper posture and technique with exercises. Improved exercise technique, movement at target joints, use of target muscles after min to mod verbal, visual, tactile cues. CGA and Min to mod verbal cues used throughout with increased in postural sway and LOB most seen with narrow base of support and while on uneven  surfaces. Continues to have balance deficits typical with diagnosis. Patient performs intermediate level exercises without pain behaviors and needs verbal cuing for postural alignment and head positioning  encourage fatigue.                           PT Education - 07/19/19 1438    Education Details HEP    Person(s) Educated Patient    Methods Explanation    Comprehension Returned demonstration;Verbalized understanding;Verbal cues required;Need further instruction;Tactile cues required            PT Short Term Goals - 05/16/19 1429      PT SHORT TERM GOAL #1   Title Pt will be independent with initial HEP in order to improve strength and balance in order to decrease fall risk and improve function at home and work.    Baseline administered on eval 4/21    Time 4    Period Weeks    Status New    Target Date 06/13/19             PT Long Term Goals - 07/05/19 1452      PT LONG TERM GOAL #1   Title Pt will be independent with HEP in order to improve strength and balance in order to decrease fall risk and improve function at home and work.    Time 8    Period Weeks    Status Partially Met    Target Date 09/04/19      PT LONG TERM GOAL #2   Title Pt will improve DGI by at least 6 points in order to demonstrate significant improvement in balance and decreased risk for falls.    Baseline 6/24, 06/21/19=12/24    Time 8    Period Weeks    Status Partially Met    Target Date 09/04/19      PT LONG TERM GOAL #3   Title Pt will improve BERG by at least 3 points in order to demonstrate clinically significant improvement in balance.    Baseline 34/56,  5/27= 41/56    Time 8    Period Weeks    Status Partially Met    Target Date 09/04/19      PT LONG TERM GOAL #4   Title Pt will decrease 5TSTS by at least 3 seconds and no UE support in order to demonstrate clinically significant improvement in LE strength.    Baseline 17 seconds, bilateral hand support,  06/21/19= 15 sec    Time 8    Period Weeks    Status Partially Met    Target Date  09/04/19      PT LONG TERM GOAL #5   Title Pt will improve 10 MWT to at least 1 m/s to indicate improved community ambulation and safety.    Baseline .43 ms/ (quick pace) 06/21/19= .53 m/sec    Time 8    Status Partially Met    Target Date 09/04/19                 Plan - 07/19/19 1439    Clinical Impression Statement Patient demonstrating increased coordination of muscle activation with smoother movements during strengthening interventions in close chained. Decreased fatigue at end of set implicates increased strength additionally. Patient continues to be challenged by dynamic surfaces and single limb stance. Patient will continue to benefit from skilled physical therapy to improve gait mechanics, strength, and balance for return to PLOF   Personal Factors and Comorbidities Age;Comorbidity 3+    Comorbidities GERD, previous falls, HTN, OA, RA    Examination-Activity Limitations Squat;Stairs;Lift;Bed Mobility;Stand;Reach Overhead;Dressing;Carry;Caring for Others;Transfers    Examination-Participation Restrictions Laundry;Shop;Cleaning;Medication Management;Community Activity;Driving;Yard Work;Meal Prep    Stability/Clinical Decision Making Stable/Uncomplicated    Rehab Potential Good    PT Frequency 2x / week    PT Duration 8 weeks    PT Treatment/Interventions ADLs/Self Care Home Management;Canalith Repostioning;Cryotherapy;Ultrasound;Traction;Moist Heat;Electrical Stimulation;DME Instruction;Gait training;Stair training;Balance training;Therapeutic exercise;Therapeutic activities;Functional mobility training;Neuromuscular re-education;Patient/family education;Passive range of motion;Energy conservation;Vestibular;Taping;Splinting;Spinal Manipulations;Joint Manipulations    PT Next Visit Plan address balance/strength/ambulation    PT Home Exercise Plan [email or web address]    Consulted and Agree with Plan of Care  Patient           Patient will benefit from skilled therapeutic intervention in order to improve the following deficits and impairments:  Abnormal gait, Decreased balance, Decreased endurance, Decreased mobility, Difficulty walking, Decreased activity tolerance, Decreased strength, Postural dysfunction  Visit Diagnosis: Muscle weakness (generalized)  Other abnormalities of gait and mobility     Problem List Patient Active Problem List   Diagnosis Date Noted  . Lump of left thigh 05/07/2019  . Poor balance 05/07/2019  . Vestibular dizziness 03/31/2018  . Hip fracture, right ([place]) 02/27/2018  . Pedal edema 09/28/2017  . Fall at home 09/28/2017  . Prediabetes 11/30/2016  . Toenail fungus 10/03/2015  . At moderate risk for fall 09/05/2015  . Cervical spondylosis without myelopathy 08/05/2015  . Routine general medical examination at a health care facility 08/27/2014  . Colon cancer screening 08/27/2014  . Fall against object 08/15/2014  . Encounter for Medicare annual wellness exam 07/25/2013  . Varicosities of leg 05/23/2012  . Abnormal ultrasound of thyroid gland 03/01/2011  . Hyperlipidemia 06/11/2008  . Allergic rhinitis 03/29/2008  . Essential hypertension, benign 12/26/2006  . GERD 12/26/2006  . Rheumatoid arthritis ([place]) 12/26/2006  . Osteoporosis 12/26/2006  . URINARY INCONTINENCE 12/26/2006    Arelia Sneddon S,PT DPT 07/19/2019, 2:40 PM  [place] MAIN [facility] SERVICES [address] [place], [place], [address] Phone: [phone]   Fax:  [phone]  Name: LANEY LOUDERBACK MRN: [id number] Date of Birth: Apr 03, 1933

## 2019-07-24 ENCOUNTER — Ambulatory Visit: Payer: Medicare PPO | Admitting: Physical Therapy

## 2019-07-24 ENCOUNTER — Encounter: Payer: Self-pay | Admitting: Physical Therapy

## 2019-07-24 ENCOUNTER — Other Ambulatory Visit: Payer: Self-pay

## 2019-07-24 DIAGNOSIS — M6281 Muscle weakness (generalized): Secondary | ICD-10-CM

## 2019-07-24 DIAGNOSIS — R2689 Other abnormalities of gait and mobility: Secondary | ICD-10-CM | POA: Diagnosis not present

## 2019-07-24 NOTE — Therapy (Signed)
[place] MAIN [facility] SERVICES [address] [place], [place], [address] Phone: [phone]   Fax:  [phone]  Physical Therapy Treatment  Patient Details  Name: Sierra Bentley MRN: [id number] Date of Birth: 05-28-1933 No data recorded  Encounter Date: 07/24/2019   PT End of Session - 07/24/19 1443    Visit Number 17    Number of Visits 16    Date for PT Re-Evaluation 09/04/19    Authorization Type Medicare, eval 1/93/7902, cert 05/04/7351    PT Start Time 0232    PT Stop Time 0315    PT Time Calculation (min) 43 min    Equipment Utilized During Treatment Gait belt    Activity Tolerance Patient tolerated treatment well    Behavior During Therapy WFL for tasks assessed/performed           Past Medical History:  Diagnosis Date  . Allergic rhinitis, cause unspecified   . Cramp of limb   . Diverticulosis   . Epistaxis   . GERD (gastroesophageal reflux disease)   . HTN (hypertension)   . Osteopenia   . Other and unspecified hyperlipidemia   . Other malaise and fatigue   . Rheumatoid arthritis(714.0)   . Urine incontinence     Past Surgical History:  Procedure Laterality Date  . BREAST BIOPSY  2/11   fibrocystic change  . BREAST BIOPSY  8/11   fibrocystic change  . COLONOSCOPY  2003  . CYSTOSCOPY  2006  . MOLE REMOVAL     rectal  . TONSILLECTOMY      There were no vitals filed for this visit.   Subjective Assessment - 07/24/19 1442    Subjective Patient is doing well.    Pertinent History Patient is 86 yo female that  was referred to PT for strength and balance. PMH of fall abut a year ago and pelvic fx and R hip fx, GERD, HTN, OA, osteopenia, RA. Pt is husbands caregiver, performs all household tasks, independent (will walk around house without [facility]). Pt does have a history of visual issues. Patient reported that she falls, especially with head turns, tends to fall backwards. Pt does complain of dizziness.    Limitations  Standing;Walking;House hold activities    How long can you sit comfortably? NA    How long can you walk comfortably? 86mns (guesstimate)    Diagnostic tests NA    Patient Stated Goals not fall, walk better    Currently in Pain? No/denies             Treatment: Octane fitness x 5 mins L 2 Neuromuscular Training: Stool: staggered stance, head turns side/side, up/down x 5 reps each, each foot in front; VCs for proper technique and positioning for each exercise staggered stance, trunk rotation with 2 lb rod, VC to keep UE straight and turn head with trunk  1/2 foam : Lateral weight shift x10 reps each direction, no UE support, CGA for safety with VCs to tap in each direction, controlling the speed Anterior/Posterior weight shift x10 reps, no UE support, CGA for safety with VCs to utilize ankles to shift weight over toes and back to heels  Hurdle: Forward and backward stepping over hurdle x15 each direction, VCs to take big enough steps and to try to increase speed to work on coordination  Side stepping over hurdle 15x each direction  Matrix ; Fwd/bwd 7.5 lbs , CGA Side to side 7.5 lbs, CGA   BOSU: Lunge to BOSU ball  x 10 , cues for going slow and to control the speed      Pt educated throughout session about proper posture and technique with exercises. Improved exercise technique, movement at target joints, use of target muscles after min to mod verbal, visual, tactile cues. CGA and Min to mod verbal cues used throughout with increased in postural sway and LOB most seen with narrow base of support and while on uneven surfaces.                       PT Education - 07/24/19 1443    Education Details HEP    Person(s) Educated Patient    Methods Explanation    Comprehension Verbalized understanding;Returned demonstration;Verbal cues required;Need further instruction            PT Short Term Goals - 05/16/19 1429      PT SHORT TERM GOAL #1   Title Pt  will be independent with initial HEP in order to improve strength and balance in order to decrease fall risk and improve function at home and work.    Baseline administered on eval 4/21    Time 4    Period Weeks    Status New    Target Date 06/13/19             PT Long Term Goals - 07/05/19 1452      PT LONG TERM GOAL #1   Title Pt will be independent with HEP in order to improve strength and balance in order to decrease fall risk and improve function at home and work.    Time 8    Period Weeks    Status Partially Met    Target Date 09/04/19      PT LONG TERM GOAL #2   Title Pt will improve DGI by at least 6 points in order to demonstrate significant improvement in balance and decreased risk for falls.    Baseline 6/24, 06/21/19=12/24    Time 8    Period Weeks    Status Partially Met    Target Date 09/04/19      PT LONG TERM GOAL #3   Title Pt will improve BERG by at least 3 points in order to demonstrate clinically significant improvement in balance.    Baseline 34/56,  5/27= 41/56    Time 8    Period Weeks    Status Partially Met    Target Date 09/04/19      PT LONG TERM GOAL #4   Title Pt will decrease 5TSTS by at least 3 seconds and no UE support in order to demonstrate clinically significant improvement in LE strength.    Baseline 17 seconds, bilateral hand support, 06/21/19= 15 sec    Time 8    Period Weeks    Status Partially Met    Target Date 09/04/19      PT LONG TERM GOAL #5   Title Pt will improve 10 MWT to at least 1 m/s to indicate improved community ambulation and safety.    Baseline .43 ms/ (quick pace) 06/21/19= .53 m/sec    Time 8    Status Partially Met    Target Date 09/04/19                 Plan - 07/24/19 1444    Clinical Impression Statement Pt requires direction and verbal cues for correct performance of exercise and activities. Patient demonstrates difficulties strengthening against gravity and closed chain activities and  has no  reports of pain.. Pt was able to perform all exercises with min assist and VC for technique.   Patient struggles with fatigue and repetitions.  Pt encouraged continuing HEP. Follow-up as scheduled.   Personal Factors and Comorbidities Age;Comorbidity 3+    Comorbidities GERD, previous falls, HTN, OA, RA    Examination-Activity Limitations Squat;Stairs;Lift;Bed Mobility;Stand;Reach Overhead;Dressing;Carry;Caring for Others;Transfers    Examination-Participation Restrictions Laundry;Shop;Cleaning;Medication Management;Community Activity;Driving;Yard Work;Meal Prep    Stability/Clinical Decision Making Stable/Uncomplicated    Rehab Potential Good    PT Frequency 2x / week    PT Duration 8 weeks    PT Treatment/Interventions ADLs/Self Care Home Management;Canalith Repostioning;Cryotherapy;Ultrasound;Traction;Moist Heat;Electrical Stimulation;DME Instruction;Gait training;Stair training;Balance training;Therapeutic exercise;Therapeutic activities;Functional mobility training;Neuromuscular re-education;Patient/family education;Passive range of motion;Energy conservation;Vestibular;Taping;Splinting;Spinal Manipulations;Joint Manipulations    PT Next Visit Plan address balance/strength/ambulation    PT Home Exercise Plan [email or web address]    Consulted and Agree with Plan of Care Patient           Patient will benefit from skilled therapeutic intervention in order to improve the following deficits and impairments:  Abnormal gait, Decreased balance, Decreased endurance, Decreased mobility, Difficulty walking, Decreased activity tolerance, Decreased strength, Postural dysfunction  Visit Diagnosis: Muscle weakness (generalized)  Other abnormalities of gait and mobility     Problem List Patient Active Problem List   Diagnosis Date Noted  . Lump of left thigh 05/07/2019  . Poor balance 05/07/2019  . Vestibular dizziness 03/31/2018  . Hip fracture, right ([place]) 02/27/2018  . Pedal edema 09/28/2017  . Fall  at home 09/28/2017  . Prediabetes 11/30/2016  . Toenail fungus 10/03/2015  . At moderate risk for fall 09/05/2015  . Cervical spondylosis without myelopathy 08/05/2015  . Routine general medical examination at a health care facility 08/27/2014  . Colon cancer screening 08/27/2014  . Fall against object 08/15/2014  . Encounter for Medicare annual wellness exam 07/25/2013  . Varicosities of leg 05/23/2012  . Abnormal ultrasound of thyroid gland 03/01/2011  . Hyperlipidemia 06/11/2008  . Allergic rhinitis 03/29/2008  . Essential hypertension, benign 12/26/2006  . GERD 12/26/2006  . Rheumatoid arthritis ([place]) 12/26/2006  . Osteoporosis 12/26/2006  . URINARY INCONTINENCE 12/26/2006    Alanson Puls, [place] DPT 07/24/2019, 2:45 PM , PT DPT [place] MAIN [facility] SERVICES [address] [place], [place], [address] Phone: [phone]   Fax:  [phone]  Name: Sierra Bentley MRN: [id number] Date of Birth: Nov 16, 1933

## 2019-07-25 ENCOUNTER — Other Ambulatory Visit: Payer: Self-pay

## 2019-07-25 ENCOUNTER — Ambulatory Visit: Payer: Medicare PPO | Admitting: Dermatology

## 2019-07-25 DIAGNOSIS — D1724 Benign lipomatous neoplasm of skin and subcutaneous tissue of left leg: Secondary | ICD-10-CM

## 2019-07-25 NOTE — Patient Instructions (Signed)
Pre-Operative Instructions  You are scheduled for a surgical procedure at [facility]. We recommend you read the following instructions. If you have any questions or concerns, please call the office at [phone].  1. Shower and wash the entire body with soap and water the day of your surgery paying special attention to cleansing at and around the planned surgery site.  2. Avoid aspirin or aspirin containing products at least fourteen (14) days prior to your surgical procedure and for at least one week (7 Days) after your surgical procedure. If you take aspirin on a regular basis for heart disease or history of stroke or for any other reason, we may recommend you continue taking aspirin but please notify us if you take this on a regular basis. Aspirin can cause more bleeding to occur during surgery as well as prolonged bleeding and bruising after surgery.   3. Avoid other nonsteroidal pain medications at least one week prior to surgery and at least one week prior to your surgery. These include medications such as Ibuprofen (Motrin, Advil and Nuprin), Naprosyn, Voltaren, Relafen, etc. If medications are used for therapeutic reasons, please inform us as they can cause increased bleeding or prolonged bleeding during and bruising after surgical procedures.   4. Please advice us if you are taking any "blood thinner" medications such as Coumadin or Dipyridamole or Plavix or similar medications. These cause increased bleeding and prolonged bleeding during and bruising after surgical procedures. We may have to consider discontinuing these medications briefly prior to and shortly after your surgery, if safe to do so.   5. Please inform us of all medications you are currently taking. All medications that are taken regularly should be taken the day of surgery as you always do. Nevertheless, we need to be informed of what medications you are taking prior to surgery to whether they will affect the  procedure or cause any complications.   6. Please inform us of any medication allergies. Also inform us of whether you have allergies to Latex or rubber products or whether you have had any adverse reaction to Lidocaine or Epinephrine.  7. Please inform us of any prosthetic or artificial body parts such as artificial heart valve, joint replacements, etc., or similar condition that might require preoperative antibiotics.   8. We recommend avoidance of alcohol at least two weeks prior to surgery and continued avoidence for at least two weeks after surgery.   9. We recommend discontinuation of tobacco smoking at least two weeks prior to surgery and continued abstinence for at least two weeks after surgery.  10. Do not plan strenuous exercise, strenuous work or strenuous lifting for approximately four weeks after your surgery.   11. We request if you are unable to make your scheduled surgical appointment, please call us at least a week in advance or as soon as you are aware of a problem sot aht we can cancel or reschedule you.   12. You MAKE TAKE TYLENOL (acetaminophen) for pain as it is not a blood thinner.   13. PLEASE PLAN TO BE IN TOWN FOR TWO WEEKS FOLLOWING SURGERY, THIS IS IMPORTANT SO YOU CAN BE CHECKED FOR DRESSING CHANGES, SUTURE REMOVAL AND TO MONITOR FOR POSSIBLE COMPLICATIONS.   

## 2019-07-25 NOTE — Progress Notes (Signed)
   New Patient Visit  Subjective  Sierra Bentley is a 86 y.o. female who presents for the following: mass (of the left thigh - not painful or bothersome but patient has noticed it's growing larger. She is unsure of the exact amount of time it's been there but she knows for sure at least 6 months).  The following portions of the chart were reviewed this encounter and updated as appropriate:  Tobacco  Allergies  Meds  Problems  Med Hx  Surg Hx  Fam Hx     Review of Systems:  No other skin or systemic complaints except as noted in HPI or Assessment and Plan.  Objective  Well appearing patient in no apparent distress; mood and affect are within normal limits.  A focused examination was performed including the left thigh. Relevant physical exam findings are noted in the Assessment and Plan.  Objective  L prox ant lat thigh: 4.0 cm fatty SQ nodule   Assessment & Plan    Lipoma of left lower extremity with history of change L prox ant lat thigh  Discussed typically benign nature with patient and surgical option. Recommend excision since mass has grown in size.  Return for surgery - lipoma L thigh.   Luther Redo, CMA, am acting as scribe for Sarina Ser, MD .  Documentation: I have reviewed the above documentation for accuracy and completeness, and I agree with the above.  Sarina Ser, MD

## 2019-07-26 ENCOUNTER — Ambulatory Visit: Payer: Medicare PPO | Attending: Family Medicine | Admitting: Physical Therapy

## 2019-07-26 ENCOUNTER — Encounter: Payer: Self-pay | Admitting: Physical Therapy

## 2019-07-26 DIAGNOSIS — R2689 Other abnormalities of gait and mobility: Secondary | ICD-10-CM | POA: Diagnosis not present

## 2019-07-26 DIAGNOSIS — M6281 Muscle weakness (generalized): Secondary | ICD-10-CM

## 2019-07-26 NOTE — Therapy (Signed)
[place] MAIN [facility] SERVICES [address] [place], [place], [address] Phone: [phone]   Fax:  [phone]  Physical Therapy Treatment  Patient Details  Name: Sierra Bentley MRN: [id number] Date of Birth: 30-Apr-1933 No data recorded  Encounter Date: 07/26/2019   PT End of Session - 07/26/19 1452    Visit Number 18    Number of Visits 16    Date for PT Re-Evaluation 09/04/19    Authorization Type Medicare, eval 9/73/5329, cert 10/18/2681    PT Start Time 0245    PT Stop Time 0315    PT Time Calculation (min) 30 min    Equipment Utilized During Treatment Gait belt    Activity Tolerance Patient tolerated treatment well    Behavior During Therapy WFL for tasks assessed/performed           Past Medical History:  Diagnosis Date  . Allergic rhinitis, cause unspecified   . Cramp of limb   . Diverticulosis   . Epistaxis   . GERD (gastroesophageal reflux disease)   . HTN (hypertension)   . Osteopenia   . Other and unspecified hyperlipidemia   . Other malaise and fatigue   . Rheumatoid arthritis(714.0)   . Urine incontinence     Past Surgical History:  Procedure Laterality Date  . BREAST BIOPSY  2/11   fibrocystic change  . BREAST BIOPSY  8/11   fibrocystic change  . COLONOSCOPY  2003  . CYSTOSCOPY  2006  . MOLE REMOVAL     rectal  . TONSILLECTOMY      There were no vitals filed for this visit.   Subjective Assessment - 07/26/19 1451    Subjective Patient is doing well.    Pertinent History Patient is 86 yo female that  was referred to PT for strength and balance. PMH of fall abut a year ago and pelvic fx and R hip fx, GERD, HTN, OA, osteopenia, RA. Pt is husbands caregiver, performs all household tasks, independent (will walk around house without [facility]). Pt does have a history of visual issues. Patient reported that she falls, especially with head turns, tends to fall backwards. Pt does complain of dizziness.    Limitations  Standing;Walking;House hold activities    How long can you sit comfortably? NA    How long can you walk comfortably? 33mns (guesstimate)    Diagnostic tests NA    Patient Stated Goals not fall, walk better    Currently in Pain? No/denies    Multiple Pain Sites No              Treatment: Neuromuscular Training:  Foam to bosu ball tapping x 20  Hurdle: Forward and backward stepping over hurdle x15 each direction, VCs to take big enough steps and to try to increase speed to work on coordination  Side stepping over hurdle 15x each direction  Blue Foam: Side stepping x10 on blue balance CGA for safety, VCs for taking a big enough step  Airex pad trunk rotation x2 min, CGA for safety, demonstrated difficulty with keeping arms extended and full rotation with head turn  BOSU: Lunge to BOSU ball x 10 , cues for going slow and to control the speed   Pt educated throughout session about proper posture and technique with exercises. Improved exercise technique, movement at target joints, use of target muscles after min to mod verbal, visual, tactile cues. CGA and Min to mod verbal cues used throughout with increased in postural sway and  LOB most seen with narrow base of support and while on uneven surfaces.                        PT Education - 07/26/19 1452    Education Details HEP    Person(s) Educated Patient    Methods Explanation    Comprehension Verbalized understanding            PT Short Term Goals - 05/16/19 1429      PT SHORT TERM GOAL #1   Title Pt will be independent with initial HEP in order to improve strength and balance in order to decrease fall risk and improve function at home and work.    Baseline administered on eval 4/21    Time 4    Period Weeks    Status New    Target Date 06/13/19             PT Long Term Goals - 07/05/19 1452      PT LONG TERM GOAL #1   Title Pt will be independent with HEP in order to improve strength and  balance in order to decrease fall risk and improve function at home and work.    Time 8    Period Weeks    Status Partially Met    Target Date 09/04/19      PT LONG TERM GOAL #2   Title Pt will improve DGI by at least 6 points in order to demonstrate significant improvement in balance and decreased risk for falls.    Baseline 6/24, 06/21/19=12/24    Time 8    Period Weeks    Status Partially Met    Target Date 09/04/19      PT LONG TERM GOAL #3   Title Pt will improve BERG by at least 3 points in order to demonstrate clinically significant improvement in balance.    Baseline 34/56,  5/27= 41/56    Time 8    Period Weeks    Status Partially Met    Target Date 09/04/19      PT LONG TERM GOAL #4   Title Pt will decrease 5TSTS by at least 3 seconds and no UE support in order to demonstrate clinically significant improvement in LE strength.    Baseline 17 seconds, bilateral hand support, 06/21/19= 15 sec    Time 8    Period Weeks    Status Partially Met    Target Date 09/04/19      PT LONG TERM GOAL #5   Title Pt will improve 10 MWT to at least 1 m/s to indicate improved community ambulation and safety.    Baseline .43 ms/ (quick pace) 06/21/19= .53 m/sec    Time 8    Status Partially Met    Target Date 09/04/19                 Plan - 07/26/19 1456    Clinical Impression Statement Pt has continued to show improvements in LE strength, mobility  Pt was able to progress exercises today to further strengthen LE and core musculature Pt would continue to benefit from skilled services to improve safety and decrease falls risk.   Personal Factors and Comorbidities Age;Comorbidity 3+    Comorbidities GERD, previous falls, HTN, OA, RA    Examination-Activity Limitations Squat;Stairs;Lift;Bed Mobility;Stand;Reach Overhead;Dressing;Carry;Caring for Others;Transfers    Examination-Participation Restrictions Laundry;Shop;Cleaning;Medication Management;Community Activity;Driving;Yard  Work;Meal Prep    Stability/Clinical Decision Making Stable/Uncomplicated    Rehab  Potential Good    PT Frequency 2x / week    PT Duration 8 weeks    PT Treatment/Interventions ADLs/Self Care Home Management;Canalith Repostioning;Cryotherapy;Ultrasound;Traction;Moist Heat;Electrical Stimulation;DME Instruction;Gait training;Stair training;Balance training;Therapeutic exercise;Therapeutic activities;Functional mobility training;Neuromuscular re-education;Patient/family education;Passive range of motion;Energy conservation;Vestibular;Taping;Splinting;Spinal Manipulations;Joint Manipulations    PT Next Visit Plan address balance/strength/ambulation    PT Home Exercise Plan [email or web address]    Consulted and Agree with Plan of Care Patient           Patient will benefit from skilled therapeutic intervention in order to improve the following deficits and impairments:  Abnormal gait, Decreased balance, Decreased endurance, Decreased mobility, Difficulty walking, Decreased activity tolerance, Decreased strength, Postural dysfunction  Visit Diagnosis: Muscle weakness (generalized)  Other abnormalities of gait and mobility     Problem List Patient Active Problem List   Diagnosis Date Noted  . Lump of left thigh 05/07/2019  . Poor balance 05/07/2019  . Vestibular dizziness 03/31/2018  . Hip fracture, right ([place]) 02/27/2018  . Pedal edema 09/28/2017  . Fall at home 09/28/2017  . Prediabetes 11/30/2016  . Toenail fungus 10/03/2015  . At moderate risk for fall 09/05/2015  . Cervical spondylosis without myelopathy 08/05/2015  . Routine general medical examination at a health care facility 08/27/2014  . Colon cancer screening 08/27/2014  . Fall against object 08/15/2014  . Encounter for Medicare annual wellness exam 07/25/2013  . Varicosities of leg 05/23/2012  . Abnormal ultrasound of thyroid gland 03/01/2011  . Hyperlipidemia 06/11/2008  . Allergic rhinitis 03/29/2008  . Essential  hypertension, benign 12/26/2006  . GERD 12/26/2006  . Rheumatoid arthritis ([place]) 12/26/2006  . Osteoporosis 12/26/2006  . URINARY INCONTINENCE 12/26/2006    Alanson Puls, [place] DPT 07/26/2019, 2:57 PM  [place] MAIN [facility] SERVICES [address] [place], [place], [address] Phone: [phone]   Fax:  [phone]  Name: Sierra Bentley MRN: [id number] Date of Birth: 09-Dec-1933

## 2019-07-31 ENCOUNTER — Ambulatory Visit: Payer: Medicare PPO | Admitting: Physical Therapy

## 2019-08-02 ENCOUNTER — Other Ambulatory Visit: Payer: Self-pay

## 2019-08-02 ENCOUNTER — Ambulatory Visit: Payer: Medicare PPO

## 2019-08-02 DIAGNOSIS — R2689 Other abnormalities of gait and mobility: Secondary | ICD-10-CM

## 2019-08-02 DIAGNOSIS — M6281 Muscle weakness (generalized): Secondary | ICD-10-CM | POA: Diagnosis not present

## 2019-08-02 NOTE — Therapy (Signed)
[place] MAIN [facility] SERVICES [address] [place], [place], [address] Phone: [phone]   Fax:  [phone]  Physical Therapy Treatment  Patient Details  Name: Sierra Bentley MRN: [id number] Date of Birth: 24-May-1933 No data recorded  Encounter Date: 08/02/2019   PT End of Session - 08/02/19 1400    Visit Number 19    Number of Visits 16    Date for PT Re-Evaluation 09/04/19    Authorization Type Medicare, eval 05/11/3843, cert 3/64/6803    PT Start Time 1355    PT Stop Time 1430    PT Time Calculation (min) 35 min    Equipment Utilized During Treatment Gait belt    Activity Tolerance Patient tolerated treatment well    Behavior During Therapy WFL for tasks assessed/performed           Past Medical History:  Diagnosis Date   Allergic rhinitis, cause unspecified    Cramp of limb    Diverticulosis    Epistaxis    GERD (gastroesophageal reflux disease)    HTN (hypertension)    Osteopenia    Other and unspecified hyperlipidemia    Other malaise and fatigue    Rheumatoid arthritis(714.0)    Urine incontinence     Past Surgical History:  Procedure Laterality Date   BREAST BIOPSY  2/11   fibrocystic change   BREAST BIOPSY  8/11   fibrocystic change   COLONOSCOPY  2003   CYSTOSCOPY  2006   MOLE REMOVAL     rectal   TONSILLECTOMY      There were no vitals filed for this visit.   Subjective Assessment - 08/02/19 1358    Subjective Pt reports doign well today. Has been very busy with medical appointments between hers and hunsband's. No medical updates this date.    Pertinent History Patient is 86 yo female that  was referred to PT for strength and balance. PMH of fall abut a year ago and pelvic fx and R hip fx, GERD, HTN, OA, osteopenia, RA. Pt is husbands caregiver, performs all household tasks, independent (will walk around house without [facility]). Pt does have a history of visual issues. Patient reported that  she falls, especially with head turns, tends to fall backwards. Pt does complain of dizziness.    Limitations Standing;Walking;House hold activities    Currently in Pain? No/denies           INTERVENTION THIS DATE: -STS from chair + airex foam hands free 2x10  -seated brace marching 1x20 (appears not difficult)   -airex foam stance eyes closed to failure x10, minGuard assist; Initially some delay in awareness of posterior careen, with improvement over time -airex stance vertical head turns, unsupported x20 (3 LOB)  -airex stance horizontal head turns, unsupported x20 (1 LOB)   -lateral side stepping c repeated cone taps (10 cones) 2x10 bilat   -lateral side step with cone kick (unsupported) 1x5 bilat   -plastic step of aerobics --> lateral step up and over x10, unsupported     PT Short Term Goals - 05/16/19 1429      PT SHORT TERM GOAL #1   Title Pt will be independent with initial HEP in order to improve strength and balance in order to decrease fall risk and improve function at home and work.    Baseline administered on eval 4/21    Time 4    Period Weeks    Status New    Target Date 06/13/19  PT Long Term Goals - 07/05/19 1452      PT LONG TERM GOAL #1   Title Pt will be independent with HEP in order to improve strength and balance in order to decrease fall risk and improve function at home and work.    Time 8    Period Weeks    Status Partially Met    Target Date 09/04/19      PT LONG TERM GOAL #2   Title Pt will improve DGI by at least 6 points in order to demonstrate significant improvement in balance and decreased risk for falls.    Baseline 6/24, 06/21/19=12/24    Time 8    Period Weeks    Status Partially Met    Target Date 09/04/19      PT LONG TERM GOAL #3   Title Pt will improve BERG by at least 3 points in order to demonstrate clinically significant improvement in balance.    Baseline 34/56,  5/27= 41/56    Time 8    Period Weeks     Status Partially Met    Target Date 09/04/19      PT LONG TERM GOAL #4   Title Pt will decrease 5TSTS by at least 3 seconds and no UE support in order to demonstrate clinically significant improvement in LE strength.    Baseline 17 seconds, bilateral hand support, 06/21/19= 15 sec    Time 8    Period Weeks    Status Partially Met    Target Date 09/04/19      PT LONG TERM GOAL #5   Title Pt will improve 10 MWT to at least 1 m/s to indicate improved community ambulation and safety.    Baseline .43 ms/ (quick pace) 06/21/19= .53 m/sec    Time 8    Status Partially Met    Target Date 09/04/19                 Plan - 08/02/19 1403    Clinical Impression Statement In general, patient demonstrating good tolerance to therapy session this date, reasonable accommodations are alllowed in-session to allow adequate rest between activities as needed. All interventional executed without any exacerbation of pain or other symptoms. Pt given intermittent multimodal cues to teach best possible form with exercises. Pt has most frequent LOB with eyes closed on foam and overhead gaze on foam, both of which have majority retropulsive LOB. Stepping activities are much better compensated. Pt continues to make steady progress toward most goals. No home exercise updates made at this time.    Personal Factors and Comorbidities Age;Comorbidity 3+    Comorbidities GERD, previous falls, HTN, OA, RA    Examination-Activity Limitations Squat;Stairs;Lift;Bed Mobility;Stand;Reach Overhead;Dressing;Carry;Caring for Others;Transfers    Examination-Participation Restrictions Laundry;Shop;Cleaning;Medication Management;Community Activity;Driving;Yard Work;Meal Prep    Stability/Clinical Decision Making Stable/Uncomplicated    Clinical Decision Making Low    Rehab Potential Good    PT Frequency 2x / week    PT Duration 8 weeks    PT Treatment/Interventions ADLs/Self Care Home Management;Canalith  Repostioning;Cryotherapy;Ultrasound;Traction;Moist Heat;Electrical Stimulation;DME Instruction;Gait training;Stair training;Balance training;Therapeutic exercise;Therapeutic activities;Functional mobility training;Neuromuscular re-education;Patient/family education;Passive range of motion;Energy conservation;Vestibular;Taping;Splinting;Spinal Manipulations;Joint Manipulations    PT Next Visit Plan address balance/strength/ambulation    PT Home Exercise Plan [email or web address]    Consulted and Agree with Plan of Care Patient           Patient will benefit from skilled therapeutic intervention in order to improve the following deficits and impairments:  Abnormal gait, Decreased  balance, Decreased endurance, Decreased mobility, Difficulty walking, Decreased activity tolerance, Decreased strength, Postural dysfunction  Visit Diagnosis: Muscle weakness (generalized)  Other abnormalities of gait and mobility     Problem List Patient Active Problem List   Diagnosis Date Noted   Lump of left thigh 05/07/2019   Poor balance 05/07/2019   Vestibular dizziness 03/31/2018   Hip fracture, right ([place]) 02/27/2018   Pedal edema 09/28/2017   Fall at home 09/28/2017   Prediabetes 11/30/2016   Toenail fungus 10/03/2015   At moderate risk for fall 09/05/2015   Cervical spondylosis without myelopathy 08/05/2015   Routine general medical examination at a health care facility 08/27/2014   Colon cancer screening 08/27/2014   Fall against object 08/15/2014   Encounter for Medicare annual wellness exam 07/25/2013   Varicosities of leg 05/23/2012   Abnormal ultrasound of thyroid gland 03/01/2011   Hyperlipidemia 06/11/2008   Allergic rhinitis 03/29/2008   Essential hypertension, benign 12/26/2006   GERD 12/26/2006   Rheumatoid arthritis ([place]) 12/26/2006   Osteoporosis 12/26/2006   URINARY INCONTINENCE 12/26/2006   2:31 PM, 08/02/19 Etta Grandchild, PT, DPT Physical Therapist - [facility]  Outpatient Physical [place] [phone]     [place] [facility] 08/02/2019, 2:10 PM  [place] [address] [place], [place], [address] Phone: [phone]   Fax:  [phone]  Name: NANCY ARVIN MRN: [id number] Date of Birth: 02/08/1933

## 2019-08-06 ENCOUNTER — Encounter: Payer: Self-pay | Admitting: Physical Therapy

## 2019-08-06 ENCOUNTER — Ambulatory Visit: Payer: Medicare PPO | Admitting: Physical Therapy

## 2019-08-06 ENCOUNTER — Encounter: Payer: Self-pay | Admitting: Dermatology

## 2019-08-06 ENCOUNTER — Other Ambulatory Visit: Payer: Self-pay

## 2019-08-06 DIAGNOSIS — M6281 Muscle weakness (generalized): Secondary | ICD-10-CM | POA: Diagnosis not present

## 2019-08-06 DIAGNOSIS — R2689 Other abnormalities of gait and mobility: Secondary | ICD-10-CM | POA: Diagnosis not present

## 2019-08-06 NOTE — Therapy (Signed)
[place] MAIN [facility] SERVICES [address] [place], [place], [address] Phone: [phone]   Fax:  [phone]  Physical Therapy Treatment Physical Therapy Progress Note   Dates of reporting period 06/21/19 to  08/06/19  Patient Details  Name: Sierra Bentley MRN: [id number] Date of Birth: 23-Oct-1933 No data recorded  Encounter Date: 08/06/2019   PT End of Session - 08/06/19 1524    Visit Number 20    Number of Visits 16    Date for PT Re-Evaluation 09/04/19    Authorization Type Medicare, eval 9/39/0300, cert 10/17/3005    PT Start Time 0315    PT Stop Time 1600    PT Time Calculation (min) 765 min    Equipment Utilized During Treatment Gait belt    Activity Tolerance Patient tolerated treatment well    Behavior During Therapy WFL for tasks assessed/performed           Past Medical History:  Diagnosis Date  . Allergic rhinitis, cause unspecified   . Cramp of limb   . Diverticulosis   . Epistaxis   . GERD (gastroesophageal reflux disease)   . HTN (hypertension)   . Osteopenia   . Other and unspecified hyperlipidemia   . Other malaise and fatigue   . Rheumatoid arthritis(714.0)   . Urine incontinence     Past Surgical History:  Procedure Laterality Date  . BREAST BIOPSY  2/11   fibrocystic change  . BREAST BIOPSY  8/11   fibrocystic change  . COLONOSCOPY  2003  . CYSTOSCOPY  2006  . MOLE REMOVAL     rectal  . TONSILLECTOMY      There were no vitals filed for this visit.   Subjective Assessment - 08/06/19 1522    Subjective Pt reports doign well today. No medical updates this date.    Pertinent History Patient is 86 yo female that  was referred to PT for strength and balance. PMH of fall abut a year ago and pelvic fx and R hip fx, GERD, HTN, OA, osteopenia, RA. Pt is husbands caregiver, performs all household tasks, independent (will walk around house without [facility]). Pt does have a history of visual issues. Patient  reported that she falls, especially with head turns, tends to fall backwards. Pt does complain of dizziness.    Limitations Standing;Walking;House hold activities    How long can you sit comfortably? NA    How long can you walk comfortably? 93mns (guesstimate)    Diagnostic tests NA    Patient Stated Goals not fall, walk better    Currently in Pain? No/denies    Multiple Pain Sites No              [facility] PT Assessment - 08/06/19 0001      Standardized Balance Assessment   Standardized Balance Assessment Berg Balance Test      Berg Balance Test   Sit to Stand Able to stand without using hands and stabilize independently    Standing Unsupported Able to stand safely 2 minutes    Sitting with Back Unsupported but Feet Supported on Floor or Stool Able to sit safely and securely 2 minutes    Stand to Sit Sits safely with minimal use of hands    Transfers Able to transfer safely, minor use of hands    Standing Unsupported with Eyes Closed Able to stand 10 seconds safely    Standing Unsupported with Feet Together Able to place feet together independently and stand  1 minute safely    From Standing, Reach Forward with Outstretched Arm Can reach forward >12 cm safely (5")    From Standing Position, Pick up Object from [place] to pick up shoe safely and easily    From Standing Position, Turn to Look Behind Over each Shoulder Looks behind one side only/other side shows less weight shift    Turn 360 Degrees Able to turn 360 degrees safely one side only in 4 seconds or less    Standing Unsupported, Alternately Place Feet on Step/Stool Able to stand independently and safely and complete 8 steps in 20 seconds    Standing Unsupported, One Foot in [employer] balance while stepping or standing    Standing on One Leg Tries to lift leg/unable to hold 3 seconds but remains standing independently    Total Score 46      Dynamic Gait Index   Level Surface Mild Impairment    Change in Gait Speed Mild  Impairment    Gait with Horizontal Head Turns Mild Impairment    Gait with Vertical Head Turns Mild Impairment    Gait and Pivot Turn Mild Impairment    Step Over Obstacle Moderate Impairment    Step Around Obstacles Mild Impairment    Steps Mild Impairment    Total Score 15               Treatment: Berg balance test performed DGI test performed Outcome measures performed  Patient performed with instruction, verbal cues, tactile cues of therapist: goal: increase tissue extensibility, promote proper posture, improve mobility                   PT Education - 08/06/19 1523    Education Details HEP    Person(s) Educated Patient    Methods Explanation    Comprehension Verbalized understanding            PT Short Term Goals - 05/16/19 1429      PT SHORT TERM GOAL #1   Title Pt will be independent with initial HEP in order to improve strength and balance in order to decrease fall risk and improve function at home and work.    Baseline administered on eval 4/21    Time 4    Period Weeks    Status New    Target Date 06/13/19             PT Long Term Goals - 08/06/19 1524      PT LONG TERM GOAL #1   Title Pt will be independent with HEP in order to improve strength and balance in order to decrease fall risk and improve function at home and work.    Time 8    Period Weeks    Status Partially Met    Target Date 09/04/19      PT LONG TERM GOAL #2   Title Pt will improve DGI by at least 6 points in order to demonstrate significant improvement in balance and decreased risk for falls.    Baseline 6/24, 06/21/19=12/24, 08/06/19- 15/24    Time 8    Period Weeks    Status Partially Met    Target Date 09/04/19      PT LONG TERM GOAL #3   Title Pt will improve BERG by at least 3 points in order to demonstrate clinically significant improvement in balance.    Baseline 34/56,  5/27= 41/56, 08/06/19= 46/56    Time 8    Period Weeks  Status Partially Met     Target Date 09/04/19      PT LONG TERM GOAL #4   Title Pt will decrease 5TSTS by at least 3 seconds and no UE support in order to demonstrate clinically significant improvement in LE strength.    Baseline 17 seconds, bilateral hand support, 06/21/19= 15 sec, 08/06/19 13.21 sec    Time 8    Period Weeks    Status Partially Met    Target Date 09/04/19      PT LONG TERM GOAL #5   Title Pt will improve 10 MWT to at least 1 m/s to indicate improved community ambulation and safety.    Baseline .43 ms/ (quick pace) 06/21/19= .53 m/sec, 08/06/19=. 92 m/sec    Time 8    Status Partially Met    Target Date 09/04/19                 Plan - 08/06/19 1524    Clinical Impression Statement Patient's condition has the potential to improve in response to therapy. Maximum improvement is yet to be obtained. The anticipated improvement is attainable and reasonable in a generally predictable time.  Patient reports that her functional mobility depends on how active she is during the day. Her outcome measures improved and goals were reviewed and progressed. Patient will continue to benefit from skilled PT to improve mobility..    Personal Factors and Comorbidities Age;Comorbidity 3+    Comorbidities GERD, previous falls, HTN, OA, RA    Examination-Activity Limitations Squat;Stairs;Lift;Bed Mobility;Stand;Reach Overhead;Dressing;Carry;Caring for Others;Transfers    Examination-Participation Restrictions Laundry;Shop;Cleaning;Medication Management;Community Activity;Driving;Yard Work;Meal Prep    Stability/Clinical Decision Making Stable/Uncomplicated    Rehab Potential Good    PT Frequency 2x / week    PT Duration 8 weeks    PT Treatment/Interventions ADLs/Self Care Home Management;Canalith Repostioning;Cryotherapy;Ultrasound;Traction;Moist Heat;Electrical Stimulation;DME Instruction;Gait training;Stair training;Balance training;Therapeutic exercise;Therapeutic activities;Functional mobility  training;Neuromuscular re-education;Patient/family education;Passive range of motion;Energy conservation;Vestibular;Taping;Splinting;Spinal Manipulations;Joint Manipulations    PT Next Visit Plan address balance/strength/ambulation    PT Home Exercise Plan [email or web address]    Consulted and Agree with Plan of Care Patient           Patient will benefit from skilled therapeutic intervention in order to improve the following deficits and impairments:  Abnormal gait, Decreased balance, Decreased endurance, Decreased mobility, Difficulty walking, Decreased activity tolerance, Decreased strength, Postural dysfunction  Visit Diagnosis: Other abnormalities of gait and mobility  Muscle weakness (generalized)     Problem List Patient Active Problem List   Diagnosis Date Noted  . Lump of left thigh 05/07/2019  . Poor balance 05/07/2019  . Vestibular dizziness 03/31/2018  . Hip fracture, right ([place]) 02/27/2018  . Pedal edema 09/28/2017  . Fall at home 09/28/2017  . Prediabetes 11/30/2016  . Toenail fungus 10/03/2015  . At moderate risk for fall 09/05/2015  . Cervical spondylosis without myelopathy 08/05/2015  . Routine general medical examination at a health care facility 08/27/2014  . Colon cancer screening 08/27/2014  . Fall against object 08/15/2014  . Encounter for Medicare annual wellness exam 07/25/2013  . Varicosities of leg 05/23/2012  . Abnormal ultrasound of thyroid gland 03/01/2011  . Hyperlipidemia 06/11/2008  . Allergic rhinitis 03/29/2008  . Essential hypertension, benign 12/26/2006  . GERD 12/26/2006  . Rheumatoid arthritis ([place]) 12/26/2006  . Osteoporosis 12/26/2006  . URINARY INCONTINENCE 12/26/2006    Alanson Puls, [place] DPT 08/06/2019, 4:00 PM  [place] MAIN [facility] SERVICES [place], [place],  [place] Phone: [phone]   Fax:  [phone]  Name: CORLEY KOHLS MRN: [id number] Date of Birth:  01/23/1934

## 2019-08-08 ENCOUNTER — Other Ambulatory Visit: Payer: Self-pay

## 2019-08-08 ENCOUNTER — Ambulatory Visit: Payer: Medicare PPO

## 2019-08-08 DIAGNOSIS — R2689 Other abnormalities of gait and mobility: Secondary | ICD-10-CM | POA: Diagnosis not present

## 2019-08-08 DIAGNOSIS — M6281 Muscle weakness (generalized): Secondary | ICD-10-CM | POA: Diagnosis not present

## 2019-08-08 NOTE — Therapy (Signed)
[place] MAIN [facility] SERVICES [address] [place], [place], [address] Phone: [phone]   Fax:  [phone]  Physical Therapy Treatment  Patient Details  Name: Sierra Bentley MRN: [id number] Date of Birth: 1933-02-18 No data recorded  Encounter Date: 08/08/2019   PT End of Session - 08/08/19 1711    Visit Number 21    Number of Visits 16    Date for PT Re-Evaluation 09/04/19    Authorization Type Medicare, eval 3/76/2831, cert 06/11/6158    PT Start Time 1347    PT Stop Time 1429    PT Time Calculation (min) 42 min    Equipment Utilized During Treatment Gait belt    Activity Tolerance Patient tolerated treatment well    Behavior During Therapy WFL for tasks assessed/performed           Past Medical History:  Diagnosis Date  . Allergic rhinitis, cause unspecified   . Cramp of limb   . Diverticulosis   . Epistaxis   . GERD (gastroesophageal reflux disease)   . HTN (hypertension)   . Osteopenia   . Other and unspecified hyperlipidemia   . Other malaise and fatigue   . Rheumatoid arthritis(714.0)   . Urine incontinence     Past Surgical History:  Procedure Laterality Date  . BREAST BIOPSY  2/11   fibrocystic change  . BREAST BIOPSY  8/11   fibrocystic change  . COLONOSCOPY  2003  . CYSTOSCOPY  2006  . MOLE REMOVAL     rectal  . TONSILLECTOMY      There were no vitals filed for this visit.   Subjective Assessment - 08/08/19 1709    Subjective Patient reports she is tired from having to take care of her husband. Can only do afternoon appointments because of his times. No falls or LOB since last session.    Pertinent History Patient is 86 yo female that  was referred to PT for strength and balance. PMH of fall abut a year ago and pelvic fx and R hip fx, GERD, HTN, OA, osteopenia, RA. Pt is husbands caregiver, performs all household tasks, independent (will walk around house without [facility]). Pt does have a history of visual  issues. Patient reported that she falls, especially with head turns, tends to fall backwards. Pt does complain of dizziness.    Limitations Standing;Walking;House hold activities    How long can you sit comfortably? NA    How long can you walk comfortably? 87mns (guesstimate)    Diagnostic tests NA    Patient Stated Goals not fall, walk better    Currently in Pain? No/denies                 Treatment:     Standing with CGA next to support surface:  Airex pad: static stand 30 seconds x 2 trials, noticeable trembling of ankles/LE's with fatigue and challenge to maintain stability Airex pad: horizontal head turns 30 seconds scanning room 10x ; cueing for arc of motion  Airex pad: vertical head turns 30 seconds, cueing for arc of motion, noticeable sway with upward gaze increasing demand on ankle righting reaction musculature Airex pad: one foot on 6" step one foot on airex pad, hold position for 30 seconds, switch legs, 2x each LE;  airex balance beam side stepping no UE support 6x length of // bars airex balance beam: tandem walk SUE to no UE support 8x length of // bars  Speed ladder: one foot in  each square; 4x length of // bars   In hallway; close CGA no UE support/AD  Horizontal head turn; two near LOB with increased weaving  Vertical head turn; one near LOB with increased scissoring  4 way head turn; very challenging, requires min A x 3 for retaining COM Side step x4 x 20 ft  STS with airex pad under feet: 10x   5lb weighted bar STS cues for knees apart 5lb bar standing marches with bar overhead.x10        Pt educated throughout session about proper posture and technique with exercises. Improved exercise technique, movement at target joints, use of target muscles after min to mod verbal, visual, tactile cues.  Patient presents with excellent motivation thorughout physical therapy session, reporting enjoyment with dynamic stability interventions due to challenge with  at home. Head turns and dual task are challenging for patient and will be an area for continued focus. Patient will continue to benefit from skilled PT to improve mobility                PT Education - 08/08/19 1711    Education Details exercise technique, body mechanics    Person(s) Educated Patient    Methods Explanation;Demonstration;Tactile cues;Verbal cues    Comprehension Verbalized understanding;Returned demonstration;Verbal cues required;Tactile cues required            PT Short Term Goals - 05/16/19 1429      PT SHORT TERM GOAL #1   Title Pt will be independent with initial HEP in order to improve strength and balance in order to decrease fall risk and improve function at home and work.    Baseline administered on eval 4/21    Time 4    Period Weeks    Status New    Target Date 06/13/19             PT Long Term Goals - 08/06/19 1524      PT LONG TERM GOAL #1   Title Pt will be independent with HEP in order to improve strength and balance in order to decrease fall risk and improve function at home and work.    Time 8    Period Weeks    Status Partially Met    Target Date 09/04/19      PT LONG TERM GOAL #2   Title Pt will improve DGI by at least 6 points in order to demonstrate significant improvement in balance and decreased risk for falls.    Baseline 6/24, 06/21/19=12/24, 08/06/19- 15/24    Time 8    Period Weeks    Status Partially Met    Target Date 09/04/19      PT LONG TERM GOAL #3   Title Pt will improve BERG by at least 3 points in order to demonstrate clinically significant improvement in balance.    Baseline 34/56,  5/27= 41/56, 08/06/19= 46/56    Time 8    Period Weeks    Status Partially Met    Target Date 09/04/19      PT LONG TERM GOAL #4   Title Pt will decrease 5TSTS by at least 3 seconds and no UE support in order to demonstrate clinically significant improvement in LE strength.    Baseline 17 seconds, bilateral hand support,  06/21/19= 15 sec, 08/06/19 13.21 sec    Time 8    Period Weeks    Status Partially Met    Target Date 09/04/19      PT LONG TERM  GOAL #5   Title Pt will improve 10 MWT to at least 1 m/s to indicate improved community ambulation and safety.    Baseline .43 ms/ (quick pace) 06/21/19= .53 m/sec, 08/06/19=. 92 m/sec    Time 8    Status Partially Met    Target Date 09/04/19      Additional Long Term Goals   Additional Long Term Goals Yes      PT LONG TERM GOAL #6   Title Patients FOTO score will improve to show functional improvements.    Time 8    Period Weeks    Status New    Target Date 09/04/19                 Plan - 08/08/19 1715    Clinical Impression Statement Patient presents with excellent motivation thorughout physical therapy session, reporting enjoyment with dynamic stability interventions due to challenge with at home. Head turns and dual task are challenging for patient and will be an area for continued focus. Patient will continue to benefit from skilled PT to improve mobility    Personal Factors and Comorbidities Age;Comorbidity 3+    Comorbidities GERD, previous falls, HTN, OA, RA    Examination-Activity Limitations Squat;Stairs;Lift;Bed Mobility;Stand;Reach Overhead;Dressing;Carry;Caring for Others;Transfers    Examination-Participation Restrictions Laundry;Shop;Cleaning;Medication Management;Community Activity;Driving;Yard Work;Meal Prep    Stability/Clinical Decision Making Stable/Uncomplicated    Rehab Potential Good    PT Frequency 2x / week    PT Duration 8 weeks    PT Treatment/Interventions ADLs/Self Care Home Management;Canalith Repostioning;Cryotherapy;Ultrasound;Traction;Moist Heat;Electrical Stimulation;DME Instruction;Gait training;Stair training;Balance training;Therapeutic exercise;Therapeutic activities;Functional mobility training;Neuromuscular re-education;Patient/family education;Passive range of motion;Energy  conservation;Vestibular;Taping;Splinting;Spinal Manipulations;Joint Manipulations    PT Next Visit Plan address balance/strength/ambulation    PT Home Exercise Plan [email or web address]    Consulted and Agree with Plan of Care Patient           Patient will benefit from skilled therapeutic intervention in order to improve the following deficits and impairments:  Abnormal gait, Decreased balance, Decreased endurance, Decreased mobility, Difficulty walking, Decreased activity tolerance, Decreased strength, Postural dysfunction  Visit Diagnosis: Other abnormalities of gait and mobility  Muscle weakness (generalized)     Problem List Patient Active Problem List   Diagnosis Date Noted  . Lump of left thigh 05/07/2019  . Poor balance 05/07/2019  . Vestibular dizziness 03/31/2018  . Hip fracture, right ([place]) 02/27/2018  . Pedal edema 09/28/2017  . Fall at home 09/28/2017  . Prediabetes 11/30/2016  . Toenail fungus 10/03/2015  . At moderate risk for fall 09/05/2015  . Cervical spondylosis without myelopathy 08/05/2015  . Routine general medical examination at a health care facility 08/27/2014  . Colon cancer screening 08/27/2014  . Fall against object 08/15/2014  . Encounter for Medicare annual wellness exam 07/25/2013  . Varicosities of leg 05/23/2012  . Abnormal ultrasound of thyroid gland 03/01/2011  . Hyperlipidemia 06/11/2008  . Allergic rhinitis 03/29/2008  . Essential hypertension, benign 12/26/2006  . GERD 12/26/2006  . Rheumatoid arthritis ([place]) 12/26/2006  . Osteoporosis 12/26/2006  . URINARY INCONTINENCE 12/26/2006   Janna Arch, PT, DPT   08/08/2019, 5:16 PM  [place] MAIN [facility] SERVICES [address] [place], [place], [address] Phone: [phone]   Fax:  [phone]  Name: Sierra Bentley MRN: [id number] Date of Birth: May 02, 1933

## 2019-08-09 ENCOUNTER — Ambulatory Visit: Payer: Medicare PPO | Admitting: Physical Therapy

## 2019-08-13 ENCOUNTER — Ambulatory Visit: Payer: Medicare PPO | Admitting: Physical Therapy

## 2019-08-13 ENCOUNTER — Other Ambulatory Visit: Payer: Self-pay

## 2019-08-13 ENCOUNTER — Encounter: Payer: Self-pay | Admitting: Physical Therapy

## 2019-08-13 ENCOUNTER — Ambulatory Visit: Payer: Medicare PPO

## 2019-08-13 DIAGNOSIS — M6281 Muscle weakness (generalized): Secondary | ICD-10-CM

## 2019-08-13 DIAGNOSIS — R2689 Other abnormalities of gait and mobility: Secondary | ICD-10-CM | POA: Diagnosis not present

## 2019-08-13 NOTE — Therapy (Signed)
[place] MAIN [facility] SERVICES [address] [place], [place], [address] Phone: [phone]   Fax:  [phone]  Physical Therapy Treatment  Patient Details  Name: Sierra Bentley MRN: [id number] Date of Birth: Nov 02, 1933 No data recorded  Encounter Date: 08/13/2019   PT End of Session - 08/13/19 1531    Visit Number 21    Number of Visits 16    Date for PT Re-Evaluation 09/04/19    Authorization Type Medicare, eval 0/25/8527, cert 7/82/4235    PT Start Time 1530    PT Stop Time 1615    PT Time Calculation (min) 45 min    Equipment Utilized During Treatment Gait belt    Activity Tolerance Patient tolerated treatment well    Behavior During Therapy WFL for tasks assessed/performed           Past Medical History:  Diagnosis Date  . Allergic rhinitis, cause unspecified   . Cramp of limb   . Diverticulosis   . Epistaxis   . GERD (gastroesophageal reflux disease)   . HTN (hypertension)   . Osteopenia   . Other and unspecified hyperlipidemia   . Other malaise and fatigue   . Rheumatoid arthritis(714.0)   . Urine incontinence     Past Surgical History:  Procedure Laterality Date  . BREAST BIOPSY  2/11   fibrocystic change  . BREAST BIOPSY  8/11   fibrocystic change  . COLONOSCOPY  2003  . CYSTOSCOPY  2006  . MOLE REMOVAL     rectal  . TONSILLECTOMY      There were no vitals filed for this visit.   Subjective Assessment - 08/13/19 1530    Subjective Patient reported that she is doing well, no falls or stumbles since last session.    Pertinent History Patient is 86 yo female that  was referred to PT for strength and balance. PMH of fall abut a year ago and pelvic fx and R hip fx, GERD, HTN, OA, osteopenia, RA. Pt is husbands caregiver, performs all household tasks, independent (will walk around house without [facility]). Pt does have a history of visual issues. Patient reported that she falls, especially with head turns, tends to fall  backwards. Pt does complain of dizziness.    Limitations Standing;Walking;House hold activities    How long can you sit comfortably? NA    How long can you walk comfortably? 76mns (guesstimate)    Diagnostic tests NA    Patient Stated Goals not fall, walk better    Currently in Pain? No/denies              Treatment:     Standing with CGA next to support surface:   Airex pad: static stand 30 seconds x 2 trials, noticeable trembling of ankles/LE's with fatigue and challenge to maintain stability  Airex pad: horizontal head turns 2x10 ; cueing for arc of motion   Airex pad: vertical head turns 2x10 increased demand on ankle righting reaction musculature ; ant lean noted this session Airex pad: one foot on 6" step one foot on airex pad, hold position for 30 seconds, switch legs, 2x each LE;  Sidestepping in bars x6 lengths on even surface no UE support    airex balance beam side stepping no UE support 6x length of // bars  airex balance beam: tandem walk SUE to no UE support 8x length of // bars     Speed ladder: one foot in each square; 4x length, cues to  avoid staring at feet and step length, upright posture.    In hallway; close CGA no UE support/AD   Horizontal head turn; two near LOB with increased weaving   Vertical head turn; one near LOB with increased scissoring   Stop and pivot turns, PT cueing for random directions x4 lengths, CGA-minA for one instance of assistance needed for LOB.     STS with airex pad under feet: 10x, unable to complete without minA. x10 performed with airex pad also in chair x10 with CGA. x10 from standard chair and minA 2x5. Initially able to complete with CGA, but fatigued and needed minA.     Pt response/clinical impression: Pt challenged by sit to stands from standard chair with airex pad under feet and no UE support. Unable to complete from standard chair, able to complete with CGA with elevated seat. Pt also challenged by dual tasking, often  stops to speak with therapist during interventions due to this challenge. The patient would benefit from further skilled PT intervention to continue to progress towards goals and improve functional abilities.      PT Education - 08/13/19 1530    Education Details thx technique    Person(s) Educated Patient    Methods Explanation;Demonstration;Tactile cues;Verbal cues    Comprehension Verbalized understanding;Returned demonstration;Verbal cues required;Tactile cues required;Need further instruction            PT Short Term Goals - 05/16/19 1429      PT SHORT TERM GOAL #1   Title Pt will be independent with initial HEP in order to improve strength and balance in order to decrease fall risk and improve function at home and work.    Baseline administered on eval 4/21    Time 4    Period Weeks    Status New    Target Date 06/13/19             PT Long Term Goals - 08/06/19 1524      PT LONG TERM GOAL #1   Title Pt will be independent with HEP in order to improve strength and balance in order to decrease fall risk and improve function at home and work.    Time 8    Period Weeks    Status Partially Met    Target Date 09/04/19      PT LONG TERM GOAL #2   Title Pt will improve DGI by at least 6 points in order to demonstrate significant improvement in balance and decreased risk for falls.    Baseline 6/24, 06/21/19=12/24, 08/06/19- 15/24    Time 8    Period Weeks    Status Partially Met    Target Date 09/04/19      PT LONG TERM GOAL #3   Title Pt will improve BERG by at least 3 points in order to demonstrate clinically significant improvement in balance.    Baseline 34/56,  5/27= 41/56, 08/06/19= 46/56    Time 8    Period Weeks    Status Partially Met    Target Date 09/04/19      PT LONG TERM GOAL #4   Title Pt will decrease 5TSTS by at least 3 seconds and no UE support in order to demonstrate clinically significant improvement in LE strength.    Baseline 17 seconds,  bilateral hand support, 06/21/19= 15 sec, 08/06/19 13.21 sec    Time 8    Period Weeks    Status Partially Met    Target Date 09/04/19  PT LONG TERM GOAL #5   Title Pt will improve 10 MWT to at least 1 m/s to indicate improved community ambulation and safety.    Baseline .43 ms/ (quick pace) 06/21/19= .53 m/sec, 08/06/19=. 92 m/sec    Time 8    Status Partially Met    Target Date 09/04/19      Additional Long Term Goals   Additional Long Term Goals Yes      PT LONG TERM GOAL #6   Title Patients FOTO score will improve to show functional improvements.    Time 8    Period Weeks    Status New    Target Date 09/04/19                 Plan - 08/13/19 1530    Clinical Impression Statement Pt challenged by sit to stands from standard chair with airex pad under feet and no UE support. Unable to complete from standard chair, able to complete with CGA with elevated seat. Pt also challenged by dual tasking, often stops to speak with therapist during interventions due to this challenge. The patient would benefit from further skilled PT intervention to continue to progress towards goals and improve functional abilities.    Personal Factors and Comorbidities Age;Comorbidity 3+    Comorbidities GERD, previous falls, HTN, OA, RA    Examination-Activity Limitations Squat;Stairs;Lift;Bed Mobility;Stand;Reach Overhead;Dressing;Carry;Caring for Others;Transfers    Examination-Participation Restrictions Laundry;Shop;Cleaning;Medication Management;Community Activity;Driving;Yard Work;Meal Prep    Stability/Clinical Decision Making Stable/Uncomplicated    Rehab Potential Good    PT Frequency 2x / week    PT Duration 8 weeks    PT Treatment/Interventions ADLs/Self Care Home Management;Canalith Repostioning;Cryotherapy;Ultrasound;Traction;Moist Heat;Electrical Stimulation;DME Instruction;Gait training;Stair training;Balance training;Therapeutic exercise;Therapeutic activities;Functional mobility  training;Neuromuscular re-education;Patient/family education;Passive range of motion;Energy conservation;Vestibular;Taping;Splinting;Spinal Manipulations;Joint Manipulations    PT Next Visit Plan address balance/strength/ambulation    PT Home Exercise Plan [email or web address]    Consulted and Agree with Plan of Care Patient           Patient will benefit from skilled therapeutic intervention in order to improve the following deficits and impairments:  Abnormal gait, Decreased balance, Decreased endurance, Decreased mobility, Difficulty walking, Decreased activity tolerance, Decreased strength, Postural dysfunction  Visit Diagnosis: Other abnormalities of gait and mobility  Muscle weakness (generalized)     Problem List Patient Active Problem List   Diagnosis Date Noted  . Lump of left thigh 05/07/2019  . Poor balance 05/07/2019  . Vestibular dizziness 03/31/2018  . Hip fracture, right ([place]) 02/27/2018  . Pedal edema 09/28/2017  . Fall at home 09/28/2017  . Prediabetes 11/30/2016  . Toenail fungus 10/03/2015  . At moderate risk for fall 09/05/2015  . Cervical spondylosis without myelopathy 08/05/2015  . Routine general medical examination at a health care facility 08/27/2014  . Colon cancer screening 08/27/2014  . Fall against object 08/15/2014  . Encounter for Medicare annual wellness exam 07/25/2013  . Varicosities of leg 05/23/2012  . Abnormal ultrasound of thyroid gland 03/01/2011  . Hyperlipidemia 06/11/2008  . Allergic rhinitis 03/29/2008  . Essential hypertension, benign 12/26/2006  . GERD 12/26/2006  . Rheumatoid arthritis ([place]) 12/26/2006  . Osteoporosis 12/26/2006  . URINARY INCONTINENCE 12/26/2006    Lieutenant Diego PT, DPT 4:18 PM,08/13/19   [place] MAIN [facility] SERVICES [address] [place], [place], [address] Phone: [phone]   Fax:  [phone]  Name: Sierra Bentley MRN: [id number] Date of Birth: 1933/06/30

## 2019-08-15 ENCOUNTER — Other Ambulatory Visit: Payer: Self-pay

## 2019-08-15 ENCOUNTER — Ambulatory Visit: Payer: Medicare PPO

## 2019-08-15 ENCOUNTER — Ambulatory Visit: Payer: Medicare PPO | Admitting: Physical Therapy

## 2019-08-15 DIAGNOSIS — M6281 Muscle weakness (generalized): Secondary | ICD-10-CM

## 2019-08-15 DIAGNOSIS — R2689 Other abnormalities of gait and mobility: Secondary | ICD-10-CM | POA: Diagnosis not present

## 2019-08-15 NOTE — Therapy (Signed)
[place] MAIN [facility] SERVICES [address] [place], [place], [address] Phone: [phone]   Fax:  [phone]  Physical Therapy Treatment  Patient Details  Name: Sierra Bentley MRN: [id number] Date of Birth: 1933/08/31 No data recorded  Encounter Date: 08/15/2019   PT End of Session - 08/15/19 1604    Visit Number 22    Number of Visits 16    Date for PT Re-Evaluation 09/04/19    Authorization Type Medicare, eval 9/41/7408, cert 1/44/8185    PT Start Time 1600    PT Stop Time 1645    PT Time Calculation (min) 45 min    Equipment Utilized During Treatment Gait belt    Activity Tolerance Patient tolerated treatment well    Behavior During Therapy WFL for tasks assessed/performed           Past Medical History:  Diagnosis Date  . Allergic rhinitis, cause unspecified   . Cramp of limb   . Diverticulosis   . Epistaxis   . GERD (gastroesophageal reflux disease)   . HTN (hypertension)   . Osteopenia   . Other and unspecified hyperlipidemia   . Other malaise and fatigue   . Rheumatoid arthritis(714.0)   . Urine incontinence     Past Surgical History:  Procedure Laterality Date  . BREAST BIOPSY  2/11   fibrocystic change  . BREAST BIOPSY  8/11   fibrocystic change  . COLONOSCOPY  2003  . CYSTOSCOPY  2006  . MOLE REMOVAL     rectal  . TONSILLECTOMY      There were no vitals filed for this visit.   Subjective Assessment - 08/15/19 1603    Subjective Patient reported that she is doing well, no falls or stumbles since last session.    Pertinent History Patient is 86 yo female that  was referred to PT for strength and balance. PMH of fall abut a year ago and pelvic fx and R hip fx, GERD, HTN, OA, osteopenia, RA. Pt is husbands caregiver, performs all household tasks, independent (will walk around house without [facility]). Pt does have a history of visual issues. Patient reported that she falls, especially with head turns, tends to fall  backwards. Pt does complain of dizziness.    Limitations Standing;Walking;House hold activities    How long can you sit comfortably? NA    How long can you walk comfortably? 15mns (guesstimate)    Diagnostic tests NA    Patient Stated Goals not fall, walk better    Currently in Pain? No/denies               TREATMENT  Neuromuscular Re-education Nustep L2/3 x 5 minutes during history (1 minute unbilled)  Standing with CGA next to support surface:   Airex pad: horizontal head turns x 60 secs Airex pad: vertical head turns x 60 secs Airex pad: balloon tosses x multiple bouts, loss balance when outside of base of support; Airex pad: one foot on 6" step one foot on airex pad, with ball passes x 15 each leg, loss balance when outside of base of support; Airex balance beam side stepping SUE to no UE support x3 length; Airex balance beam: tandem walk SUE to no UE support x2 length,     Speed ladder: one foot in each square; 4x length, patient reports being easy    In hallway; close CGA no UE support,   Horizontal head turn x 2 lengths, no loss of balance; Vertical head turn  x 2 lengths, no loss of balance; Horizontal and vertical ball tosses and bounces x 2 lengths each; Patient loss some balance when ball was outside base of support. CGA-minA for one instance of assistance needed for LOB.     STS with airex pad under feet and pad in chair: x10 with CGA, cued to keep knees wide, added GTB around knees for external cue x5 Hurdle steps  Pt educated throughout session about proper posture and technique with exercises. Improved exercise technique, movement at target joints, use of target muscles after min to mod verbal, visual, tactile cues.      Pt demonstrates excellent motivation during session today. Balance progressed to harder exercises utilizing dynamic activities during standing. She is able to complete all exercises as instructed today. Session focused on exercises in the //  bars today with a focus of balance exercises. Patient notes fear of falling if there was no guarding from the therapist. She requires verbal and tactile cues as well as assist for exercises today and would be unable to complete all of her exercises without the direction of a physical therapist. Pt will benefit from PT services to address deficits in strength and balance in order to return to full function at home.          PT Short Term Goals - 05/16/19 1429      PT SHORT TERM GOAL #1   Title Pt will be independent with initial HEP in order to improve strength and balance in order to decrease fall risk and improve function at home and work.    Baseline administered on eval 4/21    Time 4    Period Weeks    Status New    Target Date 06/13/19             PT Long Term Goals - 08/06/19 1524      PT LONG TERM GOAL #1   Title Pt will be independent with HEP in order to improve strength and balance in order to decrease fall risk and improve function at home and work.    Time 8    Period Weeks    Status Partially Met    Target Date 09/04/19      PT LONG TERM GOAL #2   Title Pt will improve DGI by at least 6 points in order to demonstrate significant improvement in balance and decreased risk for falls.    Baseline 6/24, 06/21/19=12/24, 08/06/19- 15/24    Time 8    Period Weeks    Status Partially Met    Target Date 09/04/19      PT LONG TERM GOAL #3   Title Pt will improve BERG by at least 3 points in order to demonstrate clinically significant improvement in balance.    Baseline 34/56,  5/27= 41/56, 08/06/19= 46/56    Time 8    Period Weeks    Status Partially Met    Target Date 09/04/19      PT LONG TERM GOAL #4   Title Pt will decrease 5TSTS by at least 3 seconds and no UE support in order to demonstrate clinically significant improvement in LE strength.    Baseline 17 seconds, bilateral hand support, 06/21/19= 15 sec, 08/06/19 13.21 sec    Time 8    Period Weeks    Status  Partially Met    Target Date 09/04/19      PT LONG TERM GOAL #5   Title Pt will improve 10 MWT to  at least 1 m/s to indicate improved community ambulation and safety.    Baseline .43 ms/ (quick pace) 06/21/19= .53 m/sec, 08/06/19=. 92 m/sec    Time 8    Status Partially Met    Target Date 09/04/19      Additional Long Term Goals   Additional Long Term Goals Yes      PT LONG TERM GOAL #6   Title Patients FOTO score will improve to show functional improvements.    Time 8    Period Weeks    Status New    Target Date 09/04/19                 Plan - 08/15/19 1605    Clinical Impression Statement Pt demonstrates excellent motivation during session today. Balance progressed to harder exercises utilizing dynamic activities during standing. She is able to complete all exercises as instructed today. Session focused on exercises in the // bars today with a focus of balance exercises. Patient notes fear of falling if there was no guarding from the therapist. She requires verbal and tactile cues as well as assist for exercises today and would be unable to complete all of her exercises without the direction of a physical therapist. Pt will benefit from PT services to address deficits in strength and balance in order to return to full function at home.    Personal Factors and Comorbidities Age;Comorbidity 3+    Comorbidities GERD, previous falls, HTN, OA, RA    Examination-Activity Limitations Squat;Stairs;Lift;Bed Mobility;Stand;Reach Overhead;Dressing;Carry;Caring for Others;Transfers    Examination-Participation Restrictions Laundry;Shop;Cleaning;Medication Management;Community Activity;Driving;Yard Work;Meal Prep    Stability/Clinical Decision Making Stable/Uncomplicated    Rehab Potential Good    PT Frequency 2x / week    PT Duration 8 weeks    PT Treatment/Interventions ADLs/Self Care Home Management;Canalith Repostioning;Cryotherapy;Ultrasound;Traction;Moist Heat;Electrical  Stimulation;DME Instruction;Gait training;Stair training;Balance training;Therapeutic exercise;Therapeutic activities;Functional mobility training;Neuromuscular re-education;Patient/family education;Passive range of motion;Energy conservation;Vestibular;Taping;Splinting;Spinal Manipulations;Joint Manipulations    PT Next Visit Plan address balance/strength/ambulation    PT Home Exercise Plan [email or web address]    Consulted and Agree with Plan of Care Patient           Patient will benefit from skilled therapeutic intervention in order to improve the following deficits and impairments:  Abnormal gait, Decreased balance, Decreased endurance, Decreased mobility, Difficulty walking, Decreased activity tolerance, Decreased strength, Postural dysfunction  Visit Diagnosis: Other abnormalities of gait and mobility  Muscle weakness (generalized)     Problem List Patient Active Problem List   Diagnosis Date Noted  . Lump of left thigh 05/07/2019  . Poor balance 05/07/2019  . Vestibular dizziness 03/31/2018  . Hip fracture, right ([place]) 02/27/2018  . Pedal edema 09/28/2017  . Fall at home 09/28/2017  . Prediabetes 11/30/2016  . Toenail fungus 10/03/2015  . At moderate risk for fall 09/05/2015  . Cervical spondylosis without myelopathy 08/05/2015  . Routine general medical examination at a health care facility 08/27/2014  . Colon cancer screening 08/27/2014  . Fall against object 08/15/2014  . Encounter for Medicare annual wellness exam 07/25/2013  . Varicosities of leg 05/23/2012  . Abnormal ultrasound of thyroid gland 03/01/2011  . Hyperlipidemia 06/11/2008  . Allergic rhinitis 03/29/2008  . Essential hypertension, benign 12/26/2006  . GERD 12/26/2006  . Rheumatoid arthritis ([place]) 12/26/2006  . Osteoporosis 12/26/2006  . URINARY INCONTINENCE 12/26/2006    This entire session was performed under direct supervision and direction of a licensed therapist/therapist assistant . I have personally  read, edited and approve of the note  as written.   Noemi Chapel, SPT Phillips Grout PT, DPT, GCS  Huprich,Jason 08/16/2019, 12:07 PM  [place] MAIN [facility] SERVICES [address] [place], [place], [address] Phone: [phone]   Fax:  [phone]  Name: Sierra Bentley MRN: [id number] Date of Birth: Nov 01, 1933

## 2019-08-20 ENCOUNTER — Ambulatory Visit: Payer: Medicare PPO | Admitting: Physical Therapy

## 2019-08-20 ENCOUNTER — Other Ambulatory Visit: Payer: Self-pay

## 2019-08-20 ENCOUNTER — Ambulatory Visit: Payer: Medicare PPO

## 2019-08-20 DIAGNOSIS — R2689 Other abnormalities of gait and mobility: Secondary | ICD-10-CM | POA: Diagnosis not present

## 2019-08-20 DIAGNOSIS — M6281 Muscle weakness (generalized): Secondary | ICD-10-CM

## 2019-08-20 NOTE — Therapy (Signed)
[place] MAIN [facility] SERVICES [address] [place], [place], [address] Phone: [phone]   Fax:  [phone]  Physical Therapy Treatment  Patient Details  Name: Sierra Bentley MRN: [id number] Date of Birth: [birth year] No data recorded  Encounter Date: 08/20/2019   PT End of Session - 08/20/19 1513    Visit Number 23    Number of Visits 16    Date for PT Re-Evaluation 09/04/19    Authorization Type Medicare, eval 6/59/9357, cert 0/17/7939    PT Start Time 1515    PT Stop Time 1600    PT Time Calculation (min) 45 min    Equipment Utilized During Treatment Gait belt    Activity Tolerance Patient tolerated treatment well    Behavior During Therapy WFL for tasks assessed/performed           Past Medical History:  Diagnosis Date  . Allergic rhinitis, cause unspecified   . Cramp of limb   . Diverticulosis   . Epistaxis   . GERD (gastroesophageal reflux disease)   . HTN (hypertension)   . Osteopenia   . Other and unspecified hyperlipidemia   . Other malaise and fatigue   . Rheumatoid arthritis(714.0)   . Urine incontinence     Past Surgical History:  Procedure Laterality Date  . BREAST BIOPSY  2/11   fibrocystic change  . BREAST BIOPSY  8/11   fibrocystic change  . COLONOSCOPY  2003  . CYSTOSCOPY  2006  . MOLE REMOVAL     rectal  . TONSILLECTOMY      There were no vitals filed for this visit.   Subjective Assessment - 08/20/19 1513    Subjective Patient reported that she is doing well. She did have stumble last week when she turned her head quickly and lost her balance. She was able to catch herself and avoid falling all the way to the floor.    Pertinent History Patient is 86 yo female that  was referred to PT for strength and balance. PMH of fall abut a year ago and pelvic fx and R hip fx, GERD, HTN, OA, osteopenia, RA. Pt is husbands caregiver, performs all household tasks, independent (will walk around house without  [facility]). Pt does have a history of visual issues. Patient reported that she falls, especially with head turns, tends to fall backwards. Pt does complain of dizziness.    Limitations Standing;Walking;House hold activities    How long can you sit comfortably? NA    How long can you walk comfortably? 8mns (guesstimate)    Diagnostic tests NA    Patient Stated Goals not fall, walk better    Currently in Pain? No/denies               TREATMENT   Ther-ex  Octane xRide L4 x 5 minutes for warm-up during history (3 minutes unbilled); Precor BLE leg press 55# x 20, 70# x 25; Standing heel raises with BUE support x 20; Standing squats with extensive verbal and tactile cues for proper technique x 10;   Neuromuscular Re-education Alternating forward BOSU (round side up) lunges without UE support x 10 BLE; BOSU static balance (flat side up) without UE support, added squats x 10; Pball sitting static balance without UE support and NBOS x 30s; Pball stability with alternating marches without UE support x 10 on each side; Pball stability with ball tosses to student PT x 1 minute; In hallway; close CGA no UE support,   Horizontal  ball tosses to therapist with head/eye follow x 75' each direction, occasional LOB with cross-over steps/staggers; Vertical ball tosses to self with head/eye follow x 75'; Horizontal ball bounce passes to therapist with head/eye follow x 75' each direction; Forward ambulation with eyes closed x 75', pt frequently veers to the right and has to open eyes to reset; Retro ambulation with eyes open x 75';   Pt educated throughout session about proper posture and technique with exercises. Improved exercise technique, movement at target joints, use of target muscles after min to mod verbal, visual, tactile cues.      Pt demonstrates excellent motivation during session today. Balance progressed to harder exercises utilizing dynamic activities such as ball tosses/bounces  during ambulation as well as eyes closed walking and retro ambulation. She is able to complete all exercises as instructed today. Session focused on both balance and strength exercises however more focus on balance today. She does stagger considerably with horizontal head turns during forward ambulation ball tosses to therapist in the hallway. Pt will benefit from PT services to address deficits in strength and balance in order to return to full function at home.                          PT Short Term Goals - 05/16/19 1429      PT SHORT TERM GOAL #1   Title Pt will be independent with initial HEP in order to improve strength and balance in order to decrease fall risk and improve function at home and work.    Baseline administered on eval 4/21    Time 4    Period Weeks    Status New    Target Date 06/13/19             PT Long Term Goals - 08/06/19 1524      PT LONG TERM GOAL #1   Title Pt will be independent with HEP in order to improve strength and balance in order to decrease fall risk and improve function at home and work.    Time 8    Period Weeks    Status Partially Met    Target Date 09/04/19      PT LONG TERM GOAL #2   Title Pt will improve DGI by at least 6 points in order to demonstrate significant improvement in balance and decreased risk for falls.    Baseline 6/24, 06/21/19=12/24, 08/06/19- 15/24    Time 8    Period Weeks    Status Partially Met    Target Date 09/04/19      PT LONG TERM GOAL #3   Title Pt will improve BERG by at least 3 points in order to demonstrate clinically significant improvement in balance.    Baseline 34/56,  5/27= 41/56, 08/06/19= 46/56    Time 8    Period Weeks    Status Partially Met    Target Date 09/04/19      PT LONG TERM GOAL #4   Title Pt will decrease 5TSTS by at least 3 seconds and no UE support in order to demonstrate clinically significant improvement in LE strength.    Baseline 17 seconds, bilateral hand  support, 06/21/19= 15 sec, 08/06/19 13.21 sec    Time 8    Period Weeks    Status Partially Met    Target Date 09/04/19      PT LONG TERM GOAL #5   Title Pt will improve 10 MWT to  at least 1 m/s to indicate improved community ambulation and safety.    Baseline .43 ms/ (quick pace) 06/21/19= .53 m/sec, 08/06/19=. 92 m/sec    Time 8    Status Partially Met    Target Date 09/04/19      Additional Long Term Goals   Additional Long Term Goals Yes      PT LONG TERM GOAL #6   Title Patients FOTO score will improve to show functional improvements.    Time 8    Period Weeks    Status New    Target Date 09/04/19                 Plan - 08/20/19 1513    Clinical Impression Statement Pt demonstrates excellent motivation during session today. Balance progressed to harder exercises utilizing dynamic activities such as ball tosses/bounces during ambulation as well as eyes closed walking and retro ambulation. She is able to complete all exercises as instructed today. Session focused on both balance and strength exercises however more focus on balance today. She does stagger considerably with horizontal head turns during forward ambulation ball tosses to therapist in the hallway. Pt will benefit from PT services to address deficits in strength and balance in order to return to full function at home.    Personal Factors and Comorbidities Age;Comorbidity 3+    Comorbidities GERD, previous falls, HTN, OA, RA    Examination-Activity Limitations Squat;Stairs;Lift;Bed Mobility;Stand;Reach Overhead;Dressing;Carry;Caring for Others;Transfers    Examination-Participation Restrictions Laundry;Shop;Cleaning;Medication Management;Community Activity;Driving;Yard Work;Meal Prep    Stability/Clinical Decision Making Stable/Uncomplicated    Rehab Potential Good    PT Frequency 2x / week    PT Duration 8 weeks    PT Treatment/Interventions ADLs/Self Care Home Management;Canalith  Repostioning;Cryotherapy;Ultrasound;Traction;Moist Heat;Electrical Stimulation;DME Instruction;Gait training;Stair training;Balance training;Therapeutic exercise;Therapeutic activities;Functional mobility training;Neuromuscular re-education;Patient/family education;Passive range of motion;Energy conservation;Vestibular;Taping;Splinting;Spinal Manipulations;Joint Manipulations    PT Next Visit Plan address balance/strength/ambulation    PT Home Exercise Plan [email or web address]    Consulted and Agree with Plan of Care Patient           Patient will benefit from skilled therapeutic intervention in order to improve the following deficits and impairments:  Abnormal gait, Decreased balance, Decreased endurance, Decreased mobility, Difficulty walking, Decreased activity tolerance, Decreased strength, Postural dysfunction  Visit Diagnosis: Other abnormalities of gait and mobility  Muscle weakness (generalized)     Problem List Patient Active Problem List   Diagnosis Date Noted  . Lump of left thigh 05/07/2019  . Poor balance 05/07/2019  . Vestibular dizziness 03/31/2018  . Hip fracture, right ([place]) 02/27/2018  . Pedal edema 09/28/2017  . Fall at home 09/28/2017  . Prediabetes 11/30/2016  . Toenail fungus 10/03/2015  . At moderate risk for fall 09/05/2015  . Cervical spondylosis without myelopathy 08/05/2015  . Routine general medical examination at a health care facility 08/27/2014  . Colon cancer screening 08/27/2014  . Fall against object 08/15/2014  . Encounter for Medicare annual wellness exam 07/25/2013  . Varicosities of leg 05/23/2012  . Abnormal ultrasound of thyroid gland 03/01/2011  . Hyperlipidemia 06/11/2008  . Allergic rhinitis 03/29/2008  . Essential hypertension, benign 12/26/2006  . GERD 12/26/2006  . Rheumatoid arthritis ([place]) 12/26/2006  . Osteoporosis 12/26/2006  . URINARY INCONTINENCE 12/26/2006   Phillips Grout PT, DPT, GCS  Preslea Rhodus 08/20/2019, 4:16 PM  [place] MAIN [facility] SERVICES [address] [place], [place], [address] Phone: [phone]   Fax:  [phone]  Name: Sierra Bentley  MRN: [id number] Date of Birth: February 13, 1933

## 2019-08-22 ENCOUNTER — Other Ambulatory Visit: Payer: Self-pay

## 2019-08-22 ENCOUNTER — Ambulatory Visit: Payer: Medicare PPO

## 2019-08-22 DIAGNOSIS — R2689 Other abnormalities of gait and mobility: Secondary | ICD-10-CM

## 2019-08-22 DIAGNOSIS — M6281 Muscle weakness (generalized): Secondary | ICD-10-CM

## 2019-08-22 NOTE — Therapy (Signed)
[place] MAIN [facility] SERVICES [address] [place], [place], [address] Phone: [phone]   Fax:  [phone]  Physical Therapy Treatment  Patient Details  Name: Sierra Bentley MRN: [id number] Date of Birth: [birth year] No data recorded  Encounter Date: 08/22/2019   PT End of Session - 08/23/19 0905    Visit Number 24    Number of Visits 16    Date for PT Re-Evaluation 09/04/19    Authorization Type Medicare, eval 9/45/0388, cert 09/21/32    PT Start Time 1430    PT Stop Time 1514    PT Time Calculation (min) 44 min    Equipment Utilized During Treatment Gait belt    Activity Tolerance Patient tolerated treatment well    Behavior During Therapy WFL for tasks assessed/performed           Past Medical History:  Diagnosis Date  . Allergic rhinitis, cause unspecified   . Cramp of limb   . Diverticulosis   . Epistaxis   . GERD (gastroesophageal reflux disease)   . HTN (hypertension)   . Osteopenia   . Other and unspecified hyperlipidemia   . Other malaise and fatigue   . Rheumatoid arthritis(714.0)   . Urine incontinence     Past Surgical History:  Procedure Laterality Date  . BREAST BIOPSY  2/11   fibrocystic change  . BREAST BIOPSY  8/11   fibrocystic change  . COLONOSCOPY  2003  . CYSTOSCOPY  2006  . MOLE REMOVAL     rectal  . TONSILLECTOMY      There were no vitals filed for this visit.   Subjective Assessment - 08/23/19 0904    Subjective Patient reports no falls since last session, was busy today trying to get her husband ready for the day.    Pertinent History Patient is 86 yo female that  was referred to PT for strength and balance. PMH of fall abut a year ago and pelvic fx and R hip fx, GERD, HTN, OA, osteopenia, RA. Pt is husbands caregiver, performs all household tasks, independent (will walk around house without [facility]). Pt does have a history of visual issues. Patient reported that she falls, especially with  head turns, tends to fall backwards. Pt does complain of dizziness.    Limitations Standing;Walking;House hold activities    How long can you sit comfortably? NA    How long can you walk comfortably? 83mns (guesstimate)    Diagnostic tests NA    Patient Stated Goals not fall, walk better    Currently in Pain? No/denies             Treatment:      Standing with CGA next to support surface:  Airex pad: horizontal head turns 30 seconds scanning room 10x ; cueing for arc of motion  Airex pad: vertical head turns 30 seconds, cueing for arc of motion, noticeable sway with upward gaze increasing demand on ankle righting reaction musculature Airex pad: one foot on 6" step one foot on airex pad, hold position for 60 seconds, switch legs, 2x each LE;  Speed ladder: one foot in each square; 8x length of // bars; no UE support, cues for arm swing to normalize gait mechanics   ambulate around edge of PT gym with horizontal and vertical head turns to scan for cones, once finding cones squatting down or reaching up to grab cone .  Second attempt with dual task of counting down by 5 from 100.  In hallway; close CGA no UE support/AD  Horizontal head turn; two near LOB with increased weaving  Vertical head turn; one near LOB with increased scissoring  4 way head turn; very challenging, requires min A x 3 for retaining COM [place] x4 x 20 ft max cues for sequencing   GTB ER/IR with ball between knees 15x each side STS with airex pad under feet: 10x with occasional Min A        Pt educated throughout session about proper posture and technique with exercises. Improved exercise technique, movement at target joints, use of target muscles after min to mod verbal, visual, tactile cues.                         PT Education - 08/23/19 0905    Education Details exercise tchnique, body mechanics, dual task    Person(s) Educated Patient    Methods Demonstration;Tactile cues;Verbal  cues;Explanation    Comprehension Verbalized understanding;Returned demonstration;Verbal cues required;Tactile cues required            PT Short Term Goals - 05/16/19 1429      PT SHORT TERM GOAL #1   Title Pt will be independent with initial HEP in order to improve strength and balance in order to decrease fall risk and improve function at home and work.    Baseline administered on eval 4/21    Time 4    Period Weeks    Status New    Target Date 06/13/19             PT Long Term Goals - 08/06/19 1524      PT LONG TERM GOAL #1   Title Pt will be independent with HEP in order to improve strength and balance in order to decrease fall risk and improve function at home and work.    Time 8    Period Weeks    Status Partially Met    Target Date 09/04/19      PT LONG TERM GOAL #2   Title Pt will improve DGI by at least 6 points in order to demonstrate significant improvement in balance and decreased risk for falls.    Baseline 6/24, 06/21/19=12/24, 08/06/19- 15/24    Time 8    Period Weeks    Status Partially Met    Target Date 09/04/19      PT LONG TERM GOAL #3   Title Pt will improve BERG by at least 3 points in order to demonstrate clinically significant improvement in balance.    Baseline 34/56,  5/27= 41/56, 08/06/19= 46/56    Time 8    Period Weeks    Status Partially Met    Target Date 09/04/19      PT LONG TERM GOAL #4   Title Pt will decrease 5TSTS by at least 3 seconds and no UE support in order to demonstrate clinically significant improvement in LE strength.    Baseline 17 seconds, bilateral hand support, 06/21/19= 15 sec, 08/06/19 13.21 sec    Time 8    Period Weeks    Status Partially Met    Target Date 09/04/19      PT LONG TERM GOAL #5   Title Pt will improve 10 MWT to at least 1 m/s to indicate improved community ambulation and safety.    Baseline .43 ms/ (quick pace) 06/21/19= .53 m/sec, 08/06/19=. 92 m/sec    Time 8    Status Partially Met  Target  Date 09/04/19      Additional Long Term Goals   Additional Long Term Goals Yes      PT LONG TERM GOAL #6   Title Patients FOTO score will improve to show functional improvements.    Time 8    Period Weeks    Status New    Target Date 09/04/19                 Plan - 08/23/19 0909    Clinical Impression Statement Patient demonstrates excellent progression and tolerance of dynamic stability with mobility. Head movements as well as dual task continue to be an area of focus with patient challenged with multitask interventions.Ankle righting reactions are improving with decreased instability on unstable surfaces.  Pt will benefit from PT services to address deficits in strength and balance in order to return to full function at home.    Personal Factors and Comorbidities Age;Comorbidity 3+    Comorbidities GERD, previous falls, HTN, OA, RA    Examination-Activity Limitations Squat;Stairs;Lift;Bed Mobility;Stand;Reach Overhead;Dressing;Carry;Caring for Others;Transfers    Examination-Participation Restrictions Laundry;Shop;Cleaning;Medication Management;Community Activity;Driving;Yard Work;Meal Prep    Stability/Clinical Decision Making Stable/Uncomplicated    Rehab Potential Good    PT Frequency 2x / week    PT Duration 8 weeks    PT Treatment/Interventions ADLs/Self Care Home Management;Canalith Repostioning;Cryotherapy;Ultrasound;Traction;Moist Heat;Electrical Stimulation;DME Instruction;Gait training;Stair training;Balance training;Therapeutic exercise;Therapeutic activities;Functional mobility training;Neuromuscular re-education;Patient/family education;Passive range of motion;Energy conservation;Vestibular;Taping;Splinting;Spinal Manipulations;Joint Manipulations    PT Next Visit Plan address balance/strength/ambulation    PT Home Exercise Plan [email or web address]    Consulted and Agree with Plan of Care Patient           Patient will benefit from skilled therapeutic intervention in order  to improve the following deficits and impairments:  Abnormal gait, Decreased balance, Decreased endurance, Decreased mobility, Difficulty walking, Decreased activity tolerance, Decreased strength, Postural dysfunction  Visit Diagnosis: Other abnormalities of gait and mobility  Muscle weakness (generalized)     Problem List Patient Active Problem List   Diagnosis Date Noted  . Lump of left thigh 05/07/2019  . Poor balance 05/07/2019  . Vestibular dizziness 03/31/2018  . Hip fracture, right ([place]) 02/27/2018  . Pedal edema 09/28/2017  . Fall at home 09/28/2017  . Prediabetes 11/30/2016  . Toenail fungus 10/03/2015  . At moderate risk for fall 09/05/2015  . Cervical spondylosis without myelopathy 08/05/2015  . Routine general medical examination at a health care facility 08/27/2014  . Colon cancer screening 08/27/2014  . Fall against object 08/15/2014  . Encounter for Medicare annual wellness exam 07/25/2013  . Varicosities of leg 05/23/2012  . Abnormal ultrasound of thyroid gland 03/01/2011  . Hyperlipidemia 06/11/2008  . Allergic rhinitis 03/29/2008  . Essential hypertension, benign 12/26/2006  . GERD 12/26/2006  . Rheumatoid arthritis ([place]) 12/26/2006  . Osteoporosis 12/26/2006  . URINARY INCONTINENCE 12/26/2006   Janna Arch, PT, DPT   08/23/2019, 9:10 AM  [place] MAIN [facility] SERVICES [address] [place], [place], [address] Phone: [phone]   Fax:  [phone]  Name: Sierra Bentley MRN: [id number] Date of Birth: May 12, 1933

## 2019-08-23 ENCOUNTER — Ambulatory Visit: Payer: Medicare PPO | Admitting: Physical Therapy

## 2019-08-24 ENCOUNTER — Encounter: Payer: Medicare Other | Admitting: Obstetrics & Gynecology

## 2019-08-27 ENCOUNTER — Ambulatory Visit: Payer: Medicare PPO | Admitting: Physical Therapy

## 2019-08-27 DIAGNOSIS — H353211 Exudative age-related macular degeneration, right eye, with active choroidal neovascularization: Secondary | ICD-10-CM | POA: Diagnosis not present

## 2019-08-28 ENCOUNTER — Ambulatory Visit: Payer: Medicare PPO | Admitting: Physical Therapy

## 2019-08-29 ENCOUNTER — Other Ambulatory Visit: Payer: Self-pay

## 2019-08-29 ENCOUNTER — Ambulatory Visit: Payer: Medicare PPO | Attending: Family Medicine | Admitting: Physical Therapy

## 2019-08-29 DIAGNOSIS — M6281 Muscle weakness (generalized): Secondary | ICD-10-CM

## 2019-08-29 DIAGNOSIS — R2689 Other abnormalities of gait and mobility: Secondary | ICD-10-CM | POA: Diagnosis not present

## 2019-08-29 NOTE — Therapy (Signed)
[place] MAIN [facility] SERVICES [address] [place], [place], [address] Phone: [phone]   Fax:  [phone]  Physical Therapy Treatment  Patient Details  Name: Sierra Bentley MRN: [id number] Date of Birth: Apr 12, 1933 No data recorded  Encounter Date: 08/29/2019   PT End of Session - 08/29/19 1536    Visit Number 25    Number of Visits 16    Date for PT Re-Evaluation 09/04/19    Authorization Type Medicare, eval 9/89/2119, cert 05/12/4079    PT Start Time 0300    PT Stop Time 0345    PT Time Calculation (min) 45 min    Equipment Utilized During Treatment Gait belt    Activity Tolerance Patient tolerated treatment well    Behavior During Therapy WFL for tasks assessed/performed           Past Medical History:  Diagnosis Date   Allergic rhinitis, cause unspecified    Cramp of limb    Diverticulosis    Epistaxis    GERD (gastroesophageal reflux disease)    HTN (hypertension)    Osteopenia    Other and unspecified hyperlipidemia    Other malaise and fatigue    Rheumatoid arthritis(714.0)    Urine incontinence     Past Surgical History:  Procedure Laterality Date   BREAST BIOPSY  2/11   fibrocystic change   BREAST BIOPSY  8/11   fibrocystic change   COLONOSCOPY  2003   CYSTOSCOPY  2006   MOLE REMOVAL     rectal   TONSILLECTOMY      There were no vitals filed for this visit.   Subjective Assessment - 08/29/19 1536    Subjective Patient reports no falls since last session, was busy today trying to get her husband ready for the day.    Pertinent History Patient is 86 yo female that  was referred to PT for strength and balance. PMH of fall abut a year ago and pelvic fx and R hip fx, GERD, HTN, OA, osteopenia, RA. Pt is husbands caregiver, performs all household tasks, independent (will walk around house without [facility]). Pt does have a history of visual issues. Patient reported that she falls, especially with  head turns, tends to fall backwards. Pt does complain of dizziness.    Limitations Standing;Walking;House hold activities    How long can you sit comfortably? NA    How long can you walk comfortably? 23mns (guesstimate)    Diagnostic tests NA    Patient Stated Goals not fall, walk better           Treatment: Octane fitness x 5 mins L 2    Neuromuscular Re-education   Tandem gait on 1/2 foam without UE support x 2 lengths Side stepping on 1/2 foam without UE support x 2 lengths Heel/toe raises without UE support 3s hold x 10 each 1/2 foam roll balance with flat side up 30s x 2 reps 1/2 foam roll balance with flat side down 30s x 2 reps 1/2 foam roll tandem balance alternating forward LE 30s x 2 each LE forward Lateral side steps from foam to 6 inch stool left and right x 15 Backwards stepping from foam to 6 inch stool x 15  Leg press 25 lbs x 20 x 3  Leg press 25 lbs heel raises x 20 x 3       Pt educated throughout session about proper posture and technique with exercises. Improved exercise technique, movement at target joints, use  of target muscles after min to mod verbal, visual, tactile cues. CGA and Min to mod verbal cues used throughout with increased in postural sway and LOB most seen with narrow base of support and while on uneven surfaces.                       PT Education - 08/29/19 1536    Education Details HEP    Person(s) Educated Patient    Methods Explanation    Comprehension Verbalized understanding            PT Short Term Goals - 05/16/19 1429      PT SHORT TERM GOAL #1   Title Pt will be independent with initial HEP in order to improve strength and balance in order to decrease fall risk and improve function at home and work.    Baseline administered on eval 4/21    Time 4    Period Weeks    Status New    Target Date 06/13/19             PT Long Term Goals - 08/06/19 1524      PT LONG TERM GOAL #1   Title Pt will be  independent with HEP in order to improve strength and balance in order to decrease fall risk and improve function at home and work.    Time 8    Period Weeks    Status Partially Met    Target Date 09/04/19      PT LONG TERM GOAL #2   Title Pt will improve DGI by at least 6 points in order to demonstrate significant improvement in balance and decreased risk for falls.    Baseline 6/24, 06/21/19=12/24, 08/06/19- 15/24    Time 8    Period Weeks    Status Partially Met    Target Date 09/04/19      PT LONG TERM GOAL #3   Title Pt will improve BERG by at least 3 points in order to demonstrate clinically significant improvement in balance.    Baseline 34/56,  5/27= 41/56, 08/06/19= 46/56    Time 8    Period Weeks    Status Partially Met    Target Date 09/04/19      PT LONG TERM GOAL #4   Title Pt will decrease 5TSTS by at least 3 seconds and no UE support in order to demonstrate clinically significant improvement in LE strength.    Baseline 17 seconds, bilateral hand support, 06/21/19= 15 sec, 08/06/19 13.21 sec    Time 8    Period Weeks    Status Partially Met    Target Date 09/04/19      PT LONG TERM GOAL #5   Title Pt will improve 10 MWT to at least 1 m/s to indicate improved community ambulation and safety.    Baseline .43 ms/ (quick pace) 06/21/19= .53 m/sec, 08/06/19=. 92 m/sec    Time 8    Status Partially Met    Target Date 09/04/19      Additional Long Term Goals   Additional Long Term Goals Yes      PT LONG TERM GOAL #6   Title Patients FOTO score will improve to show functional improvements.    Time 8    Period Weeks    Status New    Target Date 09/04/19                 Plan - 08/29/19 1537  Clinical Impression Statement Patient instructed in intermediate balance and coordination exercise. Patient required mod VCs and min A for gait to improve weight shift and postural control. Patients would benefit from additional skilled PT intervention to improve  balance/gait safety and reduce fall risk.   Personal Factors and Comorbidities Age;Comorbidity 3+    Comorbidities GERD, previous falls, HTN, OA, RA    Examination-Activity Limitations Squat;Stairs;Lift;Bed Mobility;Stand;Reach Overhead;Dressing;Carry;Caring for Others;Transfers    Examination-Participation Restrictions Laundry;Shop;Cleaning;Medication Management;Community Activity;Driving;Yard Work;Meal Prep    Stability/Clinical Decision Making Stable/Uncomplicated    Rehab Potential Good    PT Frequency 2x / week    PT Duration 8 weeks    PT Treatment/Interventions ADLs/Self Care Home Management;Canalith Repostioning;Cryotherapy;Ultrasound;Traction;Moist Heat;Electrical Stimulation;DME Instruction;Gait training;Stair training;Balance training;Therapeutic exercise;Therapeutic activities;Functional mobility training;Neuromuscular re-education;Patient/family education;Passive range of motion;Energy conservation;Vestibular;Taping;Splinting;Spinal Manipulations;Joint Manipulations    PT Next Visit Plan address balance/strength/ambulation    PT Home Exercise Plan [email or web address]    Consulted and Agree with Plan of Care Patient           Patient will benefit from skilled therapeutic intervention in order to improve the following deficits and impairments:  Abnormal gait, Decreased balance, Decreased endurance, Decreased mobility, Difficulty walking, Decreased activity tolerance, Decreased strength, Postural dysfunction  Visit Diagnosis: Other abnormalities of gait and mobility  Muscle weakness (generalized)     Problem List Patient Active Problem List   Diagnosis Date Noted   Lump of left thigh 05/07/2019   Poor balance 05/07/2019   Vestibular dizziness 03/31/2018   Hip fracture, right ([place]) 02/27/2018   Pedal edema 09/28/2017   Fall at home 09/28/2017   Prediabetes 11/30/2016   Toenail fungus 10/03/2015   At moderate risk for fall 09/05/2015   Cervical spondylosis without  myelopathy 08/05/2015   Routine general medical examination at a health care facility 08/27/2014   Colon cancer screening 08/27/2014   Fall against object 08/15/2014   Encounter for Medicare annual wellness exam 07/25/2013   Varicosities of leg 05/23/2012   Abnormal ultrasound of thyroid gland 03/01/2011   Hyperlipidemia 06/11/2008   Allergic rhinitis 03/29/2008   Essential hypertension, benign 12/26/2006   GERD 12/26/2006   Rheumatoid arthritis ([place]) 12/26/2006   Osteoporosis 12/26/2006   URINARY INCONTINENCE 12/26/2006    Alanson Puls, PT DPT 08/29/2019, 3:38 PM  [place] MAIN [facility] SERVICES [address] [place], [place], [address] Phone: [phone]   Fax:  [phone]  Name: CAROLE DONER MRN: [id number] Date of Birth: Mar 25, 1933

## 2019-08-30 ENCOUNTER — Encounter: Payer: Self-pay | Admitting: Obstetrics & Gynecology

## 2019-08-30 ENCOUNTER — Ambulatory Visit: Payer: Medicare PPO | Admitting: Obstetrics & Gynecology

## 2019-08-30 VITALS — BP 116/72 | Ht 63.5 in | Wt 117.8 lb

## 2019-08-30 DIAGNOSIS — R2689 Other abnormalities of gait and mobility: Secondary | ICD-10-CM

## 2019-08-30 DIAGNOSIS — Z780 Asymptomatic menopausal state: Secondary | ICD-10-CM

## 2019-08-30 DIAGNOSIS — Z01419 Encounter for gynecological examination (general) (routine) without abnormal findings: Secondary | ICD-10-CM | POA: Diagnosis not present

## 2019-08-30 DIAGNOSIS — S72001S Fracture of unspecified part of neck of right femur, sequela: Secondary | ICD-10-CM

## 2019-08-30 DIAGNOSIS — Z8781 Personal history of (healed) traumatic fracture: Secondary | ICD-10-CM

## 2019-08-30 DIAGNOSIS — M810 Age-related osteoporosis without current pathological fracture: Secondary | ICD-10-CM | POA: Diagnosis not present

## 2019-08-30 NOTE — Progress Notes (Signed)
Sierra Bentley 1933/07/23 [id number]   History:    86 y.o. G0 Married.  Husband is 61 yo with mild Dementia and Kidney Failure on Dialysis.  Living together in their house.  RP:  Established patient presenting for annual gyn exam   HPI: Menopause, well on no hormone replacement therapy. Abstinent. No postmenopausal bleeding. No pelvic pain. Frequent urinary incontinence sometimes with effort and as other times just a feeling of urgency. Breasts normal. Body mass index 20.54. Physically active. Fasting health labs with family physician. Osteoporosis previously and Pelvic bone fracture after a fall 02/2018.  BD 07/2018 Osteopenia.  Taking vitamin D supplements, calcium rich nutrition and doing regular weightbearing physical activities.  Past medical history,surgical history, family history and social history were all reviewed and documented in the EPIC chart.  Gynecologic History No LMP recorded. Patient is postmenopausal.  Obstetric History OB History  Gravida Para Term Preterm AB Living  0 0 0 0 0 0  SAB TAB Ectopic Multiple Live Births  0 0 0 0 0  Obstetric Comments  HAS 2 STEP SONS      ROS: A ROS was performed and pertinent positives and negatives are included in the history.  GENERAL: No fevers or chills. HEENT: No change in vision, no earache, sore throat or sinus congestion. NECK: No pain or stiffness. CARDIOVASCULAR: No chest pain or pressure. No palpitations. PULMONARY: No shortness of breath, cough or wheeze. GASTROINTESTINAL: No abdominal pain, nausea, vomiting or diarrhea, melena or bright red blood per rectum. GENITOURINARY: No urinary frequency, urgency, hesitancy or dysuria. MUSCULOSKELETAL: No joint or muscle pain, no back pain, no recent trauma. DERMATOLOGIC: No rash, no itching, no lesions. ENDOCRINE: No polyuria, polydipsia, no heat or cold intolerance. No recent change in weight. HEMATOLOGICAL: No anemia or easy bruising or bleeding. NEUROLOGIC: No headache,  seizures, numbness, tingling or weakness. PSYCHIATRIC: No depression, no loss of interest in normal activity or change in sleep pattern.     Exam:   BP 116/72   Ht 5' 3.5" (1.613 m)   Wt 117 lb 12.8 oz (53.4 kg)   BMI 20.54 kg/m   Body mass index is 20.54 kg/m.  General appearance : Well developed well nourished female. No acute distress HEENT: Eyes: no retinal hemorrhage or exudates,  Neck supple, trachea midline, no carotid bruits, no thyroidmegaly Lungs: Clear to auscultation, no rhonchi or wheezes, or rib retractions  Heart: Regular rate and rhythm, no murmurs or gallops Breast:Examined in sitting and supine position were symmetrical in appearance, no palpable masses or tenderness,  no skin retraction, no nipple inversion, no nipple discharge, no skin discoloration, no axillary or supraclavicular lymphadenopathy Abdomen: no palpable masses or tenderness, no rebound or guarding Extremities: no edema or skin discoloration or tenderness  Pelvic: Vulva: Normal             Vagina: No gross lesions or discharge  Cervix: No gross lesions or discharge  Uterus  AV, normal size, shape and consistency, non-tender and mobile  Adnexa  Without masses or tenderness  Anus: Normal   Assessment/Plan:  86 y.o. female for annual exam   1. Well female exam with routine gynecological exam Normal gynecologic exam and menopause.  No indication to repeat a Pap test.  Breast exam normal.  Last screening mammogram October 2020.  Colonoscopy 2013.  Health labs with family physician.  Body mass index 20.54.  2. Postmenopause Well on no hormone replacement therapy.  No postmenopausal bleeding.  3. Age-related osteoporosis without  current pathological fracture Repeat a bone density here now.  Vitamin D supplements, calcium intake of 1200 to 1500 mg daily and regular weightbearing physical activities to continue. - DG Bone Density; Future  4. Closed fracture of right hip, sequela I had made the  decision not to be on bone treatment.  We will reassess the need for treatment per bone density now. - DG Bone Density; Future  5. Poor balance We will recheck bone density now.  Princess Bruins MD, 11:17 AM 08/30/2019

## 2019-09-03 ENCOUNTER — Ambulatory Visit: Payer: Medicare PPO | Admitting: Physical Therapy

## 2019-09-03 ENCOUNTER — Other Ambulatory Visit: Payer: Self-pay

## 2019-09-03 ENCOUNTER — Ambulatory Visit: Payer: Medicare PPO

## 2019-09-03 DIAGNOSIS — R2689 Other abnormalities of gait and mobility: Secondary | ICD-10-CM

## 2019-09-03 DIAGNOSIS — M6281 Muscle weakness (generalized): Secondary | ICD-10-CM | POA: Diagnosis not present

## 2019-09-03 NOTE — Patient Instructions (Addendum)
Access Code: [email or web address] URL: [email or web address] Date: 09/03/2019 Prepared by: Roxana Hires  Exercises Seated March - 1 x daily - 7 x weekly - 2 sets - 10 reps Seated Long Arc Quad - 1 x daily - 7 x weekly - 2 sets - 10 reps Seated Heel Toe Raises - 1 x daily - 7 x weekly - 2 sets - 10 reps Sit to Stand with Armchair - 1 x daily - 7 x weekly - 2 sets - 10 reps Standing Romberg to 1/2 Tandem Stance - 1 x daily - 7 x weekly - 3 x 30s with each foot forward hold Standing March with Unilateral Counter Support - 1 x daily - 7 x weekly - 2 sets - 10 reps - 3s hold

## 2019-09-03 NOTE — Therapy (Signed)
[facility] [address] [place], [place], [address] Phone: [phone]   Fax:  [phone]  Physical Therapy Treatment/Discharge  Patient Details  Name: Sierra Bentley MRN: [id number] Date of Birth: May 02, 1933 No data recorded  Encounter Date: 09/03/2019   PT End of Session - 09/03/19 1614    Visit Number 26    Number of Visits 16    Date for PT Re-Evaluation 09/04/19    Authorization Type Medicare, eval 1/60/7371, cert 0/62/6948    PT Start Time 1605    PT Stop Time 1648    PT Time Calculation (min) 43 min    Equipment Utilized During Treatment Gait belt    Activity Tolerance Patient tolerated treatment well    Behavior During Therapy WFL for tasks assessed/performed           Past Medical History:  Diagnosis Date  . Allergic rhinitis, cause unspecified   . Cramp of limb   . Diverticulosis   . Epistaxis   . GERD (gastroesophageal reflux disease)   . HTN (hypertension)   . Osteopenia   . Other and unspecified hyperlipidemia   . Other malaise and fatigue   . Rheumatoid arthritis(714.0)   . Urine incontinence     Past Surgical History:  Procedure Laterality Date  . BREAST BIOPSY  2/11   fibrocystic change  . BREAST BIOPSY  8/11   fibrocystic change  . COLONOSCOPY  2003  . CYSTOSCOPY  2006  . MOLE REMOVAL     rectal  . TONSILLECTOMY      There were no vitals filed for this visit.   Subjective Assessment - 09/03/19 1613    Subjective Pt reports she is doing alright today but feels like her eyes are "a little glazed" upon arrival. Patient reports no falls since last session and no general health changes. She is not performing her HEP. No specific questions or concerns upon arrival.    Pertinent History Patient is 86 yo female that  was referred to PT for strength and balance. PMH of fall abut a year ago and pelvic fx and R hip fx, GERD, HTN, OA, osteopenia, RA. Pt is husbands caregiver, performs all  household tasks, independent (will walk around house without [facility]). Pt does have a history of visual issues. Patient reported that she falls, especially with head turns, tends to fall backwards. Pt does complain of dizziness.    Limitations Standing;Walking;House hold activities    How long can you sit comfortably? NA    How long can you walk comfortably? 60mns (guesstimate)    Diagnostic tests NA    Patient Stated Goals not fall, walk better    Currently in Pain? No/denies              [facility] - 09/03/19 1632      Standardized Balance Assessment   Standardized Balance Assessment Berg Balance Test;Dynamic Gait Index      Berg Balance Test   Sit to Stand Able to stand without using hands and stabilize independently    Standing Unsupported Able to stand safely 2 minutes    Sitting with Back Unsupported but Feet Supported on Floor or Stool Able to sit safely and securely 2 minutes    Stand to Sit Sits safely with minimal use of hands    Transfers Able to transfer safely, minor use of hands    Standing Unsupported with Eyes Closed Able to stand 10 seconds safely  Standing Unsupported with Feet Together Able to place feet together independently and stand 1 minute safely    From Standing, Reach Forward with Outstretched Arm Can reach confidently >25 cm (10")    From Standing Position, Pick up Object from [place] to pick up shoe safely and easily    From Standing Position, Turn to Look Behind Over each Shoulder Looks behind one side only/other side shows less weight shift    Turn 360 Degrees Able to turn 360 degrees safely in 4 seconds or less    Standing Unsupported, Alternately Place Feet on Step/Stool Able to stand independently and safely and complete 8 steps in 20 seconds    Standing Unsupported, One Foot in Front Able to plae foot ahead of the other independently and hold 30 seconds    Standing on One Leg Able to lift leg independently and hold equal to or more than 3  seconds    Total Score 52      Dynamic Gait Index   Level Surface Mild Impairment    Change in Gait Speed Normal    Gait with Horizontal Head Turns Normal    Gait with Vertical Head Turns Normal    Gait and Pivot Turn Normal    Step Over Obstacle Mild Impairment    Step Around Obstacles Normal    Steps Moderate Impairment    Total Score 20             TREATMENT   Neuromuscular Re-education  NuStep L2-3 x 5 minutes for warm-up during history (2 minutes unbilled); Outcome measures updated with patient including BERG (52/56), DGI (20/24), 5TSTS (10.7s), and 41mgait speed (self-selected 12.4s = 0.830m, fastest 9.8s = 1.0278m; Pt completed FOTO questionnaire and scored a 61 Semitandem balance without UE support x 30s each; Slow marches for single leg balance 2-3s hold each x 10 BLE; Reviewed HEP with patient;   Ther-ex  Seated marches with red tband x 10 BLE; Seated heel/toe raises x 10 BLE; Seated LAQ x 10 BLE;   Patient performed with instruction, verbal cues, tactile cues of therapist: goal:increase tissue extensibility, promote proper posture, improve mobility   Updated outcome measures and goals with patient today. She demonstrates significant improvement in all of her tests including her BERG, DGI, 6m56mt speed, 5TSTS, and FOTO score. She remains inconsistent with her HEP. At this point patient has met most of her goals and has some upcoming commitments. She is ready for discharge. Reviewed HEP with patient today and provided handout. Pt encouraged to return for additional therapy if needs arise.                      PT Short Term Goals - 09/03/19 1617      PT SHORT TERM GOAL #1   Title Pt will be independent with initial HEP in order to improve strength and balance in order to decrease fall risk and improve function at home and work.    Baseline administered on eval 4/21, 09/03/19: Pt is aware of all her exercises but is inconsistent with  performance    Time 4    Period Weeks    Status Partially Met             PT Long Term Goals - 09/03/19 1705      PT LONG TERM GOAL #1   Title Pt will be independent with HEP in order to improve strength and balance in order to decrease fall risk and  improve function at home and work.    Time 8    Period Weeks    Status Partially Met      PT LONG TERM GOAL #2   Title Pt will improve DGI by at least 6 points in order to demonstrate significant improvement in balance and decreased risk for falls.    Baseline 6/24, 06/21/19=12/24, 08/06/19- 15/24; 09/03/19: 20/24    Time 8    Period Weeks    Status Achieved      PT LONG TERM GOAL #3   Title Pt will improve BERG by at least 3 points in order to demonstrate clinically significant improvement in balance.    Baseline 34/56,  5/27= 41/56, 08/06/19= 46/56; 09/03/19: 52/56    Time 8    Period Weeks    Status Achieved      PT LONG TERM GOAL #4   Title Pt will decrease 5TSTS by at least 3 seconds and no UE support in order to demonstrate clinically significant improvement in LE strength.    Baseline 17 seconds, bilateral hand support, 06/21/19= 15 sec, 08/06/19 13.21 sec; 09/03/19: 10.7s    Time 8    Period Weeks    Status Achieved      PT LONG TERM GOAL #5   Title Pt will improve 10 MWT to at least 1 m/s to indicate improved community ambulation and safety.    Baseline .43 ms/ (quick pace) 06/21/19= .53 m/sec, 08/06/19=. 92 m/sec; 09/03/19: 1.02 m/s    Time 8    Status Achieved      PT LONG TERM GOAL #6   Title Patients FOTO score will improve to show functional improvements.    Baseline 09/03/19: 61    Time 8    Period Weeks    Status Achieved                 Plan - 09/03/19 1617    Clinical Impression Statement Updated outcome measures and goals with patient today. She demonstrates significant improvement in all of her tests including her BERG, DGI, 69mgait speed, 5TSTS, and FOTO score. She remains inconsistent with her HEP. At  this point patient has met most of her goals and has some upcoming commitments. She is ready for discharge. Reviewed HEP with patient today and provided handout. Pt encouraged to return for additional therapy if needs arise.    Personal Factors and Comorbidities Age;Comorbidity 3+    Comorbidities GERD, previous falls, HTN, OA, RA    Examination-Activity Limitations Squat;Stairs;Lift;Bed Mobility;Stand;Reach Overhead;Dressing;Carry;Caring for Others;Transfers    Examination-Participation Restrictions Laundry;Shop;Cleaning;Medication Management;Community Activity;Driving;Yard Work;Meal Prep    Stability/Clinical Decision Making Stable/Uncomplicated    Rehab Potential Good    PT Frequency 2x / week    PT Duration 8 weeks    PT Treatment/Interventions ADLs/Self Care Home Management;Canalith Repostioning;Cryotherapy;Ultrasound;Traction;Moist Heat;Electrical Stimulation;DME Instruction;Gait training;Stair training;Balance training;Therapeutic exercise;Therapeutic activities;Functional mobility training;Neuromuscular re-education;Patient/family education;Passive range of motion;Energy conservation;Vestibular;Taping;Splinting;Spinal Manipulations;Joint Manipulations    PT Next Visit Plan Discharge    PT Home Exercise Plan [email or web address]    Consulted and Agree with Plan of Care Patient           Patient will benefit from skilled therapeutic intervention in order to improve the following deficits and impairments:  Abnormal gait, Decreased balance, Decreased endurance, Decreased mobility, Difficulty walking, Decreased activity tolerance, Decreased strength, Postural dysfunction  Visit Diagnosis: Other abnormalities of gait and mobility  Muscle weakness (generalized)     Problem List Patient Active Problem List   Diagnosis Date  Noted  . Lump of left thigh 05/07/2019  . Poor balance 05/07/2019  . Vestibular dizziness 03/31/2018  . Hip fracture, right ([place]) 02/27/2018  . Pedal edema 09/28/2017  .  Fall at home 09/28/2017  . Prediabetes 11/30/2016  . Toenail fungus 10/03/2015  . At moderate risk for fall 09/05/2015  . Cervical spondylosis without myelopathy 08/05/2015  . Routine general medical examination at a health care facility 08/27/2014  . Colon cancer screening 08/27/2014  . Fall against object 08/15/2014  . Encounter for Medicare annual wellness exam 07/25/2013  . Varicosities of leg 05/23/2012  . Abnormal ultrasound of thyroid gland 03/01/2011  . Hyperlipidemia 06/11/2008  . Allergic rhinitis 03/29/2008  . Essential hypertension, benign 12/26/2006  . GERD 12/26/2006  . Rheumatoid arthritis ([place]) 12/26/2006  . Osteoporosis 12/26/2006  . URINARY INCONTINENCE 12/26/2006   Phillips Grout PT, DPT, GCS  Demario Faniel 09/03/2019, 5:12 PM  [facility] [address] [place], [place], [address] Phone: [phone]   Fax:  [phone]  Name: DENIS KOPPEL MRN: [id number] Date of Birth: 10-25-1933

## 2019-09-05 ENCOUNTER — Ambulatory Visit: Payer: Medicare PPO | Admitting: Physical Therapy

## 2019-09-10 ENCOUNTER — Ambulatory Visit: Payer: Medicare PPO

## 2019-09-11 ENCOUNTER — Encounter: Payer: Medicare PPO | Admitting: Dermatology

## 2019-09-12 ENCOUNTER — Ambulatory Visit: Payer: Medicare PPO

## 2019-09-21 DIAGNOSIS — H353122 Nonexudative age-related macular degeneration, left eye, intermediate dry stage: Secondary | ICD-10-CM | POA: Diagnosis not present

## 2019-09-21 DIAGNOSIS — H353211 Exudative age-related macular degeneration, right eye, with active choroidal neovascularization: Secondary | ICD-10-CM | POA: Diagnosis not present

## 2019-09-21 DIAGNOSIS — H35363 Drusen (degenerative) of macula, bilateral: Secondary | ICD-10-CM | POA: Diagnosis not present

## 2019-09-25 ENCOUNTER — Ambulatory Visit: Payer: Medicare PPO | Admitting: Physical Therapy

## 2019-09-27 ENCOUNTER — Ambulatory Visit: Payer: Medicare PPO | Admitting: Physical Therapy

## 2019-10-12 DIAGNOSIS — H353211 Exudative age-related macular degeneration, right eye, with active choroidal neovascularization: Secondary | ICD-10-CM | POA: Diagnosis not present

## 2019-10-12 DIAGNOSIS — H353120 Nonexudative age-related macular degeneration, left eye, stage unspecified: Secondary | ICD-10-CM | POA: Diagnosis not present

## 2019-10-30 DIAGNOSIS — H542X11 Low vision right eye category 1, low vision left eye category 1: Secondary | ICD-10-CM | POA: Diagnosis not present

## 2019-10-30 DIAGNOSIS — H353122 Nonexudative age-related macular degeneration, left eye, intermediate dry stage: Secondary | ICD-10-CM | POA: Diagnosis not present

## 2019-10-30 DIAGNOSIS — H353211 Exudative age-related macular degeneration, right eye, with active choroidal neovascularization: Secondary | ICD-10-CM | POA: Diagnosis not present

## 2019-11-06 ENCOUNTER — Ambulatory Visit: Payer: Medicare PPO | Admitting: Dermatology

## 2019-11-06 ENCOUNTER — Other Ambulatory Visit: Payer: Self-pay

## 2019-11-06 DIAGNOSIS — D485 Neoplasm of uncertain behavior of skin: Secondary | ICD-10-CM | POA: Diagnosis not present

## 2019-11-06 DIAGNOSIS — D1724 Benign lipomatous neoplasm of skin and subcutaneous tissue of left leg: Secondary | ICD-10-CM | POA: Diagnosis not present

## 2019-11-06 MED ORDER — MUPIROCIN 2 % EX OINT: TOPICAL_OINTMENT | CUTANEOUS | 0 refills | Status: DC

## 2019-11-06 NOTE — Progress Notes (Signed)
   Follow-Up Visit   Subjective  Sierra Bentley is a 86 y.o. female who presents for the following: surgery (Lipoma - of the left thigh, patient is here today for excision).  The following portions of the chart were reviewed this encounter and updated as appropriate:  Tobacco  Allergies  Meds  Problems  Med Hx  Surg Hx  Fam Hx     Review of Systems:  No other skin or systemic complaints except as noted in HPI or Assessment and Plan.  Objective  Well appearing patient in no apparent distress; mood and affect are within normal limits.  A focused examination was performed including the legs. Relevant physical exam findings are noted in the Assessment and Plan.  Objective  L prox ant lat thigh: Rubbery nodule   Assessment & Plan  Neoplasm of uncertain behavior of skin L prox ant lat thigh  Skin excision  Lesion length (cm):  3.2 Lesion width (cm):  3.2 Margin per side (cm):  0 Total excision diameter (cm):  3.2 Informed consent: discussed and consent obtained   Timeout: patient name, date of birth, surgical site, and procedure verified   Procedure prep:  Patient was prepped and draped in usual sterile fashion Prep type:  Isopropyl alcohol and povidone-iodine Anesthesia: the lesion was anesthetized in a standard fashion   Anesthetic:  1% lidocaine w/ epinephrine 1-100,000 buffered w/ 8.4% NaHCO3 Instrument used: #15 blade   Hemostasis achieved with: pressure   Hemostasis achieved with comment:  Electrocautery Outcome: patient tolerated procedure well with no complications   Post-procedure details: sterile dressing applied and wound care instructions given   Dressing type: bandage and pressure dressing    Skin repair Complexity:  Complex Final length (cm):  3 Informed consent: discussed and consent obtained   Timeout: patient name, date of birth, surgical site, and procedure verified   Procedure prep:  Patient was prepped and draped in usual sterile fashion Prep  type:  Povidone-iodine Anesthesia: the lesion was anesthetized in a standard fashion   Local anesthetic: 10.0 cc. Reason for type of repair: reduce tension to allow closure, reduce the risk of dehiscence, infection, and necrosis, reduce subcutaneous dead space and avoid a hematoma, allow closure of the large defect, preserve normal anatomy, preserve normal anatomical and functional relationships and enhance both functionality and cosmetic results   Undermining comment:  3.2 cm Subcutaneous layers (deep stitches):  Suture size:  3-0 Suture type: Vicryl (polyglactin 910)   Fine/surface layer approximation (top stitches):  Suture size:  3-0 Suture type: nylon   Stitches: simple running   Suture removal (days):  7 Hemostasis achieved with: suture and pressure Outcome: patient tolerated procedure well with no complications   Post-procedure details: sterile dressing applied and wound care instructions given   Dressing type: bandage and pressure dressing    mupirocin ointment (BACTROBAN) 2 %  Specimen 1 - Surgical pathology Differential Diagnosis: D48.5 r/o Lipoma vs other  Check Margins: No Rubbery nodule 3.2 cm   Start Mupirocin 2% ointment to aa QD.   Return in about 1 week (around 11/13/2019) for nurse visit - suture removal .  I, Rudell Cobb, CMA, am acting as scribe for Sarina Ser, MD .  Documentation: I have reviewed the above documentation for accuracy and completeness, and I agree with the above.  Sarina Ser, MD

## 2019-11-06 NOTE — Patient Instructions (Signed)

## 2019-11-07 ENCOUNTER — Encounter: Payer: Self-pay | Admitting: Dermatology

## 2019-11-07 ENCOUNTER — Telehealth: Payer: Self-pay

## 2019-11-07 NOTE — Telephone Encounter (Signed)
I left a message on patient's voicemail to return my call if she has any questions or concerns S/P surgery yesterday.

## 2019-11-09 ENCOUNTER — Telehealth: Payer: Self-pay

## 2019-11-09 NOTE — Telephone Encounter (Signed)
-----   Message from Ralene Bathe, MD sent at 11/08/2019  7:19 PM EDT ----- Skin (M), left prox ant lat thigh LIPOMA  Benign lipoma = fatty growth As suspected

## 2019-11-09 NOTE — Telephone Encounter (Signed)
Patient called and informed of biopsy results, patient verbalized understanding.  

## 2019-11-13 ENCOUNTER — Ambulatory Visit (INDEPENDENT_AMBULATORY_CARE_PROVIDER_SITE_OTHER): Payer: Medicare PPO | Admitting: Dermatology

## 2019-11-13 ENCOUNTER — Encounter: Payer: Self-pay | Admitting: Dermatology

## 2019-11-13 ENCOUNTER — Other Ambulatory Visit: Payer: Self-pay

## 2019-11-13 DIAGNOSIS — D1724 Benign lipomatous neoplasm of skin and subcutaneous tissue of left leg: Secondary | ICD-10-CM

## 2019-11-13 DIAGNOSIS — Z4802 Encounter for removal of sutures: Secondary | ICD-10-CM

## 2019-11-13 NOTE — Progress Notes (Signed)
   Follow-Up Visit   Subjective  Sierra Bentley is a 86 y.o. female who presents for the following: Follow-up (Post op - Lipoma of left prox ant lat thigh).  The following portions of the chart were reviewed this encounter and updated as appropriate:  Tobacco  Allergies  Meds  Problems  Med Hx  Surg Hx  Fam Hx     Review of Systems:  No other skin or systemic complaints except as noted in HPI or Assessment and Plan.  Objective  Well appearing patient in no apparent distress; mood and affect are within normal limits.  A focused examination was performed including left leg. Relevant physical exam findings are noted in the Assessment and Plan.  Objective  Left prox ant lat thigh: Healing excision site   Assessment & Plan  Lipoma of left lower extremity -status post excision doing well Left prox ant lat thigh  Wound cleansed, sutures removed, wound cleansed and steri strips applied. Discussed pathology results.  Return if symptoms worsen or fail to improve.   I, Ashok Cordia, CMA, am acting as scribe for Sarina Ser, MD .  Documentation: I have reviewed the above documentation for accuracy and completeness, and I agree with the above.  Sarina Ser, MD

## 2019-11-21 DIAGNOSIS — B351 Tinea unguium: Secondary | ICD-10-CM | POA: Diagnosis not present

## 2019-11-21 DIAGNOSIS — M79675 Pain in left toe(s): Secondary | ICD-10-CM | POA: Diagnosis not present

## 2019-11-21 DIAGNOSIS — M79674 Pain in right toe(s): Secondary | ICD-10-CM | POA: Diagnosis not present

## 2019-11-26 DIAGNOSIS — H04129 Dry eye syndrome of unspecified lacrimal gland: Secondary | ICD-10-CM | POA: Diagnosis not present

## 2019-11-26 DIAGNOSIS — H40003 Preglaucoma, unspecified, bilateral: Secondary | ICD-10-CM | POA: Diagnosis not present

## 2019-11-26 DIAGNOSIS — Z961 Presence of intraocular lens: Secondary | ICD-10-CM | POA: Diagnosis not present

## 2019-11-26 DIAGNOSIS — H353122 Nonexudative age-related macular degeneration, left eye, intermediate dry stage: Secondary | ICD-10-CM | POA: Diagnosis not present

## 2019-11-26 DIAGNOSIS — H18513 Endothelial corneal dystrophy, bilateral: Secondary | ICD-10-CM | POA: Diagnosis not present

## 2019-11-26 DIAGNOSIS — H353211 Exudative age-related macular degeneration, right eye, with active choroidal neovascularization: Secondary | ICD-10-CM | POA: Diagnosis not present

## 2019-12-03 ENCOUNTER — Other Ambulatory Visit: Payer: Self-pay | Admitting: Family Medicine

## 2019-12-05 ENCOUNTER — Telehealth: Payer: Self-pay | Admitting: Family Medicine

## 2019-12-05 DIAGNOSIS — R7303 Prediabetes: Secondary | ICD-10-CM

## 2019-12-05 DIAGNOSIS — E7800 Pure hypercholesterolemia, unspecified: Secondary | ICD-10-CM

## 2019-12-05 DIAGNOSIS — M810 Age-related osteoporosis without current pathological fracture: Secondary | ICD-10-CM

## 2019-12-05 DIAGNOSIS — I10 Essential (primary) hypertension: Secondary | ICD-10-CM

## 2019-12-05 NOTE — Telephone Encounter (Signed)
-----   Message from Cloyd Stagers, RT sent at 11/22/2019 10:21 AM EDT ----- Regarding: Lab Orders for Thursday 11.11.2021 Please place lab orders for Thursday 11.11.2021, office visit for physical on Monday 11.22.2021 Thank you, Dyke Maes RT(R)

## 2019-12-06 ENCOUNTER — Other Ambulatory Visit: Payer: Self-pay

## 2019-12-06 ENCOUNTER — Other Ambulatory Visit (INDEPENDENT_AMBULATORY_CARE_PROVIDER_SITE_OTHER): Payer: Medicare PPO

## 2019-12-06 DIAGNOSIS — E7800 Pure hypercholesterolemia, unspecified: Secondary | ICD-10-CM | POA: Diagnosis not present

## 2019-12-06 DIAGNOSIS — I10 Essential (primary) hypertension: Secondary | ICD-10-CM | POA: Diagnosis not present

## 2019-12-06 DIAGNOSIS — M810 Age-related osteoporosis without current pathological fracture: Secondary | ICD-10-CM

## 2019-12-06 DIAGNOSIS — R7303 Prediabetes: Secondary | ICD-10-CM

## 2019-12-06 LAB — CBC WITH DIFFERENTIAL/PLATELET
Basophils Absolute: 0.1 10*3/uL (ref 0.0–0.1)
Basophils Relative: 1.1 % (ref 0.0–3.0)
Eosinophils Absolute: 0.1 10*3/uL (ref 0.0–0.7)
Eosinophils Relative: 2.5 % (ref 0.0–5.0)
HCT: 39.1 % (ref 36.0–46.0)
Hemoglobin: 12.7 g/dL (ref 12.0–15.0)
Lymphocytes Relative: 29.2 % (ref 12.0–46.0)
Lymphs Abs: 1.6 10*3/uL (ref 0.7–4.0)
MCHC: 32.3 g/dL (ref 30.0–36.0)
MCV: 84.8 fl (ref 78.0–100.0)
Monocytes Absolute: 0.4 10*3/uL (ref 0.1–1.0)
Monocytes Relative: 7.1 % (ref 3.0–12.0)
Neutro Abs: 3.2 10*3/uL (ref 1.4–7.7)
Neutrophils Relative %: 60.1 % (ref 43.0–77.0)
Platelets: 234 10*3/uL (ref 150.0–400.0)
RBC: 4.61 Mil/uL (ref 3.87–5.11)
RDW: 13.9 % (ref 11.5–15.5)
WBC: 5.4 10*3/uL (ref 4.0–10.5)

## 2019-12-06 LAB — COMPREHENSIVE METABOLIC PANEL
ALT: 21 U/L (ref 0–35)
AST: 22 U/L (ref 0–37)
Albumin: 4 g/dL (ref 3.5–5.2)
Alkaline Phosphatase: 50 U/L (ref 39–117)
BUN: 14 mg/dL (ref 6–23)
CO2: 34 mEq/L — ABNORMAL HIGH (ref 19–32)
Calcium: 9.9 mg/dL (ref 8.4–10.5)
Chloride: 102 mEq/L (ref 96–112)
Creatinine, Ser: 0.69 mg/dL (ref 0.40–1.20)
GFR: 79.95 mL/min (ref >60.00)
Glucose, Bld: 90 mg/dL (ref 70–99)
Potassium: 4 mEq/L (ref 3.5–5.1)
Sodium: 140 mEq/L (ref 135–145)
Total Bilirubin: 0.6 mg/dL (ref 0.2–1.2)
Total Protein: 6.9 g/dL (ref 6.0–8.3)

## 2019-12-06 LAB — LIPID PANEL
Cholesterol: 184 mg/dL (ref 0–200)
HDL: 64.6 mg/dL (ref >39.00)
LDL Cholesterol: 103 mg/dL — ABNORMAL HIGH (ref 0–99)
NonHDL: 119.79
Total CHOL/HDL Ratio: 3
Triglycerides: 84 mg/dL (ref 0.0–149.0)
VLDL: 16.8 mg/dL (ref 0.0–40.0)

## 2019-12-06 LAB — TSH: TSH: 1.57 u[IU]/mL (ref 0.35–4.50)

## 2019-12-06 LAB — VITAMIN D 25 HYDROXY (VIT D DEFICIENCY, FRACTURES): VITD: 35.91 ng/mL (ref 30.00–100.00)

## 2019-12-06 LAB — HEMOGLOBIN A1C: Hgb A1c MFr Bld: 5.8 % (ref 4.6–6.5)

## 2019-12-10 ENCOUNTER — Ambulatory Visit: Payer: Medicare Other

## 2019-12-10 ENCOUNTER — Other Ambulatory Visit: Payer: Medicare Other

## 2019-12-11 ENCOUNTER — Ambulatory Visit (INDEPENDENT_AMBULATORY_CARE_PROVIDER_SITE_OTHER): Payer: Medicare PPO

## 2019-12-11 ENCOUNTER — Other Ambulatory Visit: Payer: Self-pay

## 2019-12-11 DIAGNOSIS — Z0000 Encounter for general adult medical examination without abnormal findings: Secondary | ICD-10-CM

## 2019-12-11 NOTE — Patient Instructions (Signed)
[name] , Thank you for taking time to come for your Medicare Wellness Visit. I appreciate your ongoing commitment to your health goals. Please review the following plan we discussed and let me know if I can assist you in the future.   Screening recommendations/referrals: Colonoscopy: no longer required  Mammogram: due, Please call and schedule appointment soon. Bone Density: Up to date, completed 08/14/2018, due 07/2020 Recommended yearly ophthalmology/optometry visit for glaucoma screening and checkup Recommended yearly dental visit for hygiene and checkup  Vaccinations: Influenza vaccine: due, will get at upcoming physical Pneumococcal vaccine: Completed series Tdap vaccine: Up to date, completed 05/17/2016, due 04/2026 Shingles vaccine: due, check with your insurance regarding coverage/cost if interested    Covid-19:Completed series, Please bring card so we can document in your chart  Advanced directives: Please bring a copy of your POA (Power of [place]) and/or Living Will to your next appointment.   Conditions/risks identified: hypertension, hyperlipidemia  Next appointment: Follow up in one year for your annual wellness visit    Preventive Care 54 Years and Older, Female Preventive care refers to lifestyle choices and visits with your health care provider that can promote health and wellness. What does preventive care include?  A yearly physical exam. This is also called an annual well check.  Dental exams once or twice a year.  Routine eye exams. Ask your health care provider how often you should have your eyes checked.  Personal lifestyle choices, including:  Daily care of your teeth and gums.  Regular physical activity.  Eating a healthy diet.  Avoiding tobacco and drug use.  Limiting alcohol use.  Practicing safe sex.  Taking low-dose aspirin every day.  Taking vitamin and mineral supplements as recommended by your health care provider. What happens during  an annual well check? The services and screenings done by your health care provider during your annual well check will depend on your age, overall health, lifestyle risk factors, and family history of disease. Counseling  Your health care provider may ask you questions about your:  Alcohol use.  Tobacco use.  Drug use.  Emotional well-being.  Home and relationship well-being.  Sexual activity.  Eating habits.  History of falls.  Memory and ability to understand (cognition).  Work and work Statistician.  Reproductive health. Screening  You may have the following tests or measurements:  Height, weight, and BMI.  Blood pressure.  Lipid and cholesterol levels. These may be checked every 5 years, or more frequently if you are over 17 years old.  Skin check.  Lung cancer screening. You may have this screening every year starting at age 6 if you have a 30-pack-year history of smoking and currently smoke or have quit within the past 15 years.  Fecal occult blood test (FOBT) of the stool. You may have this test every year starting at age 79.  Flexible sigmoidoscopy or colonoscopy. You may have a sigmoidoscopy every 5 years or a colonoscopy every 10 years starting at age 35.  Hepatitis C blood test.  Hepatitis B blood test.  Sexually transmitted disease (STD) testing.  Diabetes screening. This is done by checking your blood sugar (glucose) after you have not eaten for a while (fasting). You may have this done every 1-3 years.  Bone density scan. This is done to screen for osteoporosis. You may have this done starting at age 32.  Mammogram. This may be done every 1-2 years. Talk to your health care provider about how often you should have regular  mammograms. Talk with your health care provider about your test results, treatment options, and if necessary, the need for more tests. Vaccines  Your health care provider may recommend certain vaccines, such as:  Influenza  vaccine. This is recommended every year.  Tetanus, diphtheria, and acellular pertussis (Tdap, Td) vaccine. You may need a Td booster every 10 years.  Zoster vaccine. You may need this after age 77.  Pneumococcal 13-valent conjugate (PCV13) vaccine. One dose is recommended after age 39.  Pneumococcal polysaccharide (PPSV23) vaccine. One dose is recommended after age 84. Talk to your health care provider about which screenings and vaccines you need and how often you need them. This information is not intended to replace advice given to you by your health care provider. Make sure you discuss any questions you have with your health care provider. Document Released: 02/07/2015 Document Revised: 10/01/2015 Document Reviewed: 11/12/2014 Elsevier Interactive Patient Education  2017 [place] Prevention in the Home Falls can cause injuries. They can happen to people of all ages. There are many things you can do to make your home safe and to help prevent falls. What can I do on the outside of my home?  Regularly fix the edges of walkways and driveways and fix any cracks.  Remove anything that might make you trip as you walk through a door, such as a raised step or threshold.  Trim any bushes or trees on the path to your home.  Use bright outdoor lighting.  Clear any walking paths of anything that might make someone trip, such as rocks or tools.  Regularly check to see if handrails are loose or broken. Make sure that both sides of any steps have handrails.  Any raised decks and porches should have guardrails on the edges.  Have any leaves, snow, or ice cleared regularly.  Use sand or salt on walking paths during winter.  Clean up any spills in your garage right away. This includes oil or grease spills. What can I do in the bathroom?  Use night lights.  Install grab bars by the toilet and in the tub and shower. Do not use towel bars as grab bars.  Use non-skid mats or decals  in the tub or shower.  If you need to sit down in the shower, use a plastic, non-slip stool.  Keep the floor dry. Clean up any water that spills on the floor as soon as it happens.  Remove soap buildup in the tub or shower regularly.  Attach bath mats securely with double-sided non-slip rug tape.  Do not have throw rugs and other things on the floor that can make you trip. What can I do in the bedroom?  Use night lights.  Make sure that you have a light by your bed that is easy to reach.  Do not use any sheets or blankets that are too big for your bed. They should not hang down onto the floor.  Have a firm chair that has side arms. You can use this for support while you get dressed.  Do not have throw rugs and other things on the floor that can make you trip. What can I do in the kitchen?  Clean up any spills right away.  Avoid walking on wet floors.  Keep items that you use a lot in easy-to-reach places.  If you need to reach something above you, use a strong step stool that has a grab bar.  Keep electrical cords out of the way.  Do not use floor polish or wax that makes floors slippery. If you must use wax, use non-skid floor wax.  Do not have throw rugs and other things on the floor that can make you trip. What can I do with my stairs?  Do not leave any items on the stairs.  Make sure that there are handrails on both sides of the stairs and use them. Fix handrails that are broken or loose. Make sure that handrails are as long as the stairways.  Check any carpeting to make sure that it is firmly attached to the stairs. Fix any carpet that is loose or worn.  Avoid having throw rugs at the top or bottom of the stairs. If you do have throw rugs, attach them to the floor with carpet tape.  Make sure that you have a light switch at the top of the stairs and the bottom of the stairs. If you do not have them, ask someone to add them for you. What else can I do to help  prevent falls?  Wear shoes that:  Do not have high heels.  Have rubber bottoms.  Are comfortable and fit you well.  Are closed at the toe. Do not wear sandals.  If you use a stepladder:  Make sure that it is fully opened. Do not climb a closed stepladder.  Make sure that both sides of the stepladder are locked into place.  Ask someone to hold it for you, if possible.  Clearly mark and make sure that you can see:  Any grab bars or handrails.  First and last steps.  Where the edge of each step is.  Use tools that help you move around (mobility aids) if they are needed. These include:  Canes.  Walkers.  Scooters.  Crutches.  Turn on the lights when you go into a dark area. Replace any light bulbs as soon as they burn out.  Set up your furniture so you have a clear path. Avoid moving your furniture around.  If any of your floors are uneven, fix them.  If there are any pets around you, be aware of where they are.  Review your medicines with your doctor. Some medicines can make you feel dizzy. This can increase your chance of falling. Ask your doctor what other things that you can do to help prevent falls. This information is not intended to replace advice given to you by your health care provider. Make sure you discuss any questions you have with your health care provider. Document Released: 11/07/2008 Document Revised: 06/19/2015 Document Reviewed: 02/15/2014 Elsevier Interactive Patient Education  2017 [employer].

## 2019-12-11 NOTE — Progress Notes (Signed)
PCP notes:  Health Maintenance: Flu- due Mammogram- due, Patient said she will call and schedule this.    Abnormal Screenings: none   Patient concerns: Knot on right side of neck for about 2 weeks    Nurse concerns: none   Next PCP appt.: 12/17/2019 @ 9:30 am

## 2019-12-11 NOTE — Progress Notes (Signed)
Subjective:   Sierra Bentley is a 86 y.o. female who presents for Medicare Annual (Subsequent) preventive examination.  Review of Systems: N/A      I connected with the patient today by telephone and verified that I am speaking with the correct person using two identifiers. Location patient: home Location nurse: work Persons participating in the telephone visit: patient, nurse.   I discussed the limitations, risks, security and privacy concerns of performing an evaluation and management service by telephone and the availability of in person appointments. I also discussed with the patient that there may be a patient responsible charge related to this service. The patient expressed understanding and verbally consented to this telephonic visit.        Cardiac Risk Factors include: advanced age (>77men, >36 women);hypertension;Other (see comment), Risk factor comments: hyperlipidemia     Objective:    Today's Vitals   There is no height or weight on file to calculate BMI.  Advanced Directives 12/11/2019 02/26/2018 02/25/2018 11/29/2017 09/15/2017 11/26/2016 08/29/2015  Does Patient Have a Medical Advance Directive? Yes No No No No No No  Type of Paramedic of [place];Living will - - - - - -  Copy of [place] in Chart? No - copy requested - - - - - -  Would patient like information on creating a medical advance directive? - Yes (Inpatient - patient defers creating a medical advance directive at this time) No - Patient declined No - Patient declined No - Patient declined - Yes - Educational materials given    Current Medications (verified) Outpatient Encounter Medications as of 12/11/2019  Medication Sig  . losartan-hydrochlorothiazide (HYZAAR) 50-12.5 MG tablet TAKE 1/2 TABLET BY MOUTH EVERY EVENING  . Multiple Vitamin (MULTIVITAMIN) tablet Take 1 tablet by mouth daily.    . mupirocin ointment (BACTROBAN) 2 % Apply to excision site QD  .  pantoprazole (PROTONIX) 40 MG tablet Take 40 mg by mouth every evening.   Vladimir Faster Glycol-Propyl Glycol (SYSTANE ULTRA OP) Place 1 drop into both eyes 4 (four) times daily.    . sodium chloride (MURO 128) 5 % ophthalmic ointment Place 1 application into both eyes at bedtime.   . TURMERIC PO Take 2 capsules by mouth daily before breakfast.   No facility-administered encounter medications on file as of 12/11/2019.    Allergies (verified) Ace inhibitors, Alendronate sodium, Chocolate, Dust mite extract, Ibandronate sodium, and Oxycodone   History: Past Medical History:  Diagnosis Date  . Allergic rhinitis, cause unspecified   . Cramp of limb   . Diverticulosis   . Epistaxis   . GERD (gastroesophageal reflux disease)   . HTN (hypertension)   . Osteopenia   . Other and unspecified hyperlipidemia   . Other malaise and fatigue   . Rheumatoid arthritis(714.0)   . Urine incontinence    Past Surgical History:  Procedure Laterality Date  . BREAST BIOPSY  2/11   fibrocystic change  . BREAST BIOPSY  8/11   fibrocystic change  . COLONOSCOPY  2003  . CYSTOSCOPY  2006  . MOLE REMOVAL     rectal  . TONSILLECTOMY     Family History  Problem Relation Age of Onset  . Heart attack Father 49  . Coronary artery disease Father   . Other Mother        Arrythmia  . Coronary artery disease Mother   . Hypertension Mother   . Anxiety disorder Mother   . Breast cancer Other  2nd cousin  . Depression Other        family history   Social History   Socioeconomic History  . Marital status: Married    Spouse name: Not on file  . Number of children: Not on file  . Years of education: Not on file  . Highest education level: Not on file  Occupational History  . Not on file  Tobacco Use  . Smoking status: Never Smoker  . Smokeless tobacco: Never Used  Vaping Use  . Vaping Use: Never used  Substance and Sexual Activity  . Alcohol use: No    Alcohol/week: 0.0 standard drinks  .  Drug use: No  . Sexual activity: Never  Other Topics Concern  . Not on file  Social History Narrative   Married      No biological children (Has step children)      Taught HS x 30 years      Regular exercise         Social Determinants of Health   Financial Resource Strain: Low Risk   . Difficulty of Paying Living Expenses: Not hard at all  Food Insecurity: No Food Insecurity  . Worried About Charity fundraiser in the Last Year: Never true  . Ran Out of Food in the Last Year: Never true  Transportation Needs: No Transportation Needs  . Lack of Transportation (Medical): No  . Lack of Transportation (Non-Medical): No  Physical Activity: Inactive  . Days of Exercise per Week: 0 days  . Minutes of Exercise per Session: 0 min  Stress: No Stress Concern Present  . Feeling of Stress : Not at all  Social Connections:   . Frequency of Communication with Friends and Family: Not on file  . Frequency of Social Gatherings with Friends and Family: Not on file  . Attends Religious Services: Not on file  . Active Member of Clubs or Organizations: Not on file  . Attends Archivist Meetings: Not on file  . Marital Status: Not on file    Tobacco Counseling Counseling given: Not Answered   Clinical Intake:  Pre-visit preparation completed: Yes  Pain : No/denies pain     Nutritional Risks: None Diabetes: No  How often do you need to have someone help you when you read instructions, pamphlets, or other written materials from your doctor or pharmacy?: 1 - Never What is the last grade level you completed in school?: bachelors degree  Diabetic: No Nutrition Risk Assessment:  Has the patient had any N/V/D within the last 2 months?  No  Does the patient have any non-healing wounds?  No  Has the patient had any unintentional weight loss or weight gain?  No   Diabetes:  Is the patient diabetic?  No  If diabetic, was a CBG obtained today?  N/A Did the patient bring in  their glucometer from home?  N/A How often do you monitor your CBG's? N/A.   Financial Strains and Diabetes Management:  Are you having any financial strains with the device, your supplies or your medication? N/A.  Does the patient want to be seen by Chronic Care Management for management of their diabetes?  N/A Would the patient like to be referred to a Nutritionist or for Diabetic Management?  N/A  Interpreter Needed?: No  Information entered by :: CJohnson, LPN   Activities of Daily Living In your present state of health, do you have any difficulty performing the following activities: 12/11/2019  Hearing? Darreld Mclean  Comment has hearing aid but does not wear it  Vision? N  Difficulty concentrating or making decisions? N  Walking or climbing stairs? N  Dressing or bathing? N  Doing errands, shopping? N  Preparing Food and eating ? N  Using the Toilet? N  In the past six months, have you accidently leaked urine? Y  Comment wears depends at night  Do you have problems with loss of bowel control? N  Managing your Medications? N  Managing your Finances? N  Housekeeping or managing your Housekeeping? N  Some recent data might be hidden    Patient Care Team: Tower, [name], MD as PCP - General Beverly Gust, MD as Referring Physician (Otolaryngology) Shirlee More, MD as Referring Physician (Ophthalmology)  Indicate any recent Medical Services you may have received from other than [facility] providers in the past year (date may be approximate).     Assessment:   This is a routine wellness examination for Sierra Bentley.  Hearing/Vision screen  Hearing Screening   125Hz  250Hz  500Hz  1000Hz  2000Hz  3000Hz  4000Hz  6000Hz  8000Hz   Right ear:           Left ear:           Vision Screening Comments: Patient gets annual eye exams   Dietary issues and exercise activities discussed: Current Exercise Habits: The patient does not participate in regular exercise at present, Exercise limited  by: None identified  Goals    . Increase water intake     Starting 11/29/2017, I will attempt to drink at least 6-8 glasses of water daily.     . Patient Stated     12/11/2019, I will maintain and continue medications as prescribed.       Depression Screen PHQ 2/9 Scores 12/11/2019 12/08/2018 11/29/2017 11/26/2016 08/29/2015 08/27/2014 07/25/2013  PHQ - 2 Score 0 0 1 0 0 0 0  PHQ- 9 Score 0 - 2 0 - - -    Fall Risk Fall Risk  12/11/2019 12/08/2018 08/23/2018 11/29/2017 11/26/2016  Falls in the past year? 1 1 1 1  No  Comment - - - fell while walking in hospital; fell on porch onto glider -  Number falls in past yr: 0 1 1 1  -  Injury with Fall? 0 1 1 1  -  Comment - - - injury to right knee -  Risk Factor Category  - - - - -  Risk for fall due to : Medication side effect - - History of fall(s);Impaired balance/gait -  Follow up Falls evaluation completed;Falls prevention discussed Falls evaluation completed - - -    Any stairs in or around the home? Yes  If so, are there any without handrails? No  Home free of loose throw rugs in walkways, pet beds, electrical cords, etc? Yes  Adequate lighting in your home to reduce risk of falls? Yes   ASSISTIVE DEVICES UTILIZED TO PREVENT FALLS:  Life alert? No  Use of a cane, walker or w/c? Yes  Grab bars in the bathroom? No  Shower chair or bench in shower? No  Elevated toilet seat or a handicapped toilet? No   TIMED UP AND GO:  Was the test performed? N/A, telephone visit.   Cognitive Function: MMSE - Mini Mental State Exam 12/11/2019 11/29/2017 11/26/2016 08/29/2015  Orientation to time 5 5 5 5   Orientation to Place 5 5 5 5   Registration 3 3 3 3   Attention/ Calculation 5 0 0 0  Recall 3 3 3 2   Recall-comments - - -  pt was unable to recall 1 of 3 words  Language- name 2 objects - 0 0 0  Language- repeat 1 1 1 1   Language- follow 3 step command - 3 3 3   Language- read & follow direction - 0 0 0  Write a sentence - 0 0 0  Copy design - 0 0  0  Total score - 20 20 19   Mini Cog  Mini-Cog screen was completed. Maximum score is 22. A value of 0 denotes this part of the MMSE was not completed or the patient failed this part of the Mini-Cog screening.       Immunizations Immunization History  Administered Date(s) Administered  . Fluad Quad(high Dose 65+) 11/14/2018  . Influenza Split 11/30/2010, 12/16/2011  . Influenza Whole 10/26/2007  . Influenza,inj,Quad PF,6+ Mos 11/09/2012, 11/16/2013, 01/03/2015, 11/06/2015, 11/26/2016, 12/02/2017  . Pneumococcal Conjugate-13 07/25/2013  . Pneumococcal Polysaccharide-23 11/30/2010  . Td 01/25/2005, 05/17/2016  . Zoster 03/29/2008    TDAP status: Up to date Flu Vaccine status: due, will get at upcoming physical  Pneumococcal vaccine status: Up to date Covid-19 vaccine status: Completed vaccines, Will bring card to physical so dates can be documented in chart  Qualifies for Shingles Vaccine? Yes   Zostavax completed Yes   Shingrix Completed?: No.    Education has been provided regarding the importance of this vaccine. Patient has been advised to call insurance company to determine out of pocket expense if they have not yet received this vaccine. Advised may also receive vaccine at local pharmacy or Health Dept. Verbalized acceptance and understanding.  Screening Tests Health Maintenance  Topic Date Due  . COVID-19 Vaccine (1) Never done  . INFLUENZA VACCINE  08/26/2019  . MAMMOGRAM  11/06/2019  . TETANUS/TDAP  05/18/2026  . DEXA SCAN  Completed  . PNA vac Low Risk Adult  Completed    Health Maintenance  Health Maintenance Due  Topic Date Due  . COVID-19 Vaccine (1) Never done  . INFLUENZA VACCINE  08/26/2019  . MAMMOGRAM  11/06/2019    Colorectal cancer screening: No longer required.  Mammogram status: due, Patient will call and schedule this very soon.  Bone Density status: Completed 08/14/2018. Results reflect: Bone density results: OSTEOPENIA. Repeat every 2  years.  Lung Cancer Screening: (Low Dose CT Chest recommended if Age 40-80 years, 30 pack-year currently smoking OR have quit w/in 15 years.) does not qualify.    Additional Screening:  Hepatitis C Screening: does not qualify; Completed N/A  Vision Screening: Recommended annual ophthalmology exams for early detection of glaucoma and other disorders of the eye. Is the patient up to date with their annual eye exam?  Yes  Who is the provider or what is the name of the office in which the patient attends annual eye exams? [facility] If pt is not established with a provider, would they like to be referred to a provider to establish care? No .   Dental Screening: Recommended annual dental exams for proper oral hygiene  Community Resource Referral / Chronic Care Management: CRR required this visit?  No   CCM required this visit?  No      Plan:     I have personally reviewed and noted the following in the patient's chart:   . Medical and social history . Use of alcohol, tobacco or illicit drugs  . Current medications and supplements . Functional ability and status . Nutritional status . Physical activity . Advanced directives . List of other physicians .  Hospitalizations, surgeries, and ER visits in previous 12 months . Vitals . Screenings to include cognitive, depression, and falls . Referrals and appointments  In addition, I have reviewed and discussed with patient certain preventive protocols, quality metrics, and best practice recommendations. A written personalized care plan for preventive services as well as general preventive health recommendations were provided to patient.   Due to this being a telephonic visit, the after visit summary with patients personalized plan was offered to patient via office or my-chart. Patient preferred to pick up at office at next visit or via mychart.   Andrez Grime, LPN   [id number]

## 2019-12-17 ENCOUNTER — Ambulatory Visit (INDEPENDENT_AMBULATORY_CARE_PROVIDER_SITE_OTHER): Payer: Medicare PPO | Admitting: Family Medicine

## 2019-12-17 ENCOUNTER — Encounter: Payer: Self-pay | Admitting: Family Medicine

## 2019-12-17 ENCOUNTER — Other Ambulatory Visit: Payer: Self-pay

## 2019-12-17 VITALS — BP 118/64 | HR 75 | Temp 96.9°F | Ht 63.25 in | Wt 116.1 lb

## 2019-12-17 DIAGNOSIS — K219 Gastro-esophageal reflux disease without esophagitis: Secondary | ICD-10-CM | POA: Diagnosis not present

## 2019-12-17 DIAGNOSIS — R7303 Prediabetes: Secondary | ICD-10-CM | POA: Diagnosis not present

## 2019-12-17 DIAGNOSIS — E7800 Pure hypercholesterolemia, unspecified: Secondary | ICD-10-CM

## 2019-12-17 DIAGNOSIS — Z23 Encounter for immunization: Secondary | ICD-10-CM | POA: Diagnosis not present

## 2019-12-17 DIAGNOSIS — I10 Essential (primary) hypertension: Secondary | ICD-10-CM | POA: Diagnosis not present

## 2019-12-17 DIAGNOSIS — Z0000 Encounter for general adult medical examination without abnormal findings: Secondary | ICD-10-CM | POA: Diagnosis not present

## 2019-12-17 DIAGNOSIS — M069 Rheumatoid arthritis, unspecified: Secondary | ICD-10-CM | POA: Diagnosis not present

## 2019-12-17 DIAGNOSIS — M810 Age-related osteoporosis without current pathological fracture: Secondary | ICD-10-CM

## 2019-12-17 MED ORDER — LOSARTAN POTASSIUM-HCTZ 50-12.5 MG PO TABS
0.5000 | ORAL_TABLET | Freq: Every evening | ORAL | 3 refills | Status: DC
Start: 2019-12-17 — End: 2020-12-17

## 2019-12-17 NOTE — Assessment & Plan Note (Signed)
No clinical changes Not treated

## 2019-12-17 NOTE — Assessment & Plan Note (Signed)
Reviewed health habits including diet and exercise and skin cancer prevention Reviewed appropriate screening tests for age  Also reviewed health mt list, fam hx and immunization status , as well as social and family history   See HPI Labs reviewed  amw reviewed  Mammogram is scheduled for next mo  Pt sees gyn for breast /pelvic exam and bone density testing  One fall/no new fx (disc fall prev)  Flu shot given Recommend covid booster Discussed shingrix vaccine  She will disc colon cancer screen at her GI appt  Also enc to disc sore throat since GERD could cause

## 2019-12-17 NOTE — Assessment & Plan Note (Signed)
Takes protonix Some ST lately  She plans to d/w her GI

## 2019-12-17 NOTE — Patient Instructions (Addendum)
I do recommend a covid booster   If you are interested in the new shingles vaccine (Shingrix) - call your local pharmacy to check on coverage and availability  If affordable, get on a wait list at your pharmacy to get the vaccine.  Try to take care of yourself

## 2019-12-17 NOTE — Assessment & Plan Note (Signed)
Lab Results  Component Value Date   HGBA1C 5.8 12/06/2019   disc imp of low glycemic diet and wt loss to prevent DM2

## 2019-12-17 NOTE — Assessment & Plan Note (Signed)
bp in fair control at this time  BP Readings from Last 1 Encounters:  12/17/19 118/64   No changes needed Most recent labs reviewed  Disc lifstyle change with low sodium diet and exercise  Plan to continue losartan-hct 50-12.5  1/2 pill daily

## 2019-12-17 NOTE — Progress Notes (Signed)
Subjective:    Patient ID: Sierra Bentley, female    DOB: 1933/02/11, 86 y.o.   MRN: [id number]  This visit occurred during the SARS-CoV-2 public health emergency.  Safety protocols were in place, including screening questions prior to the visit, additional usage of staff PPE, and extensive cleaning of exam room while observing appropriate contact time as indicated for disinfecting solutions.    HPI Here for health maintenance exam and to review chronic medical problems    Wt Readings from Last 3 Encounters:  12/17/19 116 lb 2 oz (52.7 kg)  08/30/19 117 lb 12.8 oz (53.4 kg)  05/07/19 127 lb 3 oz (57.7 kg)   20.41 kg/m  Loosing weight-not as much appetite and eating like her husband  Does not want to eat food he cannot eat in front of him  Has been doing ok overall  Husband does home dialysis - a lot of work [employer] is also mobility impaired    amw was 11/16  Noted flu shot and mammogram due   Flu shot today  utd pna vaccines  Td 4/18  zostavax 3/10  covid status -immunized moderna and considering booster  Mammogram 10/20-- has appt in dec Self breast exam -no lumps   Had gyn breast/pelvic exam  Colonoscopy 9/13-no recall recommended due to age  Neg ifob 8/16 She still sees GI once per year -will discuss  dexa 7/20 -osteopenia  Followed by gyn Fall-fell backwards once - standing at the sink/lost balance (no injuries)  Uses a cane  fx -past h/o hip fx in 2020 Supplements - taking ca and D Exercise - very busy- caring for husband /never sits down  Has to take husb to all of his appts-  Lot of walking  [employer] with dog also  Takes ppi   HTN  bp in fair control at this time  BP Readings from Last 1 Encounters:  12/17/19 118/64   No changes needed Most recent labs reviewed  Disc lifstyle change with low sodium diet and exercise   Taking losartan hctz 50-12.5 mg  1/2 pill daily  Pulse Readings from Last 3 Encounters:  12/17/19 75  05/07/19 79    12/08/18 75     Lab Results  Component Value Date   CREATININE 0.69 12/06/2019   BUN 14 12/06/2019   NA 140 12/06/2019   K 4.0 12/06/2019   CL 102 12/06/2019   CO2 34 (H) 12/06/2019   Lab Results  Component Value Date   ALT 21 12/06/2019   AST 22 12/06/2019   ALKPHOS 50 12/06/2019   BILITOT 0.6 12/06/2019    Hyperlipidemia Lab Results  Component Value Date   CHOL 184 12/06/2019   CHOL 196 12/01/2018   CHOL 211 (H) 11/29/2017   Lab Results  Component Value Date   HDL 64.60 12/06/2019   HDL 52.80 12/01/2018   HDL 50.90 11/29/2017   Lab Results  Component Value Date   LDLCALC 103 (H) 12/06/2019   LDLCALC 125 (H) 12/01/2018   LDLCALC 141 (H) 11/29/2017   Lab Results  Component Value Date   TRIG 84.0 12/06/2019   TRIG 88.0 12/01/2018   TRIG 96.0 11/29/2017   Lab Results  Component Value Date   CHOLHDL 3 12/06/2019   CHOLHDL 4 12/01/2018   CHOLHDL 4 11/29/2017   Lab Results  Component Value Date   LDLDIRECT 130.4 02/25/2012   LDLDIRECT 134.1 08/18/2011   LDLDIRECT 155.9 11/30/2010    Improved - eating better with her  husband   Prediabetes Lab Results  Component Value Date   HGBA1C 5.8 12/06/2019  very good control   occ gets a pain in R neck  Off/on for 3 weeks  Fleeting    Review of Systems  Constitutional: Negative for activity change, appetite change, fatigue, fever and unexpected weight change.  HENT: Positive for sore throat. Negative for congestion, ear pain, rhinorrhea and sinus pressure.   Eyes: Negative for pain, redness and visual disturbance.  Respiratory: Negative for cough, shortness of breath and wheezing.   Cardiovascular: Negative for chest pain and palpitations.  Gastrointestinal: Negative for abdominal pain, blood in stool, constipation and diarrhea.  Endocrine: Negative for polydipsia and polyuria.  Genitourinary: Negative for dysuria, frequency and urgency.  Musculoskeletal: Negative for arthralgias, back pain and  myalgias.  Skin: Negative for pallor and rash.  Allergic/Immunologic: Negative for environmental allergies.  Neurological: Negative for dizziness, syncope and headaches.  Hematological: Negative for adenopathy. Does not bruise/bleed easily.  Psychiatric/Behavioral: Negative for decreased concentration and dysphoric mood. The patient is not nervous/anxious.        Objective:   Physical Exam Constitutional:      General: She is not in acute distress.    Appearance: Normal appearance. She is well-developed. She is not ill-appearing or diaphoretic.  HENT:     Head: Normocephalic and atraumatic.     Right Ear: Tympanic membrane, ear canal and external ear normal.     Left Ear: Tympanic membrane, ear canal and external ear normal.     Nose: Nose normal. No congestion.     Mouth/Throat:     Mouth: Mucous membranes are moist.     Pharynx: Oropharynx is clear. No posterior oropharyngeal erythema.  Eyes:     General: No scleral icterus.    Extraocular Movements: Extraocular movements intact.     Conjunctiva/sclera: Conjunctivae normal.     Pupils: Pupils are equal, round, and reactive to light.  Neck:     Thyroid: No thyromegaly.     Vascular: No carotid bruit or JVD.  Cardiovascular:     Rate and Rhythm: Normal rate and regular rhythm.     Pulses: Normal pulses.     Heart sounds: Normal heart sounds. No gallop.   Pulmonary:     Effort: Pulmonary effort is normal. No respiratory distress.     Breath sounds: Normal breath sounds. No wheezing.     Comments: Good air exch Chest:     Chest wall: No tenderness.  Abdominal:     General: Bowel sounds are normal. There is no distension or abdominal bruit.     Palpations: Abdomen is soft. There is no mass.     Tenderness: There is no abdominal tenderness.     Hernia: No hernia is present.  Genitourinary:    Comments: Gyn provider does breast and pelvic exam Musculoskeletal:        General: No tenderness. Normal range of motion.      Cervical back: Normal range of motion and neck supple. No rigidity. No muscular tenderness.     Right lower leg: No edema.     Left lower leg: No edema.     Comments: Slight kyphosis   Lymphadenopathy:     Cervical: No cervical adenopathy.  Skin:    General: Skin is warm and dry.     Coloration: Skin is not pale.     Findings: No erythema or rash.     Comments: Fair Some skin tags  No lesion or ulcer  seen over buttocks   Neurological:     Mental Status: She is alert. Mental status is at baseline.     Cranial Nerves: No cranial nerve deficit.     Motor: No abnormal muscle tone.     Coordination: Coordination normal.     Gait: Gait normal.     Deep Tendon Reflexes: Reflexes are normal and symmetric. Reflexes normal.  Psychiatric:        Mood and Affect: Mood normal.        Cognition and Memory: Cognition and memory normal.     Comments: Mentally sharp           Assessment & Plan:   Problem List Items Addressed This Visit      Cardiovascular and Mediastinum   Essential hypertension, benign    bp in fair control at this time  BP Readings from Last 1 Encounters:  12/17/19 118/64   No changes needed Most recent labs reviewed  Disc lifstyle change with low sodium diet and exercise  Plan to continue losartan-hct 50-12.5  1/2 pill daily       Relevant Medications   losartan-hydrochlorothiazide (HYZAAR) 50-12.5 MG tablet     Digestive   GERD    Takes protonix Some ST lately  She plans to d/w her GI         Musculoskeletal and Integument   Rheumatoid arthritis (HCC)    No clinical changes Not treated       Osteoporosis    Due for dexa-she will d/w her gyn who follows for this  Enc ca and D and exercise   Disc need for calcium/ vitamin D/ wt bearing exercise and bone density test every 2 y to monitor Disc safety/ fracture risk in detail   Disc fall prev  Past h/o hip fx in 2020        Other   Hyperlipidemia    Improved now that she follows her  husband's diet  Disc goals for lipids and reasons to control them Rev last labs with pt Rev low sat fat diet in detail  commended      Relevant Medications   losartan-hydrochlorothiazide (HYZAAR) 50-12.5 MG tablet   Routine general medical examination at a health care facility - Primary    Reviewed health habits including diet and exercise and skin cancer prevention Reviewed appropriate screening tests for age  Also reviewed health mt list, fam hx and immunization status , as well as social and family history   See HPI Labs reviewed  amw reviewed  Mammogram is scheduled for next mo  Pt sees gyn for breast /pelvic exam and bone density testing  One fall/no new fx (disc fall prev)  Flu shot given Recommend covid booster Discussed shingrix vaccine  She will disc colon cancer screen at her GI appt  Also enc to disc sore throat since GERD could cause        Prediabetes    Lab Results  Component Value Date   HGBA1C 5.8 12/06/2019   disc imp of low glycemic diet and wt loss to prevent DM2        Other Visit Diagnoses    Need for influenza vaccination       Relevant Orders   Flu Vaccine QUAD High Dose(Fluad) (Completed)

## 2019-12-17 NOTE — Assessment & Plan Note (Signed)
Improved now that she follows her husband's diet  Disc goals for lipids and reasons to control them Rev last labs with pt Rev low sat fat diet in detail  commended

## 2019-12-17 NOTE — Assessment & Plan Note (Signed)
Due for dexa-she will d/w her gyn who follows for this  Enc ca and D and exercise   Disc need for calcium/ vitamin D/ wt bearing exercise and bone density test every 2 y to monitor Disc safety/ fracture risk in detail   Disc fall prev  Past h/o hip fx in 2020

## 2020-01-10 ENCOUNTER — Encounter: Payer: Self-pay | Admitting: Family Medicine

## 2020-01-10 DIAGNOSIS — Z1231 Encounter for screening mammogram for malignant neoplasm of breast: Secondary | ICD-10-CM | POA: Diagnosis not present

## 2020-02-01 DIAGNOSIS — H353120 Nonexudative age-related macular degeneration, left eye, stage unspecified: Secondary | ICD-10-CM | POA: Diagnosis not present

## 2020-02-01 DIAGNOSIS — H353211 Exudative age-related macular degeneration, right eye, with active choroidal neovascularization: Secondary | ICD-10-CM | POA: Diagnosis not present

## 2020-03-03 DIAGNOSIS — H3589 Other specified retinal disorders: Secondary | ICD-10-CM | POA: Diagnosis not present

## 2020-03-03 DIAGNOSIS — H353211 Exudative age-related macular degeneration, right eye, with active choroidal neovascularization: Secondary | ICD-10-CM | POA: Diagnosis not present

## 2020-04-02 DIAGNOSIS — R42 Dizziness and giddiness: Secondary | ICD-10-CM | POA: Diagnosis not present

## 2020-04-02 DIAGNOSIS — H6123 Impacted cerumen, bilateral: Secondary | ICD-10-CM | POA: Diagnosis not present

## 2020-04-02 DIAGNOSIS — H903 Sensorineural hearing loss, bilateral: Secondary | ICD-10-CM | POA: Diagnosis not present

## 2020-04-07 DIAGNOSIS — H353122 Nonexudative age-related macular degeneration, left eye, intermediate dry stage: Secondary | ICD-10-CM | POA: Diagnosis not present

## 2020-04-07 DIAGNOSIS — Z79899 Other long term (current) drug therapy: Secondary | ICD-10-CM | POA: Diagnosis not present

## 2020-04-07 DIAGNOSIS — H353211 Exudative age-related macular degeneration, right eye, with active choroidal neovascularization: Secondary | ICD-10-CM | POA: Diagnosis not present

## 2020-04-21 DIAGNOSIS — H52202 Unspecified astigmatism, left eye: Secondary | ICD-10-CM | POA: Diagnosis not present

## 2020-04-21 DIAGNOSIS — H5202 Hypermetropia, left eye: Secondary | ICD-10-CM | POA: Diagnosis not present

## 2020-05-19 DIAGNOSIS — H353231 Exudative age-related macular degeneration, bilateral, with active choroidal neovascularization: Secondary | ICD-10-CM | POA: Diagnosis not present

## 2020-06-05 DIAGNOSIS — R634 Abnormal weight loss: Secondary | ICD-10-CM | POA: Diagnosis not present

## 2020-06-05 DIAGNOSIS — K219 Gastro-esophageal reflux disease without esophagitis: Secondary | ICD-10-CM | POA: Diagnosis not present

## 2020-06-05 DIAGNOSIS — R140 Abdominal distension (gaseous): Secondary | ICD-10-CM | POA: Diagnosis not present

## 2020-06-16 DIAGNOSIS — H353122 Nonexudative age-related macular degeneration, left eye, intermediate dry stage: Secondary | ICD-10-CM | POA: Diagnosis not present

## 2020-06-16 DIAGNOSIS — Z961 Presence of intraocular lens: Secondary | ICD-10-CM | POA: Diagnosis not present

## 2020-06-16 DIAGNOSIS — H18519 Endothelial corneal dystrophy, unspecified eye: Secondary | ICD-10-CM | POA: Diagnosis not present

## 2020-06-16 DIAGNOSIS — H353231 Exudative age-related macular degeneration, bilateral, with active choroidal neovascularization: Secondary | ICD-10-CM | POA: Diagnosis not present

## 2020-07-18 DIAGNOSIS — H353231 Exudative age-related macular degeneration, bilateral, with active choroidal neovascularization: Secondary | ICD-10-CM | POA: Diagnosis not present

## 2020-07-21 DIAGNOSIS — H353231 Exudative age-related macular degeneration, bilateral, with active choroidal neovascularization: Secondary | ICD-10-CM | POA: Diagnosis not present

## 2020-08-15 DIAGNOSIS — Z961 Presence of intraocular lens: Secondary | ICD-10-CM | POA: Diagnosis not present

## 2020-08-15 DIAGNOSIS — H02831 Dermatochalasis of right upper eyelid: Secondary | ICD-10-CM | POA: Diagnosis not present

## 2020-08-15 DIAGNOSIS — H18513 Endothelial corneal dystrophy, bilateral: Secondary | ICD-10-CM | POA: Diagnosis not present

## 2020-08-15 DIAGNOSIS — H04129 Dry eye syndrome of unspecified lacrimal gland: Secondary | ICD-10-CM | POA: Diagnosis not present

## 2020-08-15 DIAGNOSIS — H02834 Dermatochalasis of left upper eyelid: Secondary | ICD-10-CM | POA: Diagnosis not present

## 2020-08-15 DIAGNOSIS — H353231 Exudative age-related macular degeneration, bilateral, with active choroidal neovascularization: Secondary | ICD-10-CM | POA: Diagnosis not present

## 2020-08-15 DIAGNOSIS — H40003 Preglaucoma, unspecified, bilateral: Secondary | ICD-10-CM | POA: Diagnosis not present

## 2020-08-15 DIAGNOSIS — H35363 Drusen (degenerative) of macula, bilateral: Secondary | ICD-10-CM | POA: Diagnosis not present

## 2020-08-15 DIAGNOSIS — H43813 Vitreous degeneration, bilateral: Secondary | ICD-10-CM | POA: Diagnosis not present

## 2020-08-22 DIAGNOSIS — M79675 Pain in left toe(s): Secondary | ICD-10-CM | POA: Diagnosis not present

## 2020-08-22 DIAGNOSIS — M79674 Pain in right toe(s): Secondary | ICD-10-CM | POA: Diagnosis not present

## 2020-08-22 DIAGNOSIS — B351 Tinea unguium: Secondary | ICD-10-CM | POA: Diagnosis not present

## 2020-08-22 DIAGNOSIS — L600 Ingrowing nail: Secondary | ICD-10-CM | POA: Diagnosis not present

## 2020-08-22 DIAGNOSIS — M2041 Other hammer toe(s) (acquired), right foot: Secondary | ICD-10-CM | POA: Diagnosis not present

## 2020-09-26 DIAGNOSIS — H353231 Exudative age-related macular degeneration, bilateral, with active choroidal neovascularization: Secondary | ICD-10-CM | POA: Diagnosis not present

## 2020-10-10 ENCOUNTER — Telehealth: Payer: Self-pay | Admitting: *Deleted

## 2020-10-10 ENCOUNTER — Other Ambulatory Visit: Payer: Self-pay

## 2020-10-10 ENCOUNTER — Encounter: Payer: Self-pay | Admitting: Obstetrics & Gynecology

## 2020-10-10 ENCOUNTER — Ambulatory Visit (INDEPENDENT_AMBULATORY_CARE_PROVIDER_SITE_OTHER): Payer: Medicare PPO | Admitting: Obstetrics & Gynecology

## 2020-10-10 VITALS — BP 122/78 | Ht 64.0 in | Wt 111.0 lb

## 2020-10-10 DIAGNOSIS — R1032 Left lower quadrant pain: Secondary | ICD-10-CM | POA: Diagnosis not present

## 2020-10-10 DIAGNOSIS — Z780 Asymptomatic menopausal state: Secondary | ICD-10-CM

## 2020-10-10 DIAGNOSIS — M8589 Other specified disorders of bone density and structure, multiple sites: Secondary | ICD-10-CM

## 2020-10-10 DIAGNOSIS — R634 Abnormal weight loss: Secondary | ICD-10-CM | POA: Diagnosis not present

## 2020-10-10 DIAGNOSIS — Z01419 Encounter for gynecological examination (general) (routine) without abnormal findings: Secondary | ICD-10-CM

## 2020-10-10 LAB — TSH: TSH: 1.88 mIU/L (ref 0.40–4.50)

## 2020-10-10 NOTE — Telephone Encounter (Signed)
Patient scheduled on 10/17/20 @ 10:00am order faxed to [facility].

## 2020-10-10 NOTE — Progress Notes (Signed)
Sierra Bentley 1933/05/10 [id number]   History:    87 y.o. . G0 Married.  Husband is 47 yo with mild Dementia and Kidney Failure on Home Dialysis.  Living together in their house.   RP:  Established patient presenting for annual gyn exam and intermittent left pelvic pain with abnormal weight loss   HPI: Postmenopause, well on no hormone replacement therapy.  Abstinent.  No postmenopausal bleeding.  Intermittent left pelvic pain. Frequent urinary incontinence sometimes with effort and as other times just a feeling of urgency. Breasts normal.  Body mass index decreasing x last 2 yrs, now 19.05. Physically active. Fasting health labs with family physician.  Osteoporosis previously and Pelvic bone fracture after a fall 02/2018.  BD 07/2018 Osteopenia.  Taking vitamin D supplements, calcium rich diet.    Past medical history,surgical history, family history and social history were all reviewed and documented in the EPIC chart.  Gynecologic History No LMP recorded. Patient is postmenopausal.  Obstetric History OB History  Gravida Para Term Preterm AB Living  0 0 0 0 0 0  SAB IAB Ectopic Multiple Live Births  0 0 0 0 0  Obstetric Comments  HAS 2 STEP SONS      ROS: A ROS was performed and pertinent positives and negatives are included in the history.  GENERAL: No fevers or chills. HEENT: No change in vision, no earache, sore throat or sinus congestion. NECK: No pain or stiffness. CARDIOVASCULAR: No chest pain or pressure. No palpitations. PULMONARY: No shortness of breath, cough or wheeze. GASTROINTESTINAL: No abdominal pain, nausea, vomiting or diarrhea, melena or bright red blood per rectum. GENITOURINARY: No urinary frequency, urgency, hesitancy or dysuria. MUSCULOSKELETAL: No joint or muscle pain, no back pain, no recent trauma. DERMATOLOGIC: No rash, no itching, no lesions. ENDOCRINE: No polyuria, polydipsia, no heat or cold intolerance. No recent change in weight. HEMATOLOGICAL: No  anemia or easy bruising or bleeding. NEUROLOGIC: No headache, seizures, numbness, tingling or weakness. PSYCHIATRIC: No depression, no loss of interest in normal activity or change in sleep pattern.     Exam:   BP 122/78 (Cuff Size: Normal)   Ht '5\' 4"'$  (1.626 m)   Wt 111 lb (50.3 kg)   BMI 19.05 kg/m   Body mass index is 19.05 kg/m.  General appearance : Well developed well nourished female. No acute distress HEENT: Eyes: no retinal hemorrhage or exudates,  Neck supple, trachea midline, no carotid bruits, no thyroidmegaly Lungs: Clear to auscultation, no rhonchi or wheezes, or rib retractions  Heart: Regular rate and rhythm, no murmurs or gallops Breast:Examined in sitting and supine position were symmetrical in appearance, no palpable masses or tenderness,  no skin retraction, no nipple inversion, no nipple discharge, no skin discoloration, no axillary or supraclavicular lymphadenopathy Abdomen: no palpable masses or tenderness, no rebound or guarding Extremities: no edema or skin discoloration or tenderness  Pelvic: Vulva: Normal             Vagina: No gross lesions or discharge  Cervix: No gross lesions or discharge  Uterus  AV, normal size, shape and consistency, non-tender and mobile  Adnexa  Without masses or tenderness  Anus: Normal   Assessment/Plan:  87 y.o. female for annual exam   1. Well female exam with routine gynecological exam Normal gynecologic exam in menopause.  No indication to repeat Pap test at this time.  Breast exam normal.  Screening mammogram December 2021 was negative.  No longer doing colonoscopy, seen by gastro  recently for weight loss.  Health labs with family physician.  2. Postmenopause Well on no hormone replacement therapy.  No postmenopausal bleeding.  3. Osteopenia of multiple sites History of osteoporosis with fragility fracture.  Last bone density in 2020 showed osteopenia.  Taking vitamin D supplements and calcium and nutrition.   Weightbearing physical activities every day.  Scheduled for a repeat bone density at [facility] next week.  4. Intermittent left lower quadrant abdominal pain Intermittent left pelvic pain.  Also losing weight with no obvious cause.  Will investigate with a pelvic ultrasound at follow-up.  Rule out ovarian pathology.  Rule out gynecologic cancer. - US Transvaginal Non-OB; Future  5. Weight loss, abnormal Rule out hypothyroidism as the cause of her progressive weight loss.  Recommend a high calorie/protein smoothie daily to add calories to her current nutrition. - TSH - US Transvaginal Non-OB; Future   Princess Bruins MD, 12:22 PM 10/10/2020

## 2020-10-10 NOTE — Telephone Encounter (Signed)
-----   Message from Princess Bruins, MD sent at 10/10/2020 12:20 PM EDT ----- Regarding: Order for BD at [place] for BD at [facility] next week, needs an order from [place].

## 2020-10-13 ENCOUNTER — Encounter: Payer: Self-pay | Admitting: *Deleted

## 2020-10-16 ENCOUNTER — Other Ambulatory Visit: Payer: Medicare PPO | Admitting: Obstetrics & Gynecology

## 2020-10-16 ENCOUNTER — Other Ambulatory Visit: Payer: Medicare PPO

## 2020-10-17 DIAGNOSIS — Z780 Asymptomatic menopausal state: Secondary | ICD-10-CM | POA: Diagnosis not present

## 2020-10-17 DIAGNOSIS — M810 Age-related osteoporosis without current pathological fracture: Secondary | ICD-10-CM | POA: Diagnosis not present

## 2020-10-21 ENCOUNTER — Encounter: Payer: Self-pay | Admitting: Obstetrics & Gynecology

## 2020-10-21 ENCOUNTER — Telehealth: Payer: Self-pay

## 2020-10-21 ENCOUNTER — Other Ambulatory Visit: Payer: Self-pay

## 2020-10-21 ENCOUNTER — Ambulatory Visit (INDEPENDENT_AMBULATORY_CARE_PROVIDER_SITE_OTHER): Payer: Medicare PPO

## 2020-10-21 ENCOUNTER — Ambulatory Visit: Payer: Medicare PPO | Admitting: Obstetrics & Gynecology

## 2020-10-21 VITALS — BP 110/64 | Wt 111.0 lb

## 2020-10-21 DIAGNOSIS — R9389 Abnormal findings on diagnostic imaging of other specified body structures: Secondary | ICD-10-CM

## 2020-10-21 DIAGNOSIS — R1032 Left lower quadrant pain: Secondary | ICD-10-CM | POA: Diagnosis not present

## 2020-10-21 DIAGNOSIS — R634 Abnormal weight loss: Secondary | ICD-10-CM

## 2020-10-21 DIAGNOSIS — N840 Polyp of corpus uteri: Secondary | ICD-10-CM

## 2020-10-21 NOTE — Progress Notes (Signed)
    Camella Seim Amoroso May 25, 1933 [id number]        87 y.o.  G0  RP: LLQ intermittent pain for Pelvic US  HPI: LLQ intermittent pain.  No PMB.  Lost weight without trying in the last year.     OB History  Gravida Para Term Preterm AB Living  0 0 0 0 0 0  SAB IAB Ectopic Multiple Live Births  0 0 0 0 0  Obstetric Comments  HAS 2 STEP SONS     Past medical history,surgical history, problem list, medications, allergies, family history and social history were all reviewed and documented in the EPIC chart.   Directed ROS with pertinent positives and negatives documented in the history of present illness/assessment and plan.  Exam:  Vitals:   10/21/20 1517  BP: 110/64  Weight: 111 lb (50.3 kg)   General appearance:  Normal  Pelvic US today: T/V images.  Small anteverted uterus with a single subserous fibroid posteriorly measured at 2 cm.  The overall uterine size is 5.27 x 3.6 x 7.39 cm.  Thickened endometrial lining at 7 mm with cystic changes.  Suspicion of an endometrial mass compatible with a polyp measured at 1 x 0.7 cm.  No vascularity seen.  Both ovaries are small with atrophic appearance.  No adnexal mass.  No free fluid in the posterior cul-de-sac.   Assessment/Plan:  87 y.o. G0P0000   1. Intermittent left lower quadrant abdominal pain Intermittent left lower quadrant abdominal pain for which a pelvic ultrasound was done today.  No pain currently.  Pelvic ultrasound findings thoroughly reviewed with patient.  Patient reassured that both ovaries are small and atrophic.  No free fluid in the pelvis.  The endometrial lining was thickened and cystic, compatible with an endometrial polyp.  Given that patient is G0 at 33 and add significant tenderness with a pelvic ultrasound, decision made to proceed directly to hysteroscopy with excision and D&C under anesthesia.  2. Thickened endometrium Pelvic US findings thoroughly reviewed with patient.  Thickened endometrium c/w a Polyp.   Decision to proceed with a HSC/D+C/Myosure Excision.  Surgery and risks reviewed with patient.  Pamphlet given.  We will schedule surgery and patient will follow up with preop.  3. Endometrial polyp As above.  4. Weight loss, abnormal  Important to rule out cancer.  Princess Bruins MD, 3:42 PM 10/21/2020

## 2020-10-21 NOTE — Telephone Encounter (Signed)
-----   Message from Princess Bruins, MD sent at 10/21/2020  3:40 PM EDT ----- Regarding: Schedule surgery Surgery:  Hysteroscopy, D+C, Myosure excision  Diagnosis: Thickened endometrium/Polyp  Location: [place]  Status: Outpatient  Time: 30 Minutes  Assistant: N/A  Urgency: First Available  Pre-Op Appointment: To Be Scheduled  Post-Op Appointment(s): 2 Weeks  Time Out Of Work: Day Of Surgery ONLY

## 2020-10-22 ENCOUNTER — Encounter: Payer: Self-pay | Admitting: Obstetrics & Gynecology

## 2020-10-22 NOTE — Telephone Encounter (Signed)
Spoke with patient. Reviewed surgery dates. Patient request to proceed with surgery on 11/25/20.  Patient declined earlier surgery dates offered. Advised patient I will forward to business office for return call. I will return call once surgery date and time confirmed. Patient verbalizes understanding and is agreeable.   Surgery request sent.   Routing to [employer]

## 2020-10-22 NOTE — Telephone Encounter (Signed)
Spoke with patient. Surgery date request confirmed.  Advised surgery is scheduled for 11/25/20 at 1000.  Surgery instruction sheet and hospital brochure reviewed, printed copy will be mailed.  Patient advised if Covid screening and quarantine requirements and agreeable.   Routing to provider. Encounter closed.  Cc: Hayley Carder

## 2020-10-22 NOTE — Telephone Encounter (Signed)
Spoke with patient regarding surgery benefits. Patient acknowledges understanding of information presented. Patient is aware that benefits presented are professional benefits only. Patient is aware that once surgery is scheduled, the hospital will call with separate benefits. See account note.  Routing to Jill Hamm, RN, for surgery scheduling. 

## 2020-10-27 ENCOUNTER — Telehealth: Payer: Self-pay | Admitting: *Deleted

## 2020-10-27 NOTE — Telephone Encounter (Signed)
Spoke with [facility] Scheduling.  Case rescheduled to 11/26/20 at 1105, [facility].   Call returned to patient.  Spoke with patient. Surgery date confirmed.  Advised surgery is re-scheduled for 11/26/20, 1105, [place]. Surgery instruction sheet and hospital brochure reviewed with updated arrival times.  Patient advised if Covid screening and quarantine requirements and agreeable.   Routing to provider. Encounter closed.   Cc: Hayley Carder

## 2020-10-27 NOTE — Telephone Encounter (Signed)
Call placed to patient.  Left detailed message, will need to r/s surgery scheduled for 11/25/20, provider out of office.  Offered 11/26/20.  Please return call to office ASAP to confirm.

## 2020-10-28 ENCOUNTER — Encounter: Payer: Self-pay | Admitting: Obstetrics & Gynecology

## 2020-10-29 ENCOUNTER — Encounter: Payer: Medicare PPO | Admitting: Obstetrics & Gynecology

## 2020-11-07 DIAGNOSIS — H353231 Exudative age-related macular degeneration, bilateral, with active choroidal neovascularization: Secondary | ICD-10-CM | POA: Diagnosis not present

## 2020-11-07 DIAGNOSIS — H05811 Cyst of right orbit: Secondary | ICD-10-CM | POA: Diagnosis not present

## 2020-11-07 DIAGNOSIS — H47293 Other optic atrophy, bilateral: Secondary | ICD-10-CM | POA: Diagnosis not present

## 2020-11-19 ENCOUNTER — Telehealth: Payer: Self-pay | Admitting: *Deleted

## 2020-11-19 ENCOUNTER — Encounter: Payer: Self-pay | Admitting: Obstetrics & Gynecology

## 2020-11-19 ENCOUNTER — Other Ambulatory Visit: Payer: Self-pay

## 2020-11-19 ENCOUNTER — Ambulatory Visit (INDEPENDENT_AMBULATORY_CARE_PROVIDER_SITE_OTHER): Payer: Medicare PPO | Admitting: Obstetrics & Gynecology

## 2020-11-19 VITALS — BP 118/72 | HR 75 | Resp 14 | Ht 63.25 in | Wt 112.0 lb

## 2020-11-19 DIAGNOSIS — R9389 Abnormal findings on diagnostic imaging of other specified body structures: Secondary | ICD-10-CM

## 2020-11-19 DIAGNOSIS — R634 Abnormal weight loss: Secondary | ICD-10-CM

## 2020-11-19 DIAGNOSIS — R1032 Left lower quadrant pain: Secondary | ICD-10-CM

## 2020-11-19 DIAGNOSIS — N840 Polyp of corpus uteri: Secondary | ICD-10-CM

## 2020-11-19 NOTE — Progress Notes (Addendum)
    Sierra Bentley [birth year] [id number]        86 y.o.  G0P0000   RP: Preop HSC/Myosure Excision/D+C on 11/26/2020  HPI: No change x last visit.  No PMB currently.  No pelvic pain.   OB History  Gravida Para Term Preterm AB Living  0 0 0 0 0 0  SAB IAB Ectopic Multiple Live Births  0 0 0 0 0  Obstetric Comments  HAS 2 STEP SONS     Past medical history,surgical history, problem list, medications, allergies, family history and social history were all reviewed and documented in the EPIC chart.   Directed ROS with pertinent positives and negatives documented in the history of present illness/assessment and plan.  Exam:  Vitals:   11/19/20 1144  BP: 118/72  Pulse: 75  Resp: 14  Weight: 112 lb (50.8 kg)  Height: 5' 3.25" (1.607 m)   General appearance:  Normal  Pelvic US 10/21/2020: T/V images.  Small anteverted uterus with a single subserous fibroid posteriorly measured at 2 cm.  The overall uterine size is 5.27 x 3.6 x 7.39 cm.  Thickened endometrial lining at 7 mm with cystic changes.  Suspicion of an endometrial mass compatible with a polyp measured at 1 x 0.7 cm.  No vascularity seen.  Both ovaries are small with atrophic appearance.  No adnexal mass.  No free fluid in the posterior cul-de-sac.     Assessment/Plan:  87 y.o. G0P0000    1. Intermittent left lower quadrant abdominal pain Intermittent left lower quadrant abdominal pain for which a pelvic ultrasound was done today.  No pain currently.  Pelvic ultrasound findings thoroughly reviewed with patient.  Patient reassured that both ovaries are small and atrophic.  No free fluid in the pelvis.  The endometrial lining was thickened and cystic, compatible with an endometrial polyp.  Given that patient is G0 at 63 and add significant tenderness with a pelvic ultrasound, decision made to proceed directly to hysteroscopy with excision and D&C under anesthesia.   2. Thickened endometrium Pelvic US findings thoroughly  reviewed with patient.  Thickened endometrium c/w a Polyp.  Decision to proceed with a HSC/D+C/Myosure Excision.  Surgery and risks reviewed with patient.  Pamphlet given.    3. Endometrial polyp As above.   4. Weight loss, abnormal  Important to rule out cancer.   Princess Bruins MD, 12:14 PM 11/19/2020

## 2020-11-19 NOTE — Telephone Encounter (Signed)
Spoke with patient.  Notified of surgery time change per pt request.  Surgery on 11/26/20, [facility] at 11am, arrive at 0900.   Patient verbalizes understanding and is is agreeable.   Encounter closed.

## 2020-11-20 ENCOUNTER — Other Ambulatory Visit: Payer: Self-pay

## 2020-11-20 ENCOUNTER — Encounter (HOSPITAL_BASED_OUTPATIENT_CLINIC_OR_DEPARTMENT_OTHER): Payer: Self-pay | Admitting: Obstetrics & Gynecology

## 2020-11-20 NOTE — Progress Notes (Signed)
Spoke w/ via phone for pre-op interview--- pt Lab needs dos----  no             Lab results------ pt getting lab work done 11-24-2020 CBC/ BMP/ EKG COVID test -----patient states asymptomatic no test needed Arrive at ------- 0900 on 11-26-2020 NPO after MN NO Solid Food.  Clear liquids from MN until--- 0800 Med rec completed Medications to take morning of surgery ----- protonix Diabetic medication ----- n/a Patient instructed no nail polish to be worn day of surgery Patient instructed to bring photo id and insurance card day of surgery Patient aware to have Driver (ride ) / caregiver for 24 hours after surgery -- husband , hoyt Patient Special Instructions ----- n/a Pre-Op special Istructions ----- n/a Patient verbalized understanding of instructions that were given at this phone interview. Patient denies shortness of breath, chest pain, fever, cough at this phone interview.

## 2020-11-21 ENCOUNTER — Encounter (HOSPITAL_BASED_OUTPATIENT_CLINIC_OR_DEPARTMENT_OTHER): Payer: Self-pay | Admitting: Obstetrics & Gynecology

## 2020-11-21 ENCOUNTER — Encounter: Payer: Self-pay | Admitting: Obstetrics & Gynecology

## 2020-11-24 ENCOUNTER — Other Ambulatory Visit: Payer: Self-pay

## 2020-11-24 ENCOUNTER — Encounter (HOSPITAL_COMMUNITY)
Admission: RE | Admit: 2020-11-24 | Discharge: 2020-11-24 | Disposition: A | Discharge status: Home / Self Care | Payer: Medicare PPO | Source: Ambulatory Visit | Attending: Obstetrics & Gynecology | Admitting: Obstetrics & Gynecology

## 2020-11-24 DIAGNOSIS — I10 Essential (primary) hypertension: Secondary | ICD-10-CM | POA: Diagnosis not present

## 2020-11-24 DIAGNOSIS — Z01818 Encounter for other preprocedural examination: Secondary | ICD-10-CM | POA: Insufficient documentation

## 2020-11-24 LAB — BASIC METABOLIC PANEL
Anion gap: 6 (ref 5–15)
BUN: 19 mg/dL (ref 8–23)
CO2: 29 mmol/L (ref 22–32)
Calcium: 9.2 mg/dL (ref 8.9–10.3)
Chloride: 104 mmol/L (ref 98–111)
Creatinine, Ser: 0.63 mg/dL (ref 0.44–1.00)
GFR, Estimated: >60 mL/min (ref >60)
Glucose, Bld: 89 mg/dL (ref 70–99)
Potassium: 4.2 mmol/L (ref 3.5–5.1)
Sodium: 139 mmol/L (ref 135–145)

## 2020-11-24 LAB — CBC
HCT: 37.5 % (ref 36.0–46.0)
Hemoglobin: 12.2 g/dL (ref 12.0–15.0)
MCH: 28.6 pg (ref 26.0–34.0)
MCHC: 32.5 g/dL (ref 30.0–36.0)
MCV: 87.8 fL (ref 80.0–100.0)
Platelets: 240 10*3/uL (ref 150–400)
RBC: 4.27 MIL/uL (ref 3.87–5.11)
RDW: 13.7 % (ref 11.5–15.5)
WBC: 5.1 10*3/uL (ref 4.0–10.5)
nRBC: 0 % (ref 0.0–0.2)

## 2020-11-26 ENCOUNTER — Other Ambulatory Visit: Payer: Self-pay

## 2020-11-26 ENCOUNTER — Encounter (HOSPITAL_BASED_OUTPATIENT_CLINIC_OR_DEPARTMENT_OTHER)
Admission: RE | Disposition: A | Discharge status: Home / Self Care | Payer: Self-pay | Source: Home / Self Care | Attending: Obstetrics & Gynecology

## 2020-11-26 ENCOUNTER — Ambulatory Visit (HOSPITAL_BASED_OUTPATIENT_CLINIC_OR_DEPARTMENT_OTHER): Payer: Medicare PPO | Admitting: Anesthesiology

## 2020-11-26 ENCOUNTER — Ambulatory Visit (HOSPITAL_BASED_OUTPATIENT_CLINIC_OR_DEPARTMENT_OTHER)
Admission: RE | Admit: 2020-11-26 | Discharge: 2020-11-26 | Disposition: A | Discharge status: Home / Self Care | Payer: Medicare PPO | Attending: Obstetrics & Gynecology | Admitting: Obstetrics & Gynecology

## 2020-11-26 ENCOUNTER — Encounter (HOSPITAL_BASED_OUTPATIENT_CLINIC_OR_DEPARTMENT_OTHER): Payer: Self-pay | Admitting: Obstetrics & Gynecology

## 2020-11-26 DIAGNOSIS — Z681 Body mass index (BMI) 19 or less, adult: Secondary | ICD-10-CM | POA: Diagnosis not present

## 2020-11-26 DIAGNOSIS — M810 Age-related osteoporosis without current pathological fracture: Secondary | ICD-10-CM | POA: Diagnosis not present

## 2020-11-26 DIAGNOSIS — R634 Abnormal weight loss: Secondary | ICD-10-CM | POA: Diagnosis not present

## 2020-11-26 DIAGNOSIS — N840 Polyp of corpus uteri: Secondary | ICD-10-CM | POA: Insufficient documentation

## 2020-11-26 DIAGNOSIS — R7303 Prediabetes: Secondary | ICD-10-CM | POA: Diagnosis not present

## 2020-11-26 DIAGNOSIS — R1032 Left lower quadrant pain: Secondary | ICD-10-CM | POA: Insufficient documentation

## 2020-11-26 DIAGNOSIS — R9389 Abnormal findings on diagnostic imaging of other specified body structures: Secondary | ICD-10-CM | POA: Diagnosis not present

## 2020-11-26 DIAGNOSIS — I10 Essential (primary) hypertension: Secondary | ICD-10-CM

## 2020-11-26 HISTORY — DX: Presence of spectacles and contact lenses: Z97.3

## 2020-11-26 HISTORY — DX: Anemia, unspecified: D64.9

## 2020-11-26 HISTORY — DX: Prediabetes: R73.03

## 2020-11-26 HISTORY — PX: DILATATION & CURETTAGE/HYSTEROSCOPY WITH MYOSURE: SHX6511

## 2020-11-26 HISTORY — DX: Unspecified hearing loss, bilateral: H91.93

## 2020-11-26 HISTORY — DX: Unspecified macular degeneration: H35.30

## 2020-11-26 HISTORY — DX: Diverticulosis of large intestine without perforation or abscess without bleeding: K57.30

## 2020-11-26 HISTORY — DX: Unspecified osteoarthritis, unspecified site: M19.90

## 2020-11-26 HISTORY — DX: Hyperlipidemia, unspecified: E78.5

## 2020-11-26 HISTORY — DX: Dry eye syndrome of bilateral lacrimal glands: H04.123

## 2020-11-26 HISTORY — DX: Urge incontinence: N39.41

## 2020-11-26 HISTORY — DX: Overactive bladder: N32.81

## 2020-11-26 HISTORY — DX: Personal history of other specified conditions: Z87.898

## 2020-11-26 HISTORY — DX: Endothelial corneal dystrophy, right eye: H18.511

## 2020-11-26 HISTORY — DX: Polyp of corpus uteri: N84.0

## 2020-11-26 HISTORY — DX: Abnormal findings on diagnostic imaging of other specified body structures: R93.89

## 2020-11-26 SURGERY — DILATATION & CURETTAGE/HYSTEROSCOPY WITH MYOSURE
Anesthesia: General | Site: Vagina

## 2020-11-26 MED ORDER — LACTATED RINGERS IV SOLN: INTRAVENOUS | Status: DC

## 2020-11-26 MED ORDER — FENTANYL CITRATE (PF) 100 MCG/2ML IJ SOLN
INTRAMUSCULAR | Status: AC
  Filled 2020-11-26: qty 2

## 2020-11-26 MED ORDER — CEFAZOLIN SODIUM-DEXTROSE 2-4 GM/100ML-% IV SOLN
2.0000 g | INTRAVENOUS | Status: AC
  Administered 2020-11-26: 2 g via INTRAVENOUS

## 2020-11-26 MED ORDER — LIDOCAINE 2% (20 MG/ML) 5 ML SYRINGE
INTRAMUSCULAR | Status: DC | PRN
  Administered 2020-11-26: 60 mg via INTRAVENOUS

## 2020-11-26 MED ORDER — CEFAZOLIN SODIUM-DEXTROSE 2-4 GM/100ML-% IV SOLN
INTRAVENOUS | Status: AC
  Filled 2020-11-26: qty 100

## 2020-11-26 MED ORDER — AMISULPRIDE (ANTIEMETIC) 5 MG/2ML IV SOLN: 10.0000 mg | Freq: Once | INTRAVENOUS | Status: DC | PRN

## 2020-11-26 MED ORDER — FENTANYL CITRATE (PF) 100 MCG/2ML IJ SOLN
INTRAMUSCULAR | Status: DC | PRN
  Administered 2020-11-26: 10 ug via INTRAVENOUS
  Administered 2020-11-26: 15 ug via INTRAVENOUS
  Administered 2020-11-26: 25 ug via INTRAVENOUS

## 2020-11-26 MED ORDER — ONDANSETRON HCL 4 MG/2ML IJ SOLN: 4.0000 mg | Freq: Once | INTRAMUSCULAR | Status: DC | PRN

## 2020-11-26 MED ORDER — FENTANYL CITRATE (PF) 100 MCG/2ML IJ SOLN
25.0000 ug | INTRAMUSCULAR | Status: DC | PRN
  Administered 2020-11-26: 25 ug via INTRAVENOUS

## 2020-11-26 MED ORDER — POVIDONE-IODINE 10 % EX SWAB: 2.0000 "application " | Freq: Once | CUTANEOUS | Status: DC

## 2020-11-26 MED ORDER — PROPOFOL 10 MG/ML IV BOLUS
INTRAVENOUS | Status: AC
  Filled 2020-11-26: qty 20

## 2020-11-26 MED ORDER — SODIUM CHLORIDE 0.9 % IR SOLN
Status: DC | PRN
  Administered 2020-11-26: 3000 mL

## 2020-11-26 MED ORDER — PHENYLEPHRINE 40 MCG/ML (10ML) SYRINGE FOR IV PUSH (FOR BLOOD PRESSURE SUPPORT)
PREFILLED_SYRINGE | INTRAVENOUS | Status: DC | PRN
  Administered 2020-11-26 (×3): 80 ug via INTRAVENOUS
  Administered 2020-11-26: 120 ug via INTRAVENOUS

## 2020-11-26 MED ORDER — LIDOCAINE HCL 1 % IJ SOLN
INTRAMUSCULAR | Status: DC | PRN
  Administered 2020-11-26: 10 mL

## 2020-11-26 MED ORDER — DEXAMETHASONE SODIUM PHOSPHATE 10 MG/ML IJ SOLN
INTRAMUSCULAR | Status: DC | PRN
  Administered 2020-11-26: 4 mg via INTRAVENOUS

## 2020-11-26 MED ORDER — ACETAMINOPHEN 500 MG PO TABS
1000.0000 mg | ORAL_TABLET | Freq: Once | ORAL | Status: AC
  Administered 2020-11-26: 1000 mg via ORAL

## 2020-11-26 MED ORDER — PROPOFOL 10 MG/ML IV BOLUS
INTRAVENOUS | Status: DC | PRN
  Administered 2020-11-26: 150 mg via INTRAVENOUS

## 2020-11-26 MED ORDER — ONDANSETRON HCL 4 MG/2ML IJ SOLN
INTRAMUSCULAR | Status: DC | PRN
  Administered 2020-11-26: 4 mg via INTRAVENOUS

## 2020-11-26 MED ORDER — ACETAMINOPHEN 500 MG PO TABS
ORAL_TABLET | ORAL | Status: AC
  Filled 2020-11-26: qty 2

## 2020-11-26 SURGICAL SUPPLY — 22 items
CATH ROBINSON RED A/P 16FR (CATHETERS) IMPLANT
DEVICE MYOSURE LITE (MISCELLANEOUS) IMPLANT
DEVICE MYOSURE REACH (MISCELLANEOUS) ×2 IMPLANT
DILATOR CANAL MILEX (MISCELLANEOUS) ×2 IMPLANT
ELECT REM PT RETURN 9FT ADLT (ELECTROSURGICAL)
ELECTRODE REM PT RTRN 9FT ADLT (ELECTROSURGICAL) IMPLANT
GAUZE 4X4 16PLY ~~LOC~~+RFID DBL (SPONGE) ×2 IMPLANT
GLOVE SURG ENC MOIS LTX SZ6.5 (GLOVE) ×2 IMPLANT
GLOVE SURG UNDER POLY LF SZ6.5 (GLOVE) ×2 IMPLANT
GLOVE SURG UNDER POLY LF SZ7 (GLOVE) ×4 IMPLANT
GLOVE SURG UNDER POLY LF SZ7.5 (GLOVE) ×2 IMPLANT
GOWN STRL REUS W/TWL LRG LVL3 (GOWN DISPOSABLE) ×4 IMPLANT
IV NS IRRIG 3000ML ARTHROMATIC (IV SOLUTION) ×2 IMPLANT
KIT PROCEDURE FLUENT (KITS) ×2 IMPLANT
KIT TURNOVER CYSTO (KITS) ×2 IMPLANT
MYOSURE XL FIBROID (MISCELLANEOUS)
PACK VAGINAL MINOR WOMEN LF (CUSTOM PROCEDURE TRAY) ×2 IMPLANT
PAD OB MATERNITY 4.3X12.25 (PERSONAL CARE ITEMS) ×2 IMPLANT
PAD PREP 24X48 CUFFED NSTRL (MISCELLANEOUS) ×2 IMPLANT
SEAL CERVICAL OMNI LOK (ABLATOR) IMPLANT
SEAL ROD LENS SCOPE MYOSURE (ABLATOR) ×2 IMPLANT
SYSTEM TISS REMOVAL MYOSURE XL (MISCELLANEOUS) IMPLANT

## 2020-11-26 NOTE — Discharge Instructions (Signed)
  Post Anesthesia Home Care Instructions  Activity: Get plenty of rest for the remainder of the day. A responsible individual must stay with you for 24 hours following the procedure.  For the next 24 hours, DO NOT: -Drive a car -Paediatric nurse -Drink alcoholic beverages -Take any medication unless instructed by your physician -Make any legal decisions or sign important papers.  Meals: Start with liquid foods such as gelatin or soup. Progress to regular foods as tolerated. Avoid greasy, spicy, heavy foods. If nausea and/or vomiting occur, drink only clear liquids until the nausea and/or vomiting subsides. Call your physician if vomiting continues.  Special Instructions/Symptoms: Your throat may feel dry or sore from the anesthesia or the breathing tube placed in your throat during surgery. If this causes discomfort, gargle with warm salt water. The discomfort should disappear within 24 hours.      No Tylenol (acetaminophen) until after 4:15 PM today if needed.    DISCHARGE INSTRUCTIONS: D&C / D&E The following instructions have been prepared to help you care for yourself upon your return home.   Personal hygiene:  Use sanitary pads for vaginal drainage, not tampons.  Shower the day after your procedure.  NO tub baths, pools or Jacuzzis for 2-3 weeks.  Wipe front to back after using the bathroom.  Activity and limitations:  Do NOT drive or operate any equipment for 24 hours. The effects of anesthesia are still present and drowsiness may result.  Do NOT rest in bed all day.  Walking is encouraged.  Walk up and down stairs slowly.  You may resume your normal activity in one to two days or as indicated by your physician.  Sexual activity: NO intercourse for at least 2 weeks after the procedure, or as indicated by your physician.  Diet: Eat a light meal as desired this evening. You may resume your usual diet tomorrow.  Return to work: You may resume your work activities in one  to two days or as indicated by your doctor.  What to expect after your surgery: Expect to have vaginal bleeding/discharge for 2-3 days and spotting for up to 10 days. It is not unusual to have soreness for up to 1-2 weeks. You may have a slight burning sensation when you urinate for the first day. Mild cramps may continue for a couple of days. You may have a regular period in 2-6 weeks.  Call your doctor for any of the following:  Excessive vaginal bleeding, saturating and changing one pad every hour.  Inability to urinate 6 hours after discharge from hospital.  Pain not relieved by pain medication.  Fever of 100.4 F or greater.  Unusual vaginal discharge or odor.   Call for an appointment.

## 2020-11-26 NOTE — Transfer of Care (Signed)
Immediate Anesthesia Transfer of Care Note  Patient: Sierra Bentley  Procedure(s) Performed: DILATATION & CURETTAGE/HYSTEROSCOPY WITH MYOSURE (Vagina )  Patient Location: PACU  Anesthesia Type:General  Level of Consciousness: awake, alert , oriented and patient cooperative  Airway & Oxygen Therapy: Patient Spontanous Breathing and Patient connected to nasal cannula oxygen  Post-op Assessment: Report given to RN and Post -op Vital signs reviewed and stable  Post vital signs: Reviewed and stable  Last Vitals:  Vitals Value Taken Time  BP    Temp    Pulse 69 11/26/20 1137  Resp 14 11/26/20 1137  SpO2 100 % 11/26/20 1137  Vitals shown include unvalidated device data.  Last Pain:  Vitals:   11/26/20 0926  TempSrc: Oral  PainSc: 0-No pain      Patients Stated Pain Goal: 2 (16/10/96 0454)  Complications: No notable events documented.

## 2020-11-26 NOTE — Anesthesia Postprocedure Evaluation (Signed)
Anesthesia Post Note  Patient: Sierra Bentley  Procedure(s) Performed: DILATATION & CURETTAGE/HYSTEROSCOPY WITH MYOSURE (Vagina )     Patient location during evaluation: PACU Anesthesia Type: General Level of consciousness: awake Pain management: pain level controlled Vital Signs Assessment: post-procedure vital signs reviewed and stable Respiratory status: spontaneous breathing, nonlabored ventilation, respiratory function stable and patient connected to nasal cannula oxygen Cardiovascular status: blood pressure returned to baseline and stable Postop Assessment: no apparent nausea or vomiting Anesthetic complications: no   No notable events documented.  Last Vitals:  Vitals:   11/26/20 1215 11/26/20 1245  BP: 136/71 (!) 148/71  Pulse: 70 73  Resp: 17 16  Temp:  36.6 C  SpO2: 100% 100%    Last Pain:  Vitals:   11/26/20 1245  TempSrc:   PainSc: 3                  Juquan Reznick P Jamie Hafford

## 2020-11-26 NOTE — H&P (Signed)
Sierra Bentley is an 87 y.o. female. G0   RP: HSC/Myosure Excision/D+C on 11/26/2020   HPI: No change x last visit.  No current PMB.  No pelvic pain.  Pertinent Gynecological History: Menses: post-menopausal Contraception: post menopausal status Blood transfusions: none Sexually transmitted diseases: no past history Last mammogram: normal  Last pap: normal   Menstrual History: No LMP recorded. Patient is postmenopausal.    Past Medical History:  Diagnosis Date   Allergic rhinitis, cause unspecified    Anemia    Chronically dry eyes, bilateral    Diverticulosis of colon    Endometrial polyp    Fuchs' corneal dystrophy of right eye    GERD (gastroesophageal reflux disease)    Hearing loss of both ears    pt stated has hearing aids but does not wear   History of epistaxis    per pt has had several cautization's for nose bleed   HTN (hypertension)    Hyperlipidemia    Macular degeneration of both eyes    followed @[facility]  by dr Mamie Nick. mettu;   both age-related and right is with active choroidal neovascularization   OA (osteoarthritis)    OAB (overactive bladder)    Osteopenia    Thickened endometrium    Urge incontinence of urine    Wears glasses     Past Surgical History:  Procedure Laterality Date   BREAST BIOPSY Left 2011   fibrocystic change   CATARACT EXTRACTION W/ INTRAOCULAR LENS IMPLANT Bilateral 2008   COLONOSCOPY  2013   TONSILLECTOMY  1962    Family History  Problem Relation Age of Onset   Heart attack Father 32   Coronary artery disease Father    Other Mother        Arrythmia   Coronary artery disease Mother    Hypertension Mother    Anxiety disorder Mother    Breast cancer Other        2nd cousin   Depression Other        family history    Social History:  reports that she has never smoked. She has never used smokeless tobacco. She reports that she does not drink alcohol and does not use drugs.  Allergies:  Allergies  Allergen Reactions    Ace Inhibitors Cough   Boniva [Ibandronic Acid]     Esophageal distress   Chocolate Other (See Comments)    Severe headache and stopped up sinuses   Fosamax [Alendronate]     Esophageal distress   Oxycodone Other (See Comments)    Dizziness resulting in fall     Medications Prior to Admission  Medication Sig Dispense Refill Last Dose   losartan-hydrochlorothiazide (HYZAAR) 50-12.5 MG tablet Take 0.5 tablets by mouth every evening. (Patient taking differently: Take 0.5 tablets by mouth every evening.) 45 tablet 3    Multiple Vitamin (MULTIVITAMIN) tablet Take 1 tablet by mouth daily. Per pt has Postmenopausal multivitamin packet with 5 pills   Past Week   pantoprazole (PROTONIX) 40 MG tablet Take 40 mg by mouth daily as needed. Per pt when needed takes in the evening  11    Polyethyl Glycol-Propyl Glycol (SYSTANE ULTRA OP) Place 1 drop into both eyes 4 (four) times daily.        sodium chloride (MURO 128) 5 % ophthalmic ointment Place 1 application into both eyes at bedtime.       TURMERIC PO Take 1,300 mg by mouth in the morning and at bedtime.   Past Week  REVIEW OF SYSTEMS: A ROS was performed and pertinent positives and negatives are included in the history.  GENERAL: No fevers or chills. HEENT: No change in vision, no earache, sore throat or sinus congestion. NECK: No pain or stiffness. CARDIOVASCULAR: No chest pain or pressure. No palpitations. PULMONARY: No shortness of breath, cough or wheeze. GASTROINTESTINAL: No abdominal pain, nausea, vomiting or diarrhea, melena or bright red blood per rectum. GENITOURINARY: No urinary frequency, urgency, hesitancy or dysuria. MUSCULOSKELETAL: No joint or muscle pain, no back pain, no recent trauma. DERMATOLOGIC: No rash, no itching, no lesions. ENDOCRINE: No polyuria, polydipsia, no heat or cold intolerance. No recent change in weight. HEMATOLOGICAL: No anemia or easy bruising or bleeding. NEUROLOGIC: No headache, seizures, numbness, tingling or  weakness. PSYCHIATRIC: No depression, no loss of interest in normal activity or change in sleep pattern.     There were no vitals taken for this visit.  Physical Exam:  Pelvic US 10/21/2020: T/V images.  Small anteverted uterus with a single subserous fibroid posteriorly measured at 2 cm.  The overall uterine size is 5.27 x 3.6 x 7.39 cm.  Thickened endometrial lining at 7 mm with cystic changes.  Suspicion of an endometrial mass compatible with a polyp measured at 1 x 0.7 cm.  No vascularity seen.  Both ovaries are small with atrophic appearance.  No adnexal mass.  No free fluid in the posterior cul-de-sac.     Assessment/Plan:  87 y.o. G0P0000    1. Intermittent left lower quadrant abdominal pain Intermittent left lower quadrant abdominal pain for which a pelvic ultrasound was done today.  No pain currently.  Pelvic ultrasound findings thoroughly reviewed with patient.  Patient reassured that both ovaries are small and atrophic.  No free fluid in the pelvis.  The endometrial lining was thickened and cystic, compatible with an endometrial polyp.  Given that patient is G0 at 60 and add significant tenderness with a pelvic ultrasound, decision made to proceed directly to hysteroscopy with excision and D&C under anesthesia.   2. Thickened endometrium Pelvic US findings thoroughly reviewed with patient.  Thickened endometrium c/w a Polyp.  Decision to proceed with a HSC/D+C/Myosure Excision.  Surgery and risks reviewed with patient.  Pamphlet given.    3. Endometrial polyp As above.   4. Weight loss, abnormal  Important to rule out cancer.                         Patient was counseled as to the risk of surgery to include the following:  1. Infection (prohylactic antibiotics will be administered)  2. DVT/Pulmonary Embolism (prophylactic pneumo compression stockings will be used)  3.Trauma to internal organs requiring additional surgical procedure to repair any injury to internal organs  requiring perhaps additional hospitalization days.  4.Hemmorhage requiring transfusion and blood products which carry risks such as anaphylactic reaction, hepatitis and AIDS  Patient had received literature information on the procedure scheduled and all her questions were answered and fully accepts all risk.   Sierra Bentley 11/26/2020, 9:24 AM

## 2020-11-26 NOTE — Anesthesia Preprocedure Evaluation (Addendum)
Anesthesia Evaluation  Patient identified by MRN, date of birth, ID band Patient awake    Reviewed: Allergy & Precautions, NPO status , Patient's Chart, lab work & pertinent test results  Airway Mallampati: II  TM Distance: >3 FB Neck ROM: Full    Dental no notable dental hx.    Pulmonary neg pulmonary ROS,    Pulmonary exam normal breath sounds clear to auscultation       Cardiovascular hypertension, Pt. on medications Normal cardiovascular exam Rhythm:Regular Rate:Normal  ECG: NSR, rate 70   Neuro/Psych negative neurological ROS  negative psych ROS   GI/Hepatic Neg liver ROS, GERD  Medicated and Controlled,  Endo/Other  negative endocrine ROS  Renal/GU negative Renal ROS     Musculoskeletal negative musculoskeletal ROS (+)   Abdominal   Peds  Hematology negative hematology ROS (+)   Anesthesia Other Findings Thickened endometrium Endometrial polyp  Reproductive/Obstetrics                            Anesthesia Physical Anesthesia Plan  ASA: 2  Anesthesia Plan: General   Post-op Pain Management:    Induction: Intravenous  PONV Risk Score and Plan: 3 and Ondansetron, Dexamethasone and Treatment may vary due to age or medical condition  Airway Management Planned: LMA  Additional Equipment:   Intra-op Plan:   Post-operative Plan: Extubation in OR  Informed Consent: I have reviewed the patients History and Physical, chart, labs and discussed the procedure including the risks, benefits and alternatives for the proposed anesthesia with the patient or authorized representative who has indicated his/her understanding and acceptance.     Dental advisory given  Plan Discussed with: CRNA  Anesthesia Plan Comments:         Anesthesia Quick Evaluation

## 2020-11-26 NOTE — Op Note (Addendum)
Operative Note  11/26/2020  11:24 AM  PATIENT:  Sierra Bentley  87 y.o. female  PRE-OPERATIVE DIAGNOSIS:  Thickened endometrium, endometrial polyp  POST-OPERATIVE DIAGNOSIS:  Thickened endometrium, endometrial polyp  PROCEDURE:  Procedure(s): DILATATION & CURETTAGE/HYSTEROSCOPY WITH MYOSURE  SURGEON:  Surgeon(s): Princess Bruins, MD  ANESTHESIA:   general  FINDINGS: Vascularized intra-uterine lesion at the right posterior lower uterine segment.  Polyp at the left endocervical canal.  DESCRIPTION OF OPERATION: Under general anesthesia with the laryngeal mask, the patient is in lithotomy position.  She is prepped with Betadine on the suprapubic, vulvar and vaginal areas.  She is draped as usual.  The bladder is catheterized.  Timeout is done.  The gynecologic exam reveals an anteverted uterus normal volume and mobile.  No adnexal mass.  The speculum is inserted in the vagina and the anterior lip of the cervix is grasped with a tenaculum.  A paracervical block is done with lidocaine 1% a total of 10 cc at 4 and 8:00.  The os finder is used to start dilation.  We then use Pratt dilators to dilate up to 21 without difficulty.  The hysteroscope is inserted in the intra uterine cavity.  Inspection reveals 2 normal ostia.  An irregular solid and vascularized lesion is present at the right lower uterine segment posteriorly.  It measures about 2 cm in diameter.  An endocervical polyp is present on the left side.  Pictures are taken.  We then inserted the MyoSure reach and completely excised the intra uterine lesion and the endocervical polyp.  Pictures are taken after excisions.  We then remove the hysteroscope with the MyoSure instrument.  An endometrial curettage is done on all intra uterine surfaces with a sharp curette.  The curettings and the excision material are sent together to pathology.  The curette is removed from the cavity.  The tenaculum is removed from the cervix.  Hemostasis is  adequate.  The speculum is removed.  The patient is brought to recovery room in good and stable status.  ESTIMATED BLOOD LOSS: 10 mL Fluid Deficit: 80 mL  Intake/Output Summary (Last 24 hours) at 11/26/2020 1124 Last data filed at 11/26/2020 1118 Gross per 24 hour  Intake --  Output 10 ml  Net -10 ml     BLOOD ADMINISTERED:none   LOCAL MEDICATIONS USED:  LIDOCAINE   SPECIMEN:  Source of Specimen:  Myosure Excision Material of intra-uterine lesions and Endometrial Curettings  DISPOSITION OF SPECIMEN:  PATHOLOGY  COUNTS:  YES  PLAN OF CARE: Transfer to PACU  Marie-Lyne LavoieMD11:24 AM

## 2020-11-26 NOTE — Anesthesia Procedure Notes (Signed)
Procedure Name: LMA Insertion Date/Time: 11/26/2020 10:55 AM Performed by: Georgeanne Nim, CRNA Pre-anesthesia Checklist: Patient identified, Emergency Drugs available, Suction available, Patient being monitored and Timeout performed Patient Re-evaluated:Patient Re-evaluated prior to induction Oxygen Delivery Method: Circle system utilized Preoxygenation: Pre-oxygenation with 100% oxygen Induction Type: IV induction Ventilation: Mask ventilation without difficulty LMA: LMA inserted LMA Size: 3.0 Number of attempts: 1 Placement Confirmation: positive ETCO2, CO2 detector and breath sounds checked- equal and bilateral Tube secured with: Tape Dental Injury: Teeth and Oropharynx as per pre-operative assessment

## 2020-11-27 ENCOUNTER — Encounter (HOSPITAL_BASED_OUTPATIENT_CLINIC_OR_DEPARTMENT_OTHER): Payer: Self-pay | Admitting: Obstetrics & Gynecology

## 2020-11-27 LAB — SURGICAL PATHOLOGY

## 2020-12-02 ENCOUNTER — Encounter: Payer: Self-pay | Admitting: Family Medicine

## 2020-12-03 ENCOUNTER — Ambulatory Visit: Payer: Medicare PPO | Admitting: Obstetrics & Gynecology

## 2020-12-03 ENCOUNTER — Other Ambulatory Visit: Payer: Self-pay

## 2020-12-03 ENCOUNTER — Encounter: Payer: Self-pay | Admitting: Obstetrics & Gynecology

## 2020-12-03 ENCOUNTER — Telehealth: Payer: Self-pay

## 2020-12-03 VITALS — BP 106/64 | Temp 98.4°F

## 2020-12-03 DIAGNOSIS — R350 Frequency of micturition: Secondary | ICD-10-CM | POA: Diagnosis not present

## 2020-12-03 MED ORDER — SULFAMETHOXAZOLE-TRIMETHOPRIM 800-160 MG PO TABS: 1.0000 | ORAL_TABLET | Freq: Two times a day (BID) | ORAL | 0 refills | Status: AC

## 2020-12-03 NOTE — Progress Notes (Signed)
    Sierra Bentley Nov 08, 1933 [id number]        87 y.o.  G0P0000   RP: Urinary frequency  HPI: Postop HSC/Myosure/D+C 11/26/2020, patho benign.  C/O urinary frequency with discomfort when passing urine.  No abnormal vaginal d/c or bleeding.  No fever.   OB History  Gravida Para Term Preterm AB Living  0 0 0 0 0 0  SAB IAB Ectopic Multiple Live Births  0 0 0 0 0  Obstetric Comments  HAS 2 STEP SONS     Past medical history,surgical history, problem list, medications, allergies, family history and social history were all reviewed and documented in the EPIC chart.   Directed ROS with pertinent positives and negatives documented in the history of present illness/assessment and plan.  Exam:  Vitals:   12/03/20 1628  BP: 106/64  Temp: 98.4 F (36.9 C)  TempSrc: Oral   General appearance:  Normal  CVAT Negative bilaterally  Abdomen: Normal  Gynecologic exam: Deferred  U/A:  Yellow cloudy, Pro 2+, Nit Neg, WBC >60, RBC 20-40, Bacteria many.  Urine Culture pending.   Assessment/Plan:  87 y.o. G0P0000   1. Urinary frequency U/A strongly abnormal, probable acute Cystitis.  Decision to treat with Bactrim DS.  Usage reviewed and prescription sent to pharmacy.  Push PO water intake. - Urinalysis,Complete w/RFL Culture  Other orders - latanoprost (XALATAN) 0.005 % ophthalmic solution; SMARTSIG:In Eye(s) - Urine Culture - REFLEXIVE URINE CULTURE - sulfamethoxazole-trimethoprim (BACTRIM DS) 800-160 MG tablet; Take 1 tablet by mouth 2 (two) times daily for 3 days.   Princess Bruins MD, 5:20 PM 12/03/2020

## 2020-12-03 NOTE — Telephone Encounter (Signed)
Had D&C, Hyst 11/26/20.  Pain with urination "throbbing like", frequency. Shortly after urinating feels like she needs to again.  Scheduled her for 4:15pm this afternoon to see Dr. Dellis Filbert.

## 2020-12-05 ENCOUNTER — Encounter: Payer: Self-pay | Admitting: Obstetrics & Gynecology

## 2020-12-06 LAB — URINALYSIS, COMPLETE W/RFL CULTURE
Bilirubin Urine: NEGATIVE
Glucose, UA: NEGATIVE
Hyaline Cast: NONE SEEN /LPF
Ketones, ur: NEGATIVE
Nitrites, Initial: NEGATIVE
Specific Gravity, Urine: 1.015 (ref 1.001–1.035)
WBC, UA: >=60 /HPF — AB (ref 0–5)
pH: 7.5 (ref 5.0–8.0)

## 2020-12-06 LAB — URINE CULTURE
MICRO NUMBER:: 12614984
SPECIMEN QUALITY:: ADEQUATE

## 2020-12-06 LAB — CULTURE INDICATED

## 2020-12-10 ENCOUNTER — Ambulatory Visit (INDEPENDENT_AMBULATORY_CARE_PROVIDER_SITE_OTHER): Payer: Medicare PPO | Admitting: Obstetrics & Gynecology

## 2020-12-10 ENCOUNTER — Encounter: Payer: Self-pay | Admitting: Obstetrics & Gynecology

## 2020-12-10 ENCOUNTER — Other Ambulatory Visit: Payer: Self-pay

## 2020-12-10 VITALS — BP 98/62 | HR 91

## 2020-12-10 DIAGNOSIS — M810 Age-related osteoporosis without current pathological fracture: Secondary | ICD-10-CM

## 2020-12-10 DIAGNOSIS — Z09 Encounter for follow-up examination after completed treatment for conditions other than malignant neoplasm: Secondary | ICD-10-CM

## 2020-12-10 NOTE — Progress Notes (Signed)
    Lynnell Fiumara Strawser 1933-07-11 [id number]        87 y.o.  G0  RP: Postop HSC/Myosure Excision/D+C 11/26/2020 and Osteoporosis management  HPI: Well post Bactrim DS for E.Coli Cystitis on 12/03/2020.  No pelvic pain.  No vaginal bleeding.  No vaginal d/c.  No fever.  BD 10/2020 Osteoporosis at bilateral Femur Necks and Lumbar AP Spine.  Taking Ca++/Vit D and physically active.   OB History  Gravida Para Term Preterm AB Living  0 0 0 0 0 0  SAB IAB Ectopic Multiple Live Births  0 0 0 0 0  Obstetric Comments  HAS 2 STEP SONS     Past medical history,surgical history, problem list, medications, allergies, family history and social history were all reviewed and documented in the EPIC chart.   Directed ROS with pertinent positives and negatives documented in the history of present illness/assessment and plan.  Exam:  Vitals:   12/10/20 1420  BP: 98/62  Pulse: 91  SpO2: 98%   General appearance:  Normal  Abdomen: Normal  Gynecologic exam: Vulva normal.  Uterus AV normal volume, mobile, NT.  No adnexal mass,NT.  No vaginal d/c.  Patho 11/26/2020: Benign endometrial Polyp and Endometrium. U. Culture 11/9 E.Coli sensitive to Bactrim, treated.   Assessment/Plan:  87 y.o. G0P0000   1. Status post gynecological surgery, follow-up exam Very good postop healing.  No Cx.  Patho benign, informed.  Treated for Cystitis E. Coli with Bactrim DS, sensitive.  No UTI Sx currently.  Will f/u at Annual Gyn exam.  2. Age-related osteoporosis without current pathological fracture  Osteoporosis with T-Score -2.5 at Bilateral Femurs and AP Lumbar Spine.  Decline medical treatment after counseling.  Continue with Vit D, Ca++ 1.5 g/d and regular weight bearing physical activity.  Princess Bruins MD, 2:49 PM 12/10/2020

## 2020-12-14 NOTE — Progress Notes (Signed)
Subjective:   Sierra Bentley is a 87 y.o. female who presents for Medicare Annual (Subsequent) preventive examination.  I connected with Alyse Low today by telephone and verified that I am speaking with the correct person using two identifiers. Location patient: home Location provider: work Persons participating in the virtual visit: patient, Marine scientist.    I discussed the limitations, risks, security and privacy concerns of performing an evaluation and management service by telephone and the availability of in person appointments. I also discussed with the patient that there may be a patient responsible charge related to this service. The patient expressed understanding and verbally consented to this telephonic visit.    Interactive audio and video telecommunications were attempted between this provider and patient, however failed, due to patient having technical difficulties OR patient did not have access to video capability.  We continued and completed visit with audio only.  Some vital signs may be absent or patient reported.   Time Spent with patient on telephone encounter: 25 minutes  Review of Systems     Cardiac Risk Factors include: advanced age (>14men, >35 women);hypertension;dyslipidemia     Objective:    Today's Vitals   12/16/20 1443 12/16/20 1444  Weight: 114 lb (51.7 kg)   Height: 5\' 3"  (1.6 m)   PainSc:  0-No pain   Body mass index is 20.19 kg/m.  Advanced Directives 12/16/2020 11/26/2020 12/11/2019 02/26/2018 02/25/2018 11/29/2017 09/15/2017  Does Patient Have a Medical Advance Directive? Yes Yes Yes No No No No  Type of Paramedic of [place];Living will - [place];Living will - - - -  Does patient want to make changes to medical advance directive? Yes (MAU/Ambulatory/Procedural Areas - Information given) No - Patient declined - - - - -  Copy of Healthcare Power of Attorney in Chart? - - No - copy requested - - - -   Would patient like information on creating a medical advance directive? - - - Yes (Inpatient - patient defers creating a medical advance directive at this time) No - Patient declined No - Patient declined No - Patient declined    Current Medications (verified) Outpatient Encounter Medications as of 12/16/2020  Medication Sig   latanoprost (XALATAN) 0.005 % ophthalmic solution SMARTSIG:In Eye(s)   losartan-hydrochlorothiazide (HYZAAR) 50-12.5 MG tablet Take 0.5 tablets by mouth every evening. (Patient taking differently: Take 0.5 tablets by mouth every evening.)   Multiple Vitamin (MULTIVITAMIN) tablet Take 1 tablet by mouth daily. Per pt has Postmenopausal multivitamin packet with 5 pills   pantoprazole (PROTONIX) 40 MG tablet Take 40 mg by mouth daily as needed. Per pt when needed takes in the evening   Polyethyl Glycol-Propyl Glycol (SYSTANE ULTRA OP) Place 1 drop into both eyes 4 (four) times daily.     sodium chloride (MURO 128) 5 % ophthalmic ointment Place 1 application into both eyes at bedtime.    TURMERIC PO Take 1,300 mg by mouth in the morning and at bedtime.   No facility-administered encounter medications on file as of 12/16/2020.    Allergies (verified) Ace inhibitors, Boniva [ibandronic acid], Chocolate, Fosamax [alendronate], and Oxycodone   History: Past Medical History:  Diagnosis Date   Allergic rhinitis, cause unspecified    Anemia    Chronically dry eyes, bilateral    Diverticulosis of colon    Endometrial polyp    Fuchs' corneal dystrophy of right eye    GERD (gastroesophageal reflux disease)    Hearing loss of both ears  pt stated has hearing aids but does not wear   History of epistaxis    per pt has had several cautization's for nose bleed   HTN (hypertension)    Hyperlipidemia    Macular degeneration of both eyes    followed @[facility]  by dr Mamie Nick. mettu;   both age-related and right is with active choroidal neovascularization   OA (osteoarthritis)    OAB  (overactive bladder)    Osteopenia    Thickened endometrium    Urge incontinence of urine    Wears glasses    Past Surgical History:  Procedure Laterality Date   BREAST BIOPSY Left 2011   fibrocystic change   CATARACT EXTRACTION W/ INTRAOCULAR LENS IMPLANT Bilateral 2008   COLONOSCOPY  2013   DILATATION & CURETTAGE/HYSTEROSCOPY WITH MYOSURE N/A 11/26/2020   Procedure: [place];  Surgeon: Princess Bruins, MD;  Location: [place];  Service: Gynecology;  Laterality: N/A;   TONSILLECTOMY  1962   Family History  Problem Relation Age of Onset   Heart attack Father 43   Coronary artery disease Father    Other Mother        Arrythmia   Coronary artery disease Mother    Hypertension Mother    Anxiety disorder Mother    Breast cancer Other        2nd cousin   Depression Other        family history   Social History   Socioeconomic History   Marital status: Married    Spouse name: Not on file   Number of children: Not on file   Years of education: Not on file   Highest education level: Not on file  Occupational History   Not on file  Tobacco Use   Smoking status: Never   Smokeless tobacco: Never  Vaping Use   Vaping Use: Never used  Substance and Sexual Activity   Alcohol use: No    Alcohol/week: 0.0 standard drinks   Drug use: Never   Sexual activity: Not Currently    Partners: Male    Birth control/protection: Post-menopausal  Other Topics Concern   Not on file  Social History Narrative   Married      No biological children (Has step children)      Taught HS x 30 years      Regular exercise         Social Determinants of Health   Financial Resource Strain: Low Risk    Difficulty of Paying Living Expenses: Not hard at all  Food Insecurity: No Food Insecurity   Worried About Charity fundraiser in the Last Year: Never true   [place] in the Last Year: Never true  Transportation Needs: No  Transportation Needs   Lack of Transportation (Medical): No   Lack of Transportation (Non-Medical): No  Physical Activity: Inactive   Days of Exercise per Week: 0 days   Minutes of Exercise per Session: 0 min  Stress: No Stress Concern Present   Feeling of Stress : Not at all  Social Connections: Moderately Isolated   Frequency of Communication with Friends and Family: Twice a week   Frequency of Social Gatherings with Friends and Family: Twice a week   Attends Religious Services: Never   Marine scientist or Organizations: No   Attends Archivist Meetings: Never   Marital Status: Married    Tobacco Counseling Counseling given: Not Answered   Clinical Intake:  Pre-visit  preparation completed: Yes  Pain : No/denies pain Pain Score: 0-No pain     BMI - recorded: 20.19 Nutritional Status: BMI of 19-24  Normal Nutritional Risks: None Diabetes: No  How often do you need to have someone help you when you read instructions, pamphlets, or other written materials from your doctor or pharmacy?: 1 - Never  Diabetic?No  Interpreter Needed?: No  Information entered by :: Orrin Brigham LPN   Activities of Daily Living In your present state of health, do you have any difficulty performing the following activities: 12/16/2020 11/26/2020  Hearing? N Y  Vision? N Y  Difficulty concentrating or making decisions? N N  Walking or climbing stairs? N Y  Dressing or bathing? N N  Doing errands, shopping? N -  Preparing Food and eating ? N -  Using the Toilet? N -  In the past six months, have you accidently leaked urine? Y -  Comment over active bladder -  Do you have problems with loss of bowel control? N -  Managing your Medications? N -  Managing your Finances? N -  Housekeeping or managing your Housekeeping? N -  Some recent data might be hidden    Patient Care Team: Tower, [name], MD as PCP - General Beverly Gust, MD as Referring Physician  (Otolaryngology) Shirlee More, MD as Referring Physician (Ophthalmology)  Indicate any recent Medical Services you may have received from other than [facility] providers in the past year (date may be approximate).     Assessment:   This is a routine wellness examination for Dhamar.  Hearing/Vision screen Hearing Screening - Comments:: No issues Vision Screening - Comments:: Last exam 09/2020, Dr. Jeneen Rinks  Dietary issues and exercise activities discussed: Current Exercise Habits: The patient does not participate in regular exercise at present   Goals Addressed             This Visit's Progress    Patient Stated       Would like to drink more water       Depression Screen PHQ 2/9 Scores 12/16/2020 12/11/2019 12/08/2018 11/29/2017 11/26/2016 08/29/2015 08/27/2014  PHQ - 2 Score 0 0 0 1 0 0 0  PHQ- 9 Score - 0 - 2 0 - -    Fall Risk Fall Risk  12/16/2020 12/11/2019 12/08/2018 08/23/2018 11/29/2017  Falls in the past year? 1 1 1 1 1   Comment - - - - fell while walking in hospital; fell on porch onto glider  Number falls in past yr: 0 0 1 1 1   Injury with Fall? 0 0 1 1 1   Comment - - - - injury to right knee  Risk Factor Category  - - - - -  Risk for fall due to : Impaired balance/gait Medication side effect - - History of fall(s);Impaired balance/gait  Follow up Falls prevention discussed Falls evaluation completed;Falls prevention discussed Falls evaluation completed - -    FALL RISK PREVENTION PERTAINING TO THE HOME:  Any stairs in or around the home? Yes  If so, are there any without handrails? No  Home free of loose throw rugs in walkways, pet beds, electrical cords, etc? Yes  Adequate lighting in your home to reduce risk of falls? Yes   ASSISTIVE DEVICES UTILIZED TO PREVENT FALLS:  Life alert? No  Use of a cane, walker or w/c? Yes  , cane Grab bars in the bathroom? Yes  Shower chair or bench in shower? No  Elevated toilet seat or a  handicapped toilet? Yes    TIMED UP AND GO:  Was the test performed? No , visit completed over the phone.    Cognitive Function: Normal cognitive status assessed by this Nurse Health Advisor. No abnormalities found.   MMSE - Mini Mental State Exam 12/11/2019 11/29/2017 11/26/2016 08/29/2015  Orientation to time 5 5 5 5   Orientation to Place 5 5 5 5   Registration 3 3 3 3   Attention/ Calculation 5 0 0 0  Recall 3 3 3 2   Recall-comments - - - pt was unable to recall 1 of 3 words  Language- name 2 objects - 0 0 0  Language- repeat 1 1 1 1   Language- follow 3 step command - 3 3 3   Language- read & follow direction - 0 0 0  Write a sentence - 0 0 0  Copy design - 0 0 0  Total score - 20 20 19         Immunizations Immunization History  Administered Date(s) Administered   Fluad Quad(high Dose 65+) 11/14/2018, 12/17/2019   Influenza Split 11/30/2010, 12/16/2011   Influenza Whole 10/26/2007   Influenza,inj,Quad PF,6+ Mos 11/09/2012, 11/16/2013, 01/03/2015, 11/06/2015, 11/26/2016, 12/02/2017   Moderna Sars-Covid-2 Vaccination 03/13/2019, 04/10/2019   Pneumococcal Conjugate-13 07/25/2013   Pneumococcal Polysaccharide-23 11/30/2010   Td 01/25/2005, 05/17/2016   Zoster Recombinat (Shingrix) 09/03/2020   Zoster, Live 03/29/2008    TDAP status: Up to date  Flu Vaccine status: Due, Education has been provided regarding the importance of this vaccine. Advised may receive this vaccine at local pharmacy or Health Dept. Aware to provide a copy of the vaccination record if obtained from local pharmacy or Health Dept. Verbalized acceptance and understanding.  Pneumococcal vaccine status: Up to date  Covid-19 vaccine status: Declined, Education has been provided regarding the importance of this vaccine but patient still declined. Advised may receive this vaccine at local pharmacy or Health Dept.or vaccine clinic. Aware to provide a copy of the vaccination record if obtained from local pharmacy or Health Dept. Verbalized  acceptance and understanding.  Qualifies for Shingles Vaccine? Yes   Zostavax completed Yes   Shingrix completed: first dose completed on 09/03/20  Screening Tests Health Maintenance  Topic Date Due   COVID-19 Vaccine (3 - Moderna risk series) 05/08/2019   INFLUENZA VACCINE  08/25/2020   Zoster Vaccines- Shingrix (2 of 2) 10/29/2020   MAMMOGRAM  01/09/2021   TETANUS/TDAP  05/18/2026   Pneumonia Vaccine 39+ Years old  Completed   DEXA SCAN  Completed   HPV VACCINES  Aged Out    Health Maintenance  Health Maintenance Due  Topic Date Due   COVID-19 Vaccine (3 - Moderna risk series) 05/08/2019   INFLUENZA VACCINE  08/25/2020   Zoster Vaccines- Shingrix (2 of 2) 10/29/2020    Colorectal cancer screening: No longer required.   Mammogram status: Ordered for 12/2020. Pt provided with contact info and advised to call to schedule appt.  , Due, patient has an upcoming appointment.   Bone Density status: Completed 10/28/20. Results reflect: Bone density results: OSTEOPOROSIS. Repeat every 2 years.  Lung Cancer Screening: (Low Dose CT Chest recommended if Age 69-80 years, 30 pack-year currently smoking OR have quit w/in 15years.) does not qualify.     Additional Screening:  Hepatitis C Screening: does not qualify;   Vision Screening: Recommended annual ophthalmology exams for early detection of glaucoma and other disorders of the eye. Is the patient up to date with their annual eye exam?  Yes  Who  is the provider or what is the name of the office in which the patient attends annual eye exams? Dr. Jeneen Rinks   Dental Screening: Recommended annual dental exams for proper oral hygiene  Community Resource Referral / Chronic Care Management: CRR required this visit?  No   CCM required this visit?  No      Plan:     I have personally reviewed and noted the following in the patient's chart:   Medical and social history Use of alcohol, tobacco or illicit drugs  Current medications  and supplements including opioid prescriptions.  Functional ability and status Nutritional status Physical activity Advanced directives List of other physicians Hospitalizations, surgeries, and ER visits in previous 12 months Vitals Screenings to include cognitive, depression, and falls Referrals and appointments  In addition, I have reviewed and discussed with patient certain preventive protocols, quality metrics, and best practice recommendations. A written personalized care plan for preventive services as well as general preventive health recommendations were provided to patient.   Due to this being a telephonic visit, the after visit summary with patients personalized plan was offered to patient via mail or my-chart. Patient preferred to pick up at office at next visit.   Loma Messing, LPN  [id number]   Nurse Health Advisor  Nurse Notes: none

## 2020-12-15 ENCOUNTER — Other Ambulatory Visit: Payer: Self-pay | Admitting: Otolaryngology

## 2020-12-15 ENCOUNTER — Ambulatory Visit: Payer: Medicare PPO | Admitting: Obstetrics & Gynecology

## 2020-12-15 ENCOUNTER — Telehealth: Payer: Self-pay | Admitting: Family Medicine

## 2020-12-15 DIAGNOSIS — Z0000 Encounter for general adult medical examination without abnormal findings: Secondary | ICD-10-CM

## 2020-12-15 DIAGNOSIS — E7800 Pure hypercholesterolemia, unspecified: Secondary | ICD-10-CM

## 2020-12-15 DIAGNOSIS — M810 Age-related osteoporosis without current pathological fracture: Secondary | ICD-10-CM

## 2020-12-15 DIAGNOSIS — R42 Dizziness and giddiness: Secondary | ICD-10-CM | POA: Diagnosis not present

## 2020-12-15 DIAGNOSIS — R059 Cough, unspecified: Secondary | ICD-10-CM | POA: Diagnosis not present

## 2020-12-15 DIAGNOSIS — J019 Acute sinusitis, unspecified: Secondary | ICD-10-CM | POA: Diagnosis not present

## 2020-12-15 DIAGNOSIS — R7303 Prediabetes: Secondary | ICD-10-CM

## 2020-12-15 DIAGNOSIS — I10 Essential (primary) hypertension: Secondary | ICD-10-CM

## 2020-12-15 DIAGNOSIS — R519 Headache, unspecified: Secondary | ICD-10-CM

## 2020-12-15 DIAGNOSIS — R634 Abnormal weight loss: Secondary | ICD-10-CM

## 2020-12-15 NOTE — Telephone Encounter (Signed)
-----   Message from Ellamae Sia sent at 12/02/2020  3:31 PM EST ----- Regarding: Lab orders for Tuesday, 11.22.22 Patient is scheduled for CPX labs, please order future labs, Thanks , Karna Christmas

## 2020-12-16 ENCOUNTER — Other Ambulatory Visit: Payer: Self-pay

## 2020-12-16 ENCOUNTER — Other Ambulatory Visit: Payer: Self-pay | Admitting: Otolaryngology

## 2020-12-16 ENCOUNTER — Other Ambulatory Visit: Payer: Medicare PPO

## 2020-12-16 ENCOUNTER — Ambulatory Visit
Admission: RE | Admit: 2020-12-16 | Discharge: 2020-12-16 | Disposition: A | Discharge status: Home / Self Care | Payer: Medicare PPO | Source: Ambulatory Visit | Attending: Otolaryngology | Admitting: Otolaryngology

## 2020-12-16 ENCOUNTER — Ambulatory Visit (INDEPENDENT_AMBULATORY_CARE_PROVIDER_SITE_OTHER): Payer: Medicare PPO

## 2020-12-16 ENCOUNTER — Ambulatory Visit
Admission: RE | Admit: 2020-12-16 | Discharge: 2020-12-16 | Disposition: A | Discharge status: Home / Self Care | Payer: Medicare PPO | Attending: Otolaryngology | Admitting: Otolaryngology

## 2020-12-16 VITALS — Ht 63.0 in | Wt 114.0 lb

## 2020-12-16 DIAGNOSIS — R634 Abnormal weight loss: Secondary | ICD-10-CM | POA: Diagnosis not present

## 2020-12-16 DIAGNOSIS — R059 Cough, unspecified: Secondary | ICD-10-CM | POA: Diagnosis not present

## 2020-12-16 DIAGNOSIS — Z0000 Encounter for general adult medical examination without abnormal findings: Secondary | ICD-10-CM | POA: Diagnosis not present

## 2020-12-16 DIAGNOSIS — R0602 Shortness of breath: Secondary | ICD-10-CM | POA: Diagnosis not present

## 2020-12-16 NOTE — Patient Instructions (Signed)
[name] , Thank you for taking time to complete your Medicare Wellness Visit. I appreciate your ongoing commitment to your health goals. Please review the following plan we discussed and let me know if I can assist you in the future.   Screening recommendations/referrals: Colonoscopy: no longer required Mammogram: due, last completed 01/10/20, per our conversation you have a scheduled appointment next month Bone Density: up to date , completed 10/28/20, due 10/29/22 Recommended yearly ophthalmology/optometry visit for glaucoma screening and checkup Recommended yearly dental visit for hygiene and checkup  Vaccinations: Influenza vaccine: Due-May obtain vaccine at our office or your local pharmacy.  Pneumococcal vaccine: up to date Tdap vaccine: up to date, completed 05/17/16, due 05/18/26 Shingles vaccine: up to date, please bring vaccine information to your next appointment   Covid-19: newest booster available at your local pharmacy if you change your mind.  Advanced directives: Please bring a copy of Living Will and/or Healthcare Power of Attorney for your chart.   Conditions/risks identified: see problem list  Next appointment: Follow up in one year for your annual wellness visit    Preventive Care 65 Years and Older, Female Preventive care refers to lifestyle choices and visits with your health care provider that can promote health and wellness. What does preventive care include? A yearly physical exam. This is also called an annual well check. Dental exams once or twice a year. Routine eye exams. Ask your health care provider how often you should have your eyes checked. Personal lifestyle choices, including: Daily care of your teeth and gums. Regular physical activity. Eating a healthy diet. Avoiding tobacco and drug use. Limiting alcohol use. Practicing safe sex. Taking low-dose aspirin every day. Taking vitamin and mineral supplements as recommended by your health care  provider. What happens during an annual well check? The services and screenings done by your health care provider during your annual well check will depend on your age, overall health, lifestyle risk factors, and family history of disease. Counseling  Your health care provider may ask you questions about your: Alcohol use. Tobacco use. Drug use. Emotional well-being. Home and relationship well-being. Sexual activity. Eating habits. History of falls. Memory and ability to understand (cognition). Work and work Statistician. Reproductive health. Screening  You may have the following tests or measurements: Height, weight, and BMI. Blood pressure. Lipid and cholesterol levels. These may be checked every 5 years, or more frequently if you are over 59 years old. Skin check. Lung cancer screening. You may have this screening every year starting at age 80 if you have a 30-pack-year history of smoking and currently smoke or have quit within the past 15 years. Fecal occult blood test (FOBT) of the stool. You may have this test every year starting at age 30. Flexible sigmoidoscopy or colonoscopy. You may have a sigmoidoscopy every 5 years or a colonoscopy every 10 years starting at age 39. Hepatitis C blood test. Hepatitis B blood test. Sexually transmitted disease (STD) testing. Diabetes screening. This is done by checking your blood sugar (glucose) after you have not eaten for a while (fasting). You may have this done every 1-3 years. Bone density scan. This is done to screen for osteoporosis. You may have this done starting at age 29. Mammogram. This may be done every 1-2 years. Talk to your health care provider about how often you should have regular mammograms. Talk with your health care provider about your test results, treatment options, and if necessary, the need for more tests. Vaccines  Your health care provider may recommend certain vaccines, such as: Influenza vaccine. This is  recommended every year. Tetanus, diphtheria, and acellular pertussis (Tdap, Td) vaccine. You may need a Td booster every 10 years. Zoster vaccine. You may need this after age 22. Pneumococcal 13-valent conjugate (PCV13) vaccine. One dose is recommended after age 93. Pneumococcal polysaccharide (PPSV23) vaccine. One dose is recommended after age 41. Talk to your health care provider about which screenings and vaccines you need and how often you need them. This information is not intended to replace advice given to you by your health care provider. Make sure you discuss any questions you have with your health care provider. Document Released: 02/07/2015 Document Revised: 10/01/2015 Document Reviewed: 11/12/2014 Elsevier Interactive Patient Education  2017 [place] Prevention in the Home Falls can cause injuries. They can happen to people of all ages. There are many things you can do to make your home safe and to help prevent falls. What can I do on the outside of my home? Regularly fix the edges of walkways and driveways and fix any cracks. Remove anything that might make you trip as you walk through a door, such as a raised step or threshold. Trim any bushes or trees on the path to your home. Use bright outdoor lighting. Clear any walking paths of anything that might make someone trip, such as rocks or tools. Regularly check to see if handrails are loose or broken. Make sure that both sides of any steps have handrails. Any raised decks and porches should have guardrails on the edges. Have any leaves, snow, or ice cleared regularly. Use sand or salt on walking paths during winter. Clean up any spills in your garage right away. This includes oil or grease spills. What can I do in the bathroom? Use night lights. Install grab bars by the toilet and in the tub and shower. Do not use towel bars as grab bars. Use non-skid mats or decals in the tub or shower. If you need to sit down in  the shower, use a plastic, non-slip stool. Keep the floor dry. Clean up any water that spills on the floor as soon as it happens. Remove soap buildup in the tub or shower regularly. Attach bath mats securely with double-sided non-slip rug tape. Do not have throw rugs and other things on the floor that can make you trip. What can I do in the bedroom? Use night lights. Make sure that you have a light by your bed that is easy to reach. Do not use any sheets or blankets that are too big for your bed. They should not hang down onto the floor. Have a firm chair that has side arms. You can use this for support while you get dressed. Do not have throw rugs and other things on the floor that can make you trip. What can I do in the kitchen? Clean up any spills right away. Avoid walking on wet floors. Keep items that you use a lot in easy-to-reach places. If you need to reach something above you, use a strong step stool that has a grab bar. Keep electrical cords out of the way. Do not use floor polish or wax that makes floors slippery. If you must use wax, use non-skid floor wax. Do not have throw rugs and other things on the floor that can make you trip. What can I do with my stairs? Do not leave any items on the stairs. Make sure that there are  handrails on both sides of the stairs and use them. Fix handrails that are broken or loose. Make sure that handrails are as long as the stairways. Check any carpeting to make sure that it is firmly attached to the stairs. Fix any carpet that is loose or worn. Avoid having throw rugs at the top or bottom of the stairs. If you do have throw rugs, attach them to the floor with carpet tape. Make sure that you have a light switch at the top of the stairs and the bottom of the stairs. If you do not have them, ask someone to add them for you. What else can I do to help prevent falls? Wear shoes that: Do not have high heels. Have rubber bottoms. Are comfortable  and fit you well. Are closed at the toe. Do not wear sandals. If you use a stepladder: Make sure that it is fully opened. Do not climb a closed stepladder. Make sure that both sides of the stepladder are locked into place. Ask someone to hold it for you, if possible. Clearly mark and make sure that you can see: Any grab bars or handrails. First and last steps. Where the edge of each step is. Use tools that help you move around (mobility aids) if they are needed. These include: Canes. Walkers. Scooters. Crutches. Turn on the lights when you go into a dark area. Replace any light bulbs as soon as they burn out. Set up your furniture so you have a clear path. Avoid moving your furniture around. If any of your floors are uneven, fix them. If there are any pets around you, be aware of where they are. Review your medicines with your doctor. Some medicines can make you feel dizzy. This can increase your chance of falling. Ask your doctor what other things that you can do to help prevent falls. This information is not intended to replace advice given to you by your health care provider. Make sure you discuss any questions you have with your health care provider. Document Released: 11/07/2008 Document Revised: 06/19/2015 Document Reviewed: 02/15/2014 Elsevier Interactive Patient Education  2017 [employer].

## 2020-12-17 ENCOUNTER — Other Ambulatory Visit: Payer: Self-pay | Admitting: Family Medicine

## 2020-12-22 DIAGNOSIS — H18513 Endothelial corneal dystrophy, bilateral: Secondary | ICD-10-CM | POA: Diagnosis not present

## 2020-12-22 DIAGNOSIS — H35363 Drusen (degenerative) of macula, bilateral: Secondary | ICD-10-CM | POA: Diagnosis not present

## 2020-12-22 DIAGNOSIS — H538 Other visual disturbances: Secondary | ICD-10-CM | POA: Diagnosis not present

## 2020-12-22 DIAGNOSIS — H353123 Nonexudative age-related macular degeneration, left eye, advanced atrophic without subfoveal involvement: Secondary | ICD-10-CM | POA: Diagnosis not present

## 2020-12-22 DIAGNOSIS — H3589 Other specified retinal disorders: Secondary | ICD-10-CM | POA: Diagnosis not present

## 2020-12-22 DIAGNOSIS — H353231 Exudative age-related macular degeneration, bilateral, with active choroidal neovascularization: Secondary | ICD-10-CM | POA: Diagnosis not present

## 2020-12-23 ENCOUNTER — Encounter: Payer: Medicare PPO | Admitting: Family Medicine

## 2020-12-29 ENCOUNTER — Ambulatory Visit
Admission: RE | Admit: 2020-12-29 | Discharge: 2020-12-29 | Disposition: A | Discharge status: Home / Self Care | Payer: Medicare PPO | Source: Ambulatory Visit | Attending: Otolaryngology | Admitting: Otolaryngology

## 2020-12-29 ENCOUNTER — Other Ambulatory Visit: Payer: Self-pay

## 2020-12-29 DIAGNOSIS — R519 Headache, unspecified: Secondary | ICD-10-CM

## 2020-12-29 DIAGNOSIS — R634 Abnormal weight loss: Secondary | ICD-10-CM | POA: Diagnosis not present

## 2020-12-29 DIAGNOSIS — R42 Dizziness and giddiness: Secondary | ICD-10-CM | POA: Diagnosis not present

## 2020-12-29 DIAGNOSIS — H9190 Unspecified hearing loss, unspecified ear: Secondary | ICD-10-CM | POA: Diagnosis not present

## 2020-12-29 MED ORDER — GADOBUTROL 1 MMOL/ML IV SOLN
5.0000 mL | Freq: Once | INTRAVENOUS | Status: AC | PRN
  Administered 2020-12-29: 5 mL via INTRAVENOUS

## 2021-01-12 DIAGNOSIS — Z1231 Encounter for screening mammogram for malignant neoplasm of breast: Secondary | ICD-10-CM | POA: Diagnosis not present

## 2021-01-15 ENCOUNTER — Encounter: Payer: Self-pay | Admitting: Obstetrics & Gynecology

## 2021-01-20 ENCOUNTER — Other Ambulatory Visit: Payer: Medicare PPO

## 2021-01-23 ENCOUNTER — Telehealth: Payer: Medicare PPO | Admitting: Physician Assistant

## 2021-01-23 DIAGNOSIS — U071 COVID-19: Secondary | ICD-10-CM | POA: Diagnosis not present

## 2021-01-23 MED ORDER — NIRMATRELVIR/RITONAVIR (PAXLOVID)TABLET: 3.0000 | ORAL_TABLET | Freq: Two times a day (BID) | ORAL | 0 refills | Status: AC

## 2021-01-23 MED ORDER — BENZONATATE 100 MG PO CAPS: 100.0000 mg | ORAL_CAPSULE | Freq: Three times a day (TID) | ORAL | 0 refills | Status: DC | PRN

## 2021-01-23 NOTE — Progress Notes (Signed)
Virtual Visit Consent   [name], you are scheduled for a virtual visit with a [facility] provider today.     Just as with appointments in the office, your consent must be obtained to participate.  Your consent will be active for this visit and any virtual visit you may have with one of our providers in the next 365 days.     If you have a MyChart account, a copy of this consent can be sent to you electronically.  All virtual visits are billed to your insurance company just like a traditional visit in the office.    As this is a virtual visit, video technology does not allow for your provider to perform a traditional examination.  This may limit your provider's ability to fully assess your condition.  If your provider identifies any concerns that need to be evaluated in person or the need to arrange testing (such as labs, EKG, etc.), we will make arrangements to do so.     Although advances in technology are sophisticated, we cannot ensure that it will always work on either your end or our end.  If the connection with a video visit is poor, the visit may have to be switched to a telephone visit.  With either a video or telephone visit, we are not always able to ensure that we have a secure connection.     I need to obtain your verbal consent now.   Are you willing to proceed with your visit today?    [name] has provided verbal consent on 01/23/2021 for a virtual visit (video or telephone).   Sierra Bentley, [place]   Date: 01/23/2021 7:02 PM   Virtual Visit via Video Note   I, Sierra Bentley, connected with  BENNETTA RUDDEN  ([id number], September 07, 1933) on 01/23/21 at  7:15 PM EST by a video-enabled telemedicine application and verified that I am speaking with the correct person using two identifiers.  Location: Patient: Virtual Visit Location Patient: Home Provider: Virtual Visit Location Provider: Home Office   I discussed the limitations of evaluation and  management by telemedicine and the availability of in person appointments. The patient expressed understanding and agreed to proceed.    History of Present Illness: Sierra Bentley is a 87 y.o. who identifies as a female who was assigned female at birth, and is being seen today for COVID-19. Notes today starting with headache, fatigue and sore throat. Took a home COVID test which was positive. Denies any chest pain or any significant shortness of breath. Denies nausea or diarrhea.    HPI: HPI  Problems:  Patient Active Problem List   Diagnosis Date Noted   Lump of left thigh 05/07/2019   Poor balance 05/07/2019   Vestibular dizziness 03/31/2018   History of hip fracture 02/27/2018   Pedal edema 09/28/2017   Fall at home 09/28/2017   Prediabetes 11/30/2016   Toenail fungus 10/03/2015   At moderate risk for fall 09/05/2015   Cervical spondylosis without myelopathy 08/05/2015   Routine general medical examination at a health care facility 08/27/2014   Colon cancer screening 08/27/2014   Fall against object 08/15/2014   Encounter for Medicare annual wellness exam 07/25/2013   Varicosities of leg 05/23/2012   Abnormal ultrasound of thyroid gland 03/01/2011   Hyperlipidemia 06/11/2008   Allergic rhinitis 03/29/2008   Essential hypertension, benign 12/26/2006   GERD 12/26/2006   Rheumatoid arthritis ([place]) 12/26/2006   Osteoporosis 12/26/2006   URINARY INCONTINENCE  12/26/2006    Allergies:  Allergies  Allergen Reactions   Ace Inhibitors Cough   Boniva [Ibandronic Acid]     Esophageal distress   Chocolate Other (See Comments)    Severe headache and stopped up sinuses   Fosamax [Alendronate]     Esophageal distress   Oxycodone Other (See Comments)    Dizziness resulting in fall    Medications:  Current Outpatient Medications:    benzonatate (TESSALON) 100 MG capsule, Take 1 capsule (100 mg total) by mouth 3 (three) times daily as needed for cough., Disp: 30 capsule, Rfl: 0    nirmatrelvir/ritonavir EUA (PAXLOVID) 20 x 150 MG & 10 x 100MG  TABS, Take 3 tablets by mouth 2 (two) times daily for 5 days. (Take nirmatrelvir 150 mg two tablets twice daily for 5 days and ritonavir 100 mg one tablet twice daily for 5 days) Patient GFR is >60, Disp: 30 tablet, Rfl: 0   latanoprost (XALATAN) 0.005 % ophthalmic solution, SMARTSIG:In Eye(s), Disp: , Rfl:    losartan-hydrochlorothiazide (HYZAAR) 50-12.5 MG tablet, TAKE 1/2 TABLET BY MOUTH EVERY EVENING, Disp: 45 tablet, Rfl: 0   Multiple Vitamin (MULTIVITAMIN) tablet, Take 1 tablet by mouth daily. Per pt has Postmenopausal multivitamin packet with 5 pills, Disp: , Rfl:    pantoprazole (PROTONIX) 40 MG tablet, Take 40 mg by mouth daily as needed. Per pt when needed takes in the evening, Disp: , Rfl: 11   Polyethyl Glycol-Propyl Glycol (SYSTANE ULTRA OP), Place 1 drop into both eyes 4 (four) times daily.  , Disp: , Rfl:    sodium chloride (MURO 128) 5 % ophthalmic ointment, Place 1 application into both eyes at bedtime. , Disp: , Rfl:    TURMERIC PO, Take 1,300 mg by mouth in the morning and at bedtime., Disp: , Rfl:   Observations/Objective: Patient is well-developed, well-nourished in no acute distress.  Resting comfortably at home.  Head is normocephalic, atraumatic.  No labored breathing. Speech is clear and coherent with logical content.  Patient is alert and oriented at baseline.   Assessment and Plan: 1. COVID-19 - benzonatate (TESSALON) 100 MG capsule; Take 1 capsule (100 mg total) by mouth 3 (three) times daily as needed for cough.  Dispense: 30 capsule; Refill: 0 - nirmatrelvir/ritonavir EUA (PAXLOVID) 20 x 150 MG & 10 x 100MG  TABS; Take 3 tablets by mouth 2 (two) times daily for 5 days. (Take nirmatrelvir 150 mg two tablets twice daily for 5 days and ritonavir 100 mg one tablet twice daily for 5 days) Patient GFR is >60  Dispense: 30 tablet; Refill: 0  Patient with multiple risk factors for complicated course of illness.  Discussed risks/benefits of antiviral medications including most common potential ADRs. Patient voiced understanding and would like to proceed with antiviral medication. They are candidate for paxlovid. GFR > 60 on recent BMP. Rx sent to pharmacy. Supportive measures, OTC medications and vitamin regimen reviewed. Tessalon per orders. Patient has been enrolled in a MyChart COVID symptom monitoring program. Samule Dry reviewed in detail. Strict ER precautions discussed with patient.    Follow Up Instructions: I discussed the assessment and treatment plan with the patient. The patient was provided an opportunity to ask questions and all were answered. The patient agreed with the plan and demonstrated an understanding of the instructions.  A copy of instructions were sent to the patient via MyChart unless otherwise noted below.   The patient was advised to call back or seek an in-person evaluation if the symptoms worsen or if  the condition fails to improve as anticipated.  Time:  I spent 12 minutes with the patient via telehealth technology discussing the above problems/concerns.    Sierra Rio, PA-C

## 2021-01-23 NOTE — Patient Instructions (Signed)
Sierra Bentley, thank you for joining Sierra Rio, PA-C for today's virtual visit.  While this provider is not your primary care provider (PCP), if your PCP is located in our provider database this encounter information will be shared with them immediately following your visit.  Consent: (Patient) Sierra Bentley provided verbal consent for this virtual visit at the beginning of the encounter.  Current Medications:  Current Outpatient Medications:    latanoprost (XALATAN) 0.005 % ophthalmic solution, SMARTSIG:In Eye(s), Disp: , Rfl:    losartan-hydrochlorothiazide (HYZAAR) 50-12.5 MG tablet, TAKE 1/2 TABLET BY MOUTH EVERY EVENING, Disp: 45 tablet, Rfl: 0   Multiple Vitamin (MULTIVITAMIN) tablet, Take 1 tablet by mouth daily. Per pt has Postmenopausal multivitamin packet with 5 pills, Disp: , Rfl:    pantoprazole (PROTONIX) 40 MG tablet, Take 40 mg by mouth daily as needed. Per pt when needed takes in the evening, Disp: , Rfl: 11   Polyethyl Glycol-Propyl Glycol (SYSTANE ULTRA OP), Place 1 drop into both eyes 4 (four) times daily.  , Disp: , Rfl:    sodium chloride (MURO 128) 5 % ophthalmic ointment, Place 1 application into both eyes at bedtime. , Disp: , Rfl:    TURMERIC PO, Take 1,300 mg by mouth in the morning and at bedtime., Disp: , Rfl:    Medications ordered in this encounter:  No orders of the defined types were placed in this encounter.    *If you need refills on other medications prior to your next appointment, please contact your pharmacy*  Follow-Up: Call back or seek an in-person evaluation if the symptoms worsen or if the condition fails to improve as anticipated.  Other Instructions Please keep well-hydrated and get plenty of rest. Start a saline nasal rinse to flush out your nasal passages. You can use plain Mucinex to help thin congestion. If you have a humidifier, running in the bedroom at night. I want you to start OTC vitamin D3 1000 units daily, vitamin C  1000 mg daily, and a zinc supplement. Please take prescribed medications as directed.  You have been enrolled in a MyChart symptom monitoring program. Please answer these questions daily so we can keep track of how you are doing.  You were to quarantine for 5 days from onset of your symptoms.  After day 5, if you have had no fever and you are feeling better, you can end quarantine but need to mask for an additional 5 days. After day 5 if you have a fever or are having significant symptoms, please quarantine for full 10 days.  If you note any worsening of symptoms, any significant shortness of breath or any chest pain, please seek ER evaluation ASAP.  Please do not delay care!  COVID-19: What to Do if You Are Sick If you test positive and are an older adult or someone who is at high risk of getting very sick from COVID-19, treatment may be available. Contact a healthcare provider right away after a positive test to determine if you are eligible, even if your symptoms are mild right now. You can also visit a Test to Treat location and, if eligible, receive a prescription from a provider. Don't delay: Treatment must be started within the first few days to be effective. If you have a fever, cough, or other symptoms, you might have COVID-19. Most people have mild illness and are able to recover at home. If you are sick: Keep track of your symptoms. If you have an emergency warning sign (  including trouble breathing), call 911. Steps to help prevent the spread of COVID-19 if you are sick If you are sick with COVID-19 or think you might have COVID-19, follow the steps below to care for yourself and to help protect other people in your home and community. Stay home except to get medical care Stay home. Most people with COVID-19 have mild illness and can recover at home without medical care. Do not leave your home, except to get medical care. Do not visit public areas and do not go to places where you are  unable to wear a mask. Take care of yourself. Get rest and stay hydrated. Take over-the-counter medicines, such as acetaminophen, to help you feel better. Stay in touch with your doctor. Call before you get medical care. Be sure to get care if you have trouble breathing, or have any other emergency warning signs, or if you think it is an emergency. Avoid public transportation, ride-sharing, or taxis if possible. Get tested If you have symptoms of COVID-19, get tested. While waiting for test results, stay away from others, including staying apart from those living in your household. Get tested as soon as possible after your symptoms start. Treatments may be available for people with COVID-19 who are at risk for becoming very sick. Don't delay: Treatment must be started early to be effective--some treatments must begin within 5 days of your first symptoms. Contact your healthcare provider right away if your test result is positive to determine if you are eligible. Self-tests are one of several options for testing for the virus that causes COVID-19 and may be more convenient than laboratory-based tests and point-of-care tests. Ask your healthcare provider or your local health department if you need help interpreting your test results. You can visit your state, tribal, local, and territorial health department's website to look for the latest local information on testing sites. Separate yourself from other people As much as possible, stay in a specific room and away from other people and pets in your home. If possible, you should use a separate bathroom. If you need to be around other people or animals in or outside of the home, wear a well-fitting mask. Tell your close contacts that they may have been exposed to COVID-19. An infected person can spread COVID-19 starting 48 hours (or 2 days) before the person has any symptoms or tests positive. By letting your close contacts know they may have been exposed to  COVID-19, you are helping to protect everyone. See COVID-19 and Animals if you have questions about pets. If you are diagnosed with COVID-19, someone from the health department may call you. Answer the call to slow the spread. Monitor your symptoms Symptoms of COVID-19 include fever, cough, or other symptoms. Follow care instructions from your healthcare provider and local health department. Your local health authorities may give instructions on checking your symptoms and reporting information. When to seek emergency medical attention Look for emergency warning signs* for COVID-19. If someone is showing any of these signs, seek emergency medical care immediately: Trouble breathing Persistent pain or pressure in the chest New confusion Inability to wake or stay awake Pale, gray, or blue-colored skin, lips, or nail beds, depending on skin tone *This list is not all possible symptoms. Please call your medical provider for any other symptoms that are severe or concerning to you. Call 911 or call ahead to your local emergency facility: Notify the operator that you are seeking care for someone who has or may have  COVID-19. Call ahead before visiting your doctor Call ahead. Many medical visits for routine care are being postponed or done by phone or telemedicine. If you have a medical appointment that cannot be postponed, call your doctor's office, and tell them you have or may have COVID-19. This will help the office protect themselves and other patients. If you are sick, wear a well-fitting mask You should wear a mask if you must be around other people or animals, including pets (even at home). Wear a mask with the best fit, protection, and comfort for you. You don't need to wear the mask if you are alone. If you can't put on a mask (because of trouble breathing, for example), cover your coughs and sneezes in some other way. Try to stay at least 6 feet away from other people. This will help protect  the people around you. Masks should not be placed on young children under age 40 years, anyone who has trouble breathing, or anyone who is not able to remove the mask without help. Cover your coughs and sneezes Cover your mouth and nose with a tissue when you cough or sneeze. Throw away used tissues in a lined trash can. Immediately wash your hands with soap and water for at least 20 seconds. If soap and water are not available, clean your hands with an alcohol-based hand sanitizer that contains at least 60% alcohol. Clean your hands often Wash your hands often with soap and water for at least 20 seconds. This is especially important after blowing your nose, coughing, or sneezing; going to the bathroom; and before eating or preparing food. Use hand sanitizer if soap and water are not available. Use an alcohol-based hand sanitizer with at least 60% alcohol, covering all surfaces of your hands and rubbing them together until they feel dry. Soap and water are the best option, especially if hands are visibly dirty. Avoid touching your eyes, nose, and mouth with unwashed hands. Handwashing Tips Avoid sharing personal household items Do not share dishes, drinking glasses, cups, eating utensils, towels, or bedding with other people in your home. Wash these items thoroughly after using them with soap and water or put in the dishwasher. Clean surfaces in your home regularly Clean and disinfect high-touch surfaces (for example, doorknobs, tables, handles, light switches, and countertops) in your "sick room" and bathroom. In shared spaces, you should clean and disinfect surfaces and items after each use by the person who is ill. If you are sick and cannot clean, a caregiver or other person should only clean and disinfect the area around you (such as your bedroom and bathroom) on an as needed basis. Your caregiver/other person should wait as long as possible (at least several hours) and wear a mask before  entering, cleaning, and disinfecting shared spaces that you use. Clean and disinfect areas that may have blood, stool, or body fluids on them. Use household cleaners and disinfectants. Clean visible dirty surfaces with household cleaners containing soap or detergent. Then, use a household disinfectant. Use a product from [employer] List N: Disinfectants for Coronavirus (SNKNL-97). Be sure to follow the instructions on the label to ensure safe and effective use of the product. Many products recommend keeping the surface wet with a disinfectant for a certain period of time (look at "contact time" on the product label). You may also need to wear personal protective equipment, such as gloves, depending on the directions on the product label. Immediately after disinfecting, wash your hands with soap and water for  20 seconds. For completed guidance on cleaning and disinfecting your home, visit Complete Disinfection Guidance. Take steps to improve ventilation at home Improve ventilation (air flow) at home to help prevent from spreading COVID-19 to other people in your household. Clear out COVID-19 virus particles in the air by opening windows, using air filters, and turning on fans in your home. Use this interactive tool to learn how to improve air flow in your home. When you can be around others after being sick with COVID-19 Deciding when you can be around others is different for different situations. Find out when you can safely end home isolation. For any additional questions about your care, contact your healthcare provider or state or local health department. 04/15/2020 Content source: [facility] for Immunization and Respiratory Diseases ([facility]), Division of Viral Diseases This information is not intended to replace advice given to you by your health care provider. Make sure you discuss any questions you have with your health care provider. Document Revised: 05/29/2020 Document Reviewed:  05/29/2020 Elsevier Patient Education  2022 [employer].      If you have been instructed to have an in-person evaluation today at a local Urgent Care facility, please use the link below. It will take you to a list of all of our available [facility], including address, phone number and hours of operation. Please do not delay care.  [facility]  If you or a family member do not have a primary care provider, use the link below to schedule a visit and establish care. When you choose a [facility] primary care physician or advanced practice provider, you gain a long-term partner in health. Find a Primary Care Provider  Learn more about [facility]'s in-office and virtual care options: [facility]

## 2021-01-27 ENCOUNTER — Other Ambulatory Visit: Payer: Self-pay

## 2021-01-27 ENCOUNTER — Encounter: Payer: Self-pay | Admitting: Family Medicine

## 2021-01-27 ENCOUNTER — Telehealth (INDEPENDENT_AMBULATORY_CARE_PROVIDER_SITE_OTHER): Payer: Medicare PPO | Admitting: Family Medicine

## 2021-01-27 DIAGNOSIS — U071 COVID-19: Secondary | ICD-10-CM | POA: Diagnosis not present

## 2021-01-27 MED ORDER — PROMETHAZINE-DM 6.25-15 MG/5ML PO SYRP: 5.0000 mL | ORAL_SOLUTION | Freq: Four times a day (QID) | ORAL | 0 refills | Status: DC | PRN

## 2021-01-27 NOTE — Patient Instructions (Signed)
Continue to drink fluids and rest  If your cough becomes productive you can try an expectorant like mucinex Finish your paxlovid   For cough control I sent in promethazine DM to try  It can sedate so be careful  You can continue tessalon for cough also   Update if not starting to improve in a week or if worsening  If any severe symptoms or shortness of breath please alert [place] and go to the pharmacy

## 2021-01-27 NOTE — Progress Notes (Signed)
Virtual Visit via Video Note  I connected with Sierra Bentley on 01/27/21 at  2:00 PM EST by a video enabled telemedicine application and verified that I am speaking with the correct person using two identifiers.  Location: Patient: home Provider: office    I discussed the limitations of evaluation and management by telemedicine and the availability of in person appointments. The patient expressed understanding and agreed to proceed.  Parties involved in encounter  Patient: Sierra Bentley   Provider:  Loura Pardon MD   Video failed today so visit was done by phone   History of Present Illness: Pt presents with c/o cough with positive covid test   She had a video visit with Raiford Noble PA on 12/30  Noted Headache Fatigue Sore throat  Cough  Tessalon and paxlovid were sent to the pharmacy  Recommended 1000 iu D3 daily and vit C 1000 mg daily and zinc  She is tolerating it ok/ ? Not helping her that much  Tomorrow is her last day  Tessalon helps a bit   Cough is bad/won't stop (has coughing fits) -sleeping in a chair  Non productive for the most  No wheezing  Is hoarse   No otc cough medicines   Does not feel like she has a fever  Is a little sleepy No dizziness   Has to do husband's dialysis also at home   Did use some chloraseptic for sore throat and it helped    Patient Active Problem List   Diagnosis Date Noted   Lump of left thigh 05/07/2019   Poor balance 05/07/2019   Vestibular dizziness 03/31/2018   History of hip fracture 02/27/2018   Pedal edema 09/28/2017   Fall at home 09/28/2017   Prediabetes 11/30/2016   Toenail fungus 10/03/2015   At moderate risk for fall 09/05/2015   Cervical spondylosis without myelopathy 08/05/2015   Routine general medical examination at a health care facility 08/27/2014   Colon cancer screening 08/27/2014   Fall against object 08/15/2014   Encounter for Medicare annual wellness exam 07/25/2013   Varicosities of leg  05/23/2012   Abnormal ultrasound of thyroid gland 03/01/2011   Hyperlipidemia 06/11/2008   Allergic rhinitis 03/29/2008   Essential hypertension, benign 12/26/2006   GERD 12/26/2006   Rheumatoid arthritis ([place]) 12/26/2006   Osteoporosis 12/26/2006   URINARY INCONTINENCE 12/26/2006   Past Medical History:  Diagnosis Date   Allergic rhinitis, cause unspecified    Anemia    Chronically dry eyes, bilateral    Diverticulosis of colon    Endometrial polyp    Fuchs' corneal dystrophy of right eye    GERD (gastroesophageal reflux disease)    Hearing loss of both ears    pt stated has hearing aids but does not wear   History of epistaxis    per pt has had several cautization's for nose bleed   HTN (hypertension)    Hyperlipidemia    Macular degeneration of both eyes    followed @[facility]  by dr Mamie Nick. mettu;   both age-related and right is with active choroidal neovascularization   OA (osteoarthritis)    OAB (overactive bladder)    Osteopenia    Thickened endometrium    Urge incontinence of urine    Wears glasses    Past Surgical History:  Procedure Laterality Date   BREAST BIOPSY Left 2011   fibrocystic change   CATARACT EXTRACTION W/ INTRAOCULAR LENS IMPLANT Bilateral 2008   COLONOSCOPY  2013   DILATATION & CURETTAGE/HYSTEROSCOPY  WITH MYOSURE N/A 11/26/2020   Procedure: DILATATION & CURETTAGE/HYSTEROSCOPY WITH MYOSURE;  Surgeon: Princess Bruins, MD;  Location: [place];  Service: Gynecology;  Laterality: N/A;   TONSILLECTOMY  1962   Social History   Tobacco Use   Smoking status: Never   Smokeless tobacco: Never  Vaping Use   Vaping Use: Never used  Substance Use Topics   Alcohol use: No    Alcohol/week: 0.0 standard drinks   Drug use: Never   Family History  Problem Relation Age of Onset   Heart attack Father 5   Coronary artery disease Father    Other Mother        Arrythmia   Coronary artery disease Mother    Hypertension Mother    Anxiety  disorder Mother    Breast cancer Other        2nd cousin   Depression Other        family history   Allergies  Allergen Reactions   Ace Inhibitors Cough   Boniva [Ibandronic Acid]     Esophageal distress   Chocolate Other (See Comments)    Severe headache and stopped up sinuses   Fosamax [Alendronate]     Esophageal distress   Oxycodone Other (See Comments)    Dizziness resulting in fall    Current Outpatient Medications on File Prior to Visit  Medication Sig Dispense Refill   benzonatate (TESSALON) 100 MG capsule Take 1 capsule (100 mg total) by mouth 3 (three) times daily as needed for cough. 30 capsule 0   latanoprost (XALATAN) 0.005 % ophthalmic solution SMARTSIG:In Eye(s)     losartan-hydrochlorothiazide (HYZAAR) 50-12.5 MG tablet TAKE 1/2 TABLET BY MOUTH EVERY EVENING 45 tablet 0   Multiple Vitamin (MULTIVITAMIN) tablet Take 1 tablet by mouth daily. Per pt has Postmenopausal multivitamin packet with 5 pills     nirmatrelvir/ritonavir EUA (PAXLOVID) 20 x 150 MG & 10 x 100MG  TABS Take 3 tablets by mouth 2 (two) times daily for 5 days. (Take nirmatrelvir 150 mg two tablets twice daily for 5 days and ritonavir 100 mg one tablet twice daily for 5 days) Patient GFR is >60 30 tablet 0   pantoprazole (PROTONIX) 40 MG tablet Take 40 mg by mouth daily as needed. Per pt when needed takes in the evening  11   Polyethyl Glycol-Propyl Glycol (SYSTANE ULTRA OP) Place 1 drop into both eyes 4 (four) times daily.       sodium chloride (MURO 128) 5 % ophthalmic ointment Place 1 application into both eyes at bedtime.      TURMERIC PO Take 1,300 mg by mouth in the morning and at bedtime.     No current facility-administered medications on file prior to visit.   Review of Systems  Constitutional:  Positive for malaise/fatigue. Negative for chills and fever.  HENT:  Positive for congestion and sore throat. Negative for ear pain and sinus pain.   Eyes:  Negative for blurred vision, discharge and  redness.  Respiratory:  Positive for cough. Negative for sputum production, shortness of breath, wheezing and stridor.   Cardiovascular:  Negative for chest pain, palpitations and leg swelling.  Gastrointestinal:  Negative for abdominal pain, diarrhea, nausea and vomiting.  Musculoskeletal:  Negative for myalgias.  Skin:  Negative for rash.  Neurological:  Positive for headaches. Negative for dizziness.       Headache is not as bad as prior   Observations/Objective: Pt sounds well/in no distress Hoarse and congested sounding voice  Voices  being tired Occ dry sounding cough noted No wheezing or sob with speech  Nl cognition/good historian  Nl mood /pleasant  Assessment and Plan: Problem List Items Addressed This Visit       Other   COVID-19    Seen prior on 12/20 and px paxlovid and tessalon Has one day left of paxlovid- will finish it  Cough is persistent (other symptoms improved) No sob or wheezing -will watch for this  Adv to continue tessalon prn Will add prometh DM with caution of sedation  Update if not starting to improve in a week or if worsening  ER precautions reviewed  Symptom care with fluids/rest  Can try an expectorant if needed/right now her cough is dry and not necessary         Follow Up Instructions: Continue to drink fluids and rest  If your cough becomes productive you can try an expectorant like mucinex Finish your paxlovid   For cough control I sent in promethazine DM to try  It can sedate so be careful  You can continue tessalon for cough also   Update if not starting to improve in a week or if worsening  If any severe symptoms or shortness of breath please alert [place] and go to the pharmacy   I discussed the assessment and treatment plan with the patient. The patient was provided an opportunity to ask questions and all were answered. The patient agreed with the plan and demonstrated an understanding of the instructions.   The patient was advised  to call back or seek an in-person evaluation if the symptoms worsen or if the condition fails to improve as anticipated.  I provided 16 minutes of non-face-to-face time during this encounter.   Loura Pardon, MD

## 2021-01-27 NOTE — Assessment & Plan Note (Signed)
Seen prior on 12/20 and px paxlovid and tessalon Has one day left of paxlovid- will finish it  Cough is persistent (other symptoms improved) No sob or wheezing -will watch for this  Adv to continue tessalon prn Will add prometh DM with caution of sedation  Update if not starting to improve in a week or if worsening  ER precautions reviewed  Symptom care with fluids/rest  Can try an expectorant if needed/right now her cough is dry and not necessary

## 2021-01-30 ENCOUNTER — Telehealth: Payer: Self-pay | Admitting: Family Medicine

## 2021-01-30 DIAGNOSIS — U071 COVID-19: Secondary | ICD-10-CM

## 2021-01-30 MED ORDER — BENZONATATE 100 MG PO CAPS: 100.0000 mg | ORAL_CAPSULE | Freq: Three times a day (TID) | ORAL | 1 refills | Status: DC | PRN

## 2021-01-30 NOTE — Telephone Encounter (Signed)
I sent it to [facility] Unsure about delivery process/she may have to call them

## 2021-01-30 NOTE — Telephone Encounter (Signed)
Pt notified Rx sent to pharmacy

## 2021-01-30 NOTE — Telephone Encounter (Signed)
Pt called stating that she had a virtual visit with Dr Glori Bickers on 01/27/21 and she prescribed some cough syrup. 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 Pt states that she also had a virtual visit with Raiford Noble and he prescribed her medication benzonatate (TESSALON) 100 MG capsule, pt states that she have misplaced them and would like for Dr Glori Bickers to prescribe that medication to her.Pt states that she would like them delivered and delivered as soon as possible because she has nothing to take. Please advise.

## 2021-02-06 DIAGNOSIS — H353231 Exudative age-related macular degeneration, bilateral, with active choroidal neovascularization: Secondary | ICD-10-CM | POA: Diagnosis not present

## 2021-02-25 DIAGNOSIS — H353231 Exudative age-related macular degeneration, bilateral, with active choroidal neovascularization: Secondary | ICD-10-CM | POA: Diagnosis not present

## 2021-02-25 DIAGNOSIS — H353123 Nonexudative age-related macular degeneration, left eye, advanced atrophic without subfoveal involvement: Secondary | ICD-10-CM | POA: Diagnosis not present

## 2021-02-25 DIAGNOSIS — H18513 Endothelial corneal dystrophy, bilateral: Secondary | ICD-10-CM | POA: Diagnosis not present

## 2021-02-25 DIAGNOSIS — N6325 Unspecified lump in the left breast, overlapping quadrants: Secondary | ICD-10-CM | POA: Diagnosis not present

## 2021-02-26 ENCOUNTER — Encounter: Payer: Self-pay | Admitting: Obstetrics & Gynecology

## 2021-03-02 ENCOUNTER — Other Ambulatory Visit: Payer: Self-pay

## 2021-03-10 ENCOUNTER — Other Ambulatory Visit: Payer: Self-pay | Admitting: Radiology

## 2021-03-10 DIAGNOSIS — N6325 Unspecified lump in the left breast, overlapping quadrants: Secondary | ICD-10-CM | POA: Diagnosis not present

## 2021-03-10 DIAGNOSIS — N6012 Diffuse cystic mastopathy of left breast: Secondary | ICD-10-CM | POA: Diagnosis not present

## 2021-03-10 DIAGNOSIS — C50812 Malignant neoplasm of overlapping sites of left female breast: Secondary | ICD-10-CM | POA: Diagnosis not present

## 2021-03-10 LAB — HM MAMMOGRAPHY: HM Mammogram: ABNORMAL — AB (ref 0–4)

## 2021-03-11 ENCOUNTER — Encounter: Payer: Self-pay | Admitting: Family Medicine

## 2021-03-12 ENCOUNTER — Telehealth: Payer: Self-pay | Admitting: *Deleted

## 2021-03-12 ENCOUNTER — Encounter: Payer: Self-pay | Admitting: *Deleted

## 2021-03-12 ENCOUNTER — Encounter: Payer: Self-pay | Admitting: Obstetrics & Gynecology

## 2021-03-12 NOTE — Telephone Encounter (Signed)
Left message for a return phone call to schedule [facility] appt for 2/22

## 2021-03-13 ENCOUNTER — Telehealth: Payer: Self-pay | Admitting: *Deleted

## 2021-03-13 ENCOUNTER — Encounter: Payer: Self-pay | Admitting: *Deleted

## 2021-03-13 NOTE — Telephone Encounter (Signed)
Spoke with patient to confirm [facility] appt for 1215 on 2/22. Had a long discussion with her regarding clinic and the purpose.  Patient states "I just want to take the chemo pill".  Explained to her that is something she may not need. She tells me at 75 she doesn't think she wants to do chemo or radiation.  Offered her an appointment at another time with just the medical oncologist 1st to discuss, but patient decided to just keep appt for [facility] clinic for 2/22 at 1215. Contact information given and informed that [facility] had mailed her packet to fill out and bring with her to the appt. Patient verbalized understanding.

## 2021-03-17 ENCOUNTER — Encounter: Payer: Self-pay | Admitting: *Deleted

## 2021-03-17 ENCOUNTER — Other Ambulatory Visit: Payer: Self-pay | Admitting: *Deleted

## 2021-03-17 DIAGNOSIS — C50412 Malignant neoplasm of upper-outer quadrant of left female breast: Secondary | ICD-10-CM

## 2021-03-17 DIAGNOSIS — Z171 Estrogen receptor negative status [ER-]: Secondary | ICD-10-CM

## 2021-03-17 NOTE — Progress Notes (Signed)
[facility] NOTE  Patient Care Team: Tower, [name], MD as PCP - General Beverly Gust, MD as Referring Physician (Otolaryngology) Shirlee More, MD as Referring Physician (Ophthalmology) Rockwell Germany, RN as Oncology Nurse Navigator Mauro Kaufmann, RN as Oncology Nurse Navigator Rolm Bookbinder, MD as Consulting Physician (General Surgery) Nicholas Lose, MD as Consulting Physician (Hematology and Oncology) Gery Pray, MD as Consulting Physician (Radiation Oncology)  CHIEF COMPLAINTS/PURPOSE OF CONSULTATION:  Newly diagnosed left breast cancer  HISTORY OF PRESENTING ILLNESS:  [name] 88 y.o. female is here because of recent diagnosis of invasive ductal carcinoma of the left breast. Diagnostic mammogram and [place] on 02/25/2021 showed 1.6 x 2.5 cm irregular high density mass in the left breast. Biopsy on 03/10/2021 showed grade 2 invasive ductal carcinoma, Her2/ER/PR-. She presents to the clinic today for initial evaluation and discussion of treatment options.   I reviewed her records extensively and collaborated the history with the patient.  SUMMARY OF ONCOLOGIC HISTORY: Oncology History  Malignant neoplasm of upper-outer quadrant of left breast in female, estrogen receptor negative ([place])  03/10/2021 Initial Diagnosis   Screening mammogram detected left breast mass at 12 o'clock position: 1.6 cm, by ultrasound retroareolar mass 3.5 x 1.4 x 2.2 cm, additional mass 0.8 cm: Benign on biopsy, axilla negative   03/18/2021 Cancer Staging   Staging form: Breast, AJCC 8th Edition - Clinical stage from 03/18/2021: Stage IIB (cT2, cN0, cM0, G2, ER-, PR-, HER2-) - Signed by Nicholas Lose, MD on 03/18/2021 Stage prefix: Initial diagnosis Histologic grading system: 3 grade system      MEDICAL HISTORY:  Past Medical History:  Diagnosis Date   Allergic rhinitis, cause unspecified    Anemia    Breast cancer ([place])    Chronically dry eyes,  bilateral    Diverticulosis of colon    Endometrial polyp    Fuchs' corneal dystrophy of right eye    GERD (gastroesophageal reflux disease)    Hearing loss of both ears    pt stated has hearing aids but does not wear   History of epistaxis    per pt has had several cautization's for nose bleed   HTN (hypertension)    Hyperlipidemia    Macular degeneration of both eyes    followed @[facility]  by dr Mamie Nick. mettu;   both age-related and right is with active choroidal neovascularization   OA (osteoarthritis)    OAB (overactive bladder)    Osteopenia    Thickened endometrium    Urge incontinence of urine    Wears glasses     SURGICAL HISTORY: Past Surgical History:  Procedure Laterality Date   BREAST BIOPSY Left 2011   fibrocystic change   CATARACT EXTRACTION W/ INTRAOCULAR LENS IMPLANT Bilateral 2008   COLONOSCOPY  2013   DILATATION & CURETTAGE/HYSTEROSCOPY WITH MYOSURE N/A 11/26/2020   Procedure: DILATATION & CURETTAGE/HYSTEROSCOPY WITH MYOSURE;  Surgeon: Princess Bruins, MD;  Location: [place];  Service: Gynecology;  Laterality: N/A;   TONSILLECTOMY  1962    SOCIAL HISTORY: Social History   Socioeconomic History   Marital status: Married    Spouse name: Not on file   Number of children: Not on file   Years of education: Not on file   Highest education level: Not on file  Occupational History   Not on file  Tobacco Use   Smoking status: Never   Smokeless tobacco: Never  Vaping Use   Vaping Use: Never used  Substance and Sexual  Activity   Alcohol use: No    Alcohol/week: 0.0 standard drinks   Drug use: Never   Sexual activity: Not Currently    Partners: Male    Birth control/protection: Post-menopausal  Other Topics Concern   Not on file  Social History Narrative   Married      No biological children (Has step children)      Taught HS x 30 years      Regular exercise         Social Determinants of Health   Financial Resource Strain: Low  Risk    Difficulty of Paying Living Expenses: Not hard at all  Food Insecurity: No Food Insecurity   Worried About Charity fundraiser in the Last Year: Never true   [place] in the Last Year: Never true  Transportation Needs: No Transportation Needs   Lack of Transportation (Medical): No   Lack of Transportation (Non-Medical): No  Physical Activity: Inactive   Days of Exercise per Week: 0 days   Minutes of Exercise per Session: 0 min  Stress: No Stress Concern Present   Feeling of Stress : Not at all  Social Connections: Moderately Isolated   Frequency of Communication with Friends and Family: Twice a week   Frequency of Social Gatherings with Friends and Family: Twice a week   Attends Religious Services: Never   Printmaker: No   Attends Music therapist: Never   Marital Status: Married  Human resources officer Violence: Not At Risk   Fear of Current or Ex-Partner: No   Emotionally Abused: No   Physically Abused: No   Sexually Abused: No    FAMILY HISTORY: Family History  Problem Relation Age of Onset   Other Mother        Arrythmia   Coronary artery disease Mother    Hypertension Mother    Anxiety disorder Mother    Heart attack Father 69   Coronary artery disease Father    Breast cancer Other        2nd cousin   Depression Other        family history    ALLERGIES:  is allergic to ace inhibitors, boniva [ibandronic acid], chocolate, fosamax [alendronate], and oxycodone.  MEDICATIONS:  Current Outpatient Medications  Medication Sig Dispense Refill   benzonatate (TESSALON) 100 MG capsule Take 1 capsule (100 mg total) by mouth 3 (three) times daily as needed for cough. 30 capsule 1   latanoprost (XALATAN) 0.005 % ophthalmic solution SMARTSIG:In Eye(s)     losartan-hydrochlorothiazide (HYZAAR) 50-12.5 MG tablet TAKE 1/2 TABLET BY MOUTH EVERY EVENING 45 tablet 0   Multiple Vitamin (MULTIVITAMIN) tablet Take 1 tablet by mouth  daily. Per pt has Postmenopausal multivitamin packet with 5 pills     pantoprazole (PROTONIX) 40 MG tablet Take 40 mg by mouth daily as needed. Per pt when needed takes in the evening  11   Polyethyl Glycol-Propyl Glycol (SYSTANE ULTRA OP) Place 1 drop into both eyes 4 (four) times daily.       promethazine-dextromethorphan (PROMETHAZINE-DM) 6.25-15 MG/5ML syrup Take 5 mLs by mouth 4 (four) times daily as needed for cough. Caution of sedation 118 mL 0   sodium chloride (MURO 128) 5 % ophthalmic ointment Place 1 application into both eyes at bedtime.      TURMERIC PO Take 1,300 mg by mouth in the morning and at bedtime.     No current facility-administered medications for this visit.  REVIEW OF SYSTEMS:   Constitutional: Denies fevers, chills or abnormal night sweats Eyes: Denies blurriness of vision, double vision or watery eyes Ears, nose, mouth, throat, and face: Denies mucositis or sore throat Respiratory: Denies cough, dyspnea or wheezes Cardiovascular: Denies palpitation, chest discomfort or lower extremity swelling Gastrointestinal:  Denies nausea, heartburn or change in bowel habits Skin: Denies abnormal skin rashes Lymphatics: Denies new lymphadenopathy or easy bruising Neurological:Denies numbness, tingling or new weaknesses Behavioral/Psych: Mood is stable, no new changes  Breast:  Denies any palpable lumps or discharge All other systems were reviewed with the patient and are negative.  PHYSICAL EXAMINATION: ECOG PERFORMANCE STATUS: 1 - Symptomatic but completely ambulatory  Vitals:   03/18/21 1246  BP: 127/88  Pulse: 83  Resp: 18  Temp: (!) 97.2 F (36.2 C)  SpO2: 100%   Filed Weights   03/18/21 1246  Weight: 108 lb 8 oz (49.2 kg)       LABORATORY DATA:  I have reviewed the data as listed Lab Results  Component Value Date   WBC 5.3 03/18/2021   HGB 12.1 03/18/2021   HCT 37.7 03/18/2021   MCV 87.1 03/18/2021   PLT 244 03/18/2021   Lab Results   Component Value Date   NA 140 03/18/2021   K 3.7 03/18/2021   CL 103 03/18/2021   CO2 34 (H) 03/18/2021    RADIOGRAPHIC STUDIES: I have personally reviewed the radiological reports and agreed with the findings in the report.  ASSESSMENT AND PLAN:  Malignant neoplasm of upper-outer quadrant of left breast in female, estrogen receptor negative ([place]) 03/10/2021:Screening mammogram detected left breast mass at 12 o'clock position: 1.6 cm, by ultrasound retroareolar mass 3.5 x 1.4 x 2.2 cm, additional mass 0.8 cm: Benign on biopsy, axilla negative  Pathology and radiology counseling: Discussed with the patient, the details of pathology including the type of breast cancer,the clinical staging, the significance of ER, PR and HER-2/neu receptors and the implications for treatment. After reviewing the pathology in detail, we proceeded to discuss the different treatment options between surgery, radiation.  1.  Breast conserving surgery 2. adjuvant radiation  Being triple negative disease there is no role of antiestrogen therapy. Given her age she is not a good candidate for systemic chemotherapy. Return to clinic after surgery to discuss final pathology report  All questions were answered. The patient knows to call the clinic with any problems, questions or concerns.   Rulon Eisenmenger, MD, MPH 03/18/2021    I, Thana Ates, am acting as scribe for Nicholas Lose, MD.  I have reviewed the above documentation for accuracy and completeness, and I agree with the above.

## 2021-03-17 NOTE — Progress Notes (Signed)
Radiation Oncology         [phone] ________________________________  Multidisciplinary Breast Oncology Clinic [facility]) Initial Outpatient Consultation  Name: Sierra Bentley MRN: [id number]  Date: 03/18/2021  DOB: 1934/01/22  CC:Tower, [name], MD  Rolm Bookbinder, MD   REFERRING PHYSICIAN: Rolm Bookbinder, MD  DIAGNOSIS: The encounter diagnosis was Malignant neoplasm of upper-outer quadrant of left breast in female, estrogen receptor negative ([place]).  Stage IIB (cT2, cN0, cM0) Left Breast UOQ, Invasive Ductal Carcinoma, ER- / PR- / Her2-, Grade 2    ICD-10-CM   1. Malignant neoplasm of upper-outer quadrant of left breast in female, estrogen receptor negative ([place])  C50.412    Z17.1       HISTORY OF PRESENT ILLNESS::Sierra Bentley is a 88 y.o. female who is presenting to the office today for evaluation of her newly diagnosed breast cancer. She is accompanied by no one. She is doing well overall.   She had routine screening mammography on 01/12/21 showing a possible abnormality in the left breast. She underwent unilateral left diagnostic mammography at [facility] on 02/25/21 showing a 1.6 x 2.5 cm irregular high density mass in the left breast, 12 o'clock position, highly suggestive of malignancy. Left breast ultrasonography on this same date again revealed the irregular 12 o'clock left breast mass highly suggestive of malignancy, measuring 3.5 x 1.4 x 2.2 cm, located at an anterior depth. The margins of this mass were notably difficult to discern and possibly suggestive of several adjacent debris filled ducts, raising the concern that the extent of disease is greater than seen on [place]. In addition to the above mass, a 0.8 x 0.5 x 0.7 cm irregular mass was also seen in the left breast 3 o'clock position-middle depth, also concerning for malignancy.    Biopsy of the 12:00 left breast mass on 03/10/21 showed: grade 2 invasive ductal carcinoma measuring 0.8 cm in the greatest tumor  dimension. Biopsy of the 3 o'clock left breast mass showed no evidence of malignancy, with fibrocystic changes and calcifications. Prognostic indicators significant for: estrogen receptor negative and progesterone receptor negative. Proliferation marker Ki67 at 5%. HER2 negative.  Menarche: 88 years old LMP: no longer having periods, date of last period unknown Contraceptive: on the provided for she lists that she does not remember is she has used a form of birth control HRT: Yes, for several years though not sure when    The patient was referred today for presentation in the multidisciplinary conference.  Radiology studies and pathology slides were presented there for review and discussion of treatment options.  A consensus was discussed regarding potential next steps.  PREVIOUS RADIATION THERAPY: No  PAST MEDICAL HISTORY:  Past Medical History:  Diagnosis Date   Allergic rhinitis, cause unspecified    Anemia    Breast cancer (HCC)    Chronically dry eyes, bilateral    Diverticulosis of colon    Endometrial polyp    Family history of prostate cancer 03/18/2021   Fuchs' corneal dystrophy of right eye    GERD (gastroesophageal reflux disease)    Hearing loss of both ears    pt stated has hearing aids but does not wear   History of epistaxis    per pt has had several cautization's for nose bleed   HTN (hypertension)    Hyperlipidemia    Macular degeneration of both eyes    followed @[facility]  by dr Mamie Nick. mettu;   both age-related and right is with active choroidal neovascularization   OA (  osteoarthritis)    OAB (overactive bladder)    Osteopenia    Thickened endometrium    Urge incontinence of urine    Wears glasses     PAST SURGICAL HISTORY: Past Surgical History:  Procedure Laterality Date   BREAST BIOPSY Left 2011   fibrocystic change   CATARACT EXTRACTION W/ INTRAOCULAR LENS IMPLANT Bilateral 2008   COLONOSCOPY  2013   DILATATION & CURETTAGE/HYSTEROSCOPY WITH MYOSURE N/A  11/26/2020   Procedure: DILATATION & CURETTAGE/HYSTEROSCOPY WITH MYOSURE;  Surgeon: Princess Bruins, MD;  Location: [place];  Service: Gynecology;  Laterality: N/A;   TONSILLECTOMY  1962    FAMILY HISTORY:  Family History  Problem Relation Age of Onset   Other Mother        Arrythmia   Coronary artery disease Mother    Hypertension Mother    Anxiety disorder Mother    Heart attack Father 40   Coronary artery disease Father    Prostate cancer Father        dx unknown age   Throat cancer Brother        d. 38   Cancer Other        two maternal female cousins; unknown cancer; dx after 26   Depression Other        family history    SOCIAL HISTORY:  Social History   Socioeconomic History   Marital status: Married    Spouse name: Not on file   Number of children: Not on file   Years of education: Not on file   Highest education level: Not on file  Occupational History   Not on file  Tobacco Use   Smoking status: Never   Smokeless tobacco: Never  Vaping Use   Vaping Use: Never used  Substance and Sexual Activity   Alcohol use: No    Alcohol/week: 0.0 standard drinks   Drug use: Never   Sexual activity: Not Currently    Partners: Male    Birth control/protection: Post-menopausal  Other Topics Concern   Not on file  Social History Narrative   Married      No biological children (Has step children)      Taught HS x 30 years      Regular exercise         Social Determinants of Health   Financial Resource Strain: Low Risk    Difficulty of Paying Living Expenses: Not hard at all  Food Insecurity: No Food Insecurity   Worried About Charity fundraiser in the Last Year: Never true   Ran Out of Food in the Last Year: Never true  Transportation Needs: No Transportation Needs   Lack of Transportation (Medical): No   Lack of Transportation (Non-Medical): No  Physical Activity: Inactive   Days of Exercise per Week: 0 days   Minutes of Exercise  per Session: 0 min  Stress: No Stress Concern Present   Feeling of Stress : Not at all  Social Connections: Moderately Isolated   Frequency of Communication with Friends and Family: Twice a week   Frequency of Social Gatherings with Friends and Family: Twice a week   Attends Religious Services: Never   Marine scientist or Organizations: No   Attends Music therapist: Never   Marital Status: Married    ALLERGIES:  Allergies  Allergen Reactions   Ace Inhibitors Cough   Boniva [Ibandronic Acid]     Esophageal distress   Chocolate Other (See Comments)  Severe headache and stopped up sinuses   Fosamax [Alendronate]     Esophageal distress   Oxycodone Other (See Comments)    Dizziness resulting in fall     MEDICATIONS:  Current Outpatient Medications  Medication Sig Dispense Refill   benzonatate (TESSALON) 100 MG capsule Take 1 capsule (100 mg total) by mouth 3 (three) times daily as needed for cough. 30 capsule 1   latanoprost (XALATAN) 0.005 % ophthalmic solution SMARTSIG:In Eye(s)     losartan-hydrochlorothiazide (HYZAAR) 50-12.5 MG tablet TAKE 1/2 TABLET BY MOUTH EVERY EVENING 45 tablet 0   Multiple Vitamin (MULTIVITAMIN) tablet Take 1 tablet by mouth daily. Per pt has Postmenopausal multivitamin packet with 5 pills     pantoprazole (PROTONIX) 40 MG tablet Take 40 mg by mouth daily as needed. Per pt when needed takes in the evening  11   Polyethyl Glycol-Propyl Glycol (SYSTANE ULTRA OP) Place 1 drop into both eyes 4 (four) times daily.       promethazine-dextromethorphan (PROMETHAZINE-DM) 6.25-15 MG/5ML syrup Take 5 mLs by mouth 4 (four) times daily as needed for cough. Caution of sedation 118 mL 0   sodium chloride (MURO 128) 5 % ophthalmic ointment Place 1 application into both eyes at bedtime.      TURMERIC PO Take 1,300 mg by mouth in the morning and at bedtime.     No current facility-administered medications for this encounter.    REVIEW OF SYSTEMS:  A 10+ POINT REVIEW OF SYSTEMS WAS OBTAINED including neurology, dermatology, psychiatry, cardiac, respiratory, lymph, extremities, GI, GU, musculoskeletal, constitutional, reproductive, HEENT. On the provided form, she reports weight change, mild fatigue, blurred vision (indicated that she has had eye injections with 'Elya'), hearing loss, runny nose, sinus problems, nose bleeding, sore throat, hoarse voice, poor circulation, dry cough, incontinence, arthritis, using a cane with ambulation, and headaches. She denies any other symptoms.    PHYSICAL EXAM:    Vitals with BMI 03/18/2021  Height 5' 3"   Weight 108 lbs 8 oz  BMI 07.86  Systolic 754  Diastolic 88  Pulse 83   Lungs are clear to auscultation bilaterally. Heart has regular rate and rhythm. No palpable cervical, supraclavicular, or axillary adenopathy. Abdomen soft, non-tender, normal bowel sounds. Breast: Right breast with no palpable mass, nipple discharge, or bleeding. Left breast with large palpable mass in the upper aspect of the breast with no nipple discharge, bleeding, or skin involvement.   KPS = 90  100 - Normal; no complaints; no evidence of disease. 90   - Able to carry on normal activity; minor signs or symptoms of disease. 80   - Normal activity with effort; some signs or symptoms of disease. 22   - Cares for self; unable to carry on normal activity or to do active work. 60   - Requires occasional assistance, but is able to care for most of his personal needs. 50   - Requires considerable assistance and frequent medical care. 50   - Disabled; requires special care and assistance. 49   - Severely disabled; hospital admission is indicated although death not imminent. 87   - Very sick; hospital admission necessary; active supportive treatment necessary. 10   - Moribund; fatal processes progressing rapidly. 0     - Dead  Karnofsky DA, Abelmann WH, Craver LS and Burchenal [facility] [phone]) The use of the nitrogen mustards in the  palliative treatment of carcinoma: with particular reference to bronchogenic carcinoma Cancer 1 634-56  LABORATORY DATA:  Lab Results  Component  Value Date   WBC 5.3 03/18/2021   HGB 12.1 03/18/2021   HCT 37.7 03/18/2021   MCV 87.1 03/18/2021   PLT 244 03/18/2021   Lab Results  Component Value Date   NA 140 03/18/2021   K 3.7 03/18/2021   CL 103 03/18/2021   CO2 34 (H) 03/18/2021   Lab Results  Component Value Date   ALT 20 03/18/2021   AST 20 03/18/2021   ALKPHOS 43 03/18/2021   BILITOT 0.5 03/18/2021    PULMONARY FUNCTION TEST:   Recent Review Flowsheet Data   There is no flowsheet data to display.     RADIOGRAPHY: No results found.    IMPRESSION: Stage IIB (cT2, cN0, cM0) Left Breast UOQ, Invasive Ductal Carcinoma, ER- / PR- / Her2-, Grade 2  Patient will be a good candidate for breast conservation with radiotherapy to the left breast. We discussed the general course of radiation, potential side effects, and toxicities with radiation and the patient is interested in this approach.   Anticipate hypofractionated radiation over 3 weeks.   PLAN:  Left lumpectomy  Adjuvant radiation therapy    ------------------------------------------------  Blair Promise, PhD, MD  This document serves as a record of services personally performed by Gery Pray, MD. It was created on his behalf by Roney Mans, a trained medical scribe. The creation of this record is based on the scribe's personal observations and the provider's statements to them. This document has been checked and approved by the attending provider.

## 2021-03-18 ENCOUNTER — Inpatient Hospital Stay: Payer: Medicare PPO | Attending: Hematology and Oncology

## 2021-03-18 ENCOUNTER — Encounter: Payer: Self-pay | Admitting: Hematology and Oncology

## 2021-03-18 ENCOUNTER — Inpatient Hospital Stay (HOSPITAL_BASED_OUTPATIENT_CLINIC_OR_DEPARTMENT_OTHER): Payer: Medicare PPO | Admitting: Hematology and Oncology

## 2021-03-18 ENCOUNTER — Ambulatory Visit (HOSPITAL_BASED_OUTPATIENT_CLINIC_OR_DEPARTMENT_OTHER): Payer: Medicare PPO | Admitting: Genetic Counselor

## 2021-03-18 ENCOUNTER — Encounter: Payer: Self-pay | Admitting: Genetic Counselor

## 2021-03-18 ENCOUNTER — Other Ambulatory Visit: Payer: Self-pay | Admitting: General Surgery

## 2021-03-18 ENCOUNTER — Encounter: Payer: Self-pay | Admitting: *Deleted

## 2021-03-18 ENCOUNTER — Ambulatory Visit
Admission: RE | Admit: 2021-03-18 | Discharge: 2021-03-18 | Disposition: A | Discharge status: Home / Self Care | Payer: Medicare PPO | Source: Ambulatory Visit | Attending: Radiation Oncology | Admitting: Radiation Oncology

## 2021-03-18 ENCOUNTER — Ambulatory Visit: Payer: Medicare PPO | Admitting: Physical Therapy

## 2021-03-18 ENCOUNTER — Other Ambulatory Visit: Payer: Self-pay

## 2021-03-18 DIAGNOSIS — Z803 Family history of malignant neoplasm of breast: Secondary | ICD-10-CM | POA: Insufficient documentation

## 2021-03-18 DIAGNOSIS — Z171 Estrogen receptor negative status [ER-]: Secondary | ICD-10-CM

## 2021-03-18 DIAGNOSIS — C50412 Malignant neoplasm of upper-outer quadrant of left female breast: Secondary | ICD-10-CM

## 2021-03-18 DIAGNOSIS — K219 Gastro-esophageal reflux disease without esophagitis: Secondary | ICD-10-CM | POA: Insufficient documentation

## 2021-03-18 DIAGNOSIS — Z8042 Family history of malignant neoplasm of prostate: Secondary | ICD-10-CM

## 2021-03-18 HISTORY — DX: Family history of malignant neoplasm of prostate: Z80.42

## 2021-03-18 LAB — CMP (CANCER CENTER ONLY)
ALT: 20 U/L (ref 0–44)
AST: 20 U/L (ref 15–41)
Albumin: 4.1 g/dL (ref 3.5–5.0)
Alkaline Phosphatase: 43 U/L (ref 38–126)
Anion gap: 3 — ABNORMAL LOW (ref 5–15)
BUN: 16 mg/dL (ref 8–23)
CO2: 34 mmol/L — ABNORMAL HIGH (ref 22–32)
Calcium: 9.5 mg/dL (ref 8.9–10.3)
Chloride: 103 mmol/L (ref 98–111)
Creatinine: 0.66 mg/dL (ref 0.44–1.00)
GFR, Estimated: >60 mL/min (ref >60)
Glucose, Bld: 141 mg/dL — ABNORMAL HIGH (ref 70–99)
Potassium: 3.7 mmol/L (ref 3.5–5.1)
Sodium: 140 mmol/L (ref 135–145)
Total Bilirubin: 0.5 mg/dL (ref 0.3–1.2)
Total Protein: 6.4 g/dL — ABNORMAL LOW (ref 6.5–8.1)

## 2021-03-18 LAB — CBC WITH DIFFERENTIAL (CANCER CENTER ONLY)
Abs Immature Granulocytes: 0.02 10*3/uL (ref 0.00–0.07)
Basophils Absolute: 0.1 10*3/uL (ref 0.0–0.1)
Basophils Relative: 1 %
Eosinophils Absolute: 0.1 10*3/uL (ref 0.0–0.5)
Eosinophils Relative: 1 %
HCT: 37.7 % (ref 36.0–46.0)
Hemoglobin: 12.1 g/dL (ref 12.0–15.0)
Immature Granulocytes: 0 %
Lymphocytes Relative: 22 %
Lymphs Abs: 1.2 10*3/uL (ref 0.7–4.0)
MCH: 27.9 pg (ref 26.0–34.0)
MCHC: 32.1 g/dL (ref 30.0–36.0)
MCV: 87.1 fL (ref 80.0–100.0)
Monocytes Absolute: 0.3 10*3/uL (ref 0.1–1.0)
Monocytes Relative: 5 %
Neutro Abs: 3.8 10*3/uL (ref 1.7–7.7)
Neutrophils Relative %: 71 %
Platelet Count: 244 10*3/uL (ref 150–400)
RBC: 4.33 MIL/uL (ref 3.87–5.11)
RDW: 14.2 % (ref 11.5–15.5)
WBC Count: 5.3 10*3/uL (ref 4.0–10.5)
nRBC: 0 % (ref 0.0–0.2)

## 2021-03-18 LAB — GENETIC SCREENING ORDER

## 2021-03-18 NOTE — Research (Signed)
[employer] 2021-05 - Specimen Collection Study to Evaluate Biomarkers in Subjects with Cancer    Patient Sierra Bentley was identified by Dr. Lindi Adie as a potential candidate for the above listed study.  This Clinical Research Nurse met with Sierra Bentley, [id number], on 03/18/21 in a manner and location that ensures patient privacy to discuss participation in the above listed research study.  Patient is Unaccompanied.  A copy of the informed consent document with embedded HIPAA language was provided to the patient.  Patient reads, speaks, and understands [place].   Patient was provided with the business card of this Nurse and encouraged to contact the research team with any questions.  Approximately 20 minutes was spent with the patient reviewing the informed consent documents.  Patient was provided the option of taking informed consent documents home to review and was encouraged to review at their convenience with their support network, including other care providers. Patient took the consent documents home to review.  The pt was told that her participation is voluntary.  The pt was told that if she is interested in participating in the study then she would have to sign the consent form and have her blood drawn before surgery and before she receives any treatment.  The pt verbalized understanding.  The pt states she does not drive often, and her son would have to drive her to the [facility].  The nurse stated that she would call the pt next week to answer any questions and to set up an appt date/time if she wants to participate in the study.  The pt was thanked for her consideration of this study.  Brion Aliment RN, BSN, CCRP Clinical Research Nurse Lead 03/18/2021 5:05 PM

## 2021-03-18 NOTE — Assessment & Plan Note (Signed)
03/10/2021:Screening mammogram detected left breast mass at 12 o'clock position: 1.6 cm, by ultrasound retroareolar mass 3.5 x 1.4 x 2.2 cm, additional mass 0.8 cm: Benign on biopsy, axilla negative  Pathology and radiology counseling: Discussed with the patient, the details of pathology including the type of breast cancer,the clinical staging, the significance of ER, PR and HER-2/neu receptors and the implications for treatment. After reviewing the pathology in detail, we proceeded to discuss the different treatment options between surgery, radiation.  1.  Breast conserving surgery 2. adjuvant radiation  Being triple negative disease there is no role of antiestrogen therapy. Given her age she is not a good candidate for systemic chemotherapy.

## 2021-03-19 ENCOUNTER — Encounter: Payer: Self-pay | Admitting: *Deleted

## 2021-03-19 ENCOUNTER — Ambulatory Visit (INDEPENDENT_AMBULATORY_CARE_PROVIDER_SITE_OTHER): Payer: Medicare PPO | Admitting: Family Medicine

## 2021-03-19 ENCOUNTER — Encounter: Payer: Self-pay | Admitting: Family Medicine

## 2021-03-19 ENCOUNTER — Telehealth: Payer: Self-pay

## 2021-03-19 VITALS — BP 118/66 | HR 82 | Temp 97.8°F | Ht 63.25 in | Wt 108.0 lb

## 2021-03-19 DIAGNOSIS — K219 Gastro-esophageal reflux disease without esophagitis: Secondary | ICD-10-CM

## 2021-03-19 DIAGNOSIS — M069 Rheumatoid arthritis, unspecified: Secondary | ICD-10-CM

## 2021-03-19 DIAGNOSIS — I10 Essential (primary) hypertension: Secondary | ICD-10-CM | POA: Diagnosis not present

## 2021-03-19 DIAGNOSIS — R7303 Prediabetes: Secondary | ICD-10-CM

## 2021-03-19 DIAGNOSIS — M810 Age-related osteoporosis without current pathological fracture: Secondary | ICD-10-CM | POA: Diagnosis not present

## 2021-03-19 DIAGNOSIS — Z1211 Encounter for screening for malignant neoplasm of colon: Secondary | ICD-10-CM | POA: Diagnosis not present

## 2021-03-19 DIAGNOSIS — C50412 Malignant neoplasm of upper-outer quadrant of left female breast: Secondary | ICD-10-CM

## 2021-03-19 DIAGNOSIS — Z0000 Encounter for general adult medical examination without abnormal findings: Secondary | ICD-10-CM | POA: Diagnosis not present

## 2021-03-19 DIAGNOSIS — Z79899 Other long term (current) drug therapy: Secondary | ICD-10-CM | POA: Diagnosis not present

## 2021-03-19 DIAGNOSIS — E7800 Pure hypercholesterolemia, unspecified: Secondary | ICD-10-CM

## 2021-03-19 LAB — LIPID PANEL
Cholesterol: 165 mg/dL (ref 0–200)
HDL: 65.1 mg/dL (ref >39.00)
LDL Cholesterol: 87 mg/dL (ref 0–99)
NonHDL: 100.02
Total CHOL/HDL Ratio: 3
Triglycerides: 66 mg/dL (ref 0.0–149.0)
VLDL: 13.2 mg/dL (ref 0.0–40.0)

## 2021-03-19 LAB — VITAMIN B12: Vitamin B-12: 757 pg/mL (ref 211–911)

## 2021-03-19 LAB — HEMOGLOBIN A1C: Hgb A1c MFr Bld: 5.4 % (ref 4.6–6.5)

## 2021-03-19 MED ORDER — PANTOPRAZOLE SODIUM 40 MG PO TBEC: 40.0000 mg | DELAYED_RELEASE_TABLET | Freq: Every day | ORAL | 3 refills | Status: DC | PRN

## 2021-03-19 MED ORDER — LOSARTAN POTASSIUM-HCTZ 50-12.5 MG PO TABS: 0.5000 | ORAL_TABLET | Freq: Every evening | ORAL | 3 refills | Status: DC

## 2021-03-19 NOTE — Progress Notes (Signed)
Subjective:    Patient ID: Sierra Bentley, female    DOB: 1933-12-07, 88 y.o.   MRN: [id number]  This visit occurred during the SARS-CoV-2 public health emergency.  Safety protocols were in place, including screening questions prior to the visit, additional usage of staff PPE, and extensive cleaning of exam room while observing appropriate contact time as indicated for disinfecting solutions.   HPI Here for health maintenance exam and to review chronic medical problems    Wt Readings from Last 3 Encounters:  03/19/21 108 lb (49 kg)  03/18/21 108 lb 8 oz (49.2 kg)  12/16/20 114 lb (51.7 kg)   18.98 kg/m  Eats all the time and has a hard time gaining weight  Stress may play a role    Husband died in February 13, 2022  Her dog died also (aug) Had covid  Then dx with breast cancer   Emotionally doing fair  She has good faith / enjoys her christian TV programs   Had amw in 11/22 -reviewed   Got over covid from 02-13-22   Covid vaccine 2021 Zoster status-shingrix in 08/2020 Td 2018 Pna vaccines utd  Flu shot 12/2020  Mammogram 02/2021-breast cancer, L breast mass stage 2B ER negative  (triple negative) Dr Lindi Adie -oncology  Surgeon -Dr Emmie Niemann   Planning  breast conserving surgery and radiation  Under care of oncology  Needs FMLA paperwork filled out for her son for this and chronic medical needs  He will take her to [facility] for her cancer  She will have 3 weeks of rad tx   Dexa 10/2020 Osteoporosis  Followed by gyn  (Dr Scherrie Bateman) end of last year   Hip fracture in 2020   Has oph appt tomorrow   Had [place] of uterus at gyn for some pelvic pain  Removed 1-2 polyps from uterus   Out aged colon cancer screening  GI was Dr Cristina Gong (retired)  -wanted to do another colonoscopy despite her age  She takes align Needs [place] to take over her protonix  (only takes it when she needs it 3 times per week)   Lab Results  Component Value Date   [id number] 366 11/29/2017     Was recently  dx with breast cancer/under care of Dr Lindi Adie  HTN  bp is stable today  No cp or palpitations or headaches or edema  No side effects to medicines  BP Readings from Last 3 Encounters:  03/19/21 118/66  03/18/21 127/88  12/10/20 98/62     Pulse Readings from Last 3 Encounters:  03/19/21 82  03/18/21 83  12/10/20 91    Takes losartan hct 50-12.5 half pill daily   GERD- protonix prn    H/o rheumatoid arthritis without treatment   Hyperlipidemia  Lab Results  Component Value Date   CHOL 184 12/06/2019   HDL 64.60 12/06/2019   LDLCALC 103 (H) 12/06/2019   LDLDIRECT 130.4 02/25/2012   TRIG 84.0 12/06/2019   CHOLHDL 3 12/06/2019   Prediabetes Lab Results  Component Value Date   HGBA1C 5.8 12/06/2019  Has a craving for ice cream lately  Hard time keeping weight on   Glucose was 141 non fasting yesterday    Lab Results  Component Value Date   CREATININE 0.66 03/18/2021   BUN 16 03/18/2021   NA 140 03/18/2021   K 3.7 03/18/2021   CL 103 03/18/2021   CO2 34 (H) 03/18/2021   Lab Results  Component Value Date   ALT 20 03/18/2021  AST 20 03/18/2021   ALKPHOS 43 03/18/2021   BILITOT 0.5 03/18/2021    Glucose 141   CBC    Component Value Date/Time   WBC 5.3 03/18/2021 1213   WBC 5.1 11/24/2020 1321   RBC 4.33 03/18/2021 1213   HGB 12.1 03/18/2021 1213   HCT 37.7 03/18/2021 1213   PLT 244 03/18/2021 1213   MCV 87.1 03/18/2021 1213   MCH 27.9 03/18/2021 1213   MCHC 32.1 03/18/2021 1213   RDW 14.2 03/18/2021 1213   LYMPHSABS 1.2 03/18/2021 1213   MONOABS 0.3 03/18/2021 1213   EOSABS 0.1 03/18/2021 1213   BASOSABS 0.1 03/18/2021 1213   Patient Active Problem List   Diagnosis Date Noted   Current use of proton pump inhibitor 03/19/2021   Family history of prostate cancer 03/18/2021   Malignant neoplasm of upper-outer quadrant of left breast in female, estrogen receptor negative ([place]) 03/17/2021   Lump of left thigh 05/07/2019   Poor balance 05/07/2019    Vestibular dizziness 03/31/2018   History of hip fracture 02/27/2018   Pedal edema 09/28/2017   Fall at home 09/28/2017   Prediabetes 11/30/2016   Toenail fungus 10/03/2015   At moderate risk for fall 09/05/2015   Cervical spondylosis without myelopathy 08/05/2015   Routine general medical examination at a health care facility 08/27/2014   Colon cancer screening 08/27/2014   Fall against object 08/15/2014   Encounter for Medicare annual wellness exam 07/25/2013   Varicosities of leg 05/23/2012   Abnormal ultrasound of thyroid gland 03/01/2011   Hyperlipidemia 06/11/2008   Allergic rhinitis 03/29/2008   Essential hypertension, benign 12/26/2006   GERD 12/26/2006   Rheumatoid arthritis ([place]) 12/26/2006   Osteoporosis 12/26/2006   URINARY INCONTINENCE 12/26/2006   Past Medical History:  Diagnosis Date   Allergic rhinitis, cause unspecified    Anemia    Breast cancer ([place])    Chronically dry eyes, bilateral    Diverticulosis of colon    Endometrial polyp    Family history of prostate cancer 03/18/2021   Fuchs' corneal dystrophy of right eye    GERD (gastroesophageal reflux disease)    Hearing loss of both ears    pt stated has hearing aids but does not wear   History of epistaxis    per pt has had several cautization's for nose bleed   HTN (hypertension)    Hyperlipidemia    Macular degeneration of both eyes    followed @[facility]  by dr Mamie Nick. mettu;   both age-related and right is with active choroidal neovascularization   OA (osteoarthritis)    OAB (overactive bladder)    Osteopenia    Thickened endometrium    Urge incontinence of urine    Wears glasses    Past Surgical History:  Procedure Laterality Date   BREAST BIOPSY Left 2011   fibrocystic change   CATARACT EXTRACTION W/ INTRAOCULAR LENS IMPLANT Bilateral 2008   COLONOSCOPY  2013   DILATATION & CURETTAGE/HYSTEROSCOPY WITH MYOSURE N/A 11/26/2020   Procedure: DILATATION & CURETTAGE/HYSTEROSCOPY WITH MYOSURE;  Surgeon:  Princess Bruins, MD;  Location: [facility];  Service: Gynecology;  Laterality: N/A;   TONSILLECTOMY  1962   Social History   Tobacco Use   Smoking status: Never    Passive exposure: Never   Smokeless tobacco: Never  Vaping Use   Vaping Use: Never used  Substance Use Topics   Alcohol use: No    Alcohol/week: 0.0 standard drinks   Drug use: Never   Family History  Problem Relation Age of Onset   Other Mother        Arrythmia   Coronary artery disease Mother    Hypertension Mother    Anxiety disorder Mother    Heart attack Father 29   Coronary artery disease Father    Prostate cancer Father        dx unknown age   Throat cancer Brother        d. 20   Cancer Other        two maternal female cousins; unknown cancer; dx after 83   Depression Other        family history   Allergies  Allergen Reactions   Ace Inhibitors Cough   Boniva [Ibandronic Acid]     Esophageal distress   Chocolate Other (See Comments)    Severe headache and stopped up sinuses   Fosamax [Alendronate]     Esophageal distress   Oxycodone Other (See Comments)    Dizziness resulting in fall    Current Outpatient Medications on File Prior to Visit  Medication Sig Dispense Refill   Multiple Vitamin (MULTIVITAMIN) tablet Take 1 tablet by mouth daily. Per pt has Postmenopausal multivitamin packet with 5 pills     Polyethyl Glycol-Propyl Glycol (SYSTANE ULTRA OP) Place 1 drop into both eyes 4 (four) times daily.       sodium chloride (MURO 128) 5 % ophthalmic ointment Place 1 application into both eyes at bedtime.      sodium chloride (MURO 128) 5 % ophthalmic solution Place 1 drop into both eyes daily.     TURMERIC PO Take 1,300 mg by mouth in the morning and at bedtime.     No current facility-administered medications on file prior to visit.    Review of Systems  Constitutional:  Positive for fatigue. Negative for activity change, appetite change, fever and unexpected weight change.   HENT:  Negative for congestion, ear pain, rhinorrhea, sinus pressure and sore throat.   Eyes:  Negative for pain, redness and visual disturbance.  Respiratory:  Negative for cough, shortness of breath and wheezing.   Cardiovascular:  Negative for chest pain and palpitations.  Gastrointestinal:  Negative for abdominal pain, blood in stool, constipation and diarrhea.  Endocrine: Negative for polydipsia and polyuria.  Genitourinary:  Negative for dysuria, frequency and urgency.  Musculoskeletal:  Negative for arthralgias, back pain and myalgias.  Skin:  Negative for pallor and rash.  Allergic/Immunologic: Negative for environmental allergies.  Neurological:  Negative for dizziness, syncope and headaches.  Hematological:  Negative for adenopathy. Does not bruise/bleed easily.  Psychiatric/Behavioral:  Negative for decreased concentration and dysphoric mood. The patient is not nervous/anxious.        Doing ok with grief       Objective:   Physical Exam Constitutional:      General: She is not in acute distress.    Appearance: Normal appearance. She is well-developed. She is not ill-appearing or diaphoretic.  HENT:     Head: Normocephalic and atraumatic.     Right Ear: Tympanic membrane, ear canal and external ear normal.     Left Ear: Tympanic membrane, ear canal and external ear normal.     Nose: Nose normal. No congestion.     Mouth/Throat:     Mouth: Mucous membranes are moist.     Pharynx: Oropharynx is clear. No posterior oropharyngeal erythema.  Eyes:     General: No scleral icterus.    Extraocular Movements: Extraocular movements intact.  Conjunctiva/sclera: Conjunctivae normal.     Pupils: Pupils are equal, round, and reactive to light.  Neck:     Thyroid: No thyromegaly.     Vascular: No carotid bruit or JVD.  Cardiovascular:     Rate and Rhythm: Normal rate and regular rhythm.     Pulses: Normal pulses.     Heart sounds: Normal heart sounds.    No gallop.   Pulmonary:     Effort: Pulmonary effort is normal. No respiratory distress.     Breath sounds: Normal breath sounds. No wheezing.     Comments: Good air exch Chest:     Chest wall: No tenderness.  Abdominal:     General: Bowel sounds are normal. There is no distension or abdominal bruit.     Palpations: Abdomen is soft. There is no mass.     Tenderness: There is no abdominal tenderness.     Hernia: No hernia is present.  Genitourinary:    Comments: Breast and pelvic exams are done gyn provider Musculoskeletal:        General: No tenderness. Normal range of motion.     Cervical back: Normal range of motion and neck supple. No rigidity. No muscular tenderness.     Right lower leg: No edema.     Left lower leg: No edema.  Lymphadenopathy:     Cervical: No cervical adenopathy.  Skin:    General: Skin is warm and dry.     Coloration: Skin is not pale.     Findings: No erythema or rash.     Comments: Solar lentigines diffusely   Neurological:     Mental Status: She is alert. Mental status is at baseline.     Cranial Nerves: No cranial nerve deficit.     Motor: No abnormal muscle tone.     Coordination: Coordination normal.     Gait: Gait normal.     Deep Tendon Reflexes: Reflexes are normal and symmetric. Reflexes normal.  Psychiatric:        Mood and Affect: Mood normal.        Cognition and Memory: Cognition and memory normal.          Assessment & Plan:   Problem List Items Addressed This Visit       Cardiovascular and Mediastinum   Essential hypertension, benign    bp in fair control at this time  BP Readings from Last 1 Encounters:  03/19/21 118/66  No changes needed Most recent labs reviewed  Disc lifstyle change with low sodium diet and exercise  Plan to continue the losartan 50-12.5 mg 1/2 pill daily      Relevant Medications   losartan-hydrochlorothiazide (HYZAAR) 50-12.5 MG tablet     Digestive   GERD    Takes protonix prn  Vit B12 added to list        Relevant Medications   pantoprazole (PROTONIX) 40 MG tablet     Musculoskeletal and Integument   Osteoporosis    dexa 10/2020 OP Followed by gyn  Hip fracture in 2020 No recent falls or fractures        Rheumatoid arthritis ([place])    No treatment  Stable         Other   Colon cancer screening    Pt has out aged colon cancer screening but had previously d/w her GI doctor (Buccini) getting another one   In the midst of breast cancer treatment currently       Current use of proton  pump inhibitor    Prn protonix Vit B12 level added to labs       Relevant Orders   Vitamin B12 (Completed)   Hyperlipidemia    Disc goals for lipids and reasons to control them Rev last labs with pt Rev low sat fat diet in detail Labs ordered  Good diet       Relevant Medications   losartan-hydrochlorothiazide (HYZAAR) 50-12.5 MG tablet   Other Relevant Orders   Lipid panel (Completed)   Prediabetes    A1C ordered Lab Results  Component Value Date   HGBA1C 5.4 03/19/2021         Relevant Orders   Hemoglobin A1c (Completed)   Routine general medical examination at a health care facility - Primary    Reviewed health habits including diet and exercise and skin cancer prevention Reviewed appropriate screening tests for age  Also reviewed health mt list, fam hx and immunization status , as well as social and family history   See HPI Labs reviewed  amw reviewed  In treatment for breast cancer dexa utd -followed by gyn, no recent falls or fractures oph visit is planned for tomorrow  Out aged colon cancer screen but pt may consider another colonoscopy later

## 2021-03-19 NOTE — Progress Notes (Signed)
REFERRING PROVIDER: Nicholas Lose, MD [place],  [place] [address]  PRIMARY PROVIDER:  Abner Greenspan, MD  PRIMARY REASON FOR VISIT:  1. Malignant neoplasm of upper-outer quadrant of left breast in female, estrogen receptor negative ([place])   2. Family history of prostate cancer     HISTORY OF PRESENT ILLNESS:   Sierra Bentley, a 88 y.o. female, was seen for a [facility] cancer genetics consultation at the request of Dr. Lindi Adie due to a personal history of cancer.  Sierra Bentley presents to clinic today to discuss the possibility of a hereditary predisposition to cancer, to discuss genetic testing, and to further clarify her future cancer risks, as well as potential cancer risks for family members.   In February 2022, at the age of 60, Sierra Bentley was diagnosed with invasive ductal carcinoma of the left breast (triple negative). The treatment plan is pending.    CANCER HISTORY:  Oncology History  Malignant neoplasm of upper-outer quadrant of left breast in female, estrogen receptor negative ([place])  03/10/2021 Initial Diagnosis   Screening mammogram detected left breast mass at 12 o'clock position: 1.6 cm, by ultrasound retroareolar mass 3.5 x 1.4 x 2.2 cm, additional mass 0.8 cm: Benign on biopsy, axilla negative   03/18/2021 Cancer Staging   Staging form: Breast, AJCC 8th Edition - Clinical stage from 03/18/2021: Stage IIB (cT2, cN0, cM0, G2, ER-, PR-, HER2-) - Signed by Nicholas Lose, MD on 03/18/2021 Stage prefix: Initial diagnosis Histologic grading system: 3 grade system      RISK FACTORS:  Menarche was at age 31.  Nulliparous. Menopausal status: postmenopausal.  HRT use: yes; unknown number of years Colonoscopy: yes;  2013 .   Past Medical History:  Diagnosis Date   Allergic rhinitis, cause unspecified    Anemia    Breast cancer ([place])    Chronically dry eyes, bilateral    Diverticulosis of colon    Endometrial polyp    Family history of prostate cancer  03/18/2021   Fuchs' corneal dystrophy of right eye    GERD (gastroesophageal reflux disease)    Hearing loss of both ears    pt stated has hearing aids but does not wear   History of epistaxis    per pt has had several cautization's for nose bleed   HTN (hypertension)    Hyperlipidemia    Macular degeneration of both eyes    followed _0  by dr Mamie Nick. mettu;   both age-related and right is with active choroidal neovascularization   OA (osteoarthritis)    OAB (overactive bladder)    Osteopenia    Thickened endometrium    Urge incontinence of urine    Wears glasses     Past Surgical History:  Procedure Laterality Date   BREAST BIOPSY Left 2011   fibrocystic change   CATARACT EXTRACTION W/ INTRAOCULAR LENS IMPLANT Bilateral 2008   COLONOSCOPY  2013   DILATATION & CURETTAGE/HYSTEROSCOPY WITH MYOSURE N/A 11/26/2020   Procedure: DILATATION & CURETTAGE/HYSTEROSCOPY WITH MYOSURE;  Surgeon: Princess Bruins, MD;  Location: [facility];  Service: Gynecology;  Laterality: N/A;   TONSILLECTOMY  1962    FAMILY HISTORY:  We obtained a detailed, 4-generation family history.  Significant diagnoses are listed below: Family History  Problem Relation Age of Onset   Prostate cancer Father        dx unknown age   Throat cancer Brother        d. 63   Cancer Other  two maternal female cousins; unknown cancer; dx after 60      Sierra Bentley is unaware of previous family history of genetic testing for hereditary cancer risks.  There is no reported Ashkenazi Jewish ancestry. There is no known consanguinity.  GENETIC COUNSELING ASSESSMENT: Sierra Bentley is a 88 y.o. female with a personal history of triple negative breast cancer which is somewhat suggestive of a hereditary cancer syndrome. We, therefore, discussed and recommended the following at today's visit.   DISCUSSION: We discussed that 5 - 10% of cancer is hereditary.  Most cases of hereditary breast cancer are associated with  mutations in BRCA1/2.  There are other genes that can be associated with hereditary breast cancer syndromes.  We discussed that testing is beneficial for several reasons including knowing how to follow individuals for their cancer risks, identifying whether potential treatment options would be beneficial, and understanding if other family members could be at risk for cancer and allowing them to undergo genetic testing.   We reviewed the characteristics, features and inheritance patterns of hereditary cancer syndromes. We also discussed genetic testing, including the appropriate family members to test, the process of testing, insurance coverage and turn-around-time for results. We discussed the implications of a negative, positive, carrier and/or variant of uncertain significant result. We recommended Sierra Bentley pursue genetic testing for a panel that includes genes associated with breast cancer.   Based on Sierra Bentley's personal history of triple negative breast cancer, she meets medical criteria for genetic testing. Despite that she meets criteria, she may still have an out of pocket cost.    PLAN: Sierra Bentley did not wish to pursue genetic testing at today's visit given that she does not have many biological family members still living. We understand this decision and remain available to coordinate genetic testing at any time in the future. We, therefore, recommend Sierra Bentley continue to follow the cancer screening guidelines given by her primary healthcare provider.   Sierra Bentley questions were answered to her satisfaction today. Our contact information was provided should additional questions or concerns arise. Thank you for the referral and allowing [place] to share in the care of your patient.   Abrahan Fulmore M. Joette Catching, [place], [facility] Genetic Counselor [email or web address] (P) [phone]  The patient was seen for a total of 20 minutes in face-to-face genetic counseling.  The patient was seen alone.  Drs.  Lindi Adie and/or Burr Medico were available to discuss this case as needed.    _______________________________________________________________________ For Office Staff:  Number of people involved in session: 1 Was an Intern/ student involved with case: no

## 2021-03-19 NOTE — Telephone Encounter (Signed)
[facility] radiology called and states pt would like copy of her labs mailed to her home. Attempted to call pt on both numbers provided; LVM on home number for call back to confirm it is her most recent labs she is requesting. Awaiting call back.

## 2021-03-19 NOTE — Patient Instructions (Addendum)
I will send for GI notes re: colonscopy   I will send your son's FMLA paperwork to your cancer specialists   I am happy to do anything else you need  Labs today   No change in medications   I will let you know if you need to cut back sugar Make sure you get protein with every meal   Nuts and nut butters and dairy products and lean meat and beans are all good   Stay active as much as you can

## 2021-03-20 ENCOUNTER — Telehealth: Payer: Self-pay | Admitting: Hematology and Oncology

## 2021-03-20 DIAGNOSIS — H353231 Exudative age-related macular degeneration, bilateral, with active choroidal neovascularization: Secondary | ICD-10-CM | POA: Diagnosis not present

## 2021-03-20 NOTE — Telephone Encounter (Signed)
Patient states she would like lab results from 03/18/2021.

## 2021-03-20 NOTE — Telephone Encounter (Signed)
.  Called patient to schedule appointment per 2/23 inbasket, patient is aware of date and time.

## 2021-03-21 NOTE — Assessment & Plan Note (Signed)
Reviewed health habits including diet and exercise and skin cancer prevention Reviewed appropriate screening tests for age  Also reviewed health mt list, fam hx and immunization status , as well as social and family history   See HPI Labs reviewed  amw reviewed  In treatment for breast cancer dexa utd -followed by gyn, no recent falls or fractures oph visit is planned for tomorrow  Out aged colon cancer screen but pt may consider another colonoscopy later

## 2021-03-21 NOTE — Assessment & Plan Note (Signed)
dexa 10/2020 OP Followed by gyn  Hip fracture in 2020 No recent falls or fractures

## 2021-03-21 NOTE — Assessment & Plan Note (Signed)
Prn protonix Vit B12 level added to labs

## 2021-03-21 NOTE — Assessment & Plan Note (Signed)
No treatment  Stable

## 2021-03-21 NOTE — Assessment & Plan Note (Signed)
Disc goals for lipids and reasons to control them Rev last labs with pt Rev low sat fat diet in detail Labs ordered  Good diet

## 2021-03-21 NOTE — Assessment & Plan Note (Signed)
bp in fair control at this time  BP Readings from Last 1 Encounters:  03/19/21 118/66   No changes needed Most recent labs reviewed  Disc lifstyle change with low sodium diet and exercise  Plan to continue the losartan 50-12.5 mg 1/2 pill daily

## 2021-03-21 NOTE — Assessment & Plan Note (Signed)
Takes protonix prn  Vit B12 added to list

## 2021-03-21 NOTE — Assessment & Plan Note (Signed)
Pt has out aged colon cancer screening but had previously d/w her GI doctor (Buccini) getting another one   In the midst of breast cancer treatment currently

## 2021-03-21 NOTE — Assessment & Plan Note (Signed)
A1C ordered Lab Results  Component Value Date   HGBA1C 5.4 03/19/2021

## 2021-03-23 ENCOUNTER — Inpatient Hospital Stay
Admission: RE | Admit: 2021-03-23 | Discharge: 2021-03-23 | Disposition: A | Discharge status: Home / Self Care | Payer: Self-pay | Source: Ambulatory Visit | Attending: Radiation Oncology | Admitting: Radiation Oncology

## 2021-03-23 ENCOUNTER — Other Ambulatory Visit: Payer: Self-pay | Admitting: Radiation Oncology

## 2021-03-23 ENCOUNTER — Telehealth: Payer: Self-pay | Admitting: Family Medicine

## 2021-03-23 ENCOUNTER — Ambulatory Visit
Admission: RE | Admit: 2021-03-23 | Discharge: 2021-03-23 | Disposition: A | Discharge status: Home / Self Care | Payer: Self-pay | Source: Ambulatory Visit | Attending: Radiation Oncology | Admitting: Radiation Oncology

## 2021-03-23 ENCOUNTER — Telehealth: Payer: Self-pay | Admitting: *Deleted

## 2021-03-23 DIAGNOSIS — C50412 Malignant neoplasm of upper-outer quadrant of left female breast: Secondary | ICD-10-CM

## 2021-03-23 NOTE — Telephone Encounter (Signed)
Per Dr. Glori Bickers. Pt's oncologist needs to fill out FMLA since they are the treating providers.   Called son and left VM letting pt know to have employer send FMLA forms directly to oncologist/ rad/onc

## 2021-03-23 NOTE — Telephone Encounter (Signed)
[employer] 2021-05 - Specimen Collection Study to Evaluate Biomarkers in Subjects with Cancer    The research nurse called and spoke to the pt about the study this morning.  The pt said that she had not read the consent form yet, but she was still interested in participating in the study.  The pt confirmed that her surgery is scheduled for 04/13/21.  The pt requested to meet with the research nurse tomorrow at 3 pm to sign the consent form and have her study blood drawn.  The pt said that she would read the consent form today and write down any questions.  The nurse told the pt that she would answer all of her research study questions tomorrow before she signs the consent form.  The pt thanked the nurse for following up with her.  The pt said that her son will bring her tomorrow to the [facility]  at 3 pm.  The nurse informed the pt that if anything changes, to please call the nurse.  The pt was thanked for her consideration and interest in this study. The nurse will have a second nurse check the pt's eligibility today.   Brion Aliment RN, BSN, CCRP Clinical Research Nurse Lead 03/23/2021 11:48 AM

## 2021-03-23 NOTE — Telephone Encounter (Signed)
PT son called to follow up on FMLA paperwork that was faxed

## 2021-03-24 ENCOUNTER — Inpatient Hospital Stay: Payer: Medicare PPO

## 2021-03-24 ENCOUNTER — Inpatient Hospital Stay: Payer: Medicare PPO | Admitting: *Deleted

## 2021-03-24 ENCOUNTER — Telehealth: Payer: Self-pay | Admitting: *Deleted

## 2021-03-24 ENCOUNTER — Encounter: Payer: Self-pay | Admitting: *Deleted

## 2021-03-24 NOTE — Telephone Encounter (Signed)
[employer] 2021-05 - Specimen Collection Study to Evaluate Biomarkers in Subjects with Cancer    The pt called the research nurse this afternoon to discuss her study participation in the [employer] study.  The pt said that she had read over the consent form, and she has decided to not participate in the study.  The pt said that the $50 is not worth the worry she would feel if she consented to the study.  The pt said that she thought that it was a "one time blood draw", but she read that her tissue would also be used.  The research nurse answered several questions about the study details, and the pt said that she wanted to cancel her appt for this afternoon.  The nurse informed the pt that if she changes her mind then she could still participate as long as she consents to the study before her scheduled surgery on 04/13/21.  The pt thanked the nurse and said that she will pass on this study.  The pt was wished well with her upcoming surgery and thanked for her consideration of this study.  Brion Aliment RN, BSN, CCRP Clinical Research Nurse Lead 03/24/2021 2:17 PM

## 2021-03-24 NOTE — Progress Notes (Signed)
Per pt request, RN mailed recent lab and pathology report to pt address on file.

## 2021-03-25 ENCOUNTER — Telehealth: Payer: Self-pay | Admitting: Family Medicine

## 2021-03-25 NOTE — Telephone Encounter (Signed)
Forms faxed

## 2021-03-25 NOTE — Telephone Encounter (Signed)
Pt son called stating that he would like the FMLA papers for him concerning pt faxed to the Oncology. Please advise.  ?

## 2021-03-26 ENCOUNTER — Telehealth: Payer: Self-pay | Admitting: *Deleted

## 2021-03-26 ENCOUNTER — Encounter: Payer: Self-pay | Admitting: Family Medicine

## 2021-03-26 ENCOUNTER — Encounter: Payer: Self-pay | Admitting: *Deleted

## 2021-03-26 NOTE — Telephone Encounter (Signed)
Left vm regarding [facility] from 2.22.23. Contact information provided for questions or needs. ?

## 2021-04-01 ENCOUNTER — Encounter (HOSPITAL_BASED_OUTPATIENT_CLINIC_OR_DEPARTMENT_OTHER): Payer: Self-pay | Admitting: General Surgery

## 2021-04-01 ENCOUNTER — Other Ambulatory Visit: Payer: Self-pay

## 2021-04-02 ENCOUNTER — Encounter: Payer: Self-pay | Admitting: General Practice

## 2021-04-02 ENCOUNTER — Encounter: Payer: Self-pay | Admitting: *Deleted

## 2021-04-02 NOTE — Progress Notes (Signed)
[employer] FMLA forms completed for son and sent to Dr. Lindi Adie for signature. ?

## 2021-04-02 NOTE — Progress Notes (Signed)
[facility] Psychosocial Distress Screening ?Spiritual Care ? ?Met with Krysia by phone following [facility] to introduce [place] team/resources, reviewing distress screen per protocol.  The patient scored a  [unspecified]  on the Psychosocial Distress Thermometer which indicates  [unspecified]  distress. Also assessed for distress and other psychosocial needs.  ? ?ON[facility]N DISTRESS SCREENING 04/02/2021  ?Screening Type Initial Screening  ?Information Concerns Type Lack of info about treatment;Lack of info about complementary therapy choices;Lack of info about maintaining fitness  ?Referral to support programs Yes  ? ?Ms Pulse reports that her very strong faith is getting her through. She is grieving the loss of her husband, who died unexpectedly in Feb 07, 2022, as well as their beloved Cora Daniels, who died last 05-09-22. Ensured that she is aware that hospice organizations offer free grief counseling. ? ?Provided empathic listening, normalization of feelings, emotional support, and introduction to [facility] support programming. ? ?Follow up needed: No. Ms Zeiders plans to reach out to chaplain as needed/desired. ? ? ?Chaplain Lorrin Jackson, MDiv, [facility] ?Pager [phone] ?Voicemail [phone] ? ? ? ? ?  ?

## 2021-04-03 ENCOUNTER — Encounter (HOSPITAL_BASED_OUTPATIENT_CLINIC_OR_DEPARTMENT_OTHER)
Admission: RE | Admit: 2021-04-03 | Discharge: 2021-04-03 | Disposition: A | Discharge status: Home / Self Care | Payer: Medicare PPO | Source: Ambulatory Visit | Attending: General Surgery | Admitting: General Surgery

## 2021-04-03 DIAGNOSIS — Z01812 Encounter for preprocedural laboratory examination: Secondary | ICD-10-CM | POA: Insufficient documentation

## 2021-04-03 LAB — BASIC METABOLIC PANEL
Anion gap: 8 (ref 5–15)
BUN: 19 mg/dL (ref 8–23)
CO2: 28 mmol/L (ref 22–32)
Calcium: 9.4 mg/dL (ref 8.9–10.3)
Chloride: 103 mmol/L (ref 98–111)
Creatinine, Ser: 0.65 mg/dL (ref 0.44–1.00)
GFR, Estimated: >60 mL/min (ref >60)
Glucose, Bld: 86 mg/dL (ref 70–99)
Potassium: 4.3 mmol/L (ref 3.5–5.1)
Sodium: 139 mmol/L (ref 135–145)

## 2021-04-03 MED ORDER — ENSURE PRE-SURGERY PO LIQD: 296.0000 mL | Freq: Once | ORAL | Status: DC

## 2021-04-03 NOTE — Progress Notes (Signed)

## 2021-04-06 ENCOUNTER — Telehealth: Payer: Self-pay | Admitting: *Deleted

## 2021-04-06 NOTE — Telephone Encounter (Signed)
[facility] FMLA FORM successfully returned for [employer] grandson, [employer].  Original to alphabetical file folder behind registration area one for patient pick up on next scheduled f/u 04/22/2021. ?

## 2021-04-10 DIAGNOSIS — C50812 Malignant neoplasm of overlapping sites of left female breast: Secondary | ICD-10-CM | POA: Diagnosis not present

## 2021-04-13 ENCOUNTER — Other Ambulatory Visit: Payer: Self-pay

## 2021-04-13 ENCOUNTER — Encounter (HOSPITAL_BASED_OUTPATIENT_CLINIC_OR_DEPARTMENT_OTHER): Payer: Self-pay | Admitting: General Surgery

## 2021-04-13 ENCOUNTER — Ambulatory Visit (HOSPITAL_BASED_OUTPATIENT_CLINIC_OR_DEPARTMENT_OTHER)
Admission: RE | Admit: 2021-04-13 | Discharge: 2021-04-13 | Disposition: A | Discharge status: Home / Self Care | Payer: Medicare PPO | Attending: General Surgery | Admitting: General Surgery

## 2021-04-13 ENCOUNTER — Ambulatory Visit (HOSPITAL_BASED_OUTPATIENT_CLINIC_OR_DEPARTMENT_OTHER): Payer: Medicare PPO | Admitting: Anesthesiology

## 2021-04-13 ENCOUNTER — Encounter (HOSPITAL_BASED_OUTPATIENT_CLINIC_OR_DEPARTMENT_OTHER)
Admission: RE | Disposition: A | Discharge status: Home / Self Care | Payer: Self-pay | Source: Home / Self Care | Attending: General Surgery

## 2021-04-13 DIAGNOSIS — N641 Fat necrosis of breast: Secondary | ICD-10-CM | POA: Diagnosis not present

## 2021-04-13 DIAGNOSIS — C50912 Malignant neoplasm of unspecified site of left female breast: Secondary | ICD-10-CM | POA: Diagnosis not present

## 2021-04-13 DIAGNOSIS — C50412 Malignant neoplasm of upper-outer quadrant of left female breast: Secondary | ICD-10-CM | POA: Diagnosis not present

## 2021-04-13 DIAGNOSIS — K219 Gastro-esophageal reflux disease without esophagitis: Secondary | ICD-10-CM | POA: Insufficient documentation

## 2021-04-13 DIAGNOSIS — I10 Essential (primary) hypertension: Secondary | ICD-10-CM | POA: Insufficient documentation

## 2021-04-13 DIAGNOSIS — Z171 Estrogen receptor negative status [ER-]: Secondary | ICD-10-CM | POA: Diagnosis not present

## 2021-04-13 DIAGNOSIS — R920 Mammographic microcalcification found on diagnostic imaging of breast: Secondary | ICD-10-CM | POA: Diagnosis not present

## 2021-04-13 HISTORY — PX: BREAST LUMPECTOMY WITH RADIOACTIVE SEED LOCALIZATION: SHX6424

## 2021-04-13 SURGERY — BREAST LUMPECTOMY WITH RADIOACTIVE SEED LOCALIZATION
Anesthesia: General | Site: Breast | Laterality: Left

## 2021-04-13 MED ORDER — CEFAZOLIN SODIUM-DEXTROSE 2-4 GM/100ML-% IV SOLN
INTRAVENOUS | Status: AC
  Filled 2021-04-13: qty 100

## 2021-04-13 MED ORDER — AMISULPRIDE (ANTIEMETIC) 5 MG/2ML IV SOLN
10.0000 mg | Freq: Once | INTRAVENOUS | Status: AC | PRN
  Administered 2021-04-13: 10 mg via INTRAVENOUS

## 2021-04-13 MED ORDER — ACETAMINOPHEN 500 MG PO TABS
1000.0000 mg | ORAL_TABLET | Freq: Once | ORAL | Status: AC
  Administered 2021-04-13: 650 mg via ORAL

## 2021-04-13 MED ORDER — PHENYLEPHRINE 40 MCG/ML (10ML) SYRINGE FOR IV PUSH (FOR BLOOD PRESSURE SUPPORT)
PREFILLED_SYRINGE | INTRAVENOUS | Status: AC
  Filled 2021-04-13: qty 10

## 2021-04-13 MED ORDER — AMISULPRIDE (ANTIEMETIC) 5 MG/2ML IV SOLN
INTRAVENOUS | Status: AC
  Filled 2021-04-13: qty 4

## 2021-04-13 MED ORDER — LACTATED RINGERS IV SOLN
INTRAVENOUS | Status: DC
  Administered 2021-04-13: 10 mL/h via INTRAVENOUS

## 2021-04-13 MED ORDER — ONDANSETRON HCL 4 MG/2ML IJ SOLN
INTRAMUSCULAR | Status: DC | PRN
  Administered 2021-04-13: 4 mg via INTRAVENOUS

## 2021-04-13 MED ORDER — FENTANYL CITRATE (PF) 100 MCG/2ML IJ SOLN
25.0000 ug | INTRAMUSCULAR | Status: DC | PRN
  Administered 2021-04-13: 50 ug via INTRAVENOUS
  Administered 2021-04-13 (×2): 25 ug via INTRAVENOUS
  Administered 2021-04-13: 50 ug via INTRAVENOUS

## 2021-04-13 MED ORDER — TRAMADOL HCL 50 MG PO TABS: 50.0000 mg | ORAL_TABLET | Freq: Four times a day (QID) | ORAL | 0 refills | Status: DC | PRN

## 2021-04-13 MED ORDER — ONDANSETRON HCL 4 MG/2ML IJ SOLN
INTRAMUSCULAR | Status: AC
  Filled 2021-04-13: qty 2

## 2021-04-13 MED ORDER — ACETAMINOPHEN 500 MG PO TABS
ORAL_TABLET | ORAL | Status: AC
  Filled 2021-04-13: qty 2

## 2021-04-13 MED ORDER — FENTANYL CITRATE (PF) 100 MCG/2ML IJ SOLN
INTRAMUSCULAR | Status: AC
  Filled 2021-04-13: qty 2

## 2021-04-13 MED ORDER — PROPOFOL 10 MG/ML IV BOLUS
INTRAVENOUS | Status: AC
  Filled 2021-04-13: qty 20

## 2021-04-13 MED ORDER — PROPOFOL 10 MG/ML IV BOLUS
INTRAVENOUS | Status: DC | PRN
  Administered 2021-04-13: 20 mg via INTRAVENOUS
  Administered 2021-04-13: 80 mg via INTRAVENOUS

## 2021-04-13 MED ORDER — CHLORHEXIDINE GLUCONATE CLOTH 2 % EX PADS: 6.0000 | MEDICATED_PAD | Freq: Once | CUTANEOUS | Status: DC

## 2021-04-13 MED ORDER — BUPIVACAINE HCL (PF) 0.25 % IJ SOLN
INTRAMUSCULAR | Status: AC
  Filled 2021-04-13: qty 30

## 2021-04-13 MED ORDER — LIDOCAINE HCL (CARDIAC) PF 100 MG/5ML IV SOSY
PREFILLED_SYRINGE | INTRAVENOUS | Status: DC | PRN
  Administered 2021-04-13: 40 mg via INTRATRACHEAL

## 2021-04-13 MED ORDER — ONDANSETRON HCL 4 MG/2ML IJ SOLN: 4.0000 mg | Freq: Once | INTRAMUSCULAR | Status: DC | PRN

## 2021-04-13 MED ORDER — PHENYLEPHRINE HCL (PRESSORS) 10 MG/ML IV SOLN
INTRAVENOUS | Status: DC | PRN
  Administered 2021-04-13 (×2): 40 ug via INTRAVENOUS

## 2021-04-13 MED ORDER — LIDOCAINE 2% (20 MG/ML) 5 ML SYRINGE
INTRAMUSCULAR | Status: AC
  Filled 2021-04-13: qty 5

## 2021-04-13 MED ORDER — ACETAMINOPHEN 325 MG PO TABS
ORAL_TABLET | ORAL | Status: AC
  Filled 2021-04-13: qty 2

## 2021-04-13 MED ORDER — CEFAZOLIN SODIUM-DEXTROSE 2-4 GM/100ML-% IV SOLN
2.0000 g | INTRAVENOUS | Status: AC
  Administered 2021-04-13: 2 g via INTRAVENOUS

## 2021-04-13 MED ORDER — FENTANYL CITRATE (PF) 100 MCG/2ML IJ SOLN
INTRAMUSCULAR | Status: DC | PRN
  Administered 2021-04-13 (×2): 25 ug via INTRAVENOUS

## 2021-04-13 MED ORDER — BUPIVACAINE HCL (PF) 0.25 % IJ SOLN
INTRAMUSCULAR | Status: DC | PRN
  Administered 2021-04-13: 10 mL

## 2021-04-13 MED ORDER — 0.9 % SODIUM CHLORIDE (POUR BTL) OPTIME
TOPICAL | Status: DC | PRN
  Administered 2021-04-13: 100 mL

## 2021-04-13 MED ORDER — DEXAMETHASONE SODIUM PHOSPHATE 10 MG/ML IJ SOLN
INTRAMUSCULAR | Status: DC | PRN
  Administered 2021-04-13: 4 mg via INTRAVENOUS

## 2021-04-13 SURGICAL SUPPLY — 61 items
ADH SKN CLS APL DERMABOND .7 (GAUZE/BANDAGES/DRESSINGS) ×1
APL PRP STRL LF DISP 70% ISPRP (MISCELLANEOUS) ×1
APPLIER CLIP 9.375 MED OPEN (MISCELLANEOUS)
APR CLP MED 9.3 20 MLT OPN (MISCELLANEOUS)
BINDER BREAST LRG (GAUZE/BANDAGES/DRESSINGS) IMPLANT
BINDER BREAST MEDIUM (GAUZE/BANDAGES/DRESSINGS) ×1 IMPLANT
BINDER BREAST XLRG (GAUZE/BANDAGES/DRESSINGS) IMPLANT
BINDER BREAST XXLRG (GAUZE/BANDAGES/DRESSINGS) IMPLANT
BLADE SURG 15 STRL LF DISP TIS (BLADE) ×1 IMPLANT
BLADE SURG 15 STRL SS (BLADE) ×2
CANISTER SUC SOCK COL 7IN (MISCELLANEOUS) IMPLANT
CANISTER SUCT 1200ML W/VALVE (MISCELLANEOUS) IMPLANT
CHLORAPREP W/TINT 26 (MISCELLANEOUS) ×2 IMPLANT
CLIP APPLIE 9.375 MED OPEN (MISCELLANEOUS) IMPLANT
CLIP TI WIDE RED SMALL 6 (CLIP) ×1 IMPLANT
COVER BACK TABLE 60X90IN (DRAPES) ×2 IMPLANT
COVER MAYO STAND STRL (DRAPES) ×2 IMPLANT
COVER PROBE W GEL 5X96 (DRAPES) ×2 IMPLANT
DERMABOND ADVANCED (GAUZE/BANDAGES/DRESSINGS) ×1
DERMABOND ADVANCED .7 DNX12 (GAUZE/BANDAGES/DRESSINGS) ×1 IMPLANT
DRAPE LAPAROSCOPIC ABDOMINAL (DRAPES) ×2 IMPLANT
DRAPE UTILITY XL STRL (DRAPES) ×2 IMPLANT
DRSG TEGADERM 4X4.75 (GAUZE/BANDAGES/DRESSINGS) IMPLANT
ELECT COATED BLADE 2.86 ST (ELECTRODE) ×2 IMPLANT
ELECT REM PT RETURN 9FT ADLT (ELECTROSURGICAL) ×2
ELECTRODE REM PT RTRN 9FT ADLT (ELECTROSURGICAL) ×1 IMPLANT
GAUZE SPONGE 4X4 12PLY STRL LF (GAUZE/BANDAGES/DRESSINGS) IMPLANT
GLOVE SURG ENC MOIS LTX SZ7 (GLOVE) ×4 IMPLANT
GLOVE SURG POLYISO LF SZ7 (GLOVE) ×2 IMPLANT
GLOVE SURG UNDER POLY LF SZ6.5 (GLOVE) ×1 IMPLANT
GLOVE SURG UNDER POLY LF SZ7 (GLOVE) ×2 IMPLANT
GLOVE SURG UNDER POLY LF SZ7.5 (GLOVE) ×2 IMPLANT
GOWN STRL REUS W/ TWL LRG LVL3 (GOWN DISPOSABLE) ×2 IMPLANT
GOWN STRL REUS W/ TWL XL LVL3 (GOWN DISPOSABLE) IMPLANT
GOWN STRL REUS W/TWL LRG LVL3 (GOWN DISPOSABLE) ×4
GOWN STRL REUS W/TWL XL LVL3 (GOWN DISPOSABLE) ×2
HEMOSTAT ARISTA ABSORB 3G PWDR (HEMOSTASIS) IMPLANT
KIT MARKER MARGIN INK (KITS) ×2 IMPLANT
NDL HYPO 25X1 1.5 SAFETY (NEEDLE) ×1 IMPLANT
NEEDLE HYPO 25X1 1.5 SAFETY (NEEDLE) ×2 IMPLANT
NS IRRIG 1000ML POUR BTL (IV SOLUTION) ×1 IMPLANT
PACK BASIN DAY SURGERY FS (CUSTOM PROCEDURE TRAY) ×2 IMPLANT
PENCIL SMOKE EVACUATOR (MISCELLANEOUS) ×2 IMPLANT
RETRACTOR ONETRAX LX 90X20 (MISCELLANEOUS) IMPLANT
SLEEVE SCD COMPRESS KNEE MED (STOCKING) ×2 IMPLANT
SPIKE FLUID TRANSFER (MISCELLANEOUS) IMPLANT
SPONGE T-LAP 4X18 ~~LOC~~+RFID (SPONGE) ×2 IMPLANT
STRIP CLOSURE SKIN 1/2X4 (GAUZE/BANDAGES/DRESSINGS) ×2 IMPLANT
SUT MNCRL AB 4-0 PS2 18 (SUTURE) ×2 IMPLANT
SUT MON AB 5-0 PS2 18 (SUTURE) ×1 IMPLANT
SUT SILK 2 0 SH (SUTURE) ×2 IMPLANT
SUT VIC AB 2-0 SH 27 (SUTURE) ×4
SUT VIC AB 2-0 SH 27XBRD (SUTURE) ×1 IMPLANT
SUT VIC AB 3-0 SH 27 (SUTURE) ×2
SUT VIC AB 3-0 SH 27X BRD (SUTURE) ×1 IMPLANT
SUT VIC AB 5-0 PS2 18 (SUTURE) IMPLANT
SYR CONTROL 10ML LL (SYRINGE) ×2 IMPLANT
TOWEL GREEN STERILE FF (TOWEL DISPOSABLE) ×2 IMPLANT
TRAY FAXITRON CT DISP (TRAY / TRAY PROCEDURE) ×2 IMPLANT
TUBE CONNECTING 20X1/4 (TUBING) IMPLANT
YANKAUER SUCT BULB TIP NO VENT (SUCTIONS) IMPLANT

## 2021-04-13 NOTE — Interval H&P Note (Signed)
History and Physical Interval Note: ? ?04/13/2021 ?9:25 AM ? ?Sierra Bentley  has presented today for surgery, with the diagnosis of LEFT BREAST CANCER.  The various methods of treatment have been discussed with the patient and family. After consideration of risks, benefits and other options for treatment, the patient has consented to  Procedure(s): ?LEFT BREAST LUMPECTOMY WITH RADIOACTIVE SEED LOCALIZATION (Left) as a surgical intervention.  The patient's history has been reviewed, patient examined, no change in status, stable for surgery.  I have reviewed the patient's chart and labs.  Questions were answered to the patient's satisfaction.   ? ? ?Rolm Bookbinder ? ? ?

## 2021-04-13 NOTE — H&P (Signed)
?74 yof who is otherwise pretty healthy, lives alone and has a screening mm that shows a left breast mass at 12 oclock. No family history. No mass or dc noted. Prior history of cyst aspirations. She had mm that shows a 1.6x2.5 cm spiculated mass and on [place] this measures 3.5x1.4x2.2 cm. There is also an 8 mm mass that was biopsied and is benign (concordant). The 3.5 cm mass is a [place] IDC that is grade II and has a Ki of 5%. She is here to discuss options ? ?Review of Systems: ?A complete review of systems was obtained from the patient. I have reviewed this information and discussed as appropriate with the patient. See HPI as well for other ROS. ? ?Review of Systems  ?HENT: Positive for congestion, hearing loss and sore throat.  ?Eyes: Positive for blurred vision.  ?Respiratory: Positive for cough.  ?Neurological: Positive for headaches.  ? ? ?Medical History: ?Past Medical History:  ?Diagnosis Date  ? AMD (age-related macular degeneration), bilateral  ? Anemia  ? Dry eye  ? Exudative age-related macular degeneration, right eye, with active choroidal neovascularization (CMS-HCC) 09/04/2018  ? GERD (gastroesophageal reflux disease)  ? Hyperlipidemia  ? Hypertension  ? Pseudophakia of both eyes  ? Vision abnormalities  ? ?Patient Active Problem List  ?Diagnosis  ? Pseudophakia - Both Eyes  ? Fuchs' corneal dystrophy - Both Eyes  ? Nonexudative senile macular degeneration of retina - Both Eyes  ? Dry eye syndrome  ? Exudative age-related macular degeneration, right eye, with active choroidal neovascularization (CMS-HCC)  ? Malignant neoplasm of upper-outer quadrant of left breast in female, estrogen receptor negative (CMS-HCC)  ? Essential hypertension, benign  ? Hyperlipidemia  ? Gastroesophageal reflux disease  ? ?Past Surgical History:  ?Procedure Laterality Date  ? EXTRACTION CATARACT EXTRACAPSULAR W/INSERTION INTRAOCULAR PROSTHESIS Bilateral  ? REMOVAL SECONDARY MEMBRANOUS CATARACT Right  ?YAG caps  ? ? ?Allergies   ?Allergen Reactions  ? Boniva [Ibandronate] Other (See Comments)  ?Other Reaction: esophageal distress  ? Fosamax [Alendronate] Other (See Comments)  ?Other Reaction: esophageal distress  ? Oxycodone Other (See Comments)  ?Dizziness resulting in fall  ? Others Other (See Comments) and Headache  ?Uncoded Allergy. Allergen: Chocolate, Other Reaction: stuffy nose  ? Others Other (See Comments) and Headache  ?Uncoded Allergy. Allergen: Dust, Other Reaction: nasal allergies  ? Ace Inhibitors Cough  ? Cocoa Other (See Comments)  ?Severe headache and stopped up sinuses  ? Ibandronic Acid Other (See Comments)  ?Esophageal distress  ? ?Current Outpatient Medications on File Prior to Visit  ?Medication Sig Dispense Refill  ? benzonatate (TESSALON) 100 MG capsule TAKE 1 CAPSULE BY MOUTH THREE TIMES A DAY AS NEEDED FOR COUGH  ? latanoprost (XALATAN) 0.005 % ophthalmic solution Place 1 drop into both eyes nightly 7.5 mL 3  ? losartan (COZAAR) 25 MG tablet Take 25 mg by mouth Take 25 mg by Mouth Once a Day. (Patient not taking: No sig reported)  ? losartan-hydrochlorothiazide (HYZAAR) 50-12.5 mg tablet Take 1 tablet by mouth once daily.  ? losartan-hydrochlorothiazide (HYZAAR) 50-12.5 mg tablet Take 0.5 tablets by mouth once daily  ? mometasone (NASONEX) 50 mcg/actuation nasal spray  ? multivitamin (MULTIPLE VITAMINS ORAL) Take by mouth Not taken every day  ? multivitamin tablet Take by mouth  ? pantoprazole (PROTONIX) 40 MG DR tablet Take 40 mg by mouth once daily 3  ? pantoprazole (PROTONIX) 40 MG DR tablet Take 1 tablet by mouth once daily  ? peg 400-propylene glycol, PF, (  SYSTANE ULTRA) 0.4-0.3 % ophthalmic drops Place 1 drop into both eyes as needed for Dry Eyes.  ? sodium chloride (MURO 128) 5 % ophthalmic ointment Place 1 Application into both eyes nightly  ? ?No current facility-administered medications on file prior to visit.  ? ?Family History  ?Problem Relation Age of Onset  ? Coronary Artery Disease (Blocked arteries  around heart) Mother  ? High blood pressure (Hypertension) Mother  ? Cataracts Mother  ? Macular degeneration Mother  ? Coronary Artery Disease (Blocked arteries around heart) Father  ? Glaucoma Neg Hx  ? ? ?Social History  ? ?Tobacco Use  ?Smoking Status Never  ?Smokeless Tobacco Never  ? ? ?Social History  ? ?Socioeconomic History  ? Marital status: Married  ?Tobacco Use  ? Smoking status: Never  ? Smokeless tobacco: Never  ?Vaping Use  ? Vaping Use: Never used  ?Substance and Sexual Activity  ? Alcohol use: Not Currently  ? Drug use: Not Currently  ? Sexual activity: Defer  ?Social History Narrative  ?Married with two sons.  ?Pt used to teach keyboarding for typing.  ? ?Objective:  ?There were no vitals filed for this visit.  ?There is no height or weight on file to calculate BMI. ? ?Physical Exam ?Constitutional:  ?Appearance: Normal appearance.  ?Chest:  ?Breasts: ?Right: No inverted nipple, mass or nipple discharge.  ?Left: No inverted nipple, mass or nipple discharge.  ?Lymphadenopathy:  ?Upper Body:  ?Right upper body: No supraclavicular or axillary adenopathy.  ?Left upper body: No supraclavicular or axillary adenopathy.  ?Neurological:  ?Mental Status: She is alert.  ? ? ? ?Assessment and Plan:  ? ?Malignant neoplasm of upper-outer quadrant of left breast in female, estrogen receptor negative (CMS-HCC) ? ? ?Left breast seed guided lumpectomy ? ?We discussed the staging and pathophysiology of breast cancer. We discussed all of the different options for treatment for breast cancer including surgery, chemotherapy, radiation therapy, Herceptin, and antiestrogen therapy. ?This is a tn tumor (although interesting grade II and low Ki) but in discussion with med onc and rad onc a sn biopsy will not alter treatment so will omit and I discussed that with her.  ?We discussed the options for treatment of the breast cancer which included lumpectomy versus a mastectomy. We discussed the performance of the lumpectomy  with radioactive seed placement. We discussed a 5-10% chance of a positive margin requiring reexcision in the operating room. We also discussed that she will likely need radiation therapy if she undergoes lumpectomy. We discussed mastectomy and the postoperative care for that as well. Mastectomy can be followed by reconstruction. The decision for lumpectomy vs mastectomy has no impact on decision for chemotherapy. Most mastectomy patients will not need radiation therapy. We discussed that there is no difference in her survival whether she undergoes lumpectomy with radiation therapy or antiestrogen therapy versus a mastectomy. There is also no real difference between her recurrence in the breast. ?We discussed the risks of operation including bleeding, infection, possible reoperation. She understands her further therapy will be based on what her stages at the time of her operation.  ?

## 2021-04-13 NOTE — Anesthesia Preprocedure Evaluation (Signed)
Anesthesia Evaluation  ?Patient identified by MRN, date of birth, ID band ?Patient awake ? ? ? ?Reviewed: ?Allergy & Precautions, NPO status , Patient's Chart, lab work & pertinent test results ? ?History of Anesthesia Complications ?Negative for: history of anesthetic complications ? ?Airway ?Mallampati: II ? ?TM Distance: >3 FB ?Neck ROM: Full ? ? ? Dental ? ?(+) Dental Advisory Given ?  ?Pulmonary ?neg pulmonary ROS,  ?  ?Pulmonary exam normal ? ? ? ? ? ? ? Cardiovascular ?hypertension, Normal cardiovascular exam ? ? ?  ?Neuro/Psych ?negative neurological ROS ?   ? GI/Hepatic ?Neg liver ROS, GERD  ,  ?Endo/Other  ?negative endocrine ROS ? Renal/GU ?negative Renal ROS  ?negative genitourinary ?  ?Musculoskeletal ? ?(+) Arthritis , Rheumatoid disorders,   ? Abdominal ?  ?Peds ? Hematology ?negative hematology ROS ?(+)   ?Anesthesia Other Findings ? ?Left breast cancer ? Reproductive/Obstetrics ? ?  ? ? ? ? ? ? ? ? ? ? ? ? ? ?  ?  ? ? ? ? ? ? ? ?Anesthesia Physical ?Anesthesia Plan ? ?ASA: 2 ? ?Anesthesia Plan: General  ? ?Post-op Pain Management: Tylenol PO (pre-op)* and Toradol IV (intra-op)*  ? ?Induction: Intravenous ? ?PONV Risk Score and Plan: 3 and Ondansetron, Dexamethasone, Midazolam and Treatment may vary due to age or medical condition ? ?Airway Management Planned: LMA ? ?Additional Equipment: None ? ?Intra-op Plan:  ? ?Post-operative Plan: Extubation in OR ? ?Informed Consent: I have reviewed the patients History and Physical, chart, labs and discussed the procedure including the risks, benefits and alternatives for the proposed anesthesia with the patient or authorized representative who has indicated his/her understanding and acceptance.  ? ? ? ?Dental advisory given ? ?Plan Discussed with:  ? ?Anesthesia Plan Comments:   ? ? ? ? ? ? ?Anesthesia Quick Evaluation ? ?

## 2021-04-13 NOTE — Discharge Instructions (Addendum)
[facility] ?Office Phone Number [phone] ? POST OP INSTRUCTIONS ?Take 400 mg of ibuprofen every 8 hours or 650 mg tylenol every 6 hours for next 72 hours then as needed. Use ice several times daily also. ?Always review your discharge instruction sheet given to you by the facility where your surgery was performed. ? ? ? ?A prescription for pain medication may be given to you upon discharge.  Take your pain medication as prescribed, if needed.  If narcotic pain medicine is not needed, then you may take acetaminophen (Tylenol), naprosyn (Alleve) or ibuprofen (Advil) as needed. Next dose of Tylenol after 2:20pm as needed for pain. ? ?Take your usually prescribed medications unless otherwise directed ?If you need a refill on your pain medication, please contact your pharmacy.  They will contact our office to request authorization.  Prescriptions will not be filled after 5pm or on week-ends. ?You should eat very light the first 24 hours after surgery, such as soup, crackers, pudding, etc.  Resume your normal diet the day after surgery. ?Most patients will experience some swelling and bruising in the breast.  Ice packs and a good support bra will help.  Wear the breast binder provided or a sports bra for 72 hours day and night.  After that wear a sports bra during the day until you return to the office. Swelling and bruising can take several days to resolve.  ?It is common to experience some constipation if taking pain medication after surgery.  Increasing fluid intake and taking a stool softener will usually help or prevent this problem from occurring.  A mild laxative (Milk of Magnesia or Miralax) should be taken according to package directions if there are no bowel movements after 48 hours. ?I used skin glue on the incision, you may shower in 24 hours.  The glue will flake off over the next 2-3 weeks.  Any sutures or staples will be removed at the office during your follow-up visit. ?ACTIVITIES:   You may resume regular daily activities (gradually increasing) beginning the next day.  Wearing a good support bra or sports bra minimizes pain and swelling.   ?You may drive when you no longer are taking prescription pain medication, you can comfortably wear a seatbelt, and you can safely maneuver your car and apply brakes. ?You should see your doctor in the office for a follow-up appointment approximately two weeks after your surgery.  Your doctor?s nurse will typically make your follow-up appointment when she calls you with your pathology report.  Expect your pathology report 3-4 business days after your surgery.  You may call to check if you do not hear from [place] after three days. ?OTHER INSTRUCTIONS: _______________________________________________________________________________________________ _____________________________________________________________________________________________________________________________________ ?_____________________________________________________________________________________________________________________________________ ?_____________________________________________________________________________________________________________________________________ ? ?WHEN TO CALL DR WAKEFIELD: ?Fever over 101.0 ?Nausea and/or vomiting. ?Extreme swelling or bruising. ?Continued bleeding from incision. ?Increased pain, redness, or drainage from the incision. ? ?The clinic staff is available to answer your questions during regular business hours.  Please don?t hesitate to call and ask to speak to one of the nurses for clinical concerns.  If you have a medical emergency, go to the nearest emergency room or call 911.  A surgeon from [facility] is always on call at the hospital. ? ?For further questions, please visit [email or web address] ? ? ?Post Anesthesia Home Care Instructions ? ?Activity: ?Get plenty of rest for the remainder of the day. A responsible individual  must stay with you for 24 hours following the procedure.  ?For the next 24  hours, DO NOT: ?-Drive a car ?-Operate machinery ?-Drink alcoholic beverages ?-Take any medication unless instructed by your physician ?-Make any legal decisions or sign important papers. ? ?Meals: ?Start with liquid foods such as gelatin or soup. Progress to regular foods as tolerated. Avoid greasy, spicy, heavy foods. If nausea and/or vomiting occur, drink only clear liquids until the nausea and/or vomiting subsides. Call your physician if vomiting continues. ? ?Special Instructions/Symptoms: ?Your throat may feel dry or sore from the anesthesia or the breathing tube placed in your throat during surgery. If this causes discomfort, gargle with warm salt water. The discomfort should disappear within 24 hours. ? ?If you had a scopolamine patch placed behind your ear for the management of post- operative nausea and/or vomiting: ? ?1. The medication in the patch is effective for 72 hours, after which it should be removed.  Wrap patch in a tissue and discard in the trash. Wash hands thoroughly with soap and water. ?2. You may remove the patch earlier than 72 hours if you experience unpleasant side effects which may include dry mouth, dizziness or visual disturbances. ?3. Avoid touching the patch. Wash your hands with soap and water after contact with the patch. ?    ? ?

## 2021-04-13 NOTE — Transfer of Care (Signed)
Immediate Anesthesia Transfer of Care Note ? ?Patient: Sierra Bentley ? ?Procedure(s) Performed: Procedure(s) (LRB): ?LEFT BREAST LUMPECTOMY WITH RADIOACTIVE SEED LOCALIZATION (Left) ? ?Patient Location: PACU ? ?Anesthesia Type: MAC ? ?Level of Consciousness: awake, alert , oriented and patient cooperative ? ?Airway & Oxygen Therapy: Patient Spontanous Breathing and Patient connected to face mask oxygen ? ?Post-op Assessment: Report given to PACU RN and Post -op Vital signs reviewed and stable ? ?Post vital signs: Reviewed and stable ? ?Complications: No apparent anesthesia complications ?Last Vitals:  ?Vitals Value Taken Time  ?BP 130/66 04/13/21 1110  ?Temp 36.4 ?C 04/13/21 1110  ?Pulse 79 04/13/21 1115  ?Resp 16 04/13/21 1115  ?SpO2 100 % 04/13/21 1115  ?Vitals shown include unvalidated device data. ? ?Last Pain:  ?Vitals:  ? 04/13/21 1110  ?TempSrc:   ?PainSc: Asleep  ?   ? ?Patients Stated Pain Goal: 3 (04/13/21 9355) ? ?Complications: No notable events documented. ?

## 2021-04-13 NOTE — Anesthesia Postprocedure Evaluation (Signed)
Anesthesia Post Note ? ?Patient: Sierra Bentley ? ?Procedure(s) Performed: LEFT BREAST LUMPECTOMY WITH RADIOACTIVE SEED LOCALIZATION (Left: Breast) ? ?  ? ?Patient location during evaluation: PACU ?Anesthesia Type: General ?Level of consciousness: awake and alert ?Pain management: pain level controlled ?Vital Signs Assessment: post-procedure vital signs reviewed and stable ?Respiratory status: spontaneous breathing, nonlabored ventilation and respiratory function stable ?Cardiovascular status: blood pressure returned to baseline and stable ?Postop Assessment: no apparent nausea or vomiting ?Anesthetic complications: no ? ? ?No notable events documented. ? ?Last Vitals:  ?Vitals:  ? 04/13/21 1215 04/13/21 1230  ?BP: (!) 116/59 121/64  ?Pulse: 74 71  ?Resp: 12 13  ?Temp:    ?SpO2: 100% 95%  ?  ?Last Pain:  ?Vitals:  ? 04/13/21 1255  ?TempSrc:   ?PainSc: 0-No pain  ? ? ?  ?  ?  ?  ?  ?  ? ?Lidia Collum ? ? ? ? ?

## 2021-04-13 NOTE — Op Note (Signed)
Preoperative diagnosis: Clinical stage II left breast cancer ?Postoperative diagnosis: Same as above ?Procedure  Left breast radioactive seed guided lumpectomy ?Surgeon: Dr. Serita Grammes ?Anesthesia: General ?Estimated blood loss: Minimal ?Complications: None ?Drains: None ?Specimens: ?1.  Left breast lumpectomy containing seed and clip ?2.  Additional lateral,superior,  anterior, medial and inferior margins marked short superior, long lateral, double deep ?Sponge needle count was correct at completion ?Decision to recovery stable condition ? ?Indications: 43 yof who is otherwise pretty healthy, lives alone and has a screening mm that shows a left breast mass at 12 oclock. No family history. No mass or dc noted. Prior history of cyst aspirations. She had mm that shows a 1.6x2.5 cm spiculated mass and on [place] this measures 3.5x1.4x2.2 cm. There is also an 8 mm mass that was biopsied and is benign (concordant). The 3.5 cm mass is a [place] IDC that is grade II and has a Ki of 5%. She is here to discuss options and we elected to proceed with lumpectomy.  ? ?Procedure: She first had a seed placed by radiology.  I had these mammograms available for my review.  After informed consent was obtained I took her to OR.   I also prepped the area lateral to the areola.  I injected 2 cc of mag trace in the subareolar position without complication.  She was given antibiotics.  SCDs were placed.  She was then placed under general anesthesia without complication.  She was prepped and draped in the standard sterile surgical fashion.  A surgical timeout was then performed. ? ?I made a periareolar incision after injecting marcaine. I then dissected to the seed/ I removed the seed and the surrounding tissue with an attempt to get a clear margin. Mammogram confirmed removal of the seed and the clip.  I viewed the images and thought I might be close to several margins so I removed these and passed them off the table.  Hemostasis was observed.  I then closed the breast tissue with 2-0 vicryl, 3-0 vicryl and 5-0 monocryl.  Steirstrips and glue were applied.  She tolerated well and was transferred to pacu stable.  ?

## 2021-04-13 NOTE — Anesthesia Procedure Notes (Signed)
Procedure Name: LMA Insertion ?Date/Time: 04/13/2021 9:58 AM ?Performed by: Glory Buff, CRNA ?Pre-anesthesia Checklist: Patient identified, Emergency Drugs available, Suction available and Patient being monitored ?Patient Re-evaluated:Patient Re-evaluated prior to induction ?Oxygen Delivery Method: Circle system utilized ?Preoxygenation: Pre-oxygenation with 100% oxygen ?Induction Type: IV induction ?LMA: LMA inserted ?LMA Size: 3.0 ?Number of attempts: 1 ?Placement Confirmation: positive ETCO2 ?Tube secured with: Tape ?Dental Injury: Teeth and Oropharynx as per pre-operative assessment  ? ? ? ? ?

## 2021-04-15 ENCOUNTER — Encounter (HOSPITAL_BASED_OUTPATIENT_CLINIC_OR_DEPARTMENT_OTHER): Payer: Self-pay | Admitting: General Surgery

## 2021-04-16 LAB — SURGICAL PATHOLOGY

## 2021-04-21 ENCOUNTER — Encounter: Payer: Self-pay | Admitting: *Deleted

## 2021-04-21 NOTE — Assessment & Plan Note (Addendum)
04/13/2021:Left lumpectomy: Grade 2 invasive carcinoma with apocrine features, DCIS grade 2 with necrosis and microcalcifications, margins negative, ER 0%, PR 0%, HER2 negative, Ki-67 5% (lymph nodes not sampled) ? ?Pathology counseling: I discussed the final pathology report of the patient provided  a copy of this report. I discussed the margins as well as lymph node surgeries. We also discussed the final staging along with previously performed ER/PR and HER-2/neu testing. ? ?Treatment plan: Adjuvant radiation followed by surveillance ?We will set up a survivorship care plan appointment in 3 months. ?After that patient can be followed with long-term survivorship with Mendel Ryder. ?

## 2021-04-21 NOTE — Progress Notes (Signed)
? ?Patient Care Team: ?Tower, [name], MD as PCP - General ?Beverly Gust, MD as Referring Physician (Otolaryngology) ?Shirlee More, MD as Referring Physician (Ophthalmology) ?Rockwell Germany, RN as Oncology Nurse Navigator ?Mauro Kaufmann, RN as Oncology Nurse Navigator ?Rolm Bookbinder, MD as Consulting Physician (General Surgery) ?Nicholas Lose, MD as Consulting Physician (Hematology and Oncology) ?Gery Pray, MD as Consulting Physician (Radiation Oncology) ? ?DIAGNOSIS:  ?Encounter Diagnosis  ?Name Primary?  ? Malignant neoplasm of upper-outer quadrant of left breast in female, estrogen receptor negative ([place])   ? ? ?SUMMARY OF ONCOLOGIC HISTORY: ?Oncology History  ?Malignant neoplasm of upper-outer quadrant of left breast in female, estrogen receptor negative ([place])  ?03/10/2021 Initial Diagnosis  ? Screening mammogram detected left breast mass at 12 o'clock position: 1.6 cm, by ultrasound retroareolar mass 3.5 x 1.4 x 2.2 cm, additional mass 0.8 cm: Benign on biopsy, axilla negative ?  ?03/18/2021 Cancer Staging  ? Staging form: Breast, AJCC 8th Edition ?- Clinical stage from 03/18/2021: Stage IIB (cT2, cN0, cM0, G2, ER-, PR-, HER2-) - Signed by Nicholas Lose, MD on 03/18/2021 ?Stage prefix: Initial diagnosis ?Histologic grading system: 3 grade system ? ?  ?04/13/2021 Surgery  ? Left lumpectomy: Grade 2 invasive carcinoma with apocrine features, DCIS grade 2 with necrosis and microcalcifications, margins negative, ER 0%, PR 0%, HER2 negative, Ki-67 5% (lymph nodes not sampled) ?  ? ? ?CHIEF COMPLIANT: Review pathology for recent surgery. ? ?INTERVAL HISTORY: Sierra Bentley is a  88 y.o. female is here because of recent diagnosis of invasive ductal carcinoma of the left breast. She complained of  dizziness and nausea. She denies pain.  ? ? ?ALLERGIES:  is allergic to ace inhibitors, boniva [ibandronic acid], chocolate, fosamax [alendronate], and oxycodone. ? ?MEDICATIONS:  ?Current Outpatient  Medications  ?Medication Sig Dispense Refill  ? latanoprost (XALATAN) 0.005 % ophthalmic solution 1 drop at bedtime.    ? losartan-hydrochlorothiazide (HYZAAR) 50-12.5 MG tablet Take 0.5 tablets by mouth every evening. 45 tablet 3  ? Multiple Vitamin (MULTIVITAMIN) tablet Take 1 tablet by mouth daily. Per pt has Postmenopausal multivitamin packet with 5 pills    ? pantoprazole (PROTONIX) 40 MG tablet Take 1 tablet (40 mg total) by mouth daily as needed. Per pt when needed takes in the evening 90 tablet 3  ? Polyethyl Glycol-Propyl Glycol (SYSTANE ULTRA OP) Place 1 drop into both eyes 4 (four) times daily.      ? Probiotic Product (ALIGN EXTRA STRENGTH PO) Take by mouth.    ? sodium chloride (MURO 128) 5 % ophthalmic ointment Place 1 application into both eyes at bedtime.     ? traMADol (ULTRAM) 50 MG tablet Take 1 tablet (50 mg total) by mouth every 6 (six) hours as needed. 6 tablet 0  ? TURMERIC PO Take 1,300 mg by mouth in the morning and at bedtime.    ? ?No current facility-administered medications for this visit.  ? ? ?PHYSICAL EXAMINATION: ?ECOG PERFORMANCE STATUS: 1 - Symptomatic but completely ambulatory ? ?Vitals:  ? 04/22/21 0846  ?BP: (!) 135/53  ?Pulse: 76  ?Resp: 17  ?Temp: (!) 97.5 ?F (36.4 ?C)  ?SpO2: 99%  ? ?Filed Weights  ? 04/22/21 0846  ?Weight: 109 lb 9.6 oz (49.7 kg)  ? ?  ? ?LABORATORY DATA:  ?I have reviewed the data as listed ? ?  Latest Ref Rng & Units 04/03/2021  ?  9:57 AM 03/18/2021  ? 12:13 PM 11/24/2020  ?  1:21 PM  ?CMP  ?  Glucose 70 - 99 mg/dL 86   141   89    ?BUN 8 - 23 mg/dL 19   16   19     ?Creatinine 0.44 - 1.00 mg/dL 0.65   0.66   0.63    ?Sodium 135 - 145 mmol/L 139   140   139    ?Potassium 3.5 - 5.1 mmol/L 4.3   3.7   4.2    ?Chloride 98 - 111 mmol/L 103   103   104    ?CO2 22 - 32 mmol/L 28   34   29    ?Calcium 8.9 - 10.3 mg/dL 9.4   9.5   9.2    ?Total Protein 6.5 - 8.1 g/dL  6.4     ?Total Bilirubin 0.3 - 1.2 mg/dL  0.5     ?Alkaline Phos 38 - 126 U/L  43     ?AST 15 - 41  U/L  20     ?ALT 0 - 44 U/L  20     ? ? ?Lab Results  ?Component Value Date  ? WBC 5.3 03/18/2021  ? HGB 12.1 03/18/2021  ? HCT 37.7 03/18/2021  ? MCV 87.1 03/18/2021  ? PLT 244 03/18/2021  ? NEUTROABS 3.8 03/18/2021  ? ? ?ASSESSMENT & PLAN:  ?Malignant neoplasm of upper-outer quadrant of left breast in female, estrogen receptor negative ([place]) ?04/13/2021:Left lumpectomy: Grade 2 invasive carcinoma with apocrine features, DCIS grade 2 with necrosis and microcalcifications, margins negative, ER 0%, PR 0%, HER2 negative, Ki-67 5% (lymph nodes not sampled) ? ?Pathology counseling: I discussed the final pathology report of the patient provided  a copy of this report. I discussed the margins as well as lymph node surgeries. We also discussed the final staging along with previously performed ER/PR and HER-2/neu testing. ? ?Treatment plan: Adjuvant radiation followed by surveillance ?We will set up a survivorship care plan appointment in 3 months. ?After that patient can be followed with long-term survivorship with Mendel Ryder. ? ? ? ?No orders of the defined types were placed in this encounter. ? ?The patient has a good understanding of the overall plan. she agrees with it. she will call with any problems that may develop before the next visit here. ?Total time spent: 30 mins including face to face time and time spent for planning, charting and co-ordination of care ? ? Harriette Ohara, MD ?04/22/21 ? ?I Gardiner Coins am scribing for Dr. Lindi Adie ? ?I have reviewed the above documentation for accuracy and completeness, and I agree with the above. ?  ?  ?

## 2021-04-22 ENCOUNTER — Other Ambulatory Visit: Payer: Self-pay

## 2021-04-22 ENCOUNTER — Inpatient Hospital Stay: Payer: Medicare PPO | Attending: Hematology and Oncology | Admitting: Hematology and Oncology

## 2021-04-22 DIAGNOSIS — Z79899 Other long term (current) drug therapy: Secondary | ICD-10-CM | POA: Insufficient documentation

## 2021-04-22 DIAGNOSIS — Z171 Estrogen receptor negative status [ER-]: Secondary | ICD-10-CM | POA: Insufficient documentation

## 2021-04-22 DIAGNOSIS — C50412 Malignant neoplasm of upper-outer quadrant of left female breast: Secondary | ICD-10-CM | POA: Insufficient documentation

## 2021-04-29 ENCOUNTER — Encounter (HOSPITAL_COMMUNITY): Payer: Self-pay

## 2021-05-05 NOTE — Progress Notes (Signed)
Location of Breast Cancer: Left Breast UOQ ? ?Histology per Pathology Report:  Invasive Ductal Carcinoma ? ?Receptor Status:  ?           Estrogen Receptor: 0%, Negative  ?           Progesterone Receptor: 0%, Negative  ?           HER2: Negative, Group 5  ?           Ki-67: 5%  ? ?Did patient present with symptoms (if so, please note symptoms) or was this found on screening mammography?: Screening mammogram detected left breast mass at 12 o'clock position ? ?Past/Anticipated interventions by surgeon, if any:  ?Procedure  Left breast radioactive seed guided lumpectomy ?Surgeon: Dr. Serita Grammes ? ?Past/Anticipated interventions by medical oncology, if any:  Dr Lindi Adie ?1.  Breast conserving surgery ?2. adjuvant radiation ?  ?Being triple negative disease there is no role of antiestrogen therapy. ?Given her age she is not a good candidate for systemic chemotherapy. ? ?Lymphedema issues, if any:  no   ? ?Pain issues, if any:  none  ? ?SAFETY ISSUES: ?Prior radiation? no ?Pacemaker/ICD? no ?Possible current pregnancy?no, postmenopausal ?Is the patient on methotrexate? no ? ?Current Complaints / other details:  nothing of note ?   ?Vitals:  ? 05/11/21 1233  ?BP: 136/67  ?Pulse: 70  ?Resp: 18  ?Temp: (!) 96.8 ?F (36 ?C)  ?TempSrc: Temporal  ?SpO2: 100%  ?Weight: 109 lb 4 oz (49.6 kg)  ?Height: 5' 3.75" (1.619 m)  ? ? ? ? ?

## 2021-05-10 NOTE — Progress Notes (Signed)
?Radiation Oncology         [phone] ?________________________________ ? ?Name: Sierra Bentley MRN: [id number]  ?Date: 05/11/2021  DOB: 1933/08/08 ? ?Re-Evaluation Note ? ?CC: Tower, [name], MD  Nicholas Lose, MD ? ?  ICD-10-CM   ?1. Malignant neoplasm of upper-outer quadrant of left breast in female, estrogen receptor negative ([place])  C50.412   ? Z17.1   ?  ? ? ?Diagnosis:  S/p lumpectomy: Stage IIB (cT2, cN0, cM0) Left Breast UOQ, Invasive ductal carcinoma with intermediate grade DCIS, ER- / PR- / Her2-, Grade 2 ? ?Narrative:  The patient returns today to discuss radiation treatment options. She was seen in the multidisciplinary breast clinic on 03/18/21. ? ?She opted to proceed with left breast lumpectomy without nodal biopsies on 04/13/21 under the care of Dr. Donne Hazel. Pathology from the procedure revealed: grade 2 invasive ductal carcinoma measuring 2.2 cm in the greatest dimension, with intermediate grade ductal carcinoma in-situ with central necrosis and microcalcifications. All margins negative for carcinoma. Margin status to invasive disease of 0.5 mm from the inferior margin and 0.6 mm from the medial margin. Margin status to in-situ disease of 0.5 mm from the medial margin and 0.8 mm from the inferior margin. Prognostic indicators significant for: estrogen receptor 0% negative; progesterone receptor 0% negative; Proliferation marker Ki67 at 5%; Her2 status negative; Grade 2.  ? ?Per her most recent post-op follow up with Dr. Donne Hazel on 04/29/21, the patient was noted to be healing well. No evidence of infection or seroma was appreciated on physical exam.  ?  ?Given her triple negative disease, there is no role of antiestrogen therapy of chemotherapy. The patient will return to Dr. Lindi Adie in 3 months to review her survivorship care plan. ? ?On review of systems, the patient reports doing well since her surgery she has some mild fatigue.  She denies any significant discomfort within the breast  nipple discharge or bleeding.  She denies any swelling in her left arm or hand..  ? ? ?Allergies:  is allergic to ace inhibitors, boniva [ibandronic acid], chocolate, fosamax [alendronate], and oxycodone. ? ?Meds: ?Current Outpatient Medications  ?Medication Sig Dispense Refill  ? latanoprost (XALATAN) 0.005 % ophthalmic solution 1 drop at bedtime.    ? losartan-hydrochlorothiazide (HYZAAR) 50-12.5 MG tablet Take 0.5 tablets by mouth every evening. 45 tablet 3  ? Multiple Vitamin (MULTIVITAMIN) tablet Take 1 tablet by mouth daily. Per pt has Postmenopausal multivitamin packet with 5 pills    ? pantoprazole (PROTONIX) 40 MG tablet Take 1 tablet (40 mg total) by mouth daily as needed. Per pt when needed takes in the evening 90 tablet 3  ? Probiotic Product (ALIGN EXTRA STRENGTH PO) Take by mouth.    ? sodium chloride (MURO 128) 5 % ophthalmic ointment Place 1 application into both eyes at bedtime.     ? TURMERIC PO Take 1,300 mg by mouth in the morning and at bedtime.    ? Polyethyl Glycol-Propyl Glycol (SYSTANE ULTRA OP) Place 1 drop into both eyes 4 (four) times daily.   (Patient not taking: Reported on 05/11/2021)    ? traMADol (ULTRAM) 50 MG tablet Take 1 tablet (50 mg total) by mouth every 6 (six) hours as needed. (Patient not taking: Reported on 05/11/2021) 6 tablet 0  ? ?No current facility-administered medications for this encounter.  ? ? ?Physical Findings: ?The patient is in no acute distress. Patient is alert and oriented. ? height is 5' 3.75" (1.619 m) and weight is 109 lb  4 oz (49.6 kg). Her temporal temperature is 96.8 ?F (36 ?C) (abnormal). Her blood pressure is 136/67 and her pulse is 70. Her respiration is 18 and oxygen saturation is 100%.  Lungs are clear to auscultation bilaterally. Heart has regular rate and rhythm. No palpable cervical, supraclavicular, or axillary adenopathy. Abdomen soft, non-tender, normal bowel sounds. ?Right breast: no palpable mass, nipple discharge or bleeding. ?Left breast:  Well-healing periareolar scar.  No signs of drainage or infection.  No dominant mass appreciated in the breast.  No nipple discharge or bleeding. ? ?Lab Findings: ?Lab Results  ?Component Value Date  ? WBC 5.3 03/18/2021  ? HGB 12.1 03/18/2021  ? HCT 37.7 03/18/2021  ? MCV 87.1 03/18/2021  ? PLT 244 03/18/2021  ? ? ?Radiographic Findings: ?No results found. ? ?Impression: S/p lumpectomy: Stage IIB (pT2, cN0, cM0) Left Breast UOQ, Invasive ductal carcinoma with intermediate grade DCIS, ER- / PR- / Her2-, Grade 2 ? ?She has recovered well from her surgery and is now ready to proceed with radiation therapy as breast conserving treatment.  I would recommend adjuvant radiation therapy as part of her overall management despite her age of 82.  She does have a triple negative aggressive breast cancer as above and would not have the benefit from adjuvant hormonal therapy. ? ?Plan:  Patient is scheduled for CT simulation later today.  Treatments to begin in approximately a week.  She would be a good candidate for hypofractionated accelerated radiation therapy over approximately 3 weeks.  Given the almost 1 cm margins on her pathologic specimen would not recommend a boost to the lumpectomy cavity in this situation.  We will use cardiac sparing techniques if necessary. ? ?----------------------------------- ? ?Blair Promise, PhD, MD ? ?This document serves as a record of services personally performed by Gery Pray, MD. It was created on his behalf by Roney Mans, a trained medical scribe. The creation of this record is based on the scribe's personal observations and the provider's statements to them. This document has been checked and approved by the attending provider. ? ?

## 2021-05-11 ENCOUNTER — Encounter: Payer: Self-pay | Admitting: Radiation Oncology

## 2021-05-11 ENCOUNTER — Other Ambulatory Visit: Payer: Self-pay

## 2021-05-11 ENCOUNTER — Ambulatory Visit
Admission: RE | Admit: 2021-05-11 | Discharge: 2021-05-11 | Disposition: A | Discharge status: Home / Self Care | Payer: Medicare PPO | Source: Ambulatory Visit | Attending: Radiation Oncology | Admitting: Radiation Oncology

## 2021-05-11 VITALS — BP 136/67 | HR 70 | Temp 96.8°F | Resp 18 | Ht 63.75 in | Wt 109.2 lb

## 2021-05-11 DIAGNOSIS — R5383 Other fatigue: Secondary | ICD-10-CM | POA: Diagnosis not present

## 2021-05-11 DIAGNOSIS — Z79899 Other long term (current) drug therapy: Secondary | ICD-10-CM | POA: Diagnosis not present

## 2021-05-11 DIAGNOSIS — Z171 Estrogen receptor negative status [ER-]: Secondary | ICD-10-CM | POA: Insufficient documentation

## 2021-05-11 DIAGNOSIS — C50412 Malignant neoplasm of upper-outer quadrant of left female breast: Secondary | ICD-10-CM

## 2021-05-11 DIAGNOSIS — Z510 Encounter for antineoplastic radiation therapy: Secondary | ICD-10-CM | POA: Diagnosis not present

## 2021-05-11 NOTE — Progress Notes (Signed)
See MD note for nursing evaluation. °

## 2021-05-13 DIAGNOSIS — C50412 Malignant neoplasm of upper-outer quadrant of left female breast: Secondary | ICD-10-CM | POA: Diagnosis not present

## 2021-05-13 DIAGNOSIS — Z171 Estrogen receptor negative status [ER-]: Secondary | ICD-10-CM | POA: Diagnosis not present

## 2021-05-13 DIAGNOSIS — Z510 Encounter for antineoplastic radiation therapy: Secondary | ICD-10-CM | POA: Diagnosis not present

## 2021-05-14 ENCOUNTER — Encounter: Payer: Self-pay | Admitting: *Deleted

## 2021-05-15 DIAGNOSIS — H353231 Exudative age-related macular degeneration, bilateral, with active choroidal neovascularization: Secondary | ICD-10-CM | POA: Diagnosis not present

## 2021-05-15 DIAGNOSIS — H353123 Nonexudative age-related macular degeneration, left eye, advanced atrophic without subfoveal involvement: Secondary | ICD-10-CM | POA: Diagnosis not present

## 2021-05-15 DIAGNOSIS — H18513 Endothelial corneal dystrophy, bilateral: Secondary | ICD-10-CM | POA: Diagnosis not present

## 2021-05-15 DIAGNOSIS — Z961 Presence of intraocular lens: Secondary | ICD-10-CM | POA: Diagnosis not present

## 2021-05-15 DIAGNOSIS — H04129 Dry eye syndrome of unspecified lacrimal gland: Secondary | ICD-10-CM | POA: Diagnosis not present

## 2021-05-20 ENCOUNTER — Ambulatory Visit
Admission: RE | Admit: 2021-05-20 | Discharge: 2021-05-20 | Disposition: A | Discharge status: Home / Self Care | Payer: Medicare PPO | Source: Ambulatory Visit | Attending: Radiation Oncology | Admitting: Radiation Oncology

## 2021-05-20 ENCOUNTER — Other Ambulatory Visit: Payer: Self-pay

## 2021-05-20 DIAGNOSIS — C50412 Malignant neoplasm of upper-outer quadrant of left female breast: Secondary | ICD-10-CM | POA: Diagnosis not present

## 2021-05-20 DIAGNOSIS — Z171 Estrogen receptor negative status [ER-]: Secondary | ICD-10-CM | POA: Diagnosis not present

## 2021-05-20 DIAGNOSIS — Z510 Encounter for antineoplastic radiation therapy: Secondary | ICD-10-CM | POA: Diagnosis not present

## 2021-05-20 LAB — RAD ONC ARIA SESSION SUMMARY
Course Elapsed Days: 0
Plan Fractions Treated to Date: 1
Plan Prescribed Dose Per Fraction: 2.67 Gy
Plan Total Fractions Prescribed: 16
Plan Total Prescribed Dose: 42.72 Gy
Reference Point Dosage Given to Date: 2.67 Gy
Reference Point Session Dosage Given: 2.67 Gy
Session Number: 1

## 2021-05-21 ENCOUNTER — Ambulatory Visit
Admission: RE | Admit: 2021-05-21 | Discharge: 2021-05-21 | Disposition: A | Discharge status: Home / Self Care | Payer: Medicare PPO | Source: Ambulatory Visit | Attending: Radiation Oncology | Admitting: Radiation Oncology

## 2021-05-21 ENCOUNTER — Other Ambulatory Visit: Payer: Self-pay

## 2021-05-21 DIAGNOSIS — Z510 Encounter for antineoplastic radiation therapy: Secondary | ICD-10-CM | POA: Diagnosis not present

## 2021-05-21 DIAGNOSIS — Z171 Estrogen receptor negative status [ER-]: Secondary | ICD-10-CM | POA: Diagnosis not present

## 2021-05-21 DIAGNOSIS — C50412 Malignant neoplasm of upper-outer quadrant of left female breast: Secondary | ICD-10-CM | POA: Diagnosis not present

## 2021-05-21 LAB — RAD ONC ARIA SESSION SUMMARY
Course Elapsed Days: 1
Plan Fractions Treated to Date: 2
Plan Prescribed Dose Per Fraction: 2.67 Gy
Plan Total Fractions Prescribed: 16
Plan Total Prescribed Dose: 42.72 Gy
Reference Point Dosage Given to Date: 5.34 Gy
Reference Point Session Dosage Given: 2.67 Gy
Session Number: 2

## 2021-05-22 ENCOUNTER — Ambulatory Visit
Admission: RE | Admit: 2021-05-22 | Discharge: 2021-05-22 | Disposition: A | Discharge status: Home / Self Care | Payer: Medicare PPO | Source: Ambulatory Visit | Attending: Radiation Oncology | Admitting: Radiation Oncology

## 2021-05-22 ENCOUNTER — Other Ambulatory Visit: Payer: Self-pay

## 2021-05-22 DIAGNOSIS — Z510 Encounter for antineoplastic radiation therapy: Secondary | ICD-10-CM | POA: Diagnosis not present

## 2021-05-22 DIAGNOSIS — Z171 Estrogen receptor negative status [ER-]: Secondary | ICD-10-CM | POA: Diagnosis not present

## 2021-05-22 DIAGNOSIS — C50412 Malignant neoplasm of upper-outer quadrant of left female breast: Secondary | ICD-10-CM | POA: Diagnosis not present

## 2021-05-22 LAB — RAD ONC ARIA SESSION SUMMARY
Course Elapsed Days: 2
Plan Fractions Treated to Date: 3
Plan Prescribed Dose Per Fraction: 2.67 Gy
Plan Total Fractions Prescribed: 16
Plan Total Prescribed Dose: 42.72 Gy
Reference Point Dosage Given to Date: 8.01 Gy
Reference Point Session Dosage Given: 2.67 Gy
Session Number: 3

## 2021-05-25 ENCOUNTER — Other Ambulatory Visit: Payer: Self-pay

## 2021-05-25 ENCOUNTER — Ambulatory Visit
Admission: RE | Admit: 2021-05-25 | Discharge: 2021-05-25 | Disposition: A | Discharge status: Home / Self Care | Payer: Medicare PPO | Source: Ambulatory Visit | Attending: Radiation Oncology | Admitting: Radiation Oncology

## 2021-05-25 DIAGNOSIS — C50412 Malignant neoplasm of upper-outer quadrant of left female breast: Secondary | ICD-10-CM | POA: Diagnosis not present

## 2021-05-25 DIAGNOSIS — Z510 Encounter for antineoplastic radiation therapy: Secondary | ICD-10-CM | POA: Diagnosis not present

## 2021-05-25 DIAGNOSIS — Z171 Estrogen receptor negative status [ER-]: Secondary | ICD-10-CM | POA: Insufficient documentation

## 2021-05-25 LAB — RAD ONC ARIA SESSION SUMMARY
Course Elapsed Days: 5
Plan Fractions Treated to Date: 4
Plan Prescribed Dose Per Fraction: 2.67 Gy
Plan Total Fractions Prescribed: 16
Plan Total Prescribed Dose: 42.72 Gy
Reference Point Dosage Given to Date: 10.68 Gy
Reference Point Session Dosage Given: 2.67 Gy
Session Number: 4

## 2021-05-26 ENCOUNTER — Other Ambulatory Visit: Payer: Self-pay

## 2021-05-26 ENCOUNTER — Ambulatory Visit
Admission: RE | Admit: 2021-05-26 | Discharge: 2021-05-26 | Disposition: A | Discharge status: Home / Self Care | Payer: Medicare PPO | Source: Ambulatory Visit | Attending: Radiation Oncology | Admitting: Radiation Oncology

## 2021-05-26 DIAGNOSIS — C50412 Malignant neoplasm of upper-outer quadrant of left female breast: Secondary | ICD-10-CM | POA: Diagnosis not present

## 2021-05-26 DIAGNOSIS — Z171 Estrogen receptor negative status [ER-]: Secondary | ICD-10-CM | POA: Diagnosis not present

## 2021-05-26 DIAGNOSIS — Z510 Encounter for antineoplastic radiation therapy: Secondary | ICD-10-CM | POA: Diagnosis not present

## 2021-05-26 LAB — RAD ONC ARIA SESSION SUMMARY
Course Elapsed Days: 6
Plan Fractions Treated to Date: 5
Plan Prescribed Dose Per Fraction: 2.67 Gy
Plan Total Fractions Prescribed: 16
Plan Total Prescribed Dose: 42.72 Gy
Reference Point Dosage Given to Date: 13.35 Gy
Reference Point Session Dosage Given: 2.67 Gy
Session Number: 5

## 2021-05-26 MED ORDER — RADIAPLEXRX EX GEL: Freq: Once | CUTANEOUS | Status: AC

## 2021-05-26 MED ORDER — ALRA NON-METALLIC DEODORANT (RAD-ONC)
1.0000 "application " | Freq: Once | TOPICAL | Status: AC
  Administered 2021-05-26: 1 via TOPICAL

## 2021-05-26 NOTE — Progress Notes (Signed)
Pt here for patient teaching.    Pt given Radiation and You booklet, skin care instructions, Alra deodorant, and Radiaplex gel.    Reviewed areas of pertinence such as fatigue, hair loss in treatment field, skin changes, breast tenderness, and breast swelling .   Pt able to give teach back of to pat skin and use unscented/gentle soap,apply Radiaplex bid, avoid applying anything to skin within 4 hours of treatment, avoid wearing an under wire bra, and to use an electric razor if they must shave.   Pt verbalizes understanding of information given and will contact nursing with any questions or concerns.          

## 2021-05-27 ENCOUNTER — Ambulatory Visit
Admission: RE | Admit: 2021-05-27 | Discharge: 2021-05-27 | Disposition: A | Discharge status: Home / Self Care | Payer: Medicare PPO | Source: Ambulatory Visit | Attending: Radiation Oncology | Admitting: Radiation Oncology

## 2021-05-27 ENCOUNTER — Other Ambulatory Visit: Payer: Self-pay

## 2021-05-27 DIAGNOSIS — Z510 Encounter for antineoplastic radiation therapy: Secondary | ICD-10-CM | POA: Diagnosis not present

## 2021-05-27 DIAGNOSIS — Z171 Estrogen receptor negative status [ER-]: Secondary | ICD-10-CM | POA: Diagnosis not present

## 2021-05-27 DIAGNOSIS — C50412 Malignant neoplasm of upper-outer quadrant of left female breast: Secondary | ICD-10-CM | POA: Diagnosis not present

## 2021-05-27 LAB — RAD ONC ARIA SESSION SUMMARY
Course Elapsed Days: 7
Plan Fractions Treated to Date: 6
Plan Prescribed Dose Per Fraction: 2.67 Gy
Plan Total Fractions Prescribed: 16
Plan Total Prescribed Dose: 42.72 Gy
Reference Point Dosage Given to Date: 16.02 Gy
Reference Point Session Dosage Given: 2.67 Gy
Session Number: 6

## 2021-05-28 ENCOUNTER — Ambulatory Visit
Admission: RE | Admit: 2021-05-28 | Discharge: 2021-05-28 | Disposition: A | Discharge status: Home / Self Care | Payer: Medicare PPO | Source: Ambulatory Visit | Attending: Radiation Oncology | Admitting: Radiation Oncology

## 2021-05-28 ENCOUNTER — Other Ambulatory Visit: Payer: Self-pay

## 2021-05-28 DIAGNOSIS — Z171 Estrogen receptor negative status [ER-]: Secondary | ICD-10-CM | POA: Diagnosis not present

## 2021-05-28 DIAGNOSIS — C50412 Malignant neoplasm of upper-outer quadrant of left female breast: Secondary | ICD-10-CM | POA: Diagnosis not present

## 2021-05-28 DIAGNOSIS — Z510 Encounter for antineoplastic radiation therapy: Secondary | ICD-10-CM | POA: Diagnosis not present

## 2021-05-28 LAB — RAD ONC ARIA SESSION SUMMARY
Course Elapsed Days: 8
Plan Fractions Treated to Date: 7
Plan Prescribed Dose Per Fraction: 2.67 Gy
Plan Total Fractions Prescribed: 16
Plan Total Prescribed Dose: 42.72 Gy
Reference Point Dosage Given to Date: 18.69 Gy
Reference Point Session Dosage Given: 2.67 Gy
Session Number: 7

## 2021-05-29 ENCOUNTER — Other Ambulatory Visit: Payer: Self-pay

## 2021-05-29 ENCOUNTER — Ambulatory Visit
Admission: RE | Admit: 2021-05-29 | Discharge: 2021-05-29 | Disposition: A | Discharge status: Home / Self Care | Payer: Medicare PPO | Source: Ambulatory Visit | Attending: Radiation Oncology | Admitting: Radiation Oncology

## 2021-05-29 DIAGNOSIS — Z171 Estrogen receptor negative status [ER-]: Secondary | ICD-10-CM | POA: Diagnosis not present

## 2021-05-29 DIAGNOSIS — Z510 Encounter for antineoplastic radiation therapy: Secondary | ICD-10-CM | POA: Diagnosis not present

## 2021-05-29 DIAGNOSIS — C50412 Malignant neoplasm of upper-outer quadrant of left female breast: Secondary | ICD-10-CM | POA: Diagnosis not present

## 2021-05-29 LAB — RAD ONC ARIA SESSION SUMMARY
Course Elapsed Days: 9
Plan Fractions Treated to Date: 8
Plan Prescribed Dose Per Fraction: 2.67 Gy
Plan Total Fractions Prescribed: 16
Plan Total Prescribed Dose: 42.72 Gy
Reference Point Dosage Given to Date: 21.36 Gy
Reference Point Session Dosage Given: 2.67 Gy
Session Number: 8

## 2021-06-01 ENCOUNTER — Other Ambulatory Visit: Payer: Self-pay

## 2021-06-01 ENCOUNTER — Ambulatory Visit
Admission: RE | Admit: 2021-06-01 | Discharge: 2021-06-01 | Disposition: A | Discharge status: Home / Self Care | Payer: Medicare PPO | Source: Ambulatory Visit | Attending: Radiation Oncology | Admitting: Radiation Oncology

## 2021-06-01 DIAGNOSIS — C50412 Malignant neoplasm of upper-outer quadrant of left female breast: Secondary | ICD-10-CM | POA: Diagnosis not present

## 2021-06-01 DIAGNOSIS — Z510 Encounter for antineoplastic radiation therapy: Secondary | ICD-10-CM | POA: Diagnosis not present

## 2021-06-01 DIAGNOSIS — Z171 Estrogen receptor negative status [ER-]: Secondary | ICD-10-CM | POA: Diagnosis not present

## 2021-06-01 LAB — RAD ONC ARIA SESSION SUMMARY
Course Elapsed Days: 12
Plan Fractions Treated to Date: 9
Plan Prescribed Dose Per Fraction: 2.67 Gy
Plan Total Fractions Prescribed: 16
Plan Total Prescribed Dose: 42.72 Gy
Reference Point Dosage Given to Date: 24.03 Gy
Reference Point Session Dosage Given: 2.67 Gy
Session Number: 9

## 2021-06-02 ENCOUNTER — Other Ambulatory Visit: Payer: Self-pay

## 2021-06-02 ENCOUNTER — Ambulatory Visit
Admission: RE | Admit: 2021-06-02 | Discharge: 2021-06-02 | Disposition: A | Discharge status: Home / Self Care | Payer: Medicare PPO | Source: Ambulatory Visit | Attending: Radiation Oncology | Admitting: Radiation Oncology

## 2021-06-02 DIAGNOSIS — Z171 Estrogen receptor negative status [ER-]: Secondary | ICD-10-CM | POA: Diagnosis not present

## 2021-06-02 DIAGNOSIS — C50412 Malignant neoplasm of upper-outer quadrant of left female breast: Secondary | ICD-10-CM | POA: Diagnosis not present

## 2021-06-02 DIAGNOSIS — Z510 Encounter for antineoplastic radiation therapy: Secondary | ICD-10-CM | POA: Diagnosis not present

## 2021-06-02 LAB — RAD ONC ARIA SESSION SUMMARY
Course Elapsed Days: 13
Plan Fractions Treated to Date: 10
Plan Prescribed Dose Per Fraction: 2.67 Gy
Plan Total Fractions Prescribed: 16
Plan Total Prescribed Dose: 42.72 Gy
Reference Point Dosage Given to Date: 26.7 Gy
Reference Point Session Dosage Given: 2.67 Gy
Session Number: 10

## 2021-06-03 ENCOUNTER — Other Ambulatory Visit: Payer: Self-pay

## 2021-06-03 ENCOUNTER — Ambulatory Visit
Admission: RE | Admit: 2021-06-03 | Discharge: 2021-06-03 | Disposition: A | Discharge status: Home / Self Care | Payer: Medicare PPO | Source: Ambulatory Visit | Attending: Radiation Oncology | Admitting: Radiation Oncology

## 2021-06-03 DIAGNOSIS — Z171 Estrogen receptor negative status [ER-]: Secondary | ICD-10-CM | POA: Diagnosis not present

## 2021-06-03 DIAGNOSIS — C50412 Malignant neoplasm of upper-outer quadrant of left female breast: Secondary | ICD-10-CM | POA: Diagnosis not present

## 2021-06-03 DIAGNOSIS — Z510 Encounter for antineoplastic radiation therapy: Secondary | ICD-10-CM | POA: Diagnosis not present

## 2021-06-03 LAB — RAD ONC ARIA SESSION SUMMARY
Course Elapsed Days: 14
Plan Fractions Treated to Date: 11
Plan Prescribed Dose Per Fraction: 2.67 Gy
Plan Total Fractions Prescribed: 16
Plan Total Prescribed Dose: 42.72 Gy
Reference Point Dosage Given to Date: 29.37 Gy
Reference Point Session Dosage Given: 2.67 Gy
Session Number: 11

## 2021-06-04 ENCOUNTER — Ambulatory Visit
Admission: RE | Admit: 2021-06-04 | Discharge: 2021-06-04 | Disposition: A | Discharge status: Home / Self Care | Payer: Medicare PPO | Source: Ambulatory Visit | Attending: Radiation Oncology | Admitting: Radiation Oncology

## 2021-06-04 ENCOUNTER — Other Ambulatory Visit: Payer: Self-pay

## 2021-06-04 DIAGNOSIS — Z171 Estrogen receptor negative status [ER-]: Secondary | ICD-10-CM | POA: Diagnosis not present

## 2021-06-04 DIAGNOSIS — C50412 Malignant neoplasm of upper-outer quadrant of left female breast: Secondary | ICD-10-CM | POA: Diagnosis not present

## 2021-06-04 DIAGNOSIS — Z510 Encounter for antineoplastic radiation therapy: Secondary | ICD-10-CM | POA: Diagnosis not present

## 2021-06-04 LAB — RAD ONC ARIA SESSION SUMMARY
Course Elapsed Days: 15
Plan Fractions Treated to Date: 12
Plan Prescribed Dose Per Fraction: 2.67 Gy
Plan Total Fractions Prescribed: 16
Plan Total Prescribed Dose: 42.72 Gy
Reference Point Dosage Given to Date: 32.04 Gy
Reference Point Session Dosage Given: 2.67 Gy
Session Number: 12

## 2021-06-05 ENCOUNTER — Other Ambulatory Visit: Payer: Self-pay

## 2021-06-05 ENCOUNTER — Ambulatory Visit
Admission: RE | Admit: 2021-06-05 | Discharge: 2021-06-05 | Disposition: A | Discharge status: Home / Self Care | Payer: Medicare PPO | Source: Ambulatory Visit | Attending: Radiation Oncology | Admitting: Radiation Oncology

## 2021-06-05 DIAGNOSIS — Z510 Encounter for antineoplastic radiation therapy: Secondary | ICD-10-CM | POA: Diagnosis not present

## 2021-06-05 DIAGNOSIS — Z171 Estrogen receptor negative status [ER-]: Secondary | ICD-10-CM | POA: Diagnosis not present

## 2021-06-05 DIAGNOSIS — C50412 Malignant neoplasm of upper-outer quadrant of left female breast: Secondary | ICD-10-CM | POA: Diagnosis not present

## 2021-06-05 LAB — RAD ONC ARIA SESSION SUMMARY
Course Elapsed Days: 16
Plan Fractions Treated to Date: 13
Plan Prescribed Dose Per Fraction: 2.67 Gy
Plan Total Fractions Prescribed: 16
Plan Total Prescribed Dose: 42.72 Gy
Reference Point Dosage Given to Date: 34.71 Gy
Reference Point Session Dosage Given: 2.67 Gy
Session Number: 13

## 2021-06-08 ENCOUNTER — Ambulatory Visit
Admission: RE | Admit: 2021-06-08 | Discharge: 2021-06-08 | Disposition: A | Discharge status: Home / Self Care | Payer: Medicare PPO | Source: Ambulatory Visit | Attending: Radiation Oncology | Admitting: Radiation Oncology

## 2021-06-08 ENCOUNTER — Other Ambulatory Visit: Payer: Self-pay

## 2021-06-08 DIAGNOSIS — Z171 Estrogen receptor negative status [ER-]: Secondary | ICD-10-CM | POA: Diagnosis not present

## 2021-06-08 DIAGNOSIS — C50412 Malignant neoplasm of upper-outer quadrant of left female breast: Secondary | ICD-10-CM | POA: Diagnosis not present

## 2021-06-08 DIAGNOSIS — Z510 Encounter for antineoplastic radiation therapy: Secondary | ICD-10-CM | POA: Diagnosis not present

## 2021-06-08 LAB — RAD ONC ARIA SESSION SUMMARY
Course Elapsed Days: 19
Plan Fractions Treated to Date: 14
Plan Prescribed Dose Per Fraction: 2.67 Gy
Plan Total Fractions Prescribed: 16
Plan Total Prescribed Dose: 42.72 Gy
Reference Point Dosage Given to Date: 37.38 Gy
Reference Point Session Dosage Given: 2.67 Gy
Session Number: 14

## 2021-06-09 ENCOUNTER — Encounter: Payer: Self-pay | Admitting: *Deleted

## 2021-06-09 ENCOUNTER — Ambulatory Visit
Admission: RE | Admit: 2021-06-09 | Discharge: 2021-06-09 | Disposition: A | Discharge status: Home / Self Care | Payer: Medicare PPO | Source: Ambulatory Visit | Attending: Radiation Oncology | Admitting: Radiation Oncology

## 2021-06-09 ENCOUNTER — Other Ambulatory Visit: Payer: Self-pay

## 2021-06-09 DIAGNOSIS — Z171 Estrogen receptor negative status [ER-]: Secondary | ICD-10-CM

## 2021-06-09 DIAGNOSIS — Z510 Encounter for antineoplastic radiation therapy: Secondary | ICD-10-CM | POA: Diagnosis not present

## 2021-06-09 DIAGNOSIS — C50412 Malignant neoplasm of upper-outer quadrant of left female breast: Secondary | ICD-10-CM | POA: Diagnosis not present

## 2021-06-09 LAB — RAD ONC ARIA SESSION SUMMARY
Course Elapsed Days: 20
Plan Fractions Treated to Date: 15
Plan Prescribed Dose Per Fraction: 2.67 Gy
Plan Total Fractions Prescribed: 16
Plan Total Prescribed Dose: 42.72 Gy
Reference Point Dosage Given to Date: 40.05 Gy
Reference Point Session Dosage Given: 2.67 Gy
Session Number: 15

## 2021-06-09 MED ORDER — RADIAPLEXRX EX GEL: Freq: Once | CUTANEOUS | Status: AC

## 2021-06-10 ENCOUNTER — Encounter: Payer: Self-pay | Admitting: Radiation Oncology

## 2021-06-10 ENCOUNTER — Ambulatory Visit
Admission: RE | Admit: 2021-06-10 | Discharge: 2021-06-10 | Disposition: A | Discharge status: Home / Self Care | Payer: Medicare PPO | Source: Ambulatory Visit | Attending: Radiation Oncology | Admitting: Radiation Oncology

## 2021-06-10 ENCOUNTER — Other Ambulatory Visit: Payer: Self-pay

## 2021-06-10 DIAGNOSIS — Z510 Encounter for antineoplastic radiation therapy: Secondary | ICD-10-CM | POA: Diagnosis not present

## 2021-06-10 DIAGNOSIS — C50412 Malignant neoplasm of upper-outer quadrant of left female breast: Secondary | ICD-10-CM | POA: Diagnosis not present

## 2021-06-10 DIAGNOSIS — Z171 Estrogen receptor negative status [ER-]: Secondary | ICD-10-CM | POA: Diagnosis not present

## 2021-06-10 LAB — RAD ONC ARIA SESSION SUMMARY
Course Elapsed Days: 21
Plan Fractions Treated to Date: 16
Plan Prescribed Dose Per Fraction: 2.67 Gy
Plan Total Fractions Prescribed: 16
Plan Total Prescribed Dose: 42.72 Gy
Reference Point Dosage Given to Date: 42.72 Gy
Reference Point Session Dosage Given: 2.67 Gy
Session Number: 16

## 2021-06-11 ENCOUNTER — Ambulatory Visit: Payer: Medicare PPO | Admitting: Family Medicine

## 2021-06-12 ENCOUNTER — Ambulatory Visit: Payer: Medicare PPO | Admitting: Family Medicine

## 2021-06-12 ENCOUNTER — Encounter: Payer: Self-pay | Admitting: Family Medicine

## 2021-06-12 VITALS — BP 122/70 | HR 76 | Temp 98.1°F | Ht 63.75 in | Wt 109.2 lb

## 2021-06-12 DIAGNOSIS — R531 Weakness: Secondary | ICD-10-CM | POA: Diagnosis not present

## 2021-06-12 DIAGNOSIS — C50412 Malignant neoplasm of upper-outer quadrant of left female breast: Secondary | ICD-10-CM

## 2021-06-12 DIAGNOSIS — F4321 Adjustment disorder with depressed mood: Secondary | ICD-10-CM

## 2021-06-12 DIAGNOSIS — Z171 Estrogen receptor negative status [ER-]: Secondary | ICD-10-CM | POA: Diagnosis not present

## 2021-06-12 NOTE — Progress Notes (Signed)
Subjective:    Patient ID: Sierra Bentley, female    DOB: February 23, 1933, 88 y.o.   MRN: [id number]  HPI Pt presents with c/o leg problems /trouble walking   Wt Readings from Last 3 Encounters:  06/12/21 109 lb 4 oz (49.6 kg)  05/11/21 109 lb 4 oz (49.6 kg)  04/22/21 109 lb 9.6 oz (49.7 kg)   18.90 kg/m  Appetite is ok  Now she is eating a lot "too much"   Currently in breast cancer treatment   Hormone receptor negative Had L breast lumpectomy  Adj radiation - final treatment was 5/17  She got a good experience   Is very tired  Was a lot to go through   Legs are not swollen but they are stiff   Feels like she has lost a lot of strength  Is worn out when she tries to do anything    Lost her husband and her dog in the past year (in addn to her cancer) Working on estate now  Also grief    Trying to take care of herself now  Talks to her grandson (is a physician) -helpful and her pOA, and her son took her to the cancer center  This is good  No female support unfortunately  No one close by   No church since covid started     BP Readings from Last 3 Encounters:  06/12/21 122/70  05/11/21 136/67  04/22/21 (!) 135/53   Pulse Readings from Last 3 Encounters:  06/12/21 76  05/11/21 70  04/22/21 76     Lab Results  Component Value Date   CREATININE 0.65 04/03/2021   BUN 19 04/03/2021   NA 139 04/03/2021   K 4.3 04/03/2021   CL 103 04/03/2021   CO2 28 04/03/2021   Lab Results  Component Value Date   WBC 5.3 03/18/2021   HGB 12.1 03/18/2021   HCT 37.7 03/18/2021   MCV 87.1 03/18/2021   PLT 244 03/18/2021   Lab Results  Component Value Date   [id number] 829 03/19/2021   Lab Results  Component Value Date   HGBA1C 5.4 03/19/2021   Lab Results  Component Value Date   TSH 1.88 10/10/2020     She drives Prefers not to drive far  (gets eye fatigue) or at night   Patient Active Problem List   Diagnosis Date Noted   Grief reaction 06/14/2021    General weakness 06/12/2021   Current use of proton pump inhibitor 03/19/2021   Family history of prostate cancer 03/18/2021   Malignant neoplasm of upper-outer quadrant of left breast in female, estrogen receptor negative ([place]) 03/17/2021   Lump of left thigh 05/07/2019   Poor balance 05/07/2019   Vestibular dizziness 03/31/2018   History of hip fracture 02/27/2018   Pedal edema 09/28/2017   Fall at home 09/28/2017   Prediabetes 11/30/2016   Toenail fungus 10/03/2015   At moderate risk for fall 09/05/2015   Cervical spondylosis without myelopathy 08/05/2015   Routine general medical examination at a health care facility 08/27/2014   Colon cancer screening 08/27/2014   Fall against object 08/15/2014   Encounter for Medicare annual wellness exam 07/25/2013   Varicosities of leg 05/23/2012   Abnormal ultrasound of thyroid gland 03/01/2011   Hyperlipidemia 06/11/2008   Allergic rhinitis 03/29/2008   Essential hypertension, benign 12/26/2006   GERD 12/26/2006   Rheumatoid arthritis ([place]) 12/26/2006   Osteoporosis 12/26/2006   URINARY INCONTINENCE 12/26/2006   Past Medical History:  Diagnosis Date   Allergic rhinitis, cause unspecified    Anemia    Breast cancer ([place])    left breast IDC   Chronically dry eyes, bilateral    Diverticulosis of colon    Endometrial polyp    Family history of prostate cancer 03/18/2021   Fuchs' corneal dystrophy of right eye    GERD (gastroesophageal reflux disease)    Hearing loss of both ears    pt stated has hearing aids but does not wear   History of epistaxis    per pt has had several cautization's for nose bleed   HTN (hypertension)    Hyperlipidemia    Macular degeneration of both eyes    followed '@[facility]  by dr Mamie Nick. mettu;   both age-related and right is with active choroidal neovascularization   OA (osteoarthritis)    OAB (overactive bladder)    Osteopenia    Thickened endometrium    Urge incontinence of urine    Wears glasses     Past Surgical History:  Procedure Laterality Date   BREAST BIOPSY Left 2011   fibrocystic change   BREAST LUMPECTOMY WITH RADIOACTIVE SEED LOCALIZATION Left 04/13/2021   Procedure: LEFT BREAST LUMPECTOMY WITH RADIOACTIVE SEED LOCALIZATION;  Surgeon: Rolm Bookbinder, MD;  Location: [facility];  Service: General;  Laterality: Left;   CATARACT EXTRACTION W/ INTRAOCULAR LENS IMPLANT Bilateral 2008   COLONOSCOPY  2013   DILATATION & CURETTAGE/HYSTEROSCOPY WITH MYOSURE N/A 11/26/2020   Procedure: [place];  Surgeon: Princess Bruins, MD;  Location: [place];  Service: Gynecology;  Laterality: N/A;   TONSILLECTOMY  1962   Social History   Tobacco Use   Smoking status: Never    Passive exposure: Never   Smokeless tobacco: Never  Vaping Use   Vaping Use: Never used  Substance Use Topics   Alcohol use: No    Alcohol/week: 0.0 standard drinks   Drug use: Never   Family History  Problem Relation Age of Onset   Other Mother        Arrythmia   Coronary artery disease Mother    Hypertension Mother    Anxiety disorder Mother    Heart attack Father 34   Coronary artery disease Father    Prostate cancer Father        dx unknown age   Throat cancer Brother        d. 39   Cancer Other        two maternal female cousins; unknown cancer; dx after 31   Depression Other        family history   Allergies  Allergen Reactions   Ace Inhibitors Cough   Boniva [Ibandronic Acid]     Esophageal distress   Chocolate Other (See Comments)    Severe headache and stopped up sinuses   Fosamax [Alendronate]     Esophageal distress   Oxycodone Other (See Comments)    Dizziness resulting in fall    Current Outpatient Medications on File Prior to Visit  Medication Sig Dispense Refill   latanoprost (XALATAN) 0.005 % ophthalmic solution 1 drop at bedtime.     losartan-hydrochlorothiazide (HYZAAR) 50-12.5 MG tablet Take 0.5  tablets by mouth every evening. 45 tablet 3   Multiple Vitamin (MULTIVITAMIN) tablet Take 1 tablet by mouth daily. Per pt has Postmenopausal multivitamin packet with 5 pills     pantoprazole (PROTONIX) 40 MG tablet Take 1 tablet (40 mg total) by mouth daily as needed. Per  pt when needed takes in the evening 90 tablet 3   Polyethyl Glycol-Propyl Glycol (SYSTANE ULTRA OP) Place 1 drop into both eyes 4 (four) times daily.     Probiotic Product (ALIGN EXTRA STRENGTH PO) Take by mouth.     sodium chloride (MURO 128) 5 % ophthalmic ointment Place 1 application into both eyes at bedtime.      TURMERIC PO Take 1,300 mg by mouth in the morning and at bedtime.     No current facility-administered medications on file prior to visit.    Review of Systems  Constitutional:  Positive for fatigue. Negative for activity change, appetite change, fever and unexpected weight change.  HENT:  Negative for congestion, ear pain, rhinorrhea, sinus pressure and sore throat.   Eyes:  Negative for pain, redness and visual disturbance.  Respiratory:  Negative for cough, shortness of breath and wheezing.   Cardiovascular:  Negative for chest pain and palpitations.  Gastrointestinal:  Negative for abdominal pain, blood in stool, constipation and diarrhea.  Endocrine: Negative for polydipsia and polyuria.  Genitourinary:  Negative for dysuria, frequency and urgency.  Musculoskeletal:  Negative for arthralgias, back pain and myalgias.  Skin:  Negative for pallor and rash.  Allergic/Immunologic: Negative for environmental allergies.  Neurological:  Positive for weakness. Negative for dizziness, syncope and headaches.       Generalized weakness/not focal   Hematological:  Negative for adenopathy. Does not bruise/bleed easily.  Psychiatric/Behavioral:  Negative for decreased concentration and dysphoric mood. The patient is not nervous/anxious.       Objective:   Physical Exam Constitutional:      General: She is not in  acute distress.    Appearance: Normal appearance. She is well-developed and normal weight. She is not ill-appearing or diaphoretic.     Comments: Under weight   HENT:     Head: Normocephalic and atraumatic.     Mouth/Throat:     Mouth: Mucous membranes are moist.  Eyes:     General: No scleral icterus.    Conjunctiva/sclera: Conjunctivae normal.     Pupils: Pupils are equal, round, and reactive to light.  Neck:     Thyroid: No thyromegaly.     Vascular: No carotid bruit or JVD.  Cardiovascular:     Rate and Rhythm: Normal rate and regular rhythm.     Heart sounds: Normal heart sounds.    No gallop.  Pulmonary:     Effort: Pulmonary effort is normal. No respiratory distress.     Breath sounds: Normal breath sounds. No wheezing or rales.  Abdominal:     General: There is no distension or abdominal bruit.     Palpations: Abdomen is soft.  Musculoskeletal:     Cervical back: Normal range of motion and neck supple.     Right lower leg: No edema.     Left lower leg: No edema.     Comments: No focal weakness Balance is fair  Nl gait   Lymphadenopathy:     Cervical: No cervical adenopathy.  Skin:    General: Skin is warm and dry.     Coloration: Skin is not jaundiced or pale.     Findings: No bruising, erythema or rash.  Neurological:     Mental Status: She is alert.     Cranial Nerves: No cranial nerve deficit.     Coordination: Coordination normal.     Deep Tendon Reflexes: Reflexes are normal and symmetric. Reflexes normal.     Comments: No focal  strength loss  Psychiatric:        Attention and Perception: Attention normal.        Mood and Affect: Mood normal. Mood is not anxious or depressed.        Cognition and Memory: Cognition and memory normal.     Comments: Candidly discusses symptoms and stressors   Mentally sharp          Assessment & Plan:   Problem List Items Addressed This Visit       Other   General weakness - Primary    Deconditioning after  treatment of breast cancer (surg and radiation)  Also grief reaction large life changes  Reassuring exam today Pt is motivated to get stronger  Open to PT in a few weeks after recovering from radiation  Encouraged good self care Activity- to advance slowly as tolerated Proper nutrition will be important        Grief reaction    Lost both husband and dog - this before her breast cancer treatment  Reviewed stressors/ coping techniques/symptoms/ support sources/ tx options and side effects in detail today  Doing surprisingly well  Declines need for help currently  Has good support  Encouraged socialization when she feels stronger        Malignant neoplasm of upper-outer quadrant of left breast in female, estrogen receptor negative ([place])    Doing well clinically (except for weakness) after radiation  She is pleased

## 2021-06-12 NOTE — Patient Instructions (Addendum)
Let us know when you are ready to do physical therapy and then I will put the referral in   Also tell us where you want to go  It will help you get stronger   Try to eat regularly  Balanced diet with protein and fruits and vegetables   Give yourself some time to get over everything you have been through

## 2021-06-14 DIAGNOSIS — F4320 Adjustment disorder, unspecified: Secondary | ICD-10-CM | POA: Insufficient documentation

## 2021-06-14 DIAGNOSIS — F4321 Adjustment disorder with depressed mood: Secondary | ICD-10-CM | POA: Insufficient documentation

## 2021-06-14 NOTE — Assessment & Plan Note (Signed)
Deconditioning after treatment of breast cancer (surg and radiation)  Also grief reaction large life changes  Reassuring exam today Pt is motivated to get stronger  Open to PT in a few weeks after recovering from radiation  Encouraged good self care Activity- to advance slowly as tolerated Proper nutrition will be important

## 2021-06-14 NOTE — Assessment & Plan Note (Signed)
Doing well clinically (except for weakness) after radiation  She is pleased

## 2021-06-14 NOTE — Assessment & Plan Note (Signed)
Lost both husband and dog - this before her breast cancer treatment  Reviewed stressors/ coping techniques/symptoms/ support sources/ tx options and side effects in detail today  Doing surprisingly well  Declines need for help currently  Has good support  Encouraged socialization when she feels stronger

## 2021-06-17 DIAGNOSIS — B351 Tinea unguium: Secondary | ICD-10-CM | POA: Diagnosis not present

## 2021-06-17 DIAGNOSIS — M79675 Pain in left toe(s): Secondary | ICD-10-CM | POA: Diagnosis not present

## 2021-06-17 DIAGNOSIS — M79674 Pain in right toe(s): Secondary | ICD-10-CM | POA: Diagnosis not present

## 2021-06-29 DIAGNOSIS — H353231 Exudative age-related macular degeneration, bilateral, with active choroidal neovascularization: Secondary | ICD-10-CM | POA: Diagnosis not present

## 2021-07-10 ENCOUNTER — Encounter: Payer: Self-pay | Admitting: Radiation Oncology

## 2021-07-11 NOTE — Progress Notes (Incomplete)
  Radiation Oncology         [phone]) [phone] ________________________________  Patient Name: Sierra Bentley MRN: [id number] DOB: 12-24-1933 Referring Physician: Loura Pardon (Profile Not Attached) Date of Service: 06/10/2021 [facility], [place]                                                        End Of Treatment Note  Diagnoses: C50.412-Malignant neoplasm of upper-outer quadrant of left female breast Z17.1-Estrogen receptor negative status [ER-]  Cancer Staging: S/p lumpectomy: Stage IIB (cT2, cN0, cM0) Left Breast UOQ, Invasive ductal carcinoma with intermediate grade DCIS, ER- / PR- / Her2-, Grade 2  Intent: Curative  Radiation Treatment Dates: 05/20/2021 through 06/10/2021 Site Technique Total Dose (Gy) Dose per Fx (Gy) Completed Fx Beam Energies  Breast, Left: Breast_L 3D 42.72/42.72 2.67 16/16 6XFFF   Narrative: The patient tolerated radiation therapy relatively well. During her final weekly treatment check on 06/09/21, the patient reported fatigue (which was her only issue during treatment) and denied any other symptoms. Physical exam performed on that same date showed minimal erythema and hyperpigmentation changes to the left breast area. No skin breakdown was appreciated.   Plan: The patient will follow-up with radiation oncology in one month .  ________________________________________________ -----------------------------------  Blair Promise, PhD, MD  This document serves as a record of services personally performed by Gery Pray, MD. It was created on his behalf by Roney Mans, a trained medical scribe. The creation of this record is based on the scribe's personal observations and the provider's statements to them. This document has been checked and approved by the attending provider.

## 2021-07-13 ENCOUNTER — Other Ambulatory Visit: Payer: Self-pay

## 2021-07-13 ENCOUNTER — Encounter: Payer: Self-pay | Admitting: Radiation Oncology

## 2021-07-13 ENCOUNTER — Ambulatory Visit
Admission: RE | Admit: 2021-07-13 | Discharge: 2021-07-13 | Disposition: A | Discharge status: Home / Self Care | Payer: Medicare PPO | Source: Ambulatory Visit | Attending: Radiation Oncology | Admitting: Radiation Oncology

## 2021-07-13 DIAGNOSIS — Z79899 Other long term (current) drug therapy: Secondary | ICD-10-CM | POA: Insufficient documentation

## 2021-07-13 DIAGNOSIS — C50412 Malignant neoplasm of upper-outer quadrant of left female breast: Secondary | ICD-10-CM | POA: Diagnosis not present

## 2021-07-13 DIAGNOSIS — Z171 Estrogen receptor negative status [ER-]: Secondary | ICD-10-CM | POA: Insufficient documentation

## 2021-07-13 DIAGNOSIS — Z923 Personal history of irradiation: Secondary | ICD-10-CM | POA: Diagnosis not present

## 2021-07-13 HISTORY — DX: Personal history of irradiation: Z92.3

## 2021-07-13 NOTE — Progress Notes (Signed)
Radiation Oncology         [phone] ________________________________  Name: Sierra Bentley MRN: [id number]  Date: 07/13/2021  DOB: Aug 26, 1933  Follow-Up Visit Note  CC: Tower, [name], MD  Tower, [name], MD    ICD-10-CM   1. Malignant neoplasm of upper-outer quadrant of left breast in female, estrogen receptor negative ([place])  C50.412    Z17.1       Diagnosis:  S/p lumpectomy: Stage IIB (cT2, cN0, cM0) Left Breast UOQ, Invasive ductal carcinoma with intermediate grade DCIS, ER- / PR- / Her2-, Grade 2  Interval Since Last Radiation: 1 month and 2 days   Intent: Curative  Radiation Treatment Dates: 05/20/2021 through 06/10/2021 Site Technique Total Dose (Gy) Dose per Fx (Gy) Completed Fx Beam Energies  Breast, Left: Breast_L 3D 42.72/42.72 2.67 16/16 6XFFF    Narrative:  The patient returns today for routine follow-up.  The patient tolerated radiation therapy relatively well. During her final weekly treatment check on 06/09/21, the patient reported fatigue (which was her only issue during treatment) and denied any other symptoms. Physical exam performed on that same date showed minimal erythema and hyperpigmentation changes to the left breast area. No skin breakdown was appreciated.   No significant interval history since her initial consultation date.   She reports some fatigue at this time which is slowly improving.  She has noticed some swelling in the breast and occasional discomfort with this issue but notes consistent pain.  She denies any problems with swelling in her left arm or hand.                               Allergies:  is allergic to ace inhibitors, boniva [ibandronic acid], chocolate, fosamax [alendronate], and oxycodone.  Meds: Current Outpatient Medications  Medication Sig Dispense Refill   latanoprost (XALATAN) 0.005 % ophthalmic solution 1 drop at bedtime.     losartan-hydrochlorothiazide (HYZAAR) 50-12.5 MG tablet Take 0.5 tablets by mouth every  evening. 45 tablet 3   Multiple Vitamin (MULTIVITAMIN) tablet Take 1 tablet by mouth daily. Per pt has Postmenopausal multivitamin packet with 5 pills     pantoprazole (PROTONIX) 40 MG tablet Take 1 tablet (40 mg total) by mouth daily as needed. Per pt when needed takes in the evening 90 tablet 3   Polyethyl Glycol-Propyl Glycol (SYSTANE ULTRA OP) Place 1 drop into both eyes 4 (four) times daily.     Probiotic Product (ALIGN EXTRA STRENGTH PO) Take by mouth.     sodium chloride (MURO 128) 5 % ophthalmic ointment Place 1 application into both eyes at bedtime.      TURMERIC PO Take 1,300 mg by mouth in the morning and at bedtime.     No current facility-administered medications for this encounter.    Physical Findings: The patient is in no acute distress. Patient is alert and oriented.  weight is 111 lb (50.3 kg). Her temperature is 97.9 F (36.6 C). Her blood pressure is 113/48 (abnormal). Her respiration is 18 and oxygen saturation is 99%. .   Lungs are clear to auscultation bilaterally. Heart has regular rate and rhythm. No palpable cervical, supraclavicular, or axillary adenopathy. Abdomen soft, non-tender, normal bowel sounds.  Left Breast: no palpable mass, nipple discharge or bleeding.  Skin is healed well.  Some mild swelling noted in the breast area.  No dominant mass appreciated breast nipple discharge or bleeding.  Some moles noted in the inferior  aspect of the breast.  Examination of the right breast reveals no palpable mass nipple discharge or bleeding.    Lab Findings: Lab Results  Component Value Date   WBC 5.3 03/18/2021   HGB 12.1 03/18/2021   HCT 37.7 03/18/2021   MCV 87.1 03/18/2021   PLT 244 03/18/2021    Radiographic Findings: No results found.  Impression:  S/p lumpectomy: Stage IIB (cT2, cN0, cM0) Left Breast UOQ, Invasive ductal carcinoma with intermediate grade DCIS, ER- / PR- / Her2-, Grade 2  The patient is recovering from the effects of radiation.  She has  some residual fatigue at this time but hopefully this issue should improve since she is now a month out from her treatment.  Plan: As needed follow-up in radiation oncology.  The patient will continue close follow-up in medical oncology and will proceed with survivorship later this week.   ____________________________________  Blair Promise, PhD, MD  This document serves as a record of services personally performed by Gery Pray, MD. It was created on his behalf by Roney Mans, a trained medical scribe. The creation of this record is based on the scribe's personal observations and the provider's statements to them. This document has been checked and approved by the attending provider.

## 2021-07-13 NOTE — Progress Notes (Signed)
Sierra Bentley is here today for follow up post radiation to the breast.   Breast Side:Left   They completed their radiation on: 06/10/21  Does the patient complain of any of the following: Post radiation skin issues: Patient states she has some small spots under left breast, that appear to be moles.  Breast Tenderness: No Breast Swelling: Mild Lymphadema: No Range of Motion limitations: No Fatigue post radiation: continues to have a low energy level.  Appetite good/fair/poor: Good   Additional comments if applicable: Patient reports having some discomfort to left side of chest. Patient states she thinks it indigestion. Currently taking protonix.   BP (!) 113/48   Temp 97.9 F (36.6 C)   Resp 18   Wt 111 lb (50.3 kg)   SpO2 99%   BMI 19.20 kg/m

## 2021-07-14 ENCOUNTER — Telehealth: Payer: Self-pay | Admitting: Adult Health

## 2021-07-14 NOTE — Telephone Encounter (Signed)
Rescheduled appointment per provider PAL. Left message. 

## 2021-07-23 ENCOUNTER — Encounter: Payer: Medicare PPO | Admitting: Adult Health

## 2021-08-03 DIAGNOSIS — H353231 Exudative age-related macular degeneration, bilateral, with active choroidal neovascularization: Secondary | ICD-10-CM | POA: Diagnosis not present

## 2021-08-07 ENCOUNTER — Telehealth: Payer: Self-pay | Admitting: Family Medicine

## 2021-08-07 DIAGNOSIS — R531 Weakness: Secondary | ICD-10-CM

## 2021-08-07 DIAGNOSIS — Z9181 History of falling: Secondary | ICD-10-CM

## 2021-08-07 DIAGNOSIS — R2689 Other abnormalities of gait and mobility: Secondary | ICD-10-CM

## 2021-08-07 DIAGNOSIS — H8190 Unspecified disorder of vestibular function, unspecified ear: Secondary | ICD-10-CM

## 2021-08-07 NOTE — Telephone Encounter (Signed)
Patient called back because Dr Glori Bickers told her to call when she was ready to do physical therapy when she was feeling better for her legs and balance. Patient said she would like to go one in [place] but couldn't think of the name, she said she will call back with the name

## 2021-08-11 NOTE — Telephone Encounter (Signed)
Forwarding

## 2021-08-11 NOTE — Telephone Encounter (Signed)
If she does not hear in 1-2 weeks please give [place] a call  Referral is done

## 2021-08-11 NOTE — Addendum Note (Signed)
Addended by: Loura Pardon A on: 08/11/2021 04:50 PM   Modules accepted: Orders

## 2021-08-11 NOTE — Telephone Encounter (Signed)
Patient called to have the physical therapy done and would like to go to [place]. Call back number [phone].

## 2021-08-12 DIAGNOSIS — R198 Other specified symptoms and signs involving the digestive system and abdomen: Secondary | ICD-10-CM | POA: Diagnosis not present

## 2021-08-12 DIAGNOSIS — K219 Gastro-esophageal reflux disease without esophagitis: Secondary | ICD-10-CM | POA: Diagnosis not present

## 2021-08-12 NOTE — Telephone Encounter (Signed)
Called and spoke with pt informed her of this

## 2021-08-13 ENCOUNTER — Encounter: Payer: Self-pay | Admitting: *Deleted

## 2021-08-14 ENCOUNTER — Inpatient Hospital Stay: Payer: Medicare PPO | Attending: Adult Health | Admitting: Adult Health

## 2021-08-14 ENCOUNTER — Other Ambulatory Visit: Payer: Self-pay

## 2021-08-14 ENCOUNTER — Encounter: Payer: Self-pay | Admitting: Adult Health

## 2021-08-14 ENCOUNTER — Telehealth: Payer: Self-pay

## 2021-08-14 VITALS — BP 134/75 | HR 79 | Temp 97.3°F | Resp 16 | Wt 112.9 lb

## 2021-08-14 DIAGNOSIS — Z171 Estrogen receptor negative status [ER-]: Secondary | ICD-10-CM

## 2021-08-14 DIAGNOSIS — Z923 Personal history of irradiation: Secondary | ICD-10-CM | POA: Diagnosis not present

## 2021-08-14 DIAGNOSIS — C50412 Malignant neoplasm of upper-outer quadrant of left female breast: Secondary | ICD-10-CM | POA: Diagnosis not present

## 2021-08-14 DIAGNOSIS — N6310 Unspecified lump in the right breast, unspecified quadrant: Secondary | ICD-10-CM | POA: Insufficient documentation

## 2021-08-14 DIAGNOSIS — Z79899 Other long term (current) drug therapy: Secondary | ICD-10-CM | POA: Insufficient documentation

## 2021-08-14 NOTE — Telephone Encounter (Signed)
Called pt to advise we scheduled her for a diagnostic MM bilateral (per [facility] guidelines). She is scheduled for 7/27 at 0900. Pt requested we schedule her later in the day. She was provided with number to [facility] to r/s if she needs to. She verbalized thanks and understanding.

## 2021-08-14 NOTE — Progress Notes (Signed)
SURVIVORSHIP  VISIT:    BRIEF ONCOLOGIC HISTORY:  Oncology History  Malignant neoplasm of upper-outer quadrant of left breast in female, estrogen receptor negative ([place])  03/10/2021 Initial Diagnosis   Screening mammogram detected left breast mass at 12 o'clock position: 1.6 cm, by ultrasound retroareolar mass 3.5 x 1.4 x 2.2 cm, additional mass 0.8 cm: Benign on biopsy, axilla negative   03/18/2021 Cancer Staging   Staging form: Breast, AJCC 8th Edition - Clinical stage from 03/18/2021: Stage IIB (cT2, cN0, cM0, G2, ER-, PR-, HER2-) - Signed by Nicholas Lose, MD on 03/18/2021 Stage prefix: Initial diagnosis Histologic grading system: 3 grade system   04/13/2021 Surgery   Left lumpectomy: Grade 2 invasive carcinoma with apocrine features, DCIS grade 2 with necrosis and microcalcifications, margins negative, ER 0%, PR 0%, HER2 negative, Ki-67 5% (lymph nodes not sampled)   05/20/2021 - 06/10/2021 Radiation Therapy   Site Technique Total Dose (Gy) Dose per Fx (Gy) Completed Fx Beam Energies  Breast, Left: Breast_L 3D 42.72/42.72 2.67 16/16 6XFFF       INTERVAL HISTORY:  Ms. Pauls to review her survivorship care plan detailing her treatment course for breast cancer, as well as monitoring long-term side effects of that treatment, education regarding health maintenance, screening, and overall wellness and health promotion.     Overall, Ms. Koslow reports feeling quite well.  She says that the only thing she notices since treatment completion is that her left breast is slightly fuller than her right.  She otherwise is feeling well today.   REVIEW OF SYSTEMS:  Review of Systems  Constitutional:  Negative for appetite change, chills, fatigue, fever and unexpected weight change.  HENT:   Negative for hearing loss, lump/mass and trouble swallowing.   Eyes:  Negative for eye problems and icterus.  Respiratory:  Negative for chest tightness, cough and shortness of breath.   Cardiovascular:  Negative  for chest pain, leg swelling and palpitations.  Gastrointestinal:  Negative for abdominal distention, abdominal pain, constipation, diarrhea, nausea and vomiting.  Endocrine: Negative for hot flashes.  Genitourinary:  Negative for difficulty urinating.   Musculoskeletal:  Negative for arthralgias.  Skin:  Negative for itching and rash.  Neurological:  Negative for dizziness, extremity weakness, headaches and numbness.  Hematological:  Negative for adenopathy. Does not bruise/bleed easily.  Psychiatric/Behavioral:  Negative for depression. The patient is not nervous/anxious.    Breast: Denies any new nodularity, masses, tenderness, nipple changes, or nipple discharge.      ONCOLOGY TREATMENT TEAM:  1. Surgeon:  Dr. Donne Hazel at [facility] 2. Medical Oncologist: Dr. Lindi Adie  3. Radiation Oncologist: Dr. Sondra Come    PAST MEDICAL/SURGICAL HISTORY:  Past Medical History:  Diagnosis Date   Allergic rhinitis, cause unspecified    Anemia    Breast cancer ([place])    left breast IDC   Chronically dry eyes, bilateral    Diverticulosis of colon    Endometrial polyp    Family history of prostate cancer 03/18/2021   Fuchs' corneal dystrophy of right eye    GERD (gastroesophageal reflux disease)    Hearing loss of both ears    pt stated has hearing aids but does not wear   History of epistaxis    per pt has had several cautization's for nose bleed   History of radiation therapy    05/20/21-06/10/21- Dr. Gery Pray   HTN (hypertension)    Hyperlipidemia    Macular degeneration of both eyes    followed $RemoveB'@[facility]$  by dr p.  mettu;   both age-related and right is with active choroidal neovascularization   OA (osteoarthritis)    OAB (overactive bladder)    Osteopenia    Thickened endometrium    Urge incontinence of urine    Wears glasses    Past Surgical History:  Procedure Laterality Date   BREAST BIOPSY Left 2011   fibrocystic change   BREAST LUMPECTOMY WITH RADIOACTIVE SEED  LOCALIZATION Left 04/13/2021   Procedure: LEFT BREAST LUMPECTOMY WITH RADIOACTIVE SEED LOCALIZATION;  Surgeon: Rolm Bookbinder, MD;  Location: [facility];  Service: General;  Laterality: Left;   CATARACT EXTRACTION W/ INTRAOCULAR LENS IMPLANT Bilateral 2008   COLONOSCOPY  2013   DILATATION & CURETTAGE/HYSTEROSCOPY WITH MYOSURE N/A 11/26/2020   Procedure: [place];  Surgeon: Princess Bruins, MD;  Location: [facility];  Service: Gynecology;  Laterality: N/A;   TONSILLECTOMY  1962     ALLERGIES:  Allergies  Allergen Reactions   Ace Inhibitors Cough   Boniva [Ibandronic Acid]     Esophageal distress   Chocolate Other (See Comments)    Severe headache and stopped up sinuses   Fosamax [Alendronate]     Esophageal distress   Oxycodone Other (See Comments)    Dizziness resulting in fall      CURRENT MEDICATIONS:  Outpatient Encounter Medications as of 08/14/2021  Medication Sig   latanoprost (XALATAN) 0.005 % ophthalmic solution 1 drop at bedtime.   losartan-hydrochlorothiazide (HYZAAR) 50-12.5 MG tablet Take 0.5 tablets by mouth every evening.   Multiple Vitamin (MULTIVITAMIN) tablet Take 1 tablet by mouth daily. Per pt has Postmenopausal multivitamin packet with 5 pills   pantoprazole (PROTONIX) 40 MG tablet Take 1 tablet (40 mg total) by mouth daily as needed. Per pt when needed takes in the evening   Polyethyl Glycol-Propyl Glycol (SYSTANE ULTRA OP) Place 1 drop into both eyes 4 (four) times daily.   Probiotic Product (ALIGN EXTRA STRENGTH PO) Take by mouth.   sodium chloride (MURO 128) 5 % ophthalmic ointment Place 1 application into both eyes at bedtime.    TURMERIC PO Take 1,300 mg by mouth in the morning and at bedtime.   No facility-administered encounter medications on file as of 08/14/2021.     ONCOLOGIC FAMILY HISTORY:  Family History  Problem Relation Age of Onset   Other Mother         Arrythmia   Coronary artery disease Mother    Hypertension Mother    Anxiety disorder Mother    Heart attack Father 39   Coronary artery disease Father    Prostate cancer Father        dx unknown age   Throat cancer Brother        d. 16   Cancer Other        two maternal female cousins; unknown cancer; dx after 15   Depression Other        family history    SOCIAL HISTORY:  Social History   Socioeconomic History   Marital status: Widowed    Spouse name: Not on file   Number of children: Not on file   Years of education: Not on file   Highest education level: Not on file  Occupational History   Not on file  Tobacco Use   Smoking status: Never    Passive exposure: Never   Smokeless tobacco: Never  Vaping Use   Vaping Use: Never used  Substance and Sexual Activity   Alcohol use: No  Alcohol/week: 0.0 standard drinks of alcohol   Drug use: Never   Sexual activity: Not Currently    Partners: Male    Birth control/protection: Post-menopausal  Other Topics Concern   Not on file  Social History Narrative   Married      No biological children (Has step children)      Taught HS x 30 years      Regular exercise         Social Determinants of Health   Financial Resource Strain: Low Risk  (12/16/2020)   Overall Financial Resource Strain (CARDIA)    Difficulty of Paying Living Expenses: Not hard at all  Food Insecurity: No Food Insecurity (12/16/2020)   Hunger Vital Sign    Worried About Running Out of Food in the Last Year: Never true    Ran Out of Food in the Last Year: Never true  Transportation Needs: No Transportation Needs (12/16/2020)   PRAPARE - Hydrologist (Medical): No    Lack of Transportation (Non-Medical): No  Physical Activity: Inactive (12/16/2020)   Exercise Vital Sign    Days of Exercise per Week: 0 days    Minutes of Exercise per Session: 0 min  Stress: No Stress Concern Present (12/16/2020)   [place]    Feeling of Stress : Not at all  Social Connections: Moderately Isolated (12/16/2020)   Social Connection and Isolation Panel [NHANES]    Frequency of Communication with Friends and Family: Twice a week    Frequency of Social Gatherings with Friends and Family: Twice a week    Attends Religious Services: Never    Marine scientist or Organizations: No    Attends Archivist Meetings: Never    Marital Status: Married  Human resources officer Violence: Not At Risk (12/16/2020)   Humiliation, Afraid, Rape, and Kick questionnaire    Fear of Current or Ex-Partner: No    Emotionally Abused: No    Physically Abused: No    Sexually Abused: No     OBSERVATIONS/OBJECTIVE:  BP 134/75 (BP Location: Left Arm, Patient Position: Sitting)   Pulse 79   Temp (!) 97.3 F (36.3 C) (Temporal)   Resp 16   Wt 112 lb 14.4 oz (51.2 kg)   SpO2 98%   BMI 19.53 kg/m  GENERAL: Patient is a well appearing female in no acute distress HEENT:  Sclerae anicteric.  Oropharynx clear and moist. No ulcerations or evidence of oropharyngeal candidiasis. Neck is supple.  NODES:  No cervical, supraclavicular, or axillary lymphadenopathy palpated.  BREAST EXAM:  right breast with small 77mm well circumscribed nodule about 0.5cmfn at 6 oclock, otherwise benign, left breast s/p lumpectomy and radiation, no sign of local recurrence. LUNGS:  Clear to auscultation bilaterally.  No wheezes or rhonchi. HEART:  Regular rate and rhythm. No murmur appreciated. ABDOMEN:  Soft, nontender.  Positive, normoactive bowel sounds. No organomegaly palpated. MSK:  No focal spinal tenderness to palpation. Full range of motion bilaterally in the upper extremities. EXTREMITIES:  No peripheral edema.   SKIN:  Clear with no obvious rashes or skin changes. No nail dyscrasia. NEURO:  Nonfocal. Well oriented.  Appropriate affect.   LABORATORY DATA:  None for this  visit.  DIAGNOSTIC IMAGING:  None for this visit.      ASSESSMENT AND PLAN:  Ms.. Khurana is a pleasant 88 y.o. female with Stage IIB left breast invasive ductal carcinoma, ER-/PR-/HER2-, diagnosed in 02/2021,  treated with lumpectomy & adjuvant radiation therapy.  She presents to the Survivorship Clinic for our initial meeting and routine follow-up post-completion of treatment for breast cancer.    1. Stage IIB left breast cancer:  Ms. Quant is continuing to recover from definitive treatment for breast cancer. She will follow-up with her medical oncologist, Dr. Lindi Adie in 6 months with history and physical exam per surveillance protocol.  Today, a comprehensive survivorship care plan and treatment summary was reviewed with the patient today detailing her breast cancer diagnosis, treatment course, potential late/long-term effects of treatment, appropriate follow-up care with recommendations for the future, and patient education resources.  A copy of this summary, along with a letter will be sent to the patient's primary care provider via mail/fax/In Basket message after today's visit.    2. Breast nodule: I placed orders for her to undergo right breast diagnostic mammogram and ultrasound at [facility] and asked my nurse Mickel Baas to fax these over to [place] today.  She is also due for bilateral diagnostic mammogram in December 2023.  3. Bone health: She was recommended to continue bone density testing at the direction and guidance of her primary care provider.  She was given education on specific activities to promote bone health.  4. Cancer screening:  Due to Ms. Haydon's history and her age, she should receive screening for skin cancers.  The information and recommendations are listed on the patient's comprehensive care plan/treatment summary and were reviewed in detail with the patient.    5. Health maintenance and wellness promotion: Ms. Mcquitty was encouraged to consume 5-7 servings of fruits and vegetables  per day. We reviewed the "Nutrition Rainbow" handout.  She was also encouraged to engage in moderate to vigorous exercise for 30 minutes per day most days of the week. We discussed the [employer] fitness program, which is designed for cancer survivors to help them become more physically fit after cancer treatments.  She was instructed to limit her alcohol consumption and continue to abstain from tobacco use.     6. Support services/counseling: It is not uncommon for this period of the patient's cancer care trajectory to be one of many emotions and stressors.  She was given information regarding our available services and encouraged to contact me with any questions or for help enrolling in any of our support group/programs.    Follow up instructions:    -Return to cancer center in 6 months   -Mammogram due in 12/2021 -right breast mammogram and ultrasound within the next week -Follow up with surgery in one year -She is welcome to return back to the Survivorship Clinic at any time; no additional follow-up needed at this time.  -Consider referral back to survivorship as a long-term survivor for continued surveillance  The patient was provided an opportunity to ask questions and all were answered. The patient agreed with the plan and demonstrated an understanding of the instructions.   Total encounter time:30 minutes*in face-to-face visit time, chart review, lab review, care coordination, order entry, and documentation of the encounter time.    Wilber Bihari, NP 08/14/21 2:18 PM Medical Oncology and Hematology [facility] [place], [place] [address] Tel. [phone]    Fax. [phone]  *Total Encounter Time as defined by the Centers for Medicare and Medicaid Services includes, in addition to the face-to-face time of a patient visit (documented in the note above) non-face-to-face time: obtaining and reviewing outside history, ordering and reviewing  medications, tests or procedures, care  coordination (communications with other health care professionals or caregivers) and documentation in the medical record.

## 2021-08-17 ENCOUNTER — Telehealth: Payer: Self-pay | Admitting: Adult Health

## 2021-08-17 NOTE — Telephone Encounter (Signed)
Scheduled appointment per 7/21 los. Patient is aware.

## 2021-08-20 DIAGNOSIS — R922 Inconclusive mammogram: Secondary | ICD-10-CM | POA: Diagnosis not present

## 2021-08-20 DIAGNOSIS — N6001 Solitary cyst of right breast: Secondary | ICD-10-CM | POA: Diagnosis not present

## 2021-08-24 ENCOUNTER — Encounter: Payer: Self-pay | Admitting: Obstetrics & Gynecology

## 2021-08-26 ENCOUNTER — Telehealth: Payer: Self-pay

## 2021-08-26 NOTE — Telephone Encounter (Signed)
Attempted to call pt to discuss Br [place] results from [place]; wanted to ensure pt is aware results are benign. LVM for pt to return call for explanation of results.

## 2021-08-27 ENCOUNTER — Encounter: Payer: Self-pay | Admitting: Obstetrics & Gynecology

## 2021-08-28 ENCOUNTER — Telehealth: Payer: Self-pay | Admitting: *Deleted

## 2021-08-28 NOTE — Telephone Encounter (Signed)
error 

## 2021-08-31 ENCOUNTER — Telehealth: Payer: Self-pay

## 2021-08-31 NOTE — Telephone Encounter (Signed)
Pt returned call to obtain MM/US results. Pt made aware results are benign and it appears the palpated mass is a benign cyst. She is aware she will f/u with diagnostic MM 04/18/22 at 1115 at [facility]. She knows to call for any further questions/concerns.

## 2021-09-07 DIAGNOSIS — H04129 Dry eye syndrome of unspecified lacrimal gland: Secondary | ICD-10-CM | POA: Diagnosis not present

## 2021-09-07 DIAGNOSIS — H353123 Nonexudative age-related macular degeneration, left eye, advanced atrophic without subfoveal involvement: Secondary | ICD-10-CM | POA: Diagnosis not present

## 2021-09-07 DIAGNOSIS — Z961 Presence of intraocular lens: Secondary | ICD-10-CM | POA: Diagnosis not present

## 2021-09-07 DIAGNOSIS — H18513 Endothelial corneal dystrophy, bilateral: Secondary | ICD-10-CM | POA: Diagnosis not present

## 2021-09-07 DIAGNOSIS — H353231 Exudative age-related macular degeneration, bilateral, with active choroidal neovascularization: Secondary | ICD-10-CM | POA: Diagnosis not present

## 2021-10-07 DIAGNOSIS — R278 Other lack of coordination: Secondary | ICD-10-CM | POA: Diagnosis not present

## 2021-10-07 DIAGNOSIS — R2689 Other abnormalities of gait and mobility: Secondary | ICD-10-CM | POA: Diagnosis not present

## 2021-10-12 DIAGNOSIS — H353231 Exudative age-related macular degeneration, bilateral, with active choroidal neovascularization: Secondary | ICD-10-CM | POA: Diagnosis not present

## 2021-10-14 ENCOUNTER — Encounter: Payer: Self-pay | Admitting: Obstetrics & Gynecology

## 2021-10-14 ENCOUNTER — Ambulatory Visit (INDEPENDENT_AMBULATORY_CARE_PROVIDER_SITE_OTHER): Payer: Medicare PPO | Admitting: Obstetrics & Gynecology

## 2021-10-14 VITALS — BP 120/72 | HR 68 | Ht 63.25 in | Wt 113.0 lb

## 2021-10-14 DIAGNOSIS — Z171 Estrogen receptor negative status [ER-]: Secondary | ICD-10-CM

## 2021-10-14 DIAGNOSIS — Z9189 Other specified personal risk factors, not elsewhere classified: Secondary | ICD-10-CM | POA: Diagnosis not present

## 2021-10-14 DIAGNOSIS — Z01419 Encounter for gynecological examination (general) (routine) without abnormal findings: Secondary | ICD-10-CM

## 2021-10-14 DIAGNOSIS — C50412 Malignant neoplasm of upper-outer quadrant of left female breast: Secondary | ICD-10-CM

## 2021-10-14 DIAGNOSIS — Z780 Asymptomatic menopausal state: Secondary | ICD-10-CM

## 2021-10-14 DIAGNOSIS — M810 Age-related osteoporosis without current pathological fracture: Secondary | ICD-10-CM

## 2021-10-14 NOTE — Progress Notes (Signed)
Sierra Bentley Jan 01, 1934 [id number]   History:    88 y.o. G0 Married.  Husband is 20 yo with mild Dementia and Kidney Failure on Home Dialysis.  Living together in their house.   RP:  Established patient presenting for annual gyn exam   HPI: Postmenopause, well on no hormone replacement therapy. No postmenopausal bleeding.    Abstinent.  Pap Neg in 2016, no indication to repeat a Pap.  Left Breast Cancer Dxed in 02/2021.  Mammo 08/2021 Benign, will obtain full report.  Body mass index 19.86. Physically active. Fasting health labs with family physician.  Osteoporosis previously and Pelvic bone fracture after a fall 02/2018.  BD 09/2020 Osteoporosis with T-Score -2.5 at Bilateral Fem Necks and AP Spine.  Declined Bone Medication.  Taking vitamin D supplements, calcium rich diet.  Health labs with Fam MD. Sierra Bentley 09/2011.  Past medical history,surgical history, family history and social history were all reviewed and documented in the EPIC chart.                                                                           Gynecologic History No LMP recorded. Patient is postmenopausal.  Obstetric History OB History  Gravida Para Term Preterm AB Living  0 0 0 0 0 0  SAB IAB Ectopic Multiple Live Births  0 0 0 0 0  Obstetric Comments  HAS 2 STEP SONS      ROS: A ROS was performed and pertinent positives and negatives are included in the history. GENERAL: No fevers or chills. HEENT: No change in vision, no earache, sore throat or sinus congestion. NECK: No pain or stiffness. CARDIOVASCULAR: No chest pain or pressure. No palpitations. PULMONARY: No shortness of breath, cough or wheeze. GASTROINTESTINAL: No abdominal pain, nausea, vomiting or diarrhea, melena or bright red blood per rectum. GENITOURINARY: No urinary frequency, urgency, hesitancy or dysuria. MUSCULOSKELETAL: No joint or muscle pain, no back pain, no recent trauma. DERMATOLOGIC: No rash, no itching, no lesions. ENDOCRINE: No polyuria,  polydipsia, no heat or cold intolerance. No recent change in weight. HEMATOLOGICAL: No anemia or easy bruising or bleeding. NEUROLOGIC: No headache, seizures, numbness, tingling or weakness. PSYCHIATRIC: No depression, no loss of interest in normal activity or change in sleep pattern.     Exam:   BP 120/72   Pulse 68   Ht 5' 3.25" (1.607 m)   Wt 113 lb (51.3 kg)   SpO2 97%   BMI 19.86 kg/m   Body mass index is 19.86 kg/m.  General appearance : Well developed well nourished female. No acute distress HEENT: Eyes: no retinal hemorrhage or exudates,  Neck supple, trachea midline, no carotid bruits, no thyroidmegaly Lungs: Clear to auscultation, no rhonchi or wheezes, or rib retractions  Heart: Regular rate and rhythm, no murmurs or gallops Breast:Examined in sitting and supine position were symmetrical in appearance, no palpable masses or tenderness,  no skin retraction, no nipple inversion, no nipple discharge, no skin discoloration, no axillary or supraclavicular lymphadenopathy Abdomen: no palpable masses or tenderness, no rebound or guarding Extremities: no edema or skin discoloration or tenderness  Pelvic: Vulva: Normal             Vagina: No gross  lesions or discharge  Cervix: No gross lesions or discharge  Uterus  AV, normal size, shape and consistency, non-tender and mobile  Adnexa  Without masses or tenderness  Anus: Normal   Assessment/Plan:  88 y.o. female for annual exam   1. Well female exam with routine gynecological exam Postmenopause, well on no hormone replacement therapy. No postmenopausal bleeding.    Abstinent.  Pap Neg in 2016, no indication to repeat a Pap.  Left Breast Cancer Dxed in 02/2021.  Mammo 08/2021 Benign, will obtain full report.  Body mass index 19.86. Physically active. Fasting health labs with family physician.  Osteoporosis previously and Pelvic bone fracture after a fall 02/2018.  BD 09/2020 Osteoporosis with T-Score -2.5 at Bilateral Fem Necks and AP  Spine.  Declined Bone Medication.  Taking vitamin D supplements, calcium rich diet.  Health labs with Fam MD. Sierra Bentley 09/2011.  2. Postmenopause Postmenopause, well on no hormone replacement therapy. No postmenopausal bleeding.    Abstinent.   3. Age-related osteoporosis without current pathological fracture   Osteoporosis previously and Pelvic bone fracture after a fall 02/2018.  BD 09/2020 Osteoporosis with T-Score -2.5 at Bilateral Fem Necks and AP Spine.  Declined Bone Medication.  Taking vitamin D supplements, calcium rich diet.   4. Malignant neoplasm of upper-outer quadrant of left breast in female, estrogen receptor negative (HCC)   Princess Bruins MD, 3:33 PM 10/14/2021

## 2021-10-26 DIAGNOSIS — R278 Other lack of coordination: Secondary | ICD-10-CM | POA: Diagnosis not present

## 2021-10-26 DIAGNOSIS — R2689 Other abnormalities of gait and mobility: Secondary | ICD-10-CM | POA: Diagnosis not present

## 2021-10-29 DIAGNOSIS — K219 Gastro-esophageal reflux disease without esophagitis: Secondary | ICD-10-CM | POA: Diagnosis not present

## 2021-10-29 DIAGNOSIS — Z79899 Other long term (current) drug therapy: Secondary | ICD-10-CM | POA: Diagnosis not present

## 2021-10-30 ENCOUNTER — Ambulatory Visit: Payer: Medicare PPO | Admitting: Family Medicine

## 2021-10-30 ENCOUNTER — Encounter: Payer: Self-pay | Admitting: Family Medicine

## 2021-10-30 VITALS — BP 116/62 | HR 75 | Temp 97.8°F | Ht 63.25 in | Wt 113.8 lb

## 2021-10-30 DIAGNOSIS — R531 Weakness: Secondary | ICD-10-CM

## 2021-10-30 DIAGNOSIS — I83893 Varicose veins of bilateral lower extremities with other complications: Secondary | ICD-10-CM | POA: Diagnosis not present

## 2021-10-30 DIAGNOSIS — R600 Localized edema: Secondary | ICD-10-CM | POA: Diagnosis not present

## 2021-10-30 DIAGNOSIS — I10 Essential (primary) hypertension: Secondary | ICD-10-CM

## 2021-10-30 DIAGNOSIS — R58 Hemorrhage, not elsewhere classified: Secondary | ICD-10-CM | POA: Insufficient documentation

## 2021-10-30 DIAGNOSIS — Z23 Encounter for immunization: Secondary | ICD-10-CM

## 2021-10-30 NOTE — Assessment & Plan Note (Signed)
Pt has several superficial resolving bruises on hands (resemble the type from thin skin and trauma) No asa or blood thinners or nsaids   Reassurance given  If these become symptomatic or happen all over, inst to let us know

## 2021-10-30 NOTE — Assessment & Plan Note (Signed)
She started some PT  Doing well so far  Enc her strongly to be compliant with home program Discussed how important exercise (incl strength training) is for both brain health and fall prevention as well as independence and mood and bone health

## 2021-10-30 NOTE — Assessment & Plan Note (Addendum)
bp in fair control at this time  BP Readings from Last 1 Encounters:  10/30/21 116/62   Pt's cuff today 104/57 after sitting / fairly close No changes needed Most recent labs reviewed  Disc lifstyle change with low sodium diet and exercise  Her cuff was fairly close  Continues losartan 50-12.5 mg half pill daily

## 2021-10-30 NOTE — Progress Notes (Signed)
Subjective:    Patient ID: Sierra Bentley, female    DOB: [birth year], 88 y.o.   MRN: [id number]  HPI Pt presents for skin changes on hands/arms Also concerned about strength  Occ swelling  Brought bp cuff to calibrate    Wt Readings from Last 3 Encounters:  10/30/21 113 lb 12.8 oz (51.6 kg)  10/14/21 113 lb (51.3 kg)  08/14/21 112 lb 14.4 oz (51.2 kg)   20.00 kg/m  Has a rash that comes and goes  Spots on her hands/lower arms Flat and just dark color (brown)  Come in the same place   Not icthy  Not sore   No asa  No nsaids  No trauma   H/o "weak wrists" for years   She goes to dermatology once per year for mole check Is due   When she had radiation -noted more swelling in legs  Also deconditioned  Now that is improved  (even better in the am after feet have been up)   Was trying to [facility] appt [facility] (doing [facility])  Started therapy - doing now  Has gone twice  Working at home    HTN bp is stable today  No cp or palpitations or headaches or edema  No side effects to medicines  BP Readings from Last 3 Encounters:  10/30/21 116/62  10/14/21 120/72  08/14/21 134/75     Losartan 50-12.5 mg 1/2 pill daily)   Brought her cuff today to calibrate it  Does not really check it at home 104/57      Lab Results  Component Value Date   CREATININE 0.65 04/03/2021   BUN 19 04/03/2021   NA 139 04/03/2021   K 4.3 04/03/2021   CL 103 04/03/2021   CO2 28 04/03/2021  GFR over 60    H/o LE varicosities   Also untreated RA  Patient Active Problem List   Diagnosis Date Noted   Ecchymosis 10/30/2021   Grief reaction 06/14/2021   General weakness 06/12/2021   Current use of proton pump inhibitor 03/19/2021   Family history of prostate cancer 03/18/2021   Malignant neoplasm of upper-outer quadrant of left breast in female, estrogen receptor negative ([place]) 03/17/2021   Lump of left thigh 05/07/2019   Poor balance 05/07/2019   Vestibular  dizziness 03/31/2018   History of hip fracture 02/27/2018   Pedal edema 09/28/2017   Fall at home 09/28/2017   Prediabetes 11/30/2016   Toenail fungus 10/03/2015   At moderate risk for fall 09/05/2015   Cervical spondylosis without myelopathy 08/05/2015   Routine general medical examination at a health care facility 08/27/2014   Colon cancer screening 08/27/2014   Fall against object 08/15/2014   Encounter for Medicare annual wellness exam 07/25/2013   Varicosities of leg 05/23/2012   Abnormal ultrasound of thyroid gland 03/01/2011   Hyperlipidemia 06/11/2008   Allergic rhinitis 03/29/2008   Essential hypertension, benign 12/26/2006   GERD 12/26/2006   Rheumatoid arthritis ([place]) 12/26/2006   Osteoporosis 12/26/2006   URINARY INCONTINENCE 12/26/2006   Past Medical History:  Diagnosis Date   Allergic rhinitis, cause unspecified    Anemia    Breast cancer ([place])    left breast IDC 2023   Chronically dry eyes, bilateral    Diverticulosis of colon    Endometrial polyp    Family history of prostate cancer 03/18/2021   Fuchs' corneal dystrophy of right eye    GERD (gastroesophageal reflux disease)    Hearing loss of  both ears    pt stated has hearing aids but does not wear   History of epistaxis    per pt has had several cautization's for nose bleed   History of radiation therapy    05/20/21-06/10/21- Dr. Gery Pray   HTN (hypertension)    Hyperlipidemia    Macular degeneration of both eyes    followed [facility]  by dr Mamie Nick. mettu;   both age-related and right is with active choroidal neovascularization   OA (osteoarthritis)    OAB (overactive bladder)    Osteopenia    Thickened endometrium    Urge incontinence of urine    Wears glasses    Past Surgical History:  Procedure Laterality Date   BREAST BIOPSY Left 2011   fibrocystic change   BREAST LUMPECTOMY WITH RADIOACTIVE SEED LOCALIZATION Left 04/13/2021   Procedure: LEFT BREAST LUMPECTOMY WITH RADIOACTIVE SEED LOCALIZATION;   Surgeon: Rolm Bookbinder, MD;  Location: [place];  Service: General;  Laterality: Left;   CATARACT EXTRACTION W/ INTRAOCULAR LENS IMPLANT Bilateral 2008   COLONOSCOPY  2013   [place] N/A 11/26/2020   Procedure: [place];  Surgeon: Princess Bruins, MD;  Location: [place];  Service: Gynecology;  Laterality: N/A;   TONSILLECTOMY  1962   Social History   Tobacco Use   Smoking status: Never    Passive exposure: Never   Smokeless tobacco: Never  Vaping Use   Vaping Use: Never used  Substance Use Topics   Alcohol use: No    Alcohol/week: 0.0 standard drinks of alcohol   Drug use: Never   Family History  Problem Relation Age of Onset   Other Mother        Arrythmia   Coronary artery disease Mother    Hypertension Mother    Anxiety disorder Mother    Heart attack Father 74   Coronary artery disease Father    Prostate cancer Father        dx unknown age   Throat cancer Brother        d. 60   Cancer Other        two maternal female cousins; unknown cancer; dx after 83   Depression Other        family history   Allergies  Allergen Reactions   Ace Inhibitors Cough   Boniva [Ibandronic Acid]     Esophageal distress   Chocolate Other (See Comments)    Severe headache and stopped up sinuses   Fosamax [Alendronate]     Esophageal distress   Oxycodone Other (See Comments)    Dizziness resulting in fall    Current Outpatient Medications on File Prior to Visit  Medication Sig Dispense Refill   latanoprost (XALATAN) 0.005 % ophthalmic solution 1 drop at bedtime.     losartan-hydrochlorothiazide (HYZAAR) 50-12.5 MG tablet Take 0.5 tablets by mouth every evening. 45 tablet 3   Multiple Vitamin (MULTIVITAMIN) tablet Take 1 tablet by mouth daily. Per pt has Postmenopausal multivitamin packet with 5 pills     pantoprazole (PROTONIX) 40 MG tablet Take 1 tablet (40 mg  total) by mouth daily as needed. Per pt when needed takes in the evening (Patient taking differently: Take 20 mg by mouth daily as needed. Per pt when needed takes in the evening) 90 tablet 3   Polyethyl Glycol-Propyl Glycol (SYSTANE ULTRA OP) Place 1 drop into both eyes 4 (four) times daily.     Probiotic Product (ALIGN EXTRA STRENGTH  PO) Take by mouth.     sodium chloride (MURO 128) 5 % ophthalmic ointment Place 1 application into both eyes at bedtime.      TURMERIC PO Take 1,300 mg by mouth in the morning and at bedtime.     No current facility-administered medications on file prior to visit.    Review of Systems  Constitutional:  Positive for fatigue. Negative for activity change, appetite change, fever and unexpected weight change.  HENT:  Negative for congestion, ear pain, rhinorrhea, sinus pressure and sore throat.   Eyes:  Negative for pain, redness and visual disturbance.  Respiratory:  Negative for cough, shortness of breath and wheezing.   Cardiovascular:  Negative for chest pain and palpitations.  Gastrointestinal:  Negative for abdominal pain, blood in stool, constipation and diarrhea.  Endocrine: Negative for polydipsia and polyuria.  Genitourinary:  Negative for dysuria, frequency and urgency.  Musculoskeletal:  Negative for arthralgias, back pain and myalgias.  Skin:  Negative for pallor and rash.  Allergic/Immunologic: Negative for environmental allergies.  Neurological:  Negative for dizziness, syncope and headaches.       Working on her balance and stability   Hematological:  Negative for adenopathy. Bruises/bleeds easily.  Psychiatric/Behavioral:  Negative for decreased concentration and dysphoric mood. The patient is not nervous/anxious.        Objective:   Physical Exam Constitutional:      General: She is not in acute distress.    Appearance: Normal appearance. She is well-developed and normal weight. She is not ill-appearing or diaphoretic.  HENT:     Head:  Normocephalic and atraumatic.     Mouth/Throat:     Mouth: Mucous membranes are moist.  Eyes:     General: No scleral icterus.    Conjunctiva/sclera: Conjunctivae normal.     Pupils: Pupils are equal, round, and reactive to light.  Neck:     Thyroid: No thyromegaly.     Vascular: No carotid bruit or JVD.  Cardiovascular:     Rate and Rhythm: Normal rate and regular rhythm.     Heart sounds: Normal heart sounds.     No gallop.  Pulmonary:     Effort: Pulmonary effort is normal. No respiratory distress.     Breath sounds: Normal breath sounds. No wheezing or rales.  Abdominal:     General: There is no distension or abdominal bruit.     Palpations: Abdomen is soft. There is no mass.     Tenderness: There is no abdominal tenderness.  Musculoskeletal:     Cervical back: Normal range of motion and neck supple. No tenderness.     Right lower leg: No edema.     Left lower leg: No edema.  Lymphadenopathy:     Cervical: No cervical adenopathy.  Skin:    General: Skin is warm and dry.     Coloration: Skin is not pale.     Findings: No rash.     Comments: Some 1 cm or less superficial bruises on hands (red to brown) and not blanching In setting of very thin skin  No skin tears or skin interruption  No rash   Neurological:     Mental Status: She is alert.     Cranial Nerves: No cranial nerve deficit.     Coordination: Coordination normal.     Deep Tendon Reflexes: Reflexes are normal and symmetric. Reflexes normal.  Psychiatric:        Attention and Perception: Attention normal.  Mood and Affect: Mood normal.        Cognition and Memory: Cognition normal.     Comments: Poor hearing  Communicates well   Candidly discusses symptoms and stressors  Lost pet,husb and had breast cancer this year            Assessment & Plan:   Problem List Items Addressed This Visit       Cardiovascular and Mediastinum   Essential hypertension, benign    bp in fair control at this  time  BP Readings from Last 1 Encounters:  10/30/21 116/62  Pt's cuff today 104/57 after sitting / fairly close No changes needed Most recent labs reviewed  Disc lifstyle change with low sodium diet and exercise  Her cuff was fairly close  Continues losartan 50-12.5 mg half pill daily      Varicosities of leg    No swelling today Plans to go back to supp hose this weekend when the weather is cooler  Reassuring exam today        Other   Ecchymosis - Primary    Pt has several superficial resolving bruises on hands (resemble the type from thin skin and trauma) No asa or blood thinners or nsaids   Reassurance given  If these become symptomatic or happen all over, inst to let us know       General weakness    She started some PT  Doing well so far  Enc her strongly to be compliant with home program Discussed how important exercise (incl strength training) is for both brain health and fall prevention as well as independence and mood and bone health       Pedal edema    None today  Suspect due to venous insuff Is dependent No cardiac symptoms   Plans to start using her supp stockings when the weather cools off  Enc less high sodium foods       Other Visit Diagnoses     Need for influenza vaccination       Relevant Orders   Flu Vaccine QUAD High Dose(Fluad) (Completed)

## 2021-10-30 NOTE — Assessment & Plan Note (Signed)
No swelling today Plans to go back to supp hose this weekend when the weather is cooler  Reassuring exam today

## 2021-10-30 NOTE — Patient Instructions (Addendum)
Flu shot today   For brain health and focus  Socialize  Read and do cognitive work and math/puzzles  Exercise (strength training is especially important) Stay fit!!  Keep doing your PT (really work on it)   Take care of yourself   Wear support socks to help veins and leg swelling   I think the spots on your arms/hands are superficial bruises  These get worse with thinning skin  If these areas itch or hurt let us know

## 2021-10-30 NOTE — Assessment & Plan Note (Signed)
None today  Suspect due to venous insuff Is dependent No cardiac symptoms   Plans to start using her supp stockings when the weather cools off  Enc less high sodium foods

## 2021-11-04 DIAGNOSIS — R278 Other lack of coordination: Secondary | ICD-10-CM | POA: Diagnosis not present

## 2021-11-04 DIAGNOSIS — R2689 Other abnormalities of gait and mobility: Secondary | ICD-10-CM | POA: Diagnosis not present

## 2021-11-16 DIAGNOSIS — H353231 Exudative age-related macular degeneration, bilateral, with active choroidal neovascularization: Secondary | ICD-10-CM | POA: Diagnosis not present

## 2021-11-18 DIAGNOSIS — R278 Other lack of coordination: Secondary | ICD-10-CM | POA: Diagnosis not present

## 2021-11-18 DIAGNOSIS — R2689 Other abnormalities of gait and mobility: Secondary | ICD-10-CM | POA: Diagnosis not present

## 2021-12-04 DIAGNOSIS — R278 Other lack of coordination: Secondary | ICD-10-CM | POA: Diagnosis not present

## 2021-12-04 DIAGNOSIS — R2689 Other abnormalities of gait and mobility: Secondary | ICD-10-CM | POA: Diagnosis not present

## 2021-12-21 DIAGNOSIS — H353231 Exudative age-related macular degeneration, bilateral, with active choroidal neovascularization: Secondary | ICD-10-CM | POA: Diagnosis not present

## 2021-12-23 ENCOUNTER — Ambulatory Visit (INDEPENDENT_AMBULATORY_CARE_PROVIDER_SITE_OTHER): Payer: Medicare PPO

## 2021-12-23 VITALS — Ht 63.25 in | Wt 113.0 lb

## 2021-12-23 DIAGNOSIS — Z0000 Encounter for general adult medical examination without abnormal findings: Secondary | ICD-10-CM

## 2021-12-23 NOTE — Patient Instructions (Signed)
Sierra Bentley , Thank you for taking time to come for your Medicare Wellness Visit. I appreciate your ongoing commitment to your health goals. Please review the following plan we discussed and let me know if I can assist you in the future.   Screening recommendations/referrals: Colonoscopy: aged out Mammogram: aged out Bone Density: 10/28/20 Recommended yearly ophthalmology/optometry visit for glaucoma screening and checkup Recommended yearly dental visit for hygiene and checkup  Vaccinations: Influenza vaccine: 10/30/21 Pneumococcal vaccine: 07/25/13 Tdap vaccine: 05/17/16 Shingles vaccine: Zostavax 03/29/08 Shingrix 09/03/20   Covid-19:03/13/19, 04/10/19  Advanced directives: no  Conditions/risks identified: none  Next appointment: Follow up in one year for your annual wellness visit 12/27/22 @ 10:45 am by phone   Preventive Care 65 Years and Older, Female Preventive care refers to lifestyle choices and visits with your health care provider that can promote health and wellness. What does preventive care include? A yearly physical exam. This is also called an annual well check. Dental exams once or twice a year. Routine eye exams. Ask your health care provider how often you should have your eyes checked. Personal lifestyle choices, including: Daily care of your teeth and gums. Regular physical activity. Eating a healthy diet. Avoiding tobacco and drug use. Limiting alcohol use. Practicing safe sex. Taking low-dose aspirin every day. Taking vitamin and mineral supplements as recommended by your health care provider. What happens during an annual well check? The services and screenings done by your health care provider during your annual well check will depend on your age, overall health, lifestyle risk factors, and family history of disease. Counseling  Your health care provider may ask you questions about your: Alcohol use. Tobacco use. Drug use. Emotional well-being. Home and  relationship well-being. Sexual activity. Eating habits. History of falls. Memory and ability to understand (cognition). Work and work Statistician. Reproductive health. Screening  You may have the following tests or measurements: Height, weight, and BMI. Blood pressure. Lipid and cholesterol levels. These may be checked every 5 years, or more frequently if you are over 85 years old. Skin check. Lung cancer screening. You may have this screening every year starting at age 65 if you have a 30-pack-year history of smoking and currently smoke or have quit within the past 15 years. Fecal occult blood test (FOBT) of the stool. You may have this test every year starting at age 46. Flexible sigmoidoscopy or colonoscopy. You may have a sigmoidoscopy every 5 years or a colonoscopy every 10 years starting at age 65. Hepatitis C blood test. Hepatitis B blood test. Sexually transmitted disease (STD) testing. Diabetes screening. This is done by checking your blood sugar (glucose) after you have not eaten for a while (fasting). You may have this done every 1-3 years. Bone density scan. This is done to screen for osteoporosis. You may have this done starting at age 79. Mammogram. This may be done every 1-2 years. Talk to your health care provider about how often you should have regular mammograms. Talk with your health care provider about your test results, treatment options, and if necessary, the need for more tests. Vaccines  Your health care provider may recommend certain vaccines, such as: Influenza vaccine. This is recommended every year. Tetanus, diphtheria, and acellular pertussis (Tdap, Td) vaccine. You may need a Td booster every 10 years. Zoster vaccine. You may need this after age 28. Pneumococcal 13-valent conjugate (PCV13) vaccine. One dose is recommended after age 51. Pneumococcal polysaccharide (PPSV23) vaccine. One dose is recommended after age 73. Talk  to your health care provider  about which screenings and vaccines you need and how often you need them. This information is not intended to replace advice given to you by your health care provider. Make sure you discuss any questions you have with your health care provider. Document Released: 02/07/2015 Document Revised: 10/01/2015 Document Reviewed: 11/12/2014 Elsevier Interactive Patient Education  2017 [place] Prevention in the Home Falls can cause injuries. They can happen to people of all ages. There are many things you can do to make your home safe and to help prevent falls. What can I do on the outside of my home? Regularly fix the edges of walkways and driveways and fix any cracks. Remove anything that might make you trip as you walk through a door, such as a raised step or threshold. Trim any bushes or trees on the path to your home. Use bright outdoor lighting. Clear any walking paths of anything that might make someone trip, such as rocks or tools. Regularly check to see if handrails are loose or broken. Make sure that both sides of any steps have handrails. Any raised decks and porches should have guardrails on the edges. Have any leaves, snow, or ice cleared regularly. Use sand or salt on walking paths during winter. Clean up any spills in your garage right away. This includes oil or grease spills. What can I do in the bathroom? Use night lights. Install grab bars by the toilet and in the tub and shower. Do not use towel bars as grab bars. Use non-skid mats or decals in the tub or shower. If you need to sit down in the shower, use a plastic, non-slip stool. Keep the floor dry. Clean up any water that spills on the floor as soon as it happens. Remove soap buildup in the tub or shower regularly. Attach bath mats securely with double-sided non-slip rug tape. Do not have throw rugs and other things on the floor that can make you trip. What can I do in the bedroom? Use night lights. Make sure  that you have a light by your bed that is easy to reach. Do not use any sheets or blankets that are too big for your bed. They should not hang down onto the floor. Have a firm chair that has side arms. You can use this for support while you get dressed. Do not have throw rugs and other things on the floor that can make you trip. What can I do in the kitchen? Clean up any spills right away. Avoid walking on wet floors. Keep items that you use a lot in easy-to-reach places. If you need to reach something above you, use a strong step stool that has a grab bar. Keep electrical cords out of the way. Do not use floor polish or wax that makes floors slippery. If you must use wax, use non-skid floor wax. Do not have throw rugs and other things on the floor that can make you trip. What can I do with my stairs? Do not leave any items on the stairs. Make sure that there are handrails on both sides of the stairs and use them. Fix handrails that are broken or loose. Make sure that handrails are as long as the stairways. Check any carpeting to make sure that it is firmly attached to the stairs. Fix any carpet that is loose or worn. Avoid having throw rugs at the top or bottom of the stairs. If you do have throw rugs,  attach them to the floor with carpet tape. Make sure that you have a light switch at the top of the stairs and the bottom of the stairs. If you do not have them, ask someone to add them for you. What else can I do to help prevent falls? Wear shoes that: Do not have high heels. Have rubber bottoms. Are comfortable and fit you well. Are closed at the toe. Do not wear sandals. If you use a stepladder: Make sure that it is fully opened. Do not climb a closed stepladder. Make sure that both sides of the stepladder are locked into place. Ask someone to hold it for you, if possible. Clearly mark and make sure that you can see: Any grab bars or handrails. First and last steps. Where the edge of  each step is. Use tools that help you move around (mobility aids) if they are needed. These include: Canes. Walkers. Scooters. Crutches. Turn on the lights when you go into a dark area. Replace any light bulbs as soon as they burn out. Set up your furniture so you have a clear path. Avoid moving your furniture around. If any of your floors are uneven, fix them. If there are any pets around you, be aware of where they are. Review your medicines with your doctor. Some medicines can make you feel dizzy. This can increase your chance of falling. Ask your doctor what other things that you can do to help prevent falls. This information is not intended to replace advice given to you by your health care provider. Make sure you discuss any questions you have with your health care provider. Document Released: 11/07/2008 Document Revised: 06/19/2015 Document Reviewed: 02/15/2014 Elsevier Interactive Patient Education  2017 [employer].

## 2021-12-23 NOTE — Progress Notes (Signed)
Virtual Visit via Telephone Note  I connected with  Sierra Bentley on 12/23/21 at 10:30 AM EST by telephone and verified that I am speaking with the correct person using two identifiers.  Location: Patient: home Provider: [place] Persons participating in the virtual visit: [place]   I discussed the limitations, risks, security and privacy concerns of performing an evaluation and management service by telephone and the availability of in person appointments. The patient expressed understanding and agreed to proceed.  Interactive audio and video telecommunications were attempted between this nurse and patient, however failed, due to patient having technical difficulties OR patient did not have access to video capability.  We continued and completed visit with audio only.  Some vital signs may be absent or patient reported.   Dionisio David, LPN  Subjective:   Sierra Bentley is a 88 y.o. female who presents for Medicare Annual (Subsequent) preventive examination.  Review of Systems     Cardiac Risk Factors include: advanced age (>23mn, >>94women);hypertension;dyslipidemia     Objective:    There were no vitals filed for this visit. There is no height or weight on file to calculate BMI.     12/23/2021   10:40 AM 07/13/2021    3:59 PM 05/11/2021   12:37 PM 04/13/2021    8:13 AM 04/01/2021    1:02 PM 12/16/2020    2:48 PM 11/26/2020    9:44 AM  Advanced Directives  Does Patient Have a Medical Advance Directive? No Yes Yes Yes Yes Yes Yes  Type of AComptrollerLiving will  H[place] will [place] will   Does patient want to make changes to medical advance directive?   No - Patient declined   Yes (MAU/Ambulatory/Procedural Areas - Information given) No - Patient declined  Copy of HVeronain Chart?   No - copy requested      Would patient like  information on creating a medical advance directive? No - Patient declined No - Patient declined  No - Patient declined       Current Medications (verified) Outpatient Encounter Medications as of 12/23/2021  Medication Sig   latanoprost (XALATAN) 0.005 % ophthalmic solution 1 drop at bedtime.   losartan-hydrochlorothiazide (HYZAAR) 50-12.5 MG tablet Take 0.5 tablets by mouth every evening.   Multiple Vitamin (MULTIVITAMIN) tablet Take 1 tablet by mouth daily. Per pt has Postmenopausal multivitamin packet with 5 pills   Polyethyl Glycol-Propyl Glycol (SYSTANE ULTRA OP) Place 1 drop into both eyes 4 (four) times daily.   Probiotic Product (ALIGN EXTRA STRENGTH PO) Take by mouth.   sodium chloride (MURO 128) 5 % ophthalmic ointment Place 1 application into both eyes at bedtime.    sodium chloride (MURO 128) 5 % ophthalmic solution 1 drop as needed for eye irritation.   TURMERIC PO Take 1,300 mg by mouth in the morning and at bedtime.   pantoprazole (PROTONIX) 40 MG tablet Take 1 tablet (40 mg total) by mouth daily as needed. Per pt when needed takes in the evening (Patient not taking: Reported on 12/23/2021)   No facility-administered encounter medications on file as of 12/23/2021.    Allergies (verified) Ace inhibitors, Boniva [ibandronic acid], Chocolate, Fosamax [alendronate], and Oxycodone   History: Past Medical History:  Diagnosis Date   Allergic rhinitis, cause unspecified    Anemia    Breast cancer ([facility]    left breast IDC 2023  Chronically dry eyes, bilateral    Diverticulosis of colon    Endometrial polyp    Family history of prostate cancer 03/18/2021   Fuchs' corneal dystrophy of right eye    GERD (gastroesophageal reflux disease)    Hearing loss of both ears    pt stated has hearing aids but does not wear   History of epistaxis    per pt has had several cautization's for nose bleed   History of radiation therapy    05/20/21-06/10/21- Dr. Gery Pray   HTN  (hypertension)    Hyperlipidemia    Macular degeneration of both eyes    followed '@[facility]  by dr Mamie Nick. mettu;   both age-related and right is with active choroidal neovascularization   OA (osteoarthritis)    OAB (overactive bladder)    Osteopenia    Thickened endometrium    Urge incontinence of urine    Wears glasses    Past Surgical History:  Procedure Laterality Date   BREAST BIOPSY Left 2011   fibrocystic change   BREAST LUMPECTOMY WITH RADIOACTIVE SEED LOCALIZATION Left 04/13/2021   Procedure: LEFT BREAST LUMPECTOMY WITH RADIOACTIVE SEED LOCALIZATION;  Surgeon: Rolm Bookbinder, MD;  Location: [facility];  Service: General;  Laterality: Left;   CATARACT EXTRACTION W/ INTRAOCULAR LENS IMPLANT Bilateral 2008   COLONOSCOPY  2013   DILATATION & CURETTAGE/HYSTEROSCOPY WITH MYOSURE N/A 11/26/2020   Procedure: [place];  Surgeon: Princess Bruins, MD;  Location: [facility];  Service: Gynecology;  Laterality: N/A;   TONSILLECTOMY  1962   Family History  Problem Relation Age of Onset   Other Mother        Arrythmia   Coronary artery disease Mother    Hypertension Mother    Anxiety disorder Mother    Heart attack Father 1   Coronary artery disease Father    Prostate cancer Father        dx unknown age   Throat cancer Brother        d. 45   Cancer Other        two maternal female cousins; unknown cancer; dx after 44   Depression Other        family history   Social History   Socioeconomic History   Marital status: Widowed    Spouse name: Not on file   Number of children: Not on file   Years of education: Not on file   Highest education level: Not on file  Occupational History   Not on file  Tobacco Use   Smoking status: Never    Passive exposure: Never   Smokeless tobacco: Never  Vaping Use   Vaping Use: Never used  Substance and Sexual Activity   Alcohol use: No    Alcohol/week: 0.0 standard  drinks of alcohol   Drug use: Never   Sexual activity: Not Currently    Partners: Male    Birth control/protection: Post-menopausal  Other Topics Concern   Not on file  Social History Narrative   Married      No biological children (Has step children)      Taught HS x 30 years      Regular exercise         Social Determinants of Health   Financial Resource Strain: Low Risk  (12/23/2021)   Overall Financial Resource Strain (CARDIA)    Difficulty of Paying Living Expenses: Not hard at all  Food Insecurity: No Food Insecurity (12/23/2021)   Hunger  Vital Sign    Worried About Charity fundraiser in the Last Year: Never true    Ran Out of Food in the Last Year: Never true  Transportation Needs: No Transportation Needs (12/23/2021)   PRAPARE - Hydrologist (Medical): No    Lack of Transportation (Non-Medical): No  Physical Activity: Inactive (12/23/2021)   Exercise Vital Sign    Days of Exercise per Week: 0 days    Minutes of Exercise per Session: 0 min  Stress: No Stress Concern Present (12/23/2021)   [place]    Feeling of Stress : Only a little  Social Connections: Moderately Isolated (12/23/2021)   Social Connection and Isolation Panel [NHANES]    Frequency of Communication with Friends and Family: Three times a week    Frequency of Social Gatherings with Friends and Family: Three times a week    Attends Religious Services: More than 4 times per year    Active Member of Clubs or Organizations: No    Attends Archivist Meetings: Never    Marital Status: Widowed    Tobacco Counseling Counseling given: Not Answered   Clinical Intake:  Pre-visit preparation completed: Yes  Pain : No/denies pain     Nutritional Risks: None Diabetes: No  How often do you need to have someone help you when you read instructions, pamphlets, or other written materials from your  doctor or pharmacy?: 1 - Never  Diabetic?no  Interpreter Needed?: No  Information entered by :: Kirke Shaggy, LPN   Activities of Daily Living    12/23/2021   10:41 AM 04/13/2021    8:16 AM  In your present state of health, do you have any difficulty performing the following activities:  Hearing? 1 0  Vision? 0 0  Difficulty concentrating or making decisions? 0 0  Walking or climbing stairs? 0 0  Dressing or bathing? 0 0  Doing errands, shopping? 0   Preparing Food and eating ? N   Using the Toilet? N   In the past six months, have you accidently leaked urine? N   Do you have problems with loss of bowel control? N   Managing your Medications? N   Managing your Finances? N   Housekeeping or managing your Housekeeping? N     Patient Care Team: Tower, [name], MD as PCP - General Beverly Gust, MD as Referring Physician (Otolaryngology) Shirlee More, MD as Referring Physician (Ophthalmology) Rolm Bookbinder, MD as Consulting Physician (General Surgery) Nicholas Lose, MD as Consulting Physician (Hematology and Oncology) Gery Pray, MD as Consulting Physician (Radiation Oncology)  Indicate any recent Medical Services you may have received from other than [facility] providers in the past year (date may be approximate).     Assessment:   This is a routine wellness examination for [place].  Hearing/Vision screen Hearing Screening - Comments:: Wears aids Vision Screening - Comments:: Wears glasses- [facility]  Dietary issues and exercise activities discussed: Current Exercise Habits: The patient does not participate in regular exercise at present   Goals Addressed             This Visit's Progress    DIET - EAT MORE FRUITS AND VEGETABLES         Depression Screen    12/23/2021   10:37 AM 12/16/2020    2:51 PM 12/11/2019    3:38 PM 12/08/2018    9:37 AM 11/29/2017   10:06  AM 11/26/2016   11:34 AM 08/29/2015    8:38 AM  PHQ 2/9 Scores  PHQ  - 2 Score 0 0 0 0 1 0 0  PHQ- 9 Score 0  0  2 0     Fall Risk    12/23/2021   10:41 AM 12/16/2020    2:50 PM 12/11/2019    3:37 PM 12/08/2018    9:37 AM 08/23/2018    9:50 AM  Fall Risk   Falls in the past year? '1 1 1 1 1  '$ Number falls in past yr: 0 0 0 1 1  Injury with Fall? 0 0 0 1 1  Risk for fall due to : History of fall(s) Impaired balance/gait Medication side effect    Follow up Falls prevention discussed;Falls evaluation completed Falls prevention discussed Falls evaluation completed;Falls prevention discussed Falls evaluation completed     FALL RISK PREVENTION PERTAINING TO THE HOME:  Any stairs in or around the home? Yes  If so, are there any without handrails? No  Home free of loose throw rugs in walkways, pet beds, electrical cords, etc? Yes  Adequate lighting in your home to reduce risk of falls? Yes   ASSISTIVE DEVICES UTILIZED TO PREVENT FALLS:  Life alert? No  Use of a cane, walker or w/c? Yes  Grab bars in the bathroom? Yes  Shower chair or bench in shower? Yes  Elevated toilet seat or a handicapped toilet? No    Cognitive Function:    12/11/2019    3:42 PM 11/29/2017   10:06 AM 11/26/2016   11:32 AM 08/29/2015    8:50 AM  MMSE - Mini Mental State Exam  Orientation to time '5 5 5 5  '$ Orientation to Place '5 5 5 5  '$ Registration '3 3 3 3  '$ Attention/ Calculation 5 0 0 0  Recall '3 3 3 2  '$ Recall-comments    pt was unable to recall 1 of 3 words  Language- name 2 objects  0 0 0  Language- repeat '1 1 1 1  '$ Language- follow 3 step command  '3 3 3  '$ Language- read & follow direction  0 0 0  Write a sentence  0 0 0  Copy design  0 0 0  Total score  '20 20 19        '$ 12/23/2021   10:44 AM  6CIT Screen  What Year? 0 points  What month? 0 points  What time? 0 points  Count back from 20 0 points  Months in reverse 0 points  Repeat phrase 0 points  Total Score 0 points    Immunizations Immunization History  Administered Date(s) Administered   Fluad  Quad(high Dose 65+) 11/14/2018, 12/17/2019, 10/30/2021   Influenza Split 11/30/2010, 12/16/2011   Influenza Whole 10/26/2007   Influenza,inj,Quad PF,6+ Mos 11/09/2012, 11/16/2013, 01/03/2015, 11/06/2015, 11/26/2016, 12/02/2017   Influenza-Unspecified 12/31/2020   Moderna Sars-Covid-2 Vaccination 03/13/2019, 04/10/2019   Pneumococcal Conjugate-13 07/25/2013   Pneumococcal Polysaccharide-23 11/30/2010   Td 01/25/2005, 05/17/2016   Zoster Recombinat (Shingrix) 09/03/2020   Zoster, Live 03/29/2008    TDAP status: Up to date  Flu Vaccine status: Up to date  Pneumococcal vaccine status: Up to date  Covid-19 vaccine status: Completed vaccines  Qualifies for Shingles Vaccine? Yes   Zostavax completed Yes   Shingrix Completed?: No.    Education has been provided regarding the importance of this vaccine. Patient has been advised to call insurance company to determine out of pocket expense if they have not  yet received this vaccine. Advised may also receive vaccine at local pharmacy or Health Dept. Verbalized acceptance and understanding.  Screening Tests Health Maintenance  Topic Date Due   COVID-19 Vaccine (3 - Moderna risk series) 05/08/2019   Zoster Vaccines- Shingrix (2 of 2) 10/29/2020   MAMMOGRAM  03/10/2022   Medicare Annual Wellness (AWV)  12/24/2022   Pneumonia Vaccine 41+ Years old  Completed   INFLUENZA VACCINE  Completed   DEXA SCAN  Completed   HPV VACCINES  Aged Out    Health Maintenance  Health Maintenance Due  Topic Date Due   COVID-19 Vaccine (3 - Moderna risk series) 05/08/2019   Zoster Vaccines- Shingrix (2 of 2) 10/29/2020    Colorectal cancer screening: No longer required.   Mammogram status: No longer required due to age.  Bone Density status: Completed 10/28/20. Results reflect: Bone density results: OSTEOPOROSIS. Repeat every 2 years.  Lung Cancer Screening: (Low Dose CT Chest recommended if Age 37-80 years, 30 pack-year currently smoking OR have quit  w/in 15years.) does not qualify.    Additional Screening:  Hepatitis C Screening: does not qualify; Completed no  Vision Screening: Recommended annual ophthalmology exams for early detection of glaucoma and other disorders of the eye. Is the patient up to date with their annual eye exam?  Yes  Who is the provider or what is the name of the office in which the patient attends annual eye exams? [facility] If pt is not established with a provider, would they like to be referred to a provider to establish care? No .   Dental Screening: Recommended annual dental exams for proper oral hygiene  Community Resource Referral / Chronic Care Management: CRR required this visit?  No   CCM required this visit?  No      Plan:     I have personally reviewed and noted the following in the patient's chart:   Medical and social history Use of alcohol, tobacco or illicit drugs  Current medications and supplements including opioid prescriptions. Patient is not currently taking opioid prescriptions. Functional ability and status Nutritional status Physical activity Advanced directives List of other physicians Hospitalizations, surgeries, and ER visits in previous 12 months Vitals Screenings to include cognitive, depression, and falls Referrals and appointments  In addition, I have reviewed and discussed with patient certain preventive protocols, quality metrics, and best practice recommendations. A written personalized care plan for preventive services as well as general preventive health recommendations were provided to patient.     Dionisio David, LPN   [id number]   Nurse Notes: none

## 2021-12-29 DIAGNOSIS — S93401A Sprain of unspecified ligament of right ankle, initial encounter: Secondary | ICD-10-CM | POA: Diagnosis not present

## 2021-12-29 DIAGNOSIS — M25571 Pain in right ankle and joints of right foot: Secondary | ICD-10-CM | POA: Diagnosis not present

## 2022-01-06 IMAGING — MR MR BRAIN/IAC WO/W CM
11 of 15 series · 29 of 48 positions shown · IV contrast (gadavist)
Comparison: MRI head 05/29/2009

CLINICAL DATA: Frequent headaches. Chronic dizziness. Bilateral
hearing loss

EXAM:
MRI HEAD WITHOUT AND WITH CONTRAST
TECHNIQUE: Multiplanar, multiecho pulse sequences of the brain and surrounding
structures were obtained without and with intravenous contrast.
CONTRAST:  5mL GADAVIST GADOBUTROL 1 MMOL/ML IV SOLN

[Series 5: T1 · sagittal · 5.0mm · 0.62mm/px · 1 of 25 slices shown (1 of 3)]
[im 1/25]
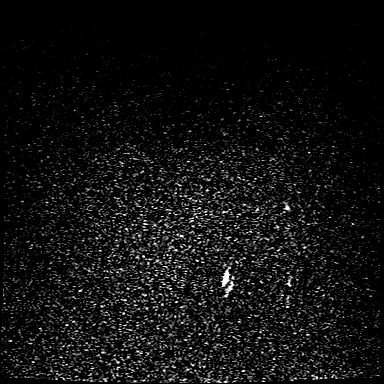

[Series 6: ax dwi_tracew · axial · 3.0mm · 0.60mm/px · z∈[-74,+80]mm · 2 of 48 slices shown]
[im 1/48]
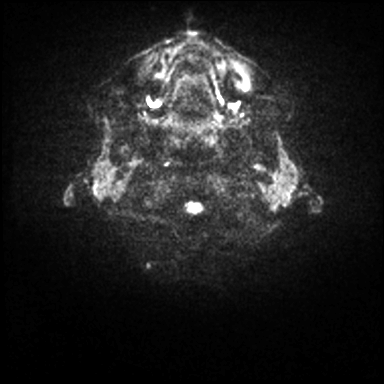
[im 48/48]
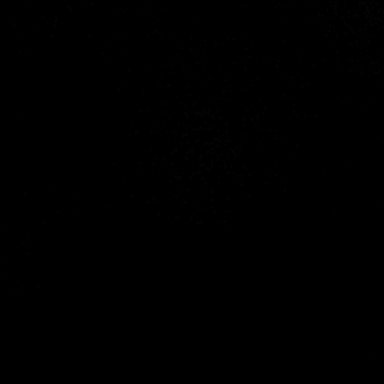

[Series 7: ax dwi_adc · axial · 3.0mm · 0.60mm/px · z∈[-74,+73]mm · 3 of 46 slices shown]
[im 1/46]
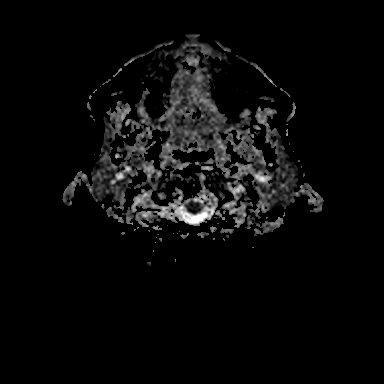
[im 23/46]
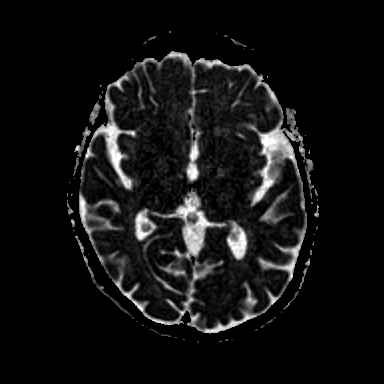
[im 46/46]
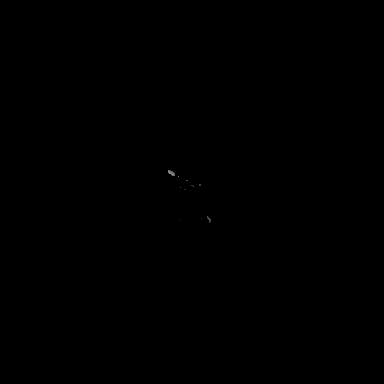

[Series 8: T2 · axial · 5.0mm · 0.53mm/px · z∈[-68,+75]mm · 2 of 25 slices shown]
[im 1/25]
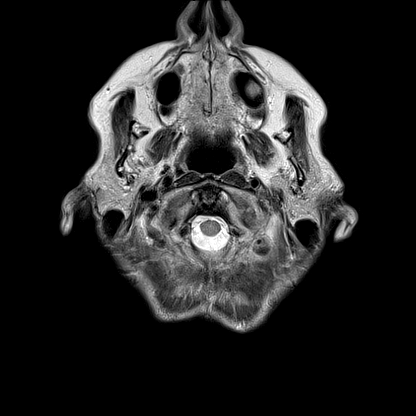
[im 25/25]
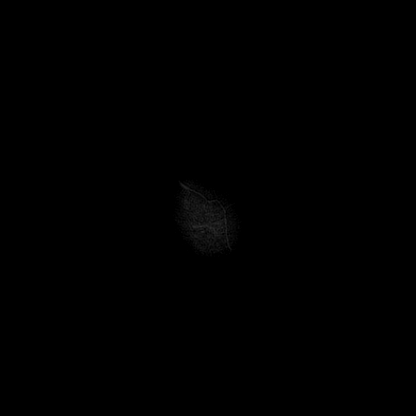

[Series 9: mag_images · axial · 3.0mm · 0.90mm/px · z∈[-85,+91]mm · 4 of 60 slices shown]
[im 1/60]
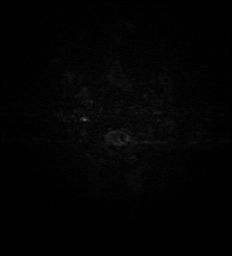
[im 20/60]
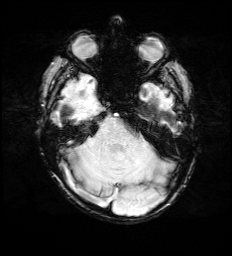
[im 40/60]
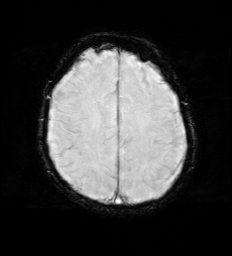
[im 60/60]
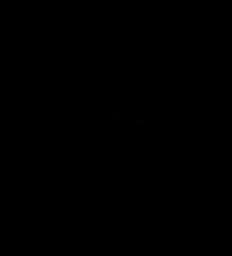

[Series 13: FLAIR · axial · 3.0mm · 0.53mm/px · z∈[-77,+84]mm · 4 of 55 slices shown]
[im 1/55]
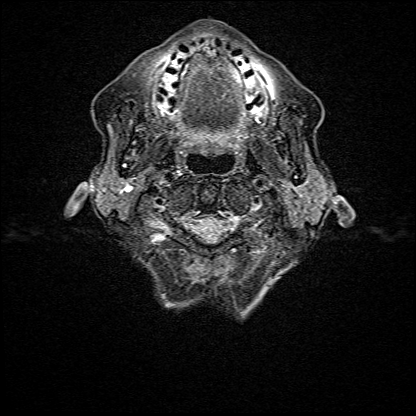
[im 19/55]
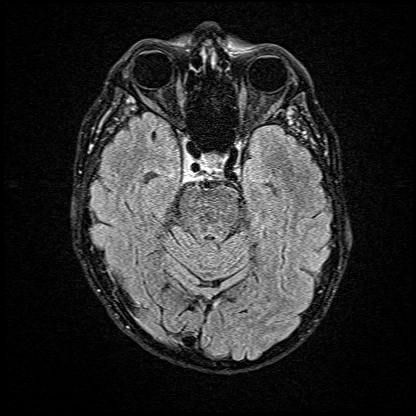
[im 37/55]
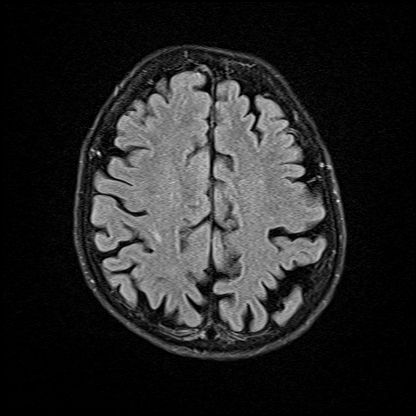
[im 55/55]
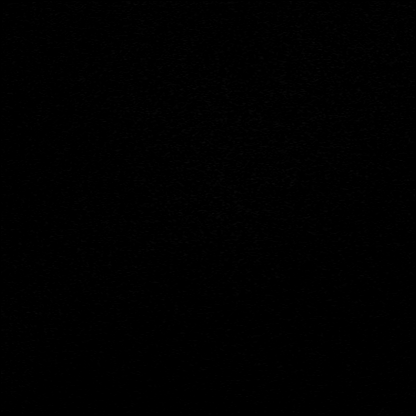

[Series 15: T1 · axial · non-contrast · 3.0mm · 0.21mm/px · 1 of 15 slices shown (2 of 3)]
[im 1/15]
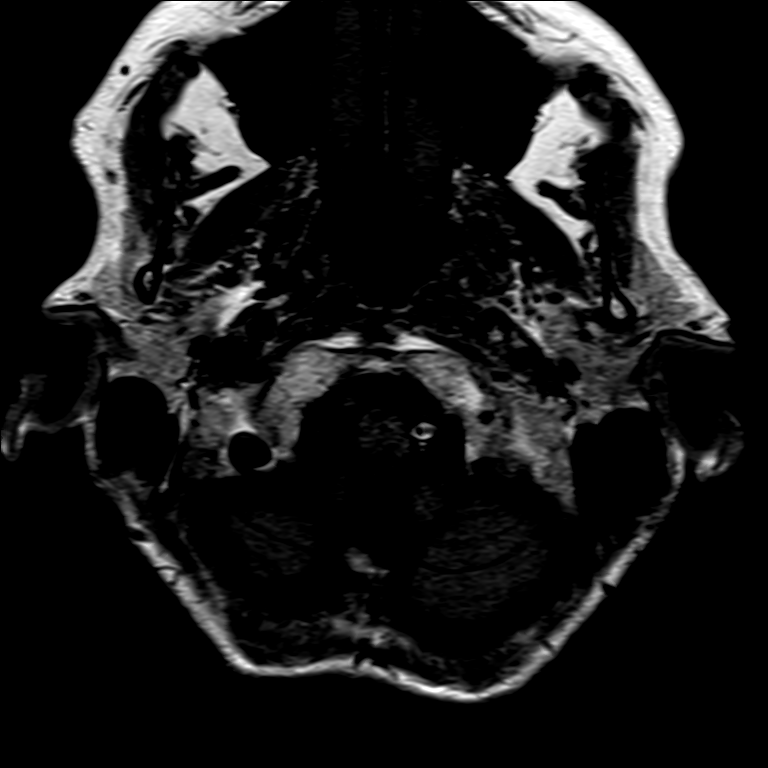

[Series 16: T1 · coronal · non-contrast · 3.0mm · 0.21mm/px · 1 of 13 slices shown (3 of 3)]
[im 1/13]
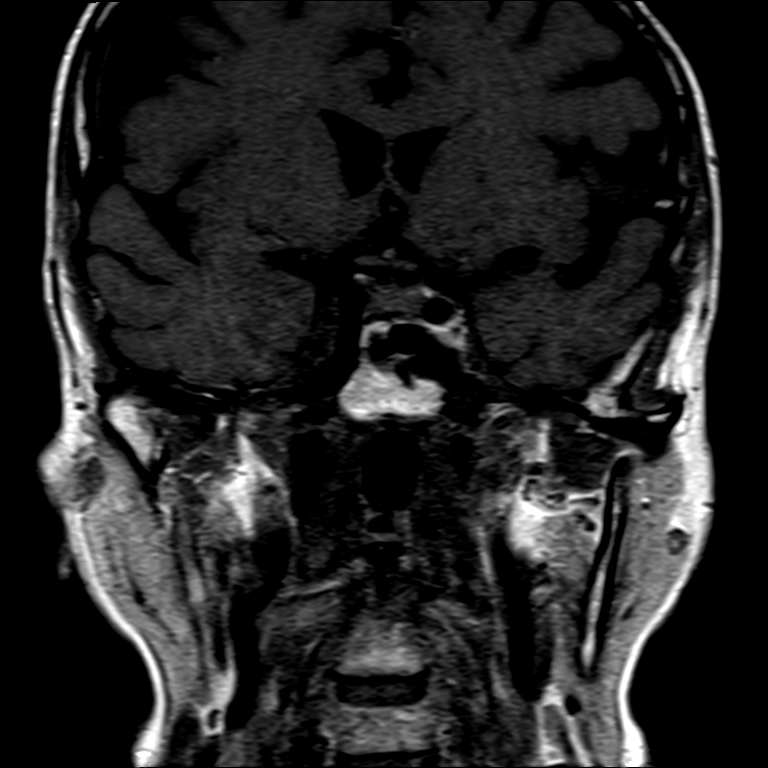

[Series 17: T1 post-contrast · axial · 3.0mm · 0.21mm/px · 1 of 15 slices shown (1 of 3)]
[im 1/15]
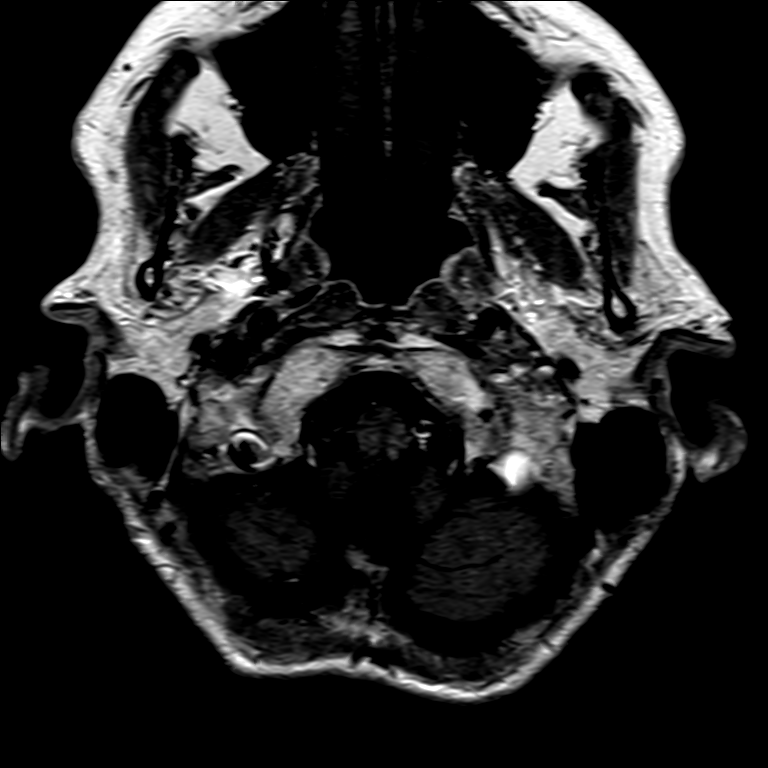

[Series 18: T1 post-contrast · coronal · 3.0mm · 0.21mm/px · 1 of 13 slices shown (2 of 3)]
[im 1/13]
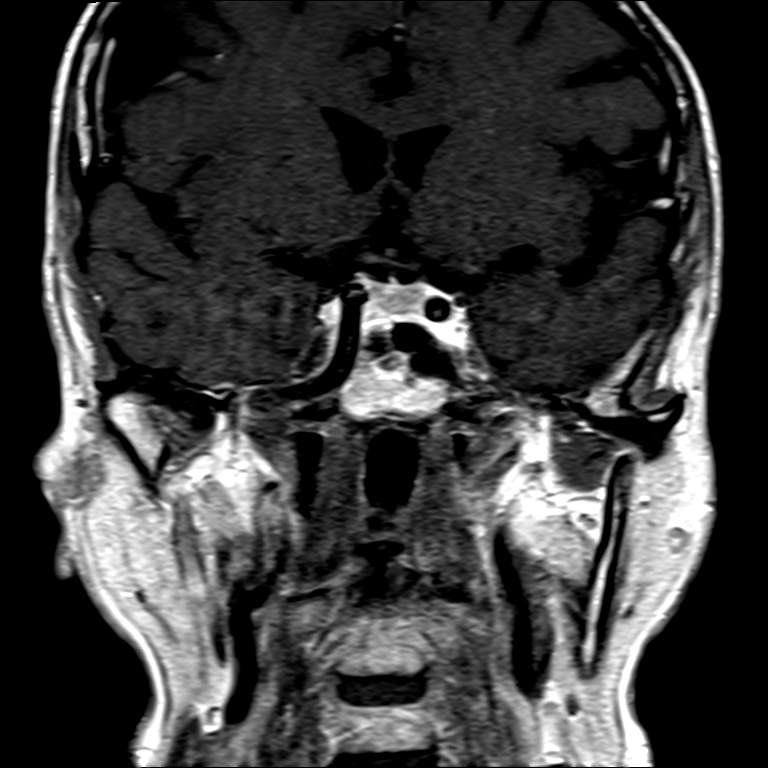

[Series 19: T1 post-contrast · axial · 1.0mm · 0.98mm/px · z∈[-85,+89]mm · 9 of 176 slices shown (3 of 3)]
[im 1/176]
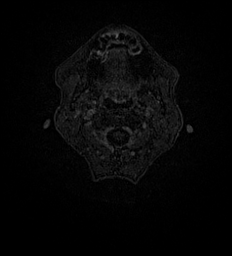
[im 32/176]
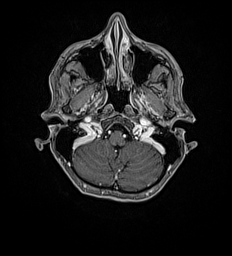
[im 48/176]
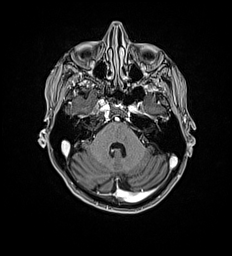
[im 80/176]
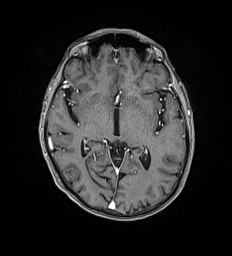
[im 96/176]
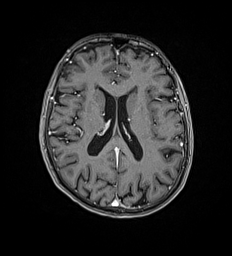
[im 128/176]
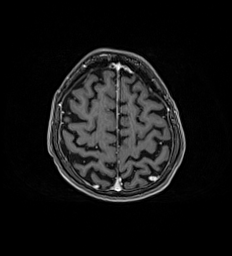
[im 144/176]
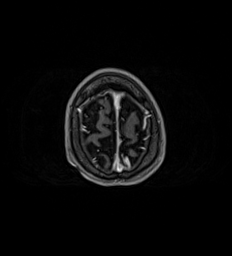
[im 160/176]
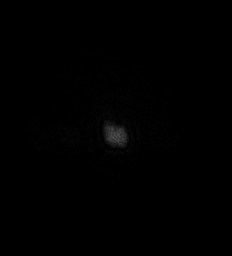
[im 176/176]
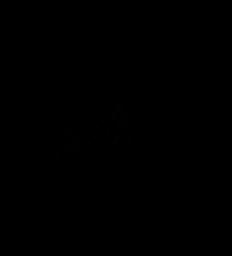

[29 of 48 positions shown; findings below may reference images not displayed]

FINDINGS: Brain: IAC protocol. Negative for vestibular schwannoma. Seventh and
eighth cranial nerve normal bilaterally. Mastoid sinus clear
bilaterally. No mass in the posterior fossa. Normal enhancement in
the temporal bone bilaterally.

Ventricle size and cerebral volume normal for age. Very mild white
matter changes with few small white matter hyperintensities, less
than typically seen for age.

Vascular: Normal arterial flow voids at the skull base.

Skull and upper cervical spine: No focal skeletal abnormality

Sinuses/Orbits: Paranasal sinuses clear. Bilateral cataract
extraction

Other: None
IMPRESSION: 1. No acute intracranial abnormality. Mild white matter changes most
likely due to chronic microvascular ischemia.
2. No cause for hearing loss.  Negative for vestibular schwannoma.

## 2022-02-10 DIAGNOSIS — H353231 Exudative age-related macular degeneration, bilateral, with active choroidal neovascularization: Secondary | ICD-10-CM | POA: Diagnosis not present

## 2022-02-15 ENCOUNTER — Inpatient Hospital Stay: Payer: Medicare PPO | Attending: Hematology and Oncology | Admitting: Hematology and Oncology

## 2022-02-15 VITALS — BP 133/55 | HR 79 | Temp 98.1°F | Resp 18 | Ht 63.25 in | Wt 116.0 lb

## 2022-02-15 DIAGNOSIS — Z923 Personal history of irradiation: Secondary | ICD-10-CM | POA: Insufficient documentation

## 2022-02-15 DIAGNOSIS — C50412 Malignant neoplasm of upper-outer quadrant of left female breast: Secondary | ICD-10-CM | POA: Diagnosis not present

## 2022-02-15 DIAGNOSIS — Z171 Estrogen receptor negative status [ER-]: Secondary | ICD-10-CM | POA: Diagnosis not present

## 2022-02-15 DIAGNOSIS — Z79899 Other long term (current) drug therapy: Secondary | ICD-10-CM | POA: Insufficient documentation

## 2022-02-15 NOTE — Progress Notes (Signed)
Patient Care Team: Tower, [name], MD as PCP - General Beverly Gust, MD as Referring Physician (Otolaryngology) Shirlee More, MD as Referring Physician (Ophthalmology) Rolm Bookbinder, MD as Consulting Physician (General Surgery) Nicholas Lose, MD as Consulting Physician (Hematology and Oncology) Sierra Pray, MD as Consulting Physician (Radiation Oncology)  DIAGNOSIS:  Encounter Diagnosis  Name Primary?   Malignant neoplasm of upper-outer quadrant of left breast in female, estrogen receptor negative ([place]) Yes    SUMMARY OF ONCOLOGIC HISTORY: Oncology History  Malignant neoplasm of upper-outer quadrant of left breast in female, estrogen receptor negative ([place])  03/10/2021 Initial Diagnosis   Screening mammogram detected left breast mass at 12 o'clock position: 1.6 cm, by ultrasound retroareolar mass 3.5 x 1.4 x 2.2 cm, additional mass 0.8 cm: Benign on biopsy, axilla negative   03/18/2021 Cancer Staging   Staging form: Breast, AJCC 8th Edition - Clinical stage from 03/18/2021: Stage IIB (cT2, cN0, cM0, G2, ER-, PR-, HER2-) - Signed by Nicholas Lose, MD on 03/18/2021 Stage prefix: Initial diagnosis Histologic grading system: 3 grade system   04/13/2021 Surgery   Left lumpectomy: Grade 2 invasive carcinoma with apocrine features, DCIS grade 2 with necrosis and microcalcifications, margins negative, ER 0%, PR 0%, HER2 negative, Ki-67 5% (lymph nodes not sampled)   05/20/2021 - 06/10/2021 Radiation Therapy   Site Technique Total Dose (Gy) Dose per Fx (Gy) Completed Fx Beam Energies  Breast, Left: Breast_L 3D 42.72/42.72 2.67 16/16 6XFFF       CHIEF COMPLIANT: Surveillance  INTERVAL HISTORY: Sierra Bentley is a 89 y.o. female is here because of recent diagnosis of invasive ductal carcinoma of the left breast. She presents to the clinic for a follow-up. Pretty good. states that she has been doing. She does get a little tired easily. She says she has some interrupted  sleep. She says she not able to go for walks often.    ALLERGIES:  is allergic to ace inhibitors, boniva [ibandronic acid], chocolate, fosamax [alendronate], and oxycodone.  MEDICATIONS:  Current Outpatient Medications  Medication Sig Dispense Refill   latanoprost (XALATAN) 0.005 % ophthalmic solution 1 drop at bedtime.     losartan-hydrochlorothiazide (HYZAAR) 50-12.5 MG tablet Take 0.5 tablets by mouth every evening. 45 tablet 3   Multiple Vitamin (MULTIVITAMIN) tablet Take 1 tablet by mouth daily. Per pt has Postmenopausal multivitamin packet with 5 pills     pantoprazole (PROTONIX) 40 MG tablet Take 1 tablet (40 mg total) by mouth daily as needed. Per pt when needed takes in the evening (Patient not taking: Reported on 12/23/2021) 90 tablet 3   Polyethyl Glycol-Propyl Glycol (SYSTANE ULTRA OP) Place 1 drop into both eyes 4 (four) times daily.     Probiotic Product (ALIGN EXTRA STRENGTH PO) Take by mouth.     sodium chloride (MURO 128) 5 % ophthalmic ointment Place 1 application into both eyes at bedtime.      sodium chloride (MURO 128) 5 % ophthalmic solution 1 drop as needed for eye irritation.     TURMERIC PO Take 1,300 mg by mouth in the morning and at bedtime.     No current facility-administered medications for this visit.    PHYSICAL EXAMINATION: ECOG PERFORMANCE STATUS: 1 - Symptomatic but completely ambulatory  Vitals:   02/15/22 1511  BP: (!) 133/55  Pulse: 79  Resp: 18  Temp: 98.1 F (36.7 C)  SpO2: 100%   Filed Weights   02/15/22 1511  Weight: 116 lb (52.6 kg)    BREAST: No  palpable masses or nodules in either right or left breasts. No palpable axillary supraclavicular or infraclavicular adenopathy no breast tenderness or nipple discharge. (exam performed in the presence of a chaperone)  LABORATORY DATA:  I have reviewed the data as listed    Latest Ref Rng & Units 04/03/2021    9:57 AM 03/18/2021   12:13 PM 11/24/2020    1:21 PM  CMP  Glucose 70 - 99  mg/dL 86  141  89   BUN 8 - 23 mg/dL '19  16  19   '$ Creatinine 0.44 - 1.00 mg/dL 0.65  0.66  0.63   Sodium 135 - 145 mmol/L 139  140  139   Potassium 3.5 - 5.1 mmol/L 4.3  3.7  4.2   Chloride 98 - 111 mmol/L 103  103  104   CO2 22 - 32 mmol/L 28  34  29   Calcium 8.9 - 10.3 mg/dL 9.4  9.5  9.2   Total Protein 6.5 - 8.1 g/dL  6.4    Total Bilirubin 0.3 - 1.2 mg/dL  0.5    Alkaline Phos 38 - 126 U/L  43    AST 15 - 41 U/L  20    ALT 0 - 44 U/L  20      Lab Results  Component Value Date   WBC 5.3 03/18/2021   HGB 12.1 03/18/2021   HCT 37.7 03/18/2021   MCV 87.1 03/18/2021   PLT 244 03/18/2021   NEUTROABS 3.8 03/18/2021    ASSESSMENT & PLAN:  Malignant neoplasm of upper-outer quadrant of left breast in female, estrogen receptor negative ([place]) 04/13/2021:Left lumpectomy: Grade 2 invasive carcinoma with apocrine features, DCIS grade 2 with necrosis and microcalcifications, margins negative, ER 0%, PR 0%, HER2 negative, Ki-67 5% (lymph nodes not sampled)   Treatment plan: Adjuvant radiation followed by surveillance  Breast cancer surveillance: Breast exam 02/15/2022: Benign Mammogram 08/20/2021: Benign, there is a 6 mm oval mass circumscribed right breast 6:00 retroareolar (correlates to the palpable finding) this is a cyst and felt to be benign  Return to clinic in 1 year for follow-up    No orders of the defined types were placed in this encounter.  The patient has a good understanding of the overall plan. she agrees with it. she will call with any problems that may develop before the next visit here. Total time spent: 30 mins including face to face time and time spent for planning, charting and co-ordination of care   Harriette Ohara, MD 02/15/22    I Gardiner Coins am acting as a Education administrator for [employer]  I have reviewed the above documentation for accuracy and completeness, and I agree with the above.

## 2022-02-15 NOTE — Assessment & Plan Note (Signed)
04/13/2021:Left lumpectomy: Grade 2 invasive carcinoma with apocrine features, DCIS grade 2 with necrosis and microcalcifications, margins negative, ER 0%, PR 0%, HER2 negative, Ki-67 5% (lymph nodes not sampled)   Treatment plan: Adjuvant radiation followed by surveillance  Breast cancer surveillance: Breast exam 02/15/2022: Benign Mammogram 08/20/2021: Benign, there is a 6 mm oval mass circumscribed right breast 6:00 retroareolar (correlates to the palpable finding) this is a cyst and felt to be benign  Return to clinic in 1 year for follow-up

## 2022-03-19 DIAGNOSIS — H353123 Nonexudative age-related macular degeneration, left eye, advanced atrophic without subfoveal involvement: Secondary | ICD-10-CM | POA: Diagnosis not present

## 2022-03-19 DIAGNOSIS — H18519 Endothelial corneal dystrophy, unspecified eye: Secondary | ICD-10-CM | POA: Diagnosis not present

## 2022-03-19 DIAGNOSIS — H353231 Exudative age-related macular degeneration, bilateral, with active choroidal neovascularization: Secondary | ICD-10-CM | POA: Diagnosis not present

## 2022-03-19 DIAGNOSIS — H40003 Preglaucoma, unspecified, bilateral: Secondary | ICD-10-CM | POA: Diagnosis not present

## 2022-03-31 DIAGNOSIS — M79675 Pain in left toe(s): Secondary | ICD-10-CM | POA: Diagnosis not present

## 2022-03-31 DIAGNOSIS — B351 Tinea unguium: Secondary | ICD-10-CM | POA: Diagnosis not present

## 2022-03-31 DIAGNOSIS — M79674 Pain in right toe(s): Secondary | ICD-10-CM | POA: Diagnosis not present

## 2022-04-09 DIAGNOSIS — H18513 Endothelial corneal dystrophy, bilateral: Secondary | ICD-10-CM | POA: Diagnosis not present

## 2022-04-09 DIAGNOSIS — H353231 Exudative age-related macular degeneration, bilateral, with active choroidal neovascularization: Secondary | ICD-10-CM | POA: Diagnosis not present

## 2022-04-09 DIAGNOSIS — H524 Presbyopia: Secondary | ICD-10-CM | POA: Diagnosis not present

## 2022-04-12 ENCOUNTER — Telehealth: Payer: Self-pay | Admitting: Family Medicine

## 2022-04-12 DIAGNOSIS — Z79899 Other long term (current) drug therapy: Secondary | ICD-10-CM

## 2022-04-12 DIAGNOSIS — E7800 Pure hypercholesterolemia, unspecified: Secondary | ICD-10-CM

## 2022-04-12 DIAGNOSIS — R7303 Prediabetes: Secondary | ICD-10-CM

## 2022-04-12 DIAGNOSIS — M810 Age-related osteoporosis without current pathological fracture: Secondary | ICD-10-CM

## 2022-04-12 DIAGNOSIS — I10 Essential (primary) hypertension: Secondary | ICD-10-CM

## 2022-04-12 NOTE — Telephone Encounter (Signed)
-----   Message from Velna Hatchet, RT sent at 03/29/2022  1:57 PM EST ----- Regarding: Tue 3/19 lab Patient is scheduled for cpx, please order future labs.  Thanks, Anda Kraft

## 2022-04-13 ENCOUNTER — Other Ambulatory Visit (INDEPENDENT_AMBULATORY_CARE_PROVIDER_SITE_OTHER): Payer: Medicare PPO

## 2022-04-13 DIAGNOSIS — I10 Essential (primary) hypertension: Secondary | ICD-10-CM | POA: Diagnosis not present

## 2022-04-13 DIAGNOSIS — R7303 Prediabetes: Secondary | ICD-10-CM

## 2022-04-13 DIAGNOSIS — E7800 Pure hypercholesterolemia, unspecified: Secondary | ICD-10-CM | POA: Diagnosis not present

## 2022-04-13 DIAGNOSIS — Z79899 Other long term (current) drug therapy: Secondary | ICD-10-CM | POA: Diagnosis not present

## 2022-04-13 DIAGNOSIS — M810 Age-related osteoporosis without current pathological fracture: Secondary | ICD-10-CM | POA: Diagnosis not present

## 2022-04-13 LAB — LIPID PANEL
Cholesterol: 199 mg/dL (ref 0–200)
HDL: 72.9 mg/dL (ref >39.00)
LDL Cholesterol: 111 mg/dL — ABNORMAL HIGH (ref 0–99)
NonHDL: 126.27
Total CHOL/HDL Ratio: 3
Triglycerides: 78 mg/dL (ref 0.0–149.0)
VLDL: 15.6 mg/dL (ref 0.0–40.0)

## 2022-04-13 LAB — CBC WITH DIFFERENTIAL/PLATELET
Basophils Absolute: 0.1 10*3/uL (ref 0.0–0.1)
Basophils Relative: 1.1 % (ref 0.0–3.0)
Eosinophils Absolute: 0.1 10*3/uL (ref 0.0–0.7)
Eosinophils Relative: 2 % (ref 0.0–5.0)
HCT: 40.3 % (ref 36.0–46.0)
Hemoglobin: 13.3 g/dL (ref 12.0–15.0)
Lymphocytes Relative: 22.5 % (ref 12.0–46.0)
Lymphs Abs: 1.1 10*3/uL (ref 0.7–4.0)
MCHC: 32.9 g/dL (ref 30.0–36.0)
MCV: 85.4 fl (ref 78.0–100.0)
Monocytes Absolute: 0.3 10*3/uL (ref 0.1–1.0)
Monocytes Relative: 6.3 % (ref 3.0–12.0)
Neutro Abs: 3.3 10*3/uL (ref 1.4–7.7)
Neutrophils Relative %: 68.1 % (ref 43.0–77.0)
Platelets: 240 10*3/uL (ref 150.0–400.0)
RBC: 4.71 Mil/uL (ref 3.87–5.11)
RDW: 14.3 % (ref 11.5–15.5)
WBC: 4.9 10*3/uL (ref 4.0–10.5)

## 2022-04-13 LAB — COMPREHENSIVE METABOLIC PANEL
ALT: 15 U/L (ref 0–35)
AST: 20 U/L (ref 0–37)
Albumin: 4.2 g/dL (ref 3.5–5.2)
Alkaline Phosphatase: 56 U/L (ref 39–117)
BUN: 14 mg/dL (ref 6–23)
CO2: 31 mEq/L (ref 19–32)
Calcium: 9.9 mg/dL (ref 8.4–10.5)
Chloride: 100 mEq/L (ref 96–112)
Creatinine, Ser: 0.64 mg/dL (ref 0.40–1.20)
GFR: 80.08 mL/min (ref >60.00)
Glucose, Bld: 88 mg/dL (ref 70–99)
Potassium: 4.1 mEq/L (ref 3.5–5.1)
Sodium: 140 mEq/L (ref 135–145)
Total Bilirubin: 0.5 mg/dL (ref 0.2–1.2)
Total Protein: 6.8 g/dL (ref 6.0–8.3)

## 2022-04-13 LAB — TSH: TSH: 2.33 u[IU]/mL (ref 0.35–5.50)

## 2022-04-13 LAB — VITAMIN D 25 HYDROXY (VIT D DEFICIENCY, FRACTURES): VITD: 35.24 ng/mL (ref 30.00–100.00)

## 2022-04-13 LAB — VITAMIN B12: Vitamin B-12: 824 pg/mL (ref 211–911)

## 2022-04-13 LAB — HEMOGLOBIN A1C: Hgb A1c MFr Bld: 5.6 % (ref 4.6–6.5)

## 2022-04-20 ENCOUNTER — Encounter: Payer: Self-pay | Admitting: Family Medicine

## 2022-04-20 ENCOUNTER — Ambulatory Visit (INDEPENDENT_AMBULATORY_CARE_PROVIDER_SITE_OTHER): Payer: Medicare PPO | Admitting: Family Medicine

## 2022-04-20 VITALS — BP 132/66 | HR 79 | Temp 97.9°F | Ht 63.25 in | Wt 118.2 lb

## 2022-04-20 DIAGNOSIS — E7800 Pure hypercholesterolemia, unspecified: Secondary | ICD-10-CM | POA: Diagnosis not present

## 2022-04-20 DIAGNOSIS — R7303 Prediabetes: Secondary | ICD-10-CM

## 2022-04-20 DIAGNOSIS — I83893 Varicose veins of bilateral lower extremities with other complications: Secondary | ICD-10-CM

## 2022-04-20 DIAGNOSIS — M810 Age-related osteoporosis without current pathological fracture: Secondary | ICD-10-CM

## 2022-04-20 DIAGNOSIS — Z79899 Other long term (current) drug therapy: Secondary | ICD-10-CM

## 2022-04-20 DIAGNOSIS — J301 Allergic rhinitis due to pollen: Secondary | ICD-10-CM

## 2022-04-20 DIAGNOSIS — Z0000 Encounter for general adult medical examination without abnormal findings: Secondary | ICD-10-CM

## 2022-04-20 DIAGNOSIS — R252 Cramp and spasm: Secondary | ICD-10-CM | POA: Diagnosis not present

## 2022-04-20 DIAGNOSIS — M069 Rheumatoid arthritis, unspecified: Secondary | ICD-10-CM | POA: Diagnosis not present

## 2022-04-20 DIAGNOSIS — R2689 Other abnormalities of gait and mobility: Secondary | ICD-10-CM

## 2022-04-20 DIAGNOSIS — H353211 Exudative age-related macular degeneration, right eye, with active choroidal neovascularization: Secondary | ICD-10-CM

## 2022-04-20 DIAGNOSIS — K219 Gastro-esophageal reflux disease without esophagitis: Secondary | ICD-10-CM

## 2022-04-20 DIAGNOSIS — I10 Essential (primary) hypertension: Secondary | ICD-10-CM

## 2022-04-20 DIAGNOSIS — Z9181 History of falling: Secondary | ICD-10-CM

## 2022-04-20 DIAGNOSIS — R531 Weakness: Secondary | ICD-10-CM

## 2022-04-20 MED ORDER — PANTOPRAZOLE SODIUM 20 MG PO TBEC: 20.0000 mg | DELAYED_RELEASE_TABLET | Freq: Every day | ORAL | 3 refills | Status: DC

## 2022-04-20 NOTE — Assessment & Plan Note (Signed)
R calf especially since her fall and injury  Cannot take mag since per pt level was high in the past   Enc her to try mustard daily  Also stretching Regular walking when able  Update if not imp

## 2022-04-20 NOTE — Assessment & Plan Note (Signed)
More congestion in the ams If this continues may benefit from a steroid ns

## 2022-04-20 NOTE — Assessment & Plan Note (Signed)
Currently back on protonix daily but 20 mg instead of 40  Watching diet as well

## 2022-04-20 NOTE — Assessment & Plan Note (Signed)
Lab Results  Component Value Date   HGBA1C 5.6 04/13/2022   disc imp of low glycemic diet and wt loss to prevent DM2

## 2022-04-20 NOTE — Progress Notes (Signed)
Subjective:    Patient ID: Sierra Bentley, female    DOB: 1933-03-30, 89 y.o.   MRN: [id number]  HPI Here for health maintenance exam and to review chronic medical problems     Wt Readings from Last 3 Encounters:  04/20/22 118 lb 4 oz (53.6 kg)  02/15/22 116 lb (52.6 kg)  12/23/21 113 lb (51.3 kg)   20.78 kg/m  Vitals:   04/20/22 1104  BP: 132/66  Pulse: 79  Temp: 97.9 F (36.6 C)  SpO2: 99%    Had a fall since last visit  Balance is bad and would like to do some outpt PT for that  Missed a step when she turned around  [place] down the stairs and got her R leg caught-  no fractures ([facility]?)  Still has some pain in r calf  Also L foot goes to sleep at night   More cramps in the back of her R leg  Was given some home exercises to do at home  Did not help  To hard to keep up at home   She had high mag level once- cannot take mag for cramps  Mustard helps a little   She wakes up congested every am  No cold  May be allergies      Immunization History  Administered Date(s) Administered   Fluad Quad(high Dose 65+) 11/14/2018, 12/17/2019, 10/30/2021   Influenza Split 11/30/2010, 12/16/2011   Influenza Whole 10/26/2007   Influenza,inj,Quad PF,6+ Mos 11/09/2012, 11/16/2013, 01/03/2015, 11/06/2015, 11/26/2016, 12/02/2017   Influenza-Unspecified 12/31/2020   Moderna Sars-Covid-2 Vaccination 03/13/2019, 04/10/2019   Pneumococcal Conjugate-13 07/25/2013   Pneumococcal Polysaccharide-23 11/30/2010   Td 01/25/2005, 05/17/2016   Zoster Recombinat (Shingrix) 09/03/2020   Zoster, Live 03/29/2008   Health Maintenance Due  Topic Date Due   COVID-19 Vaccine (3 - Moderna risk series) 05/08/2019   Zoster Vaccines- Shingrix (2 of 2) 10/29/2020   MAMMOGRAM  03/10/2022   Goes to gyn for breast/pelvic  Found polyps on uterus - not treating   Interested in rsv vaccine    Shingrix- had one in 08/2020  Mammogram 02/2021 -dx with breast cancer and treated  Has one  planned Thursday at [facility] (diagnostic)  Had radiation and now obs by onc- will f/u there in summer  Self breast exam : no changes at all   Dexa 10/2020 OP worse  Intol of boniva and alendronate in the past (swallowing and nose bleed problems)  Followed by gyn who recommended prolia  She declines prolia but does not know anything about it  Falls: one  Fractures, had hip fx in 2020   Supplements - ca and D Last vitamin D Lab Results  Component Value Date   VD25OH 35.24 04/13/2022    Exercise : none yet   Sees dentist  Some gum inflammation  May return for that    Out aged colon cancer screening   Mood     12/23/2021   10:37 AM 12/16/2020    2:51 PM 12/11/2019    3:38 PM 12/08/2018    9:37 AM 11/29/2017   10:06 AM  Depression screen PHQ 2/9  Decreased Interest 0 0 0 0 0  Down, Depressed, Hopeless 0 0 0 0 1  PHQ - 2 Score 0 0 0 0 1  Altered sleeping 0  0  0  Tired, decreased energy 0  0  1  Change in appetite 0  0  0  Feeling bad or failure about yourself  0  0  0  Trouble concentrating 0  0  0  Moving slowly or fidgety/restless 0  0  0  Suicidal thoughts 0  0  0  PHQ-9 Score 0  0  2  Difficult doing work/chores Not difficult at all  Not difficult at all  Not difficult at all   Joined a women's club Goes to church Trying to socialize    HTN bp is stable today  No cp or palpitations or headaches or edema  No side effects to medicines  BP Readings from Last 3 Encounters:  04/20/22 132/66  02/15/22 (!) 133/55  10/30/21 116/62     Losartan 50-12.5 mg  1/2 pill daily    Eye care Macular degeneration   Had GERD Went down to 20 mg with protonix  Has a lot of gas Lab Results  Component Value Date   [id number] 04/13/2022   Takes align   H/o RA No treatment   Hyperlipidemia Lab Results  Component Value Date   CHOL 199 04/13/2022   CHOL 165 03/19/2021   CHOL 184 12/06/2019   Lab Results  Component Value Date   HDL 72.90 04/13/2022   HDL  65.10 03/19/2021   HDL 64.60 12/06/2019   Lab Results  Component Value Date   LDLCALC 111 (H) 04/13/2022   LDLCALC 87 03/19/2021   LDLCALC 103 (H) 12/06/2019   Lab Results  Component Value Date   TRIG 78.0 04/13/2022   TRIG 66.0 03/19/2021   TRIG 84.0 12/06/2019   Lab Results  Component Value Date   CHOLHDL 3 04/13/2022   CHOLHDL 3 03/19/2021   CHOLHDL 3 12/06/2019   Lab Results  Component Value Date   LDLDIRECT 130.4 02/25/2012   LDLDIRECT 134.1 08/18/2011   LDLDIRECT 155.9 11/30/2010   Cholesterol is up a little  Hard to cook for one person  Not eating as well   Prediabetes Lab Results  Component Value Date   HGBA1C 5.6 04/13/2022   This is up from 5.4 Well controlled    Patient Active Problem List   Diagnosis Date Noted   Grief reaction 06/14/2021   General weakness 06/12/2021   Current use of proton pump inhibitor 03/19/2021   Family history of prostate cancer 03/18/2021   Malignant neoplasm of upper-outer quadrant of left breast in female, estrogen receptor negative ([place]) 03/17/2021   Poor balance 05/07/2019   Exudative age-related macular degeneration, right eye, with active choroidal neovascularization ([place]) 09/04/2018   History of hip fracture 02/27/2018   Pedal edema 09/28/2017   Prediabetes 11/30/2016   At moderate risk for fall 09/05/2015   Cervical spondylosis without myelopathy 08/05/2015   Routine general medical examination at a health care facility 08/27/2014   Colon cancer screening 08/27/2014   Encounter for Medicare annual wellness exam 07/25/2013   Varicosities of leg 05/23/2012   Dry eye syndrome 04/14/2012   Nonexudative senile macular degeneration of retina 04/14/2012   Fuchs' corneal dystrophy 04/14/2012   Pseudophakia 04/14/2012   Abnormal ultrasound of thyroid gland 03/01/2011   Leg cramps 05/27/2009   Hyperlipidemia 06/11/2008   Allergic rhinitis 03/29/2008   Essential hypertension, benign 12/26/2006   GERD 12/26/2006    Rheumatoid arthritis ([place]) 12/26/2006   Osteoporosis 12/26/2006   URINARY INCONTINENCE 12/26/2006   Past Medical History:  Diagnosis Date   Allergic rhinitis, cause unspecified    Anemia    Breast cancer ([place])    left breast IDC 2023   Chronically dry eyes, bilateral    Diverticulosis  of colon    Endometrial polyp    Family history of prostate cancer 03/18/2021   Fuchs' corneal dystrophy of right eye    GERD (gastroesophageal reflux disease)    Hearing loss of both ears    pt stated has hearing aids but does not wear   History of epistaxis    per pt has had several cautization's for nose bleed   History of radiation therapy    05/20/21-06/10/21- Dr. Gery Pray   HTN (hypertension)    Hyperlipidemia    Macular degeneration of both eyes    followed @[facility]  by dr Mamie Nick. mettu;   both age-related and right is with active choroidal neovascularization   OA (osteoarthritis)    OAB (overactive bladder)    Osteopenia    Thickened endometrium    Urge incontinence of urine    Wears glasses    Past Surgical History:  Procedure Laterality Date   BREAST BIOPSY Left 2011   fibrocystic change   BREAST LUMPECTOMY WITH RADIOACTIVE SEED LOCALIZATION Left 04/13/2021   Procedure: LEFT BREAST LUMPECTOMY WITH RADIOACTIVE SEED LOCALIZATION;  Surgeon: Rolm Bookbinder, MD;  Location: [facility];  Service: General;  Laterality: Left;   CATARACT EXTRACTION W/ INTRAOCULAR LENS IMPLANT Bilateral 2008   COLONOSCOPY  2013   DILATATION & CURETTAGE/HYSTEROSCOPY WITH MYOSURE N/A 11/26/2020   Procedure: [place];  Surgeon: Princess Bruins, MD;  Location: [facility];  Service: Gynecology;  Laterality: N/A;   TONSILLECTOMY  1962   Social History   Tobacco Use   Smoking status: Never    Passive exposure: Never   Smokeless tobacco: Never  Vaping Use   Vaping Use: Never used  Substance Use Topics   Alcohol use: No    Alcohol/week:  0.0 standard drinks of alcohol   Drug use: Never   Family History  Problem Relation Age of Onset   Other Mother        Arrythmia   Coronary artery disease Mother    Hypertension Mother    Anxiety disorder Mother    Heart attack Father 58   Coronary artery disease Father    Prostate cancer Father        dx unknown age   Throat cancer Brother        d. 6   Cancer Other        two maternal female cousins; unknown cancer; dx after 56   Depression Other        family history   Allergies  Allergen Reactions   Ace Inhibitors Cough   Boniva [Ibandronic Acid]     Esophageal distress   Chocolate Other (See Comments)    Severe headache and stopped up sinuses   Fosamax [Alendronate]     Esophageal distress   Oxycodone Other (See Comments)    Dizziness resulting in fall    Current Outpatient Medications on File Prior to Visit  Medication Sig Dispense Refill   Carboxymethylcellulose Sodium (THERATEARS) 0.25 % SOLN Apply 1 drop to eye daily as needed.     latanoprost (XALATAN) 0.005 % ophthalmic solution 1 drop at bedtime.     losartan-hydrochlorothiazide (HYZAAR) 50-12.5 MG tablet Take 0.5 tablets by mouth every evening. 45 tablet 3   Multiple Vitamin (MULTIVITAMIN) tablet Take 1 tablet by mouth daily. Per pt has Postmenopausal multivitamin packet with 5 pills     Polyethyl Glycol-Propyl Glycol (SYSTANE ULTRA OP) Place 1 drop into both eyes 4 (four) times daily.  Probiotic Product (ALIGN EXTRA STRENGTH PO) Take by mouth.     sodium chloride (MURO 128) 5 % ophthalmic ointment Place 1 application into both eyes at bedtime.      sodium chloride (MURO 128) 5 % ophthalmic solution 1 drop as needed for eye irritation.     TURMERIC PO Take 1,300 mg by mouth in the morning and at bedtime.     No current facility-administered medications on file prior to visit.     Review of Systems  Constitutional:  Negative for activity change, appetite change, fatigue, fever and unexpected weight  change.  HENT:  Positive for congestion. Negative for ear pain, rhinorrhea, sinus pressure and sore throat.   Eyes:  Negative for pain, redness and visual disturbance.  Respiratory:  Negative for cough, shortness of breath and wheezing.   Cardiovascular:  Negative for chest pain and palpitations.  Gastrointestinal:  Negative for abdominal pain, blood in stool, constipation and diarrhea.  Endocrine: Negative for polydipsia and polyuria.  Genitourinary:  Negative for dysuria, frequency and urgency.  Musculoskeletal:  Negative for arthralgias, back pain and myalgias.       Muscle cramps- L lower leg after injury    Skin:  Negative for pallor and rash.  Allergic/Immunologic: Negative for environmental allergies.  Neurological:  Negative for dizziness, syncope and headaches.       Poor balance  Hematological:  Negative for adenopathy. Does not bruise/bleed easily.  Psychiatric/Behavioral:  Negative for decreased concentration and dysphoric mood. The patient is not nervous/anxious.        Objective:   Physical Exam Constitutional:      General: She is not in acute distress.    Appearance: Normal appearance. She is well-developed and normal weight. She is not ill-appearing or diaphoretic.  HENT:     Head: Normocephalic and atraumatic.     Right Ear: Tympanic membrane, ear canal and external ear normal.     Left Ear: Tympanic membrane, ear canal and external ear normal.     Nose: Nose normal. No congestion.     Mouth/Throat:     Mouth: Mucous membranes are moist.     Pharynx: Oropharynx is clear. No posterior oropharyngeal erythema.  Eyes:     General: No scleral icterus.       Right eye: No discharge.        Left eye: No discharge.     Extraocular Movements: Extraocular movements intact.     Conjunctiva/sclera: Conjunctivae normal.     Pupils: Pupils are equal, round, and reactive to light.  Neck:     Thyroid: No thyromegaly.     Vascular: No carotid bruit or JVD.  Cardiovascular:      Rate and Rhythm: Normal rate and regular rhythm.     Pulses: Normal pulses.     Heart sounds: Normal heart sounds.     No gallop.  Pulmonary:     Effort: Pulmonary effort is normal. No respiratory distress.     Breath sounds: Normal breath sounds. No wheezing or rales.     Comments: Good air exch Chest:     Chest wall: No tenderness.  Abdominal:     General: Bowel sounds are normal. There is no distension or abdominal bruit.     Palpations: Abdomen is soft. There is no mass.     Tenderness: There is no abdominal tenderness.     Hernia: No hernia is present.  Genitourinary:    Comments: Breast and pelvic exams are done by gyn Musculoskeletal:  General: No tenderness. Normal range of motion.     Cervical back: Normal range of motion and neck supple. No rigidity. No muscular tenderness.     Right lower leg: No edema.     Left lower leg: No edema.     Comments: mild kyphosis   Lymphadenopathy:     Cervical: No cervical adenopathy.  Skin:    General: Skin is warm and dry.     Coloration: Skin is not pale.     Findings: No erythema or rash.     Comments: Solar lentigines diffusely   Neurological:     Mental Status: She is alert. Mental status is at baseline.     Cranial Nerves: No cranial nerve deficit.     Motor: No abnormal muscle tone.     Coordination: Coordination normal.     Gait: Gait normal.     Deep Tendon Reflexes: Reflexes are normal and symmetric. Reflexes normal.  Psychiatric:        Mood and Affect: Mood normal.        Cognition and Memory: Cognition and memory normal.           Assessment & Plan:   Problem List Items Addressed This Visit       Cardiovascular and Mediastinum   Essential hypertension, benign    bp in fair control at this time  BP Readings from Last 1 Encounters:  04/20/22 132/66  No changes needed Most recent labs reviewed  Disc lifstyle change with low sodium diet and exercise  Her cuff was fairly close  Continues  losartan 50-12.5 mg half pill daily      Exudative age-related macular degeneration, right eye, with active choroidal neovascularization ([place])    Continues ophthy care       Varicosities of leg    Continues to wear support stockings         Respiratory   Allergic rhinitis    More congestion in the ams If this continues may benefit from a steroid ns         Digestive   GERD    Currently back on protonix daily but 20 mg instead of 40  Watching diet as well        Relevant Medications   pantoprazole (PROTONIX) 20 MG tablet     Musculoskeletal and Integument   Osteoporosis    Dexa 10/2020 - worse  Saw commend from gyn- recommended prolia (was intol of alendronate and boniva) Given handout on prolia today and urged her to d/w her gyn about it   Enc her to continue supplements Add strength training exercise when able       Rheumatoid arthritis ([place])    No treatment No clinical changes         Other   At moderate risk for fall   Relevant Orders   Ambulatory referral to Physical Therapy   Current use of proton pump inhibitor    Now went down on protonix to 20 mg daily  Lab Results  Component Value Date   VITAMINB12 824 04/13/2022        General weakness    Would like to do more PT outpatient        Relevant Orders   Ambulatory referral to Physical Therapy   Hyperlipidemia    Disc goals for lipids and reasons to control them Rev last labs with pt LDL up slt to 111  Very good HDL  Rev low sat fat diet in detail Labs  ordered  Good diet        Leg cramps    R calf especially since her fall and injury  Cannot take mag since per pt level was high in the past   Enc her to try mustard daily  Also stretching Regular walking when able  Update if not imp      Poor balance    Pt is interested in outpt PT for balance and strength  Has had another fall on stairs Known OP  Disc fall prevention today      Relevant Orders   Ambulatory referral to  Physical Therapy   Prediabetes    Lab Results  Component Value Date   HGBA1C 5.6 04/13/2022  disc imp of low glycemic diet and wt loss to prevent DM2       Routine general medical examination at a health care facility - Primary    Reviewed health habits including diet and exercise and skin cancer prevention Reviewed appropriate screening tests for age  Also reviewed health mt list, fam hx and immunization status , as well as social and family history   See HPI Labs reviewed  PT ordered for balance and fall prev  Disc RSV vaccine Plans to get 2nd shingrix vaccine at the pharmacy  Mammogram upcoming next month in setting of recently tx breast cancer Plans to continue yearly gyn exams Rev last dexa/ one fall/no fx -will f/u with gyn for OP and discuss prolia  Enc ca and D and strength building exercise  Enc to get back on track with dental care PHQ score of 0

## 2022-04-20 NOTE — Assessment & Plan Note (Signed)
Continues ophthy care

## 2022-04-20 NOTE — Patient Instructions (Addendum)
Call your pharmacy and plan to get your 2nd shingrix vaccine   You can also ask about the RSV vaccine   Get your mammogram as planned   Read about the Prolia treatment for osteoporosis and discuss with your gynecologist   Exercise is important for bone health  Add some strength training to your routine, this is important for bone and brain health and can reduce your risk of falls and help your body use insulin properly and regulate weight  Light weights, exercise bands , and internet videos are a good way to start  Yoga (chair or regular), machines , floor exercises or a gym with machines are also good options     Mustard and some stretching are good for leg cramps  Stay as active as you can safely be

## 2022-04-20 NOTE — Assessment & Plan Note (Signed)
Would like to do more PT outpatient

## 2022-04-20 NOTE — Assessment & Plan Note (Signed)
Dexa 10/2020 - worse  Saw commend from gyn- recommended prolia (was intol of alendronate and boniva) Given handout on prolia today and urged her to d/w her gyn about it   Enc her to continue supplements Add strength training exercise when able

## 2022-04-20 NOTE — Assessment & Plan Note (Signed)
Pt is interested in outpt PT for balance and strength  Has had another fall on stairs Known OP  Disc fall prevention today

## 2022-04-20 NOTE — Assessment & Plan Note (Signed)
No treatment No clinical changes

## 2022-04-20 NOTE — Assessment & Plan Note (Signed)
Now went down on protonix to 20 mg daily  Lab Results  Component Value Date   VITAMINB12 824 04/13/2022

## 2022-04-20 NOTE — Assessment & Plan Note (Signed)
bp in fair control at this time  BP Readings from Last 1 Encounters:  04/20/22 132/66   No changes needed Most recent labs reviewed  Disc lifstyle change with low sodium diet and exercise  Her cuff was fairly close  Continues losartan 50-12.5 mg half pill daily

## 2022-04-20 NOTE — Assessment & Plan Note (Signed)
Disc goals for lipids and reasons to control them Rev last labs with pt LDL up slt to 111  Very good HDL  Rev low sat fat diet in detail Labs ordered  Good diet

## 2022-04-20 NOTE — Assessment & Plan Note (Signed)
Reviewed health habits including diet and exercise and skin cancer prevention Reviewed appropriate screening tests for age  Also reviewed health mt list, fam hx and immunization status , as well as social and family history   See HPI Labs reviewed  PT ordered for balance and fall prev  Disc RSV vaccine Plans to get 2nd shingrix vaccine at the pharmacy  Mammogram upcoming next month in setting of recently tx breast cancer Plans to continue yearly gyn exams Rev last dexa/ one fall/no fx -will f/u with gyn for OP and discuss prolia  Enc ca and D and strength building exercise  Enc to get back on track with dental care PHQ score of 0

## 2022-04-20 NOTE — Assessment & Plan Note (Signed)
Continues to wear support stockings

## 2022-04-26 DIAGNOSIS — H353231 Exudative age-related macular degeneration, bilateral, with active choroidal neovascularization: Secondary | ICD-10-CM | POA: Diagnosis not present

## 2022-04-28 DIAGNOSIS — R921 Mammographic calcification found on diagnostic imaging of breast: Secondary | ICD-10-CM | POA: Diagnosis not present

## 2022-04-28 DIAGNOSIS — Z853 Personal history of malignant neoplasm of breast: Secondary | ICD-10-CM | POA: Diagnosis not present

## 2022-04-28 DIAGNOSIS — R922 Inconclusive mammogram: Secondary | ICD-10-CM | POA: Diagnosis not present

## 2022-04-29 ENCOUNTER — Encounter: Payer: Self-pay | Admitting: Obstetrics & Gynecology

## 2022-05-18 ENCOUNTER — Ambulatory Visit: Payer: Medicare PPO | Admitting: Family Medicine

## 2022-05-20 ENCOUNTER — Encounter: Payer: Self-pay | Admitting: Family Medicine

## 2022-05-20 ENCOUNTER — Ambulatory Visit: Payer: Medicare PPO | Admitting: Family Medicine

## 2022-05-20 VITALS — BP 122/68 | HR 72 | Temp 97.8°F | Ht 63.25 in | Wt 118.1 lb

## 2022-05-20 DIAGNOSIS — I10 Essential (primary) hypertension: Secondary | ICD-10-CM

## 2022-05-20 DIAGNOSIS — R0602 Shortness of breath: Secondary | ICD-10-CM

## 2022-05-20 DIAGNOSIS — Z9181 History of falling: Secondary | ICD-10-CM | POA: Diagnosis not present

## 2022-05-20 DIAGNOSIS — R300 Dysuria: Secondary | ICD-10-CM | POA: Diagnosis not present

## 2022-05-20 DIAGNOSIS — R2689 Other abnormalities of gait and mobility: Secondary | ICD-10-CM | POA: Diagnosis not present

## 2022-05-20 DIAGNOSIS — M533 Sacrococcygeal disorders, not elsewhere classified: Secondary | ICD-10-CM

## 2022-05-20 LAB — POC URINALSYSI DIPSTICK (AUTOMATED)
Bilirubin, UA: NEGATIVE
Blood, UA: NEGATIVE
Glucose, UA: NEGATIVE
Ketones, UA: NEGATIVE
Leukocytes, UA: NEGATIVE
Nitrite, UA: NEGATIVE
Protein, UA: NEGATIVE
Spec Grav, UA: 1.01 (ref 1.010–1.025)
Urobilinogen, UA: 0.2 E.U./dL
pH, UA: 6 (ref 5.0–8.0)

## 2022-05-20 MED ORDER — LOSARTAN POTASSIUM-HCTZ 50-12.5 MG PO TABS: 0.5000 | ORAL_TABLET | Freq: Every evening | ORAL | 3 refills | Status: DC

## 2022-05-20 NOTE — Progress Notes (Signed)
Subjective:    Patient ID: Sierra Bentley, female    DOB: [birth year], 89 y.o.   MRN: [id number]  HPI Pt presents for f/u after a fall on Thursday  Also not feeling well   Elbow wound- son dressed it  Did hit her head   Spot on bottom -hurts to sit /unrelated to fall  Needs bp med refilled   Urinary frequency /dysuria occ   Has noted some sob with exertion   Congestion in throat - thick but clear  Little cough     Wt Readings from Last 3 Encounters:  05/20/22 118 lb 2 oz (53.6 kg)  04/20/22 118 lb 4 oz (53.6 kg)  02/15/22 116 lb (52.6 kg)   20.76 kg/m  Vitals:   05/20/22 1109  BP: 122/68  Pulse: 72  Temp: 97.8 F (36.6 C)  SpO2: 99%    Fell 8 days ago  Wearing dark glasses in a store and mis steps  She tumbled (did not fall hard  Skin tear - grandson wrapped her (he works in wound clinic)  Hit her head on the L posterior  Is sore  No headaches  No new dizziness  No nause  No confusion  No bleeding/ did not break the skin     Ua is clear today  Results for orders placed or performed in visit on 05/20/22  POCT Urinalysis Dipstick (Automated)  Result Value Ref Range   Color, UA Yellow    Clarity, UA Clear    Glucose, UA Negative Negative   Bilirubin, UA Negative    Ketones, UA Negative    Spec Grav, UA 1.010 1.010 - 1.025   Blood, UA Negative    pH, UA 6.0 5.0 - 8.0   Protein, UA Negative Negative   Urobilinogen, UA 0.2 0.2 or 1.0 E.U./dL   Nitrite, UA Negative    Leukocytes, UA Negative Negative        Has hx of generalized weakness and PT referral done for this and poor balance in the past  Went a few times  Then told to do it at home - that does not work for her  Does not want to go there any more   She prefers to do at [facility]       Last labs Last metabolic panel Lab Results  Component Value Date   GLUCOSE 88 04/13/2022   NA 140 04/13/2022   K 4.1 04/13/2022   CL 100 04/13/2022   CO2 31 04/13/2022   BUN 14 04/13/2022    CREATININE 0.64 04/13/2022   GFRNONAA >60 04/03/2021   CALCIUM 9.9 04/13/2022   PHOS 4.2 05/27/2009   PROT 6.8 04/13/2022   ALBUMIN 4.2 04/13/2022   BILITOT 0.5 04/13/2022   ALKPHOS 56 04/13/2022   AST 20 04/13/2022   ALT 15 04/13/2022   ANIONGAP 8 04/03/2021  GFR 80.0    Lab Results  Component Value Date   WBC 4.9 04/13/2022   HGB 13.3 04/13/2022   HCT 40.3 04/13/2022   MCV 85.4 04/13/2022   PLT 240.0 04/13/2022   Lab Results  Component Value Date   TSH 2.33 04/13/2022   Patient Active Problem List   Diagnosis Date Noted   History of fall 05/20/2022   Sacral pain 05/20/2022   SOB (shortness of breath) 05/20/2022   Grief reaction 06/14/2021   General weakness 06/12/2021   Current use of proton pump inhibitor 03/19/2021   Family history of prostate cancer 03/18/2021   Malignant neoplasm  of upper-outer quadrant of left breast in female, estrogen receptor negative 03/17/2021   Poor balance 05/07/2019   Exudative age-related macular degeneration, right eye, with active choroidal neovascularization 09/04/2018   History of hip fracture 02/27/2018   Pedal edema 09/28/2017   Prediabetes 11/30/2016   At moderate risk for fall 09/05/2015   Cervical spondylosis without myelopathy 08/05/2015   Routine general medical examination at a health care facility 08/27/2014   Colon cancer screening 08/27/2014   Encounter for Medicare annual wellness exam 07/25/2013   Varicosities of leg 05/23/2012   Dry eye syndrome 04/14/2012   Nonexudative senile macular degeneration of retina 04/14/2012   Fuchs' corneal dystrophy 04/14/2012   Pseudophakia 04/14/2012   Abnormal ultrasound of thyroid gland 03/01/2011   Leg cramps 05/27/2009   Hyperlipidemia 06/11/2008   Allergic rhinitis 03/29/2008   Essential hypertension, benign 12/26/2006   GERD 12/26/2006   Rheumatoid arthritis 12/26/2006   Osteoporosis 12/26/2006   URINARY INCONTINENCE 12/26/2006   Past Medical History:  Diagnosis Date    Allergic rhinitis, cause unspecified    Anemia    Breast cancer    left breast IDC 2023   Chronically dry eyes, bilateral    Diverticulosis of colon    Endometrial polyp    Family history of prostate cancer 03/18/2021   Fuchs' corneal dystrophy of right eye    GERD (gastroesophageal reflux disease)    Hearing loss of both ears    pt stated has hearing aids but does not wear   History of epistaxis    per pt has had several cautization's for nose bleed   History of radiation therapy    05/20/21-06/10/21- Dr. Antony Blackbird   HTN (hypertension)    Hyperlipidemia    Macular degeneration of both eyes    followed @[facility]  by dr Demetrius Charity. mettu;   both age-related and right is with active choroidal neovascularization   OA (osteoarthritis)    OAB (overactive bladder)    Osteopenia    Thickened endometrium    Urge incontinence of urine    Wears glasses    Past Surgical History:  Procedure Laterality Date   BREAST BIOPSY Left 2011   fibrocystic change   BREAST LUMPECTOMY WITH RADIOACTIVE SEED LOCALIZATION Left 04/13/2021   Procedure: LEFT BREAST LUMPECTOMY WITH RADIOACTIVE SEED LOCALIZATION;  Surgeon: Emelia Loron, MD;  Location: [facility];  Service: General;  Laterality: Left;   CATARACT EXTRACTION W/ INTRAOCULAR LENS IMPLANT Bilateral 2008   COLONOSCOPY  2013   DILATATION & CURETTAGE/HYSTEROSCOPY WITH MYOSURE N/A 11/26/2020   Procedure: DILATATION & CURETTAGE/HYSTEROSCOPY WITH MYOSURE;  Surgeon: Genia Del, MD;  Location: [facility];  Service: Gynecology;  Laterality: N/A;   TONSILLECTOMY  1962   Social History   Tobacco Use   Smoking status: Never    Passive exposure: Never   Smokeless tobacco: Never  Vaping Use   Vaping Use: Never used  Substance Use Topics   Alcohol use: No    Alcohol/week: 0.0 standard drinks of alcohol   Drug use: Never   Family History  Problem Relation Age of Onset   Other Mother        Arrythmia   Coronary  artery disease Mother    Hypertension Mother    Anxiety disorder Mother    Heart attack Father 64   Coronary artery disease Father    Prostate cancer Father        dx unknown age   Throat cancer Brother  d. 20   Cancer Other        two maternal female cousins; unknown cancer; dx after 43   Depression Other        family history   Allergies  Allergen Reactions   Ace Inhibitors Cough   Boniva [Ibandronic Acid]     Esophageal distress   Chocolate Other (See Comments)    Severe headache and stopped up sinuses   Fosamax [Alendronate]     Esophageal distress   Oxycodone Other (See Comments)    Dizziness resulting in fall    Current Outpatient Medications on File Prior to Visit  Medication Sig Dispense Refill   Carboxymethylcellulose Sodium (THERATEARS) 0.25 % SOLN Apply 1 drop to eye daily as needed.     latanoprost (XALATAN) 0.005 % ophthalmic solution 1 drop at bedtime.     Multiple Vitamin (MULTIVITAMIN) tablet Take 1 tablet by mouth daily. Per pt has Postmenopausal multivitamin packet with 5 pills     pantoprazole (PROTONIX) 20 MG tablet Take 1 tablet (20 mg total) by mouth daily. 90 tablet 3   Polyethyl Glycol-Propyl Glycol (SYSTANE ULTRA OP) Place 1 drop into both eyes 4 (four) times daily.     Probiotic Product (ALIGN EXTRA STRENGTH PO) Take by mouth.     sodium chloride (MURO 128) 5 % ophthalmic ointment Place 1 application into both eyes at bedtime.      sodium chloride (MURO 128) 5 % ophthalmic solution 1 drop as needed for eye irritation.     TURMERIC PO Take 1,300 mg by mouth in the morning and at bedtime.     No current facility-administered medications on file prior to visit.     Review of Systems  Constitutional:  Positive for fatigue. Negative for activity change, appetite change, fever and unexpected weight change.  HENT:  Positive for postnasal drip and voice change. Negative for congestion, ear pain, rhinorrhea, sinus pressure and sore throat.   Eyes:   Negative for pain, redness and visual disturbance.  Respiratory:  Positive for cough and shortness of breath. Negative for wheezing.   Cardiovascular:  Positive for leg swelling. Negative for chest pain and palpitations.       Occ ankle swelling   Gastrointestinal:  Negative for abdominal pain, blood in stool, constipation and diarrhea.  Endocrine: Negative for polydipsia and polyuria.  Genitourinary:  Positive for frequency. Negative for dysuria and urgency.  Musculoskeletal:  Negative for arthralgias, back pain and myalgias.  Skin:  Negative for pallor and rash.       Sacral soreness w/o wound   Allergic/Immunologic: Negative for environmental allergies.  Neurological:  Positive for weakness. Negative for dizziness, syncope and headaches.  Hematological:  Negative for adenopathy. Does not bruise/bleed easily.  Psychiatric/Behavioral:  Negative for decreased concentration and dysphoric mood. The patient is not nervous/anxious.        Objective:   Physical Exam Constitutional:      General: She is not in acute distress.    Appearance: She is well-developed and normal weight. She is not ill-appearing or diaphoretic.  HENT:     Head: Normocephalic.     Comments: Lump on head L posterior  Non tender No ecchymosis     Right Ear: Tympanic membrane and ear canal normal.     Left Ear: Tympanic membrane and ear canal normal.     Mouth/Throat:     Comments: MM are slt dry  Eyes:     General: No scleral icterus.  Right eye: No discharge.        Left eye: No discharge.     Conjunctiva/sclera: Conjunctivae normal.     Pupils: Pupils are equal, round, and reactive to light.  Neck:     Thyroid: No thyromegaly.     Vascular: No carotid bruit or JVD.  Cardiovascular:     Rate and Rhythm: Normal rate and regular rhythm.     Heart sounds: Normal heart sounds.     No gallop.  Pulmonary:     Effort: Pulmonary effort is normal. No respiratory distress.     Breath sounds: Normal breath  sounds. No stridor. No wheezing, rhonchi or rales.     Comments: No crackles Abdominal:     General: There is no distension or abdominal bruit.     Palpations: Abdomen is soft. There is no mass.     Tenderness: There is no abdominal tenderness.  Musculoskeletal:     Cervical back: Normal range of motion and neck supple.     Right lower leg: Edema present.     Left lower leg: Edema present.     Comments: Trace pedal edema / sock line  Lymphadenopathy:     Cervical: No cervical adenopathy.  Skin:    General: Skin is warm and dry.     Coloration: Skin is not pale.     Findings: No rash.     Comments: No sacral ulceration noted  Neurological:     Mental Status: She is alert.     Coordination: Coordination normal.     Deep Tendon Reflexes: Reflexes are normal and symmetric. Reflexes normal.     Comments: Generalized weakness No t focal  Psychiatric:        Mood and Affect: Mood normal.           Assessment & Plan:   Problem List Items Addressed This Visit       Cardiovascular and Mediastinum   Essential hypertension, benign    bp in fair control at this time  BP Readings from Last 1 Encounters:  05/20/22 122/68  No changes needed Most recent labs reviewed  Disc lifstyle change with low sodium diet and exercise  Her cuff was fairly close  Continues losartan 50-12.5 mg half pill daily      Relevant Medications   losartan-hydrochlorothiazide (HYZAAR) 50-12.5 MG tablet     Other   At moderate risk for fall    Recent fall in store while wearing dark blasses  She would like to do PT again for fall prevention but prefers to go to [facility]  Ref done Disc use of walker vs cane      Relevant Orders   Ambulatory referral to Physical Therapy   Dysuria    Occ  May be due to concentrated urine Disc water intake  Ua clear Continue to follow      Relevant Orders   POCT Urinalysis Dipstick (Automated) (Completed)   History of fall - Primary    Recent fall in a store   Hit head -no s/s of concussion  Hit elbow with skin tear- wrapped appropriately from fam member who does wound care   Disc s/s of concussion to watch for  No other injuries Disc fall prec and walker vs cane Re ref to PT-wants to change to [facility]      Relevant Orders   Ambulatory referral to Physical Therapy   Poor balance    rRecent fall  Prefers to do PT at [facility]- referral  done      Relevant Orders   Ambulatory referral to Physical Therapy   Sacral pain    With sitting  No ulcer or pre ulcer seen Recommend off loading with donut pillow      SOB (shortness of breath)    Pt thinks this may be from pnd  Disc allergen avoidance Also tx gerd F/u for further eval in 1 wk

## 2022-05-20 NOTE — Assessment & Plan Note (Signed)
Recent fall in a store  Hit head -no s/s of concussion  Hit elbow with skin tear- wrapped appropriately from fam member who does wound care   Disc s/s of concussion to watch for  No other injuries Disc fall prec and walker vs cane Re ref to PT-wants to change to [facility]

## 2022-05-20 NOTE — Assessment & Plan Note (Signed)
bp in fair control at this time  BP Readings from Last 1 Encounters:  05/20/22 122/68   No changes needed Most recent labs reviewed  Disc lifstyle change with low sodium diet and exercise  Her cuff was fairly close  Continues losartan 50-12.5 mg half pill daily

## 2022-05-20 NOTE — Assessment & Plan Note (Signed)
Recent fall in store while wearing dark blasses  She would like to do PT again for fall prevention but prefers to go to [facility]  Ref done Disc use of walker vs cane

## 2022-05-20 NOTE — Patient Instructions (Addendum)
Increase your fluids  Aim for 60 or more oz of fluid daily =mostly water if you can  Your urine test is clear   Wear your support stockings when you can tolerate them   I think your head injury is mild and no signs of concussion  If this changes let us know - if you get regular headaches, worse dizziness, nausea, confusion or trouble concentrating   You can use an ice pack on head if it is sore   Keep the skin tear on your elbow clean with soap and water  Keep dressing as you are- that is the right thing to do  If redness/pain/drainage (signs of infection) let us know   I will pt order in for PT at [facility] and see if that is helpful   I put the referral in  Please let us know if you don't hear in 1-2 weeks     Let's watch you carefully   Use a round/donut shaped pillow for chair -this will help prevent an ulcer on the buttock area    If the post nasal drainage worsens or if you get short of breath please come back and we will start a visit for that   Follow up to discuss that in a week

## 2022-05-20 NOTE — Assessment & Plan Note (Signed)
Occ  May be due to concentrated urine Disc water intake  Ua clear Continue to follow

## 2022-05-20 NOTE — Assessment & Plan Note (Signed)
With sitting  No ulcer or pre ulcer seen Recommend off loading with donut pillow

## 2022-05-20 NOTE — Assessment & Plan Note (Addendum)
rRecent fall  Prefers to do PT at [facility]- referral done

## 2022-05-20 NOTE — Assessment & Plan Note (Signed)
Pt thinks this may be from pnd  Disc allergen avoidance Also tx gerd F/u for further eval in 1 wk

## 2022-05-26 ENCOUNTER — Ambulatory Visit: Payer: Medicare PPO | Admitting: Family Medicine

## 2022-06-01 ENCOUNTER — Ambulatory Visit: Payer: Medicare PPO | Admitting: Family Medicine

## 2022-06-14 DIAGNOSIS — H353231 Exudative age-related macular degeneration, bilateral, with active choroidal neovascularization: Secondary | ICD-10-CM | POA: Diagnosis not present

## 2022-06-25 DIAGNOSIS — Z09 Encounter for follow-up examination after completed treatment for conditions other than malignant neoplasm: Secondary | ICD-10-CM | POA: Diagnosis not present

## 2022-06-29 NOTE — Therapy (Signed)
OUTPATIENT PHYSICAL THERAPY NEURO EVALUATION   Patient Name: Sierra Bentley MRN: [id number] DOB:[birth year], 89 y.o., female Today's Date: 06/30/2022   PCP: Judy Pimple MD REFERRING PROVIDER: Judy Pimple MD   END OF SESSION:  PT End of Session - 06/30/22 1247     Visit Number 1    Number of Visits 24    Date for PT Re-Evaluation 09/22/22    Authorization Type 1/10 06/30/22    PT Start Time 1100    PT Stop Time 1156    PT Time Calculation (min) 56 min    Equipment Utilized During Treatment Gait belt    Activity Tolerance Patient tolerated treatment well    Behavior During Therapy WFL for tasks assessed/performed             Past Medical History:  Diagnosis Date   Allergic rhinitis, cause unspecified    Anemia    Breast cancer (HCC)    left breast IDC 2023   Chronically dry eyes, bilateral    Diverticulosis of colon    Endometrial polyp    Family history of prostate cancer 03/18/2021   Fuchs' corneal dystrophy of right eye    GERD (gastroesophageal reflux disease)    Hearing loss of both ears    pt stated has hearing aids but does not wear   History of epistaxis    per pt has had several cautization's for nose bleed   History of radiation therapy    05/20/21-06/10/21- Dr. Antony Blackbird   HTN (hypertension)    Hyperlipidemia    Macular degeneration of both eyes    followed @[facility]  by dr Demetrius Charity. mettu;   both age-related and right is with active choroidal neovascularization   OA (osteoarthritis)    OAB (overactive bladder)    Osteopenia    Thickened endometrium    Urge incontinence of urine    Wears glasses    Past Surgical History:  Procedure Laterality Date   BREAST BIOPSY Left 2011   fibrocystic change   BREAST LUMPECTOMY WITH RADIOACTIVE SEED LOCALIZATION Left 04/13/2021   Procedure: LEFT BREAST LUMPECTOMY WITH RADIOACTIVE SEED LOCALIZATION;  Surgeon: Emelia Loron, MD;  Location: [facility];  Service: General;  Laterality: Left;    CATARACT EXTRACTION W/ INTRAOCULAR LENS IMPLANT Bilateral 2008   COLONOSCOPY  2013   DILATATION & CURETTAGE/HYSTEROSCOPY WITH MYOSURE N/A 11/26/2020   Procedure: DILATATION & CURETTAGE/HYSTEROSCOPY WITH MYOSURE;  Surgeon: Genia Del, MD;  Location: [facility];  Service: Gynecology;  Laterality: N/A;   TONSILLECTOMY  1962   Patient Active Problem List   Diagnosis Date Noted   History of fall 05/20/2022   Sacral pain 05/20/2022   SOB (shortness of breath) 05/20/2022   Dysuria 05/20/2022   Grief reaction 06/14/2021   General weakness 06/12/2021   Current use of proton pump inhibitor 03/19/2021   Family history of prostate cancer 03/18/2021   Malignant neoplasm of upper-outer quadrant of left breast in female, estrogen receptor negative (HCC) 03/17/2021   Poor balance 05/07/2019   Exudative age-related macular degeneration, right eye, with active choroidal neovascularization (HCC) 09/04/2018   History of hip fracture 02/27/2018   Pedal edema 09/28/2017   Prediabetes 11/30/2016   At moderate risk for fall 09/05/2015   Cervical spondylosis without myelopathy 08/05/2015   Routine general medical examination at a health care facility 08/27/2014   Colon cancer screening 08/27/2014   Encounter for Medicare annual wellness exam 07/25/2013   Varicosities of leg 05/23/2012  Dry eye syndrome 04/14/2012   Nonexudative senile macular degeneration of retina 04/14/2012   Fuchs' corneal dystrophy 04/14/2012   Pseudophakia 04/14/2012   Abnormal ultrasound of thyroid gland 03/01/2011   Leg cramps 05/27/2009   Hyperlipidemia 06/11/2008   Allergic rhinitis 03/29/2008   Essential hypertension, benign 12/26/2006   GERD 12/26/2006   Rheumatoid arthritis (HCC) 12/26/2006   Osteoporosis 12/26/2006   URINARY INCONTINENCE 12/26/2006    ONSET DATE: a few months per patient report   REFERRING DIAG: poor balance, history of fall, moderate risk for fall   THERAPY DIAG:   Unsteadiness on feet  Muscle weakness (generalized)  Difficulty in walking, not elsewhere classified  Rationale for Evaluation and Treatment: Rehabilitation  SUBJECTIVE:                                                                                                                                                                                             SUBJECTIVE STATEMENT: Patient presents to physical therapy for balance.  Pt accompanied by: self  PERTINENT HISTORY:   Patient presents to physical therapy for balance and fall risk. Patient has been seen in the past at this clinic in 2021. PMH includes anemia, breast cancer 2023, diverticulosis of colon, Fuch's corneal dystrophy of R eye, GERD, bilateral hearing loss, HTN, HLD, macular degeneration of bilateral eyes, OA, OAB, osteopenia, urge incontinence. Patient had therapy a few months ago that was not helpful. Patient reports one fall about a month ago at home and another fall in [employer]; tripped on ramp and fell backwards.  Has dizziness with quick turns and leaning over.   PAIN:  Are you having pain?  Have headaches, affects balance  PRECAUTIONS: Fall  WEIGHT BEARING RESTRICTIONS: No  FALLS: Has patient fallen in last 6 months? Yes. Number of falls 2  LIVING ENVIRONMENT: Lives with: lives alone Lives in: House/apartment Stairs:  two levels, doesn't go upstairs, 4 stairs in garage, 2 in front, handrails can reach both Has following equipment at home: Quad cane small base and Walker - 2 wheeled  PLOF: Independent  PATIENT GOALS: to get stronger and more steady   OBJECTIVE:     COGNITION: Overall cognitive status: Within functional limits for tasks assessed   SENSATION: WFL  COORDINATION: Heel slide test: very challenging due to fatigue     POSTURE: rounded shoulders  Vitals: Seated: 113/64 Standing 110/68    LOWER EXTREMITY MMT:    MMT Right Eval Left Eval  Hip flexion 3+ 3+  Hip extension     Hip abduction 3+ 3+  Hip adduction 3+ 3+  Hip internal rotation  Hip external rotation    Knee flexion 4- 4-  Knee extension 3+ 4-  Ankle dorsiflexion 4- 4-  Ankle plantarflexion 4- 4-  Ankle inversion    Ankle eversion    (Blank rows = not tested)    TRANSFERS: Assistive device utilized: Single point cane  Sit to stand: SBA and CGA Stand to sit: SBA Chair to chair: CGA   GAIT: Gait pattern: step through pattern, decreased stride length, and narrow BOS Distance walked: 50 ft  Assistive device utilized:  hurrycane Level of assistance: CGA Comments: patient is challenged with quick steps   FUNCTIONAL TESTS:  5 times sit to stand: 22.71 10 meter walk test: 16.47 seconds with hurrycane  Berg Balance Scale: 28/56  PATIENT SURVEYS:  FOTO 40  TODAY'S TREATMENT:                                                                                                                              DATE: 06/30/22   Evaluation only    PATIENT EDUCATION: Education details: goals, POC Person educated: Patient Education method: Explanation, Demonstration, Tactile cues, and Verbal cues Education comprehension: verbalized understanding, returned demonstration, verbal cues required, and tactile cues required  HOME EXERCISE PROGRAM: Give next session   GOALS: Goals reviewed with patient? Yes  SHORT TERM GOALS: Target date: 07/28/2022    Patient will be independent in home exercise program to improve strength/mobility for better functional independence with ADLs Baseline: 6/5:  Goal status: INITIAL    LONG TERM GOALS: Target date: 09/22/2022    Patient will increase FOTO score to equal to or greater than   47  to demonstrate statistically significant improvement in mobility and quality of life.  Baseline: 6/5: 40%  Goal status: INITIAL  2. Patient (> 46 years old) will complete five times sit to stand test in < 15 seconds indicating an increased LE strength and improved  balance. Baseline: 6/5: 22.71 seconds with BUE support  Goal status: INITIAL  3.  Patient will increase 10 meter walk test to >1.36m/s as to improve gait speed for better community ambulation and to reduce fall risk. Baseline: 6/5: 16.47 seconds with hurrycane Goal status: INITIAL  4.   Patient will demonstrate an improved Berg Balance Score of > 38 as to demonstrate improved balance with ADLs such as sitting/standing and transfer balance and reduced fall risk.  Baseline: 6/5: 28/56 Goal status: INITIAL    ASSESSMENT:  CLINICAL IMPRESSION: Patient is an 89 y.o. female who was seen today for physical therapy evaluation and treatment for balance deficits . Patient has dizziness with turning that takes 26 seconds to resolve. Patient may benefit from screening from vestibular therapist. Blood pressure taken and no orthostasis noted with position change. Patient additionally has limited vision affecting her stability.  Patient is unstable as can be seen in BERG score with limited ability to be on leg. Her LE strength is limited bilaterally and will be a primary area  of focus. Patient will benefit from skilled physical therapy to increase strength, stability, and mobility for improved quality of life.    OBJECTIVE IMPAIRMENTS: Abnormal gait, decreased activity tolerance, decreased balance, decreased coordination, decreased endurance, decreased mobility, difficulty walking, decreased strength, dizziness, impaired perceived functional ability, impaired flexibility, impaired vision/preception, and improper body mechanics.   ACTIVITY LIMITATIONS: carrying, lifting, bending, standing, squatting, stairs, transfers, bed mobility, dressing, reach over head, locomotion level, and caring for others  PARTICIPATION LIMITATIONS: meal prep, cleaning, laundry, driving, shopping, community activity, and yard work  PERSONAL FACTORS: anemia, breast cancer 2023, diverticulosis of colon, Fuch's corneal dystrophy of R  eye, GERD, bilateral hearing loss, HTN, HLD, macular degeneration of bilateral eyes, OA, OAB, osteopenia, urge incontinence.  are also affecting patient's functional outcome.   REHAB POTENTIAL: Good  CLINICAL DECISION MAKING: Evolving/moderate complexity  EVALUATION COMPLEXITY: Moderate  PLAN:  PT FREQUENCY: 2x/week  PT DURATION: 12 weeks  PLANNED INTERVENTIONS: Therapeutic exercises, Therapeutic activity, Neuromuscular re-education, Balance training, Gait training, Patient/Family education, Self Care, Joint mobilization, Stair training, Vestibular training, Canalith repositioning, Visual/preceptual remediation/compensation, DME instructions, Electrical stimulation, Cryotherapy, Moist heat, Manual lymph drainage, Splintting, Taping, Traction, Ultrasound, Manual therapy, and Re-evaluation  PLAN FOR NEXT SESSION: give HEP,  balance, strengthen LE, head turns    Precious Bard, PT 06/30/2022, 12:48 PM

## 2022-06-30 ENCOUNTER — Ambulatory Visit: Payer: Medicare PPO | Attending: Family Medicine

## 2022-06-30 DIAGNOSIS — Z9181 History of falling: Secondary | ICD-10-CM | POA: Diagnosis not present

## 2022-06-30 DIAGNOSIS — R262 Difficulty in walking, not elsewhere classified: Secondary | ICD-10-CM | POA: Insufficient documentation

## 2022-06-30 DIAGNOSIS — R2681 Unsteadiness on feet: Secondary | ICD-10-CM | POA: Diagnosis not present

## 2022-06-30 DIAGNOSIS — M6281 Muscle weakness (generalized): Secondary | ICD-10-CM | POA: Insufficient documentation

## 2022-06-30 DIAGNOSIS — R2689 Other abnormalities of gait and mobility: Secondary | ICD-10-CM | POA: Insufficient documentation

## 2022-07-06 NOTE — Therapy (Signed)
OUTPATIENT PHYSICAL THERAPY NEURO TREATMENT NOTE   Patient Name: Sierra Bentley MRN: [id number] DOB:1933-02-21, 89 y.o., female Today's Date: 07/07/2022   PCP: Judy Pimple MD REFERRING PROVIDER: Judy Pimple MD   END OF SESSION:  PT End of Session - 07/07/22 1057     Visit Number 2    Number of Visits 24    Date for PT Re-Evaluation 09/22/22    Authorization Type 1/10 06/30/22    PT Start Time 1100    PT Stop Time 1143    PT Time Calculation (min) 43 min    Equipment Utilized During Treatment Gait belt    Activity Tolerance Patient tolerated treatment well    Behavior During Therapy WFL for tasks assessed/performed              Past Medical History:  Diagnosis Date   Allergic rhinitis, cause unspecified    Anemia    Breast cancer (HCC)    left breast IDC 2023   Chronically dry eyes, bilateral    Diverticulosis of colon    Endometrial polyp    Family history of prostate cancer 03/18/2021   Fuchs' corneal dystrophy of right eye    GERD (gastroesophageal reflux disease)    Hearing loss of both ears    pt stated has hearing aids but does not wear   History of epistaxis    per pt has had several cautization's for nose bleed   History of radiation therapy    05/20/21-06/10/21- Dr. Antony Blackbird   HTN (hypertension)    Hyperlipidemia    Macular degeneration of both eyes    followed @[facility]  by dr Demetrius Charity. mettu;   both age-related and right is with active choroidal neovascularization   OA (osteoarthritis)    OAB (overactive bladder)    Osteopenia    Thickened endometrium    Urge incontinence of urine    Wears glasses    Past Surgical History:  Procedure Laterality Date   BREAST BIOPSY Left 2011   fibrocystic change   BREAST LUMPECTOMY WITH RADIOACTIVE SEED LOCALIZATION Left 04/13/2021   Procedure: LEFT BREAST LUMPECTOMY WITH RADIOACTIVE SEED LOCALIZATION;  Surgeon: Emelia Loron, MD;  Location: [facility];  Service: General;  Laterality: Left;    CATARACT EXTRACTION W/ INTRAOCULAR LENS IMPLANT Bilateral 2008   COLONOSCOPY  2013   DILATATION & CURETTAGE/HYSTEROSCOPY WITH MYOSURE N/A 11/26/2020   Procedure: DILATATION & CURETTAGE/HYSTEROSCOPY WITH MYOSURE;  Surgeon: Genia Del, MD;  Location: [facility];  Service: Gynecology;  Laterality: N/A;   TONSILLECTOMY  1962   Patient Active Problem List   Diagnosis Date Noted   History of fall 05/20/2022   Sacral pain 05/20/2022   SOB (shortness of breath) 05/20/2022   Dysuria 05/20/2022   Grief reaction 06/14/2021   General weakness 06/12/2021   Current use of proton pump inhibitor 03/19/2021   Family history of prostate cancer 03/18/2021   Malignant neoplasm of upper-outer quadrant of left breast in female, estrogen receptor negative (HCC) 03/17/2021   Poor balance 05/07/2019   Exudative age-related macular degeneration, right eye, with active choroidal neovascularization (HCC) 09/04/2018   History of hip fracture 02/27/2018   Pedal edema 09/28/2017   Prediabetes 11/30/2016   At moderate risk for fall 09/05/2015   Cervical spondylosis without myelopathy 08/05/2015   Routine general medical examination at a health care facility 08/27/2014   Colon cancer screening 08/27/2014   Encounter for Medicare annual wellness exam 07/25/2013   Varicosities of leg 05/23/2012  Dry eye syndrome 04/14/2012   Nonexudative senile macular degeneration of retina 04/14/2012   Fuchs' corneal dystrophy 04/14/2012   Pseudophakia 04/14/2012   Abnormal ultrasound of thyroid gland 03/01/2011   Leg cramps 05/27/2009   Hyperlipidemia 06/11/2008   Allergic rhinitis 03/29/2008   Essential hypertension, benign 12/26/2006   GERD 12/26/2006   Rheumatoid arthritis (HCC) 12/26/2006   Osteoporosis 12/26/2006   URINARY INCONTINENCE 12/26/2006    ONSET DATE: a few months per patient report   REFERRING DIAG: poor balance, history of fall, moderate risk for fall   THERAPY DIAG:   Unsteadiness on feet  Muscle weakness (generalized)  Difficulty in walking, not elsewhere classified  Rationale for Evaluation and Treatment: Rehabilitation  SUBJECTIVE:                                                                                                                                                                                             SUBJECTIVE STATEMENT: Denies pain, states she is feeling off balance a little and wants to work on strengthening.  Pt accompanied by: self  PERTINENT HISTORY:   Patient presents to physical therapy for balance and fall risk. Patient has been seen in the past at this clinic in 2021. PMH includes anemia, breast cancer 2023, diverticulosis of colon, Fuch's corneal dystrophy of R eye, GERD, bilateral hearing loss, HTN, HLD, macular degeneration of bilateral eyes, OA, OAB, osteopenia, urge incontinence. Patient had therapy a few months ago that was not helpful. Patient reports one fall about a month ago at home and another fall in [employer]; tripped on ramp and fell backwards.  Has dizziness with quick turns and leaning over.   PAIN:  Are you having pain?  Have headaches, affects balance  PRECAUTIONS: Fall  WEIGHT BEARING RESTRICTIONS: No  FALLS: Has patient fallen in last 6 months? Yes. Number of falls 2  LIVING ENVIRONMENT: Lives with: lives alone Lives in: House/apartment Stairs:  two levels, doesn't go upstairs, 4 stairs in garage, 2 in front, handrails can reach both Has following equipment at home: Quad cane small base and Walker - 2 wheeled  PLOF: Independent  PATIENT GOALS: to get stronger and more steady   OBJECTIVE:     COGNITION: Overall cognitive status: Within functional limits for tasks assessed   SENSATION: WFL  COORDINATION: Heel slide test: very challenging due to fatigue     POSTURE: rounded shoulders  Vitals: Seated: 113/64 Standing 110/68    LOWER EXTREMITY MMT:    MMT Right Eval  Left Eval  Hip flexion 3+ 3+  Hip extension    Hip abduction 3+ 3+  Hip adduction  3+ 3+  Hip internal rotation    Hip external rotation    Knee flexion 4- 4-  Knee extension 3+ 4-  Ankle dorsiflexion 4- 4-  Ankle plantarflexion 4- 4-  Ankle inversion    Ankle eversion    (Blank rows = not tested)    TRANSFERS: Assistive device utilized: Single point cane  Sit to stand: SBA and CGA Stand to sit: SBA Chair to chair: CGA   GAIT: Gait pattern: step through pattern, decreased stride length, and narrow BOS Distance walked: 50 ft  Assistive device utilized:  hurrycane Level of assistance: CGA Comments: patient is challenged with quick steps   FUNCTIONAL TESTS:  5 times sit to stand: 22.71 10 meter walk test: 16.47 seconds with hurrycane  Berg Balance Scale: 28/56  PATIENT SURVEYS:  FOTO 40  TODAY'S TREATMENT:                                                                                                                              DATE: 07/07/22   Unless otherwise stated, CGA was provided and gait belt donned in order to ensure pt safety. Hurrycane used for mobility tasks.   TherEx: (22 min) -2 x 10 x 3 sec hold: seated knee marches for improved strength of hip flexors; second trial no back support for core activation. Pt reports feeling significantly weaker on the R side. Added to HEP. 6/10 RLE, RPE compared to 3/10 on LLE -2 x 10 x 3 sec hold: seated knee ext for quad, added to HEP -ambulation 150'  -10 x STS for cue on eccentric quad control (6 sec) pt reported "difficult"  TherAct:  (8 min) Pt education re: HEP plan  STS throughout sessions  NMRE: (8 min) - 3:20 min: standing at whiteboard with visual scanning activity with magnets during hangman activity for improved standing balance with head turning  - 4:30 min: standing on Airex pad w/ hangman for balance and targeting ankle strategy. Of note, pt needs help with some of the letters due to vision impairment.      PATIENT EDUCATION: Education details: goals, POC Person educated: Patient Education method: Explanation, Demonstration, Tactile cues, and Verbal cues Education comprehension: verbalized understanding, returned demonstration, verbal cues required, and tactile cues required  HOME EXERCISE PROGRAM: Access Code: [email or web address] URL: [email or web address] Date: 07/07/2022 Prepared by: Precious Bard Exercises - Seated March  - 1 x daily - 7 x weekly - 2 sets - 10 reps - 3 hold - Seated Long Arc Quad  - 1 x daily - 7 x weekly - 2 sets - 10 reps - 3 hold - Sit to Stand with Armchair  - 1 x daily - 7 x weekly - 1 sets - 5 reps - 5 hold   GOALS: Goals reviewed with patient? Yes  SHORT TERM GOALS: Target date: 07/28/2022    Patient will be independent in home exercise program to improve strength/mobility for better functional independence with ADLs Baseline:  6/5:  Goal status: INITIAL    LONG TERM GOALS: Target date: 09/22/2022    Patient will increase FOTO score to equal to or greater than   47  to demonstrate statistically significant improvement in mobility and quality of life.  Baseline: 6/5: 40%  Goal status: INITIAL  2. Patient (> 22 years old) will complete five times sit to stand test in < 15 seconds indicating an increased LE strength and improved balance. Baseline: 6/5: 22.71 seconds with BUE support  Goal status: INITIAL  3.  Patient will increase 10 meter walk test to >1.37m/s as to improve gait speed for better community ambulation and to reduce fall risk. Baseline: 6/5: 16.47 seconds with hurrycane Goal status: INITIAL  4.   Patient will demonstrate an improved Berg Balance Score of > 38 as to demonstrate improved balance with ADLs such as sitting/standing and transfer balance and reduced fall risk.  Baseline: 6/5: 28/56 Goal status: INITIAL    ASSESSMENT:  CLINICAL IMPRESSION: Pt presents to skilled PT motivated to participate and requests to do some BLE  strengthening. During therex activities, pt reported her RLE is significantly harder than her LLE regarding quad strength. Pt enjoyed balancing activities with magnets, but does requiring help with letter identification due to vision impairments. Home exercise program was initiated today, and thepaist will follow up with it next session. Patient will benefit from skilled physical therapy to increase strength, stability, and mobility for improved quality of life.    OBJECTIVE IMPAIRMENTS: Abnormal gait, decreased activity tolerance, decreased balance, decreased coordination, decreased endurance, decreased mobility, difficulty walking, decreased strength, dizziness, impaired perceived functional ability, impaired flexibility, impaired vision/preception, and improper body mechanics.   ACTIVITY LIMITATIONS: carrying, lifting, bending, standing, squatting, stairs, transfers, bed mobility, dressing, reach over head, locomotion level, and caring for others  PARTICIPATION LIMITATIONS: meal prep, cleaning, laundry, driving, shopping, community activity, and yard work  PERSONAL FACTORS: anemia, breast cancer 2023, diverticulosis of colon, Fuch's corneal dystrophy of R eye, GERD, bilateral hearing loss, HTN, HLD, macular degeneration of bilateral eyes, OA, OAB, osteopenia, urge incontinence.  are also affecting patient's functional outcome.   REHAB POTENTIAL: Good  CLINICAL DECISION MAKING: Evolving/moderate complexity  EVALUATION COMPLEXITY: Moderate  PLAN:  PT FREQUENCY: 2x/week  PT DURATION: 12 weeks  PLANNED INTERVENTIONS: Therapeutic exercises, Therapeutic activity, Neuromuscular re-education, Balance training, Gait training, Patient/Family education, Self Care, Joint mobilization, Stair training, Vestibular training, Canalith repositioning, Visual/preceptual remediation/compensation, DME instructions, Electrical stimulation, Cryotherapy, Moist heat, Manual lymph drainage, Splintting, Taping,  Traction, Ultrasound, Manual therapy, and Re-evaluation  PLAN FOR NEXT SESSION: progress balance, strengthen LE, head turns with amb, possibly due a TUG, vestibular screen w/ Corlis Leak, PT 07/07/2022, 12:01 PM

## 2022-07-07 ENCOUNTER — Ambulatory Visit: Payer: Medicare PPO

## 2022-07-07 DIAGNOSIS — R2681 Unsteadiness on feet: Secondary | ICD-10-CM | POA: Diagnosis not present

## 2022-07-07 DIAGNOSIS — R262 Difficulty in walking, not elsewhere classified: Secondary | ICD-10-CM | POA: Diagnosis not present

## 2022-07-07 DIAGNOSIS — M6281 Muscle weakness (generalized): Secondary | ICD-10-CM | POA: Diagnosis not present

## 2022-07-07 DIAGNOSIS — R2689 Other abnormalities of gait and mobility: Secondary | ICD-10-CM | POA: Diagnosis not present

## 2022-07-07 DIAGNOSIS — Z9181 History of falling: Secondary | ICD-10-CM | POA: Diagnosis not present

## 2022-07-08 NOTE — Therapy (Signed)
OUTPATIENT PHYSICAL THERAPY NEURO TREATMENT NOTE   Patient Name: Sierra Bentley MRN: [id number] DOB:Sep 28, 1933, 89 y.o., female Today's Date: 07/12/2022   PCP: Judy Pimple MD REFERRING PROVIDER: Judy Pimple MD   END OF SESSION:  PT End of Session - 07/12/22 1347     Visit Number 3    Number of Visits 24    Date for PT Re-Evaluation 09/22/22    Authorization Type 1/10 06/30/22    PT Start Time 1348    PT Stop Time 1429    PT Time Calculation (min) 41 min    Equipment Utilized During Treatment Gait belt    Activity Tolerance Patient tolerated treatment well    Behavior During Therapy WFL for tasks assessed/performed               Past Medical History:  Diagnosis Date   Allergic rhinitis, cause unspecified    Anemia    Breast cancer (HCC)    left breast IDC 2023   Chronically dry eyes, bilateral    Diverticulosis of colon    Endometrial polyp    Family history of prostate cancer 03/18/2021   Fuchs' corneal dystrophy of right eye    GERD (gastroesophageal reflux disease)    Hearing loss of both ears    pt stated has hearing aids but does not wear   History of epistaxis    per pt has had several cautization's for nose bleed   History of radiation therapy    05/20/21-06/10/21- Dr. Antony Blackbird   HTN (hypertension)    Hyperlipidemia    Macular degeneration of both eyes    followed @[facility]  by dr Demetrius Charity. mettu;   both age-related and right is with active choroidal neovascularization   OA (osteoarthritis)    OAB (overactive bladder)    Osteopenia    Thickened endometrium    Urge incontinence of urine    Wears glasses    Past Surgical History:  Procedure Laterality Date   BREAST BIOPSY Left 2011   fibrocystic change   BREAST LUMPECTOMY WITH RADIOACTIVE SEED LOCALIZATION Left 04/13/2021   Procedure: LEFT BREAST LUMPECTOMY WITH RADIOACTIVE SEED LOCALIZATION;  Surgeon: Emelia Loron, MD;  Location: [facility];  Service: General;  Laterality:  Left;   CATARACT EXTRACTION W/ INTRAOCULAR LENS IMPLANT Bilateral 2008   COLONOSCOPY  2013   DILATATION & CURETTAGE/HYSTEROSCOPY WITH MYOSURE N/A 11/26/2020   Procedure: DILATATION & CURETTAGE/HYSTEROSCOPY WITH MYOSURE;  Surgeon: Genia Del, MD;  Location: [facility];  Service: Gynecology;  Laterality: N/A;   TONSILLECTOMY  1962   Patient Active Problem List   Diagnosis Date Noted   History of fall 05/20/2022   Sacral pain 05/20/2022   SOB (shortness of breath) 05/20/2022   Dysuria 05/20/2022   Grief reaction 06/14/2021   General weakness 06/12/2021   Current use of proton pump inhibitor 03/19/2021   Family history of prostate cancer 03/18/2021   Malignant neoplasm of upper-outer quadrant of left breast in female, estrogen receptor negative (HCC) 03/17/2021   Poor balance 05/07/2019   Exudative age-related macular degeneration, right eye, with active choroidal neovascularization (HCC) 09/04/2018   History of hip fracture 02/27/2018   Pedal edema 09/28/2017   Prediabetes 11/30/2016   At moderate risk for fall 09/05/2015   Cervical spondylosis without myelopathy 08/05/2015   Routine general medical examination at a health care facility 08/27/2014   Colon cancer screening 08/27/2014   Encounter for Medicare annual wellness exam 07/25/2013   Varicosities of leg  05/23/2012   Dry eye syndrome 04/14/2012   Nonexudative senile macular degeneration of retina 04/14/2012   Fuchs' corneal dystrophy 04/14/2012   Pseudophakia 04/14/2012   Abnormal ultrasound of thyroid gland 03/01/2011   Leg cramps 05/27/2009   Hyperlipidemia 06/11/2008   Allergic rhinitis 03/29/2008   Essential hypertension, benign 12/26/2006   GERD 12/26/2006   Rheumatoid arthritis (HCC) 12/26/2006   Osteoporosis 12/26/2006   URINARY INCONTINENCE 12/26/2006    ONSET DATE: a few months per patient report   REFERRING DIAG: poor balance, history of fall, moderate risk for fall   THERAPY DIAG:   Unsteadiness on feet  Muscle weakness (generalized)  Difficulty in walking, not elsewhere classified  Rationale for Evaluation and Treatment: Rehabilitation  SUBJECTIVE:                                                                                                                                                                                             SUBJECTIVE STATEMENT: I am feeling a bit tired today, but ready to work hard.  Pt accompanied by: self  PERTINENT HISTORY:   Patient presents to physical therapy for balance and fall risk. Patient has been seen in the past at this clinic in 2021. PMH includes anemia, breast cancer 2023, diverticulosis of colon, Fuch's corneal dystrophy of R eye, GERD, bilateral hearing loss, HTN, HLD, macular degeneration of bilateral eyes, OA, OAB, osteopenia, urge incontinence. Patient had therapy a few months ago that was not helpful. Patient reports one fall about a month ago at home and another fall in [employer]; tripped on ramp and fell backwards.  Has dizziness with quick turns and leaning over.   PAIN:  Are you having pain?  Have headaches, affects balance None currently.  PRECAUTIONS: Fall  WEIGHT BEARING RESTRICTIONS: No  FALLS: Has patient fallen in last 6 months? Yes. Number of falls 2  LIVING ENVIRONMENT: Lives with: lives alone Lives in: House/apartment Stairs:  two levels, doesn't go upstairs, 4 stairs in garage, 2 in front, handrails can reach both Has following equipment at home: Quad cane small base and Walker - 2 wheeled  PLOF: Independent  PATIENT GOALS: to get stronger and more steady   OBJECTIVE:     COGNITION: Overall cognitive status: Within functional limits for tasks assessed   SENSATION: WFL  COORDINATION: Heel slide test: very challenging due to fatigue     POSTURE: rounded shoulders  Vitals: Seated: 113/64 Standing 110/68    LOWER EXTREMITY MMT:    MMT Right Eval Left Eval  Hip flexion  3+ 3+  Hip extension    Hip abduction 3+ 3+  Hip  adduction 3+ 3+  Hip internal rotation    Hip external rotation    Knee flexion 4- 4-  Knee extension 3+ 4-  Ankle dorsiflexion 4- 4-  Ankle plantarflexion 4- 4-  Ankle inversion    Ankle eversion    (Blank rows = not tested)    TRANSFERS: Assistive device utilized: Single point cane  Sit to stand: SBA and CGA Stand to sit: SBA Chair to chair: CGA   GAIT: Gait pattern: step through pattern, decreased stride length, and narrow BOS Distance walked: 50 ft  Assistive device utilized:  hurrycane Level of assistance: CGA Comments: patient is challenged with quick steps   FUNCTIONAL TESTS:  5 times sit to stand: 22.71 10 meter walk test: 16.47 seconds with hurrycane  Berg Balance Scale: 28/56  PATIENT SURVEYS:  FOTO 40  TODAY'S TREATMENT:                                                                                                                              DATE: 07/12/22   Unless otherwise stated, CGA was provided and gait belt donned in order to ensure pt safety. Hurrycane used for mobility tasks.   TherEx: (26 min) -Ambulation x 8:18 min, until pt reported fatigue requiring a seated rest break for assessment of endurance; total 817'. -2 x 10 x 3 sec hold: seated knee marches for improved hip flexor strength -2 x 75' amb, w/ pt requested increase in pace -2 x 10 B: standing marches with 2# AW w/ cane in R hand; cues to keep knee and toes facing fwd > hip IR. Pt reported "difficult".  TherAct:   assess carry over from HEP of above therex Educated about water intake, straw was recommended to help pt drink more.  Pt education re: HEP plan  STS throughout sessions w/ emphasis on eccentric control   NMRE: (13 min)  -stepping over 4 AW placed 3' apart on floor to simulate stepping over cracks. SPT placed hand on small of back to encourage slightly faster pace, pt responded well to cue and foot clearance improved.  -10  x standing head turns; pt reports dizziness with full turns ~45 deg ROM, with 20 sec of time to recover. -25' walk with turns to find pictures for scanning        PATIENT EDUCATION: Education details: goals, POC Person educated: Patient Education method: Explanation, Demonstration, Tactile cues, and Verbal cues Education comprehension: verbalized understanding, returned demonstration, verbal cues required, and tactile cues required  HOME EXERCISE PROGRAM: Access Code: [email or web address] URL: [email or web address] Date: 07/07/2022 Prepared by: Precious Bard Exercises - Seated March  - 1 x daily - 7 x weekly - 2 sets - 10 reps - 3 hold - Seated Long Arc Quad  - 1 x daily - 7 x weekly - 2 sets - 10 reps - 3 hold - Sit to Stand with Armchair  - 1 x daily - 7 x weekly - 1 sets -  5 reps - 5 hold   GOALS: Goals reviewed with patient? Yes  SHORT TERM GOALS: Target date: 07/28/2022    Patient will be independent in home exercise program to improve strength/mobility for better functional independence with ADLs Baseline: 6/5:  Goal status: INITIAL    LONG TERM GOALS: Target date: 09/22/2022    Patient will increase FOTO score to equal to or greater than   47  to demonstrate statistically significant improvement in mobility and quality of life.  Baseline: 6/5: 40%  Goal status: INITIAL  2. Patient (> 33 years old) will complete five times sit to stand test in < 15 seconds indicating an increased LE strength and improved balance. Baseline: 6/5: 22.71 seconds with BUE support  Goal status: INITIAL  3.  Patient will increase 10 meter walk test to >1.31m/s as to improve gait speed for better community ambulation and to reduce fall risk. Baseline: 6/5: 16.47 seconds with hurrycane Goal status: INITIAL  4.   Patient will demonstrate an improved Berg Balance Score of > 38 as to demonstrate improved balance with ADLs such as sitting/standing and transfer balance and reduced fall risk.   Baseline: 6/5: 28/56 Goal status: INITIAL    ASSESSMENT:  CLINICAL IMPRESSION: Pt presents to skilled PT, reporting she is a bit more fatigued from a busy morning of errands. Despite feeling tired, pt felt her energy improve after ambulation, which she was able to do for 8:18 min, achieving 817' before needing a rest break. Therex and balance tasks were progressed today to include adding 2# AW, which pt would continue to benefit from to improve hip flexor strength for foot clearance. Of note, pt reports she has started walking less partly from not feeling confident with uneven surfaces such as side walks, and would benefit from practicing obstacle navigation. Additionally, side-side head turns make pt dizzy, and she would benefit from meeting with Temple Pacini, DTaP for vestibular screening. Patient will benefit from skilled physical therapy to increase strength, stability, and mobility for improved quality of life.    OBJECTIVE IMPAIRMENTS: Abnormal gait, decreased activity tolerance, decreased balance, decreased coordination, decreased endurance, decreased mobility, difficulty walking, decreased strength, dizziness, impaired perceived functional ability, impaired flexibility, impaired vision/preception, and improper body mechanics.   ACTIVITY LIMITATIONS: carrying, lifting, bending, standing, squatting, stairs, transfers, bed mobility, dressing, reach over head, locomotion level, and caring for others  PARTICIPATION LIMITATIONS: meal prep, cleaning, laundry, driving, shopping, community activity, and yard work  PERSONAL FACTORS: anemia, breast cancer 2023, diverticulosis of colon, Fuch's corneal dystrophy of R eye, GERD, bilateral hearing loss, HTN, HLD, macular degeneration of bilateral eyes, OA, OAB, osteopenia, urge incontinence.  are also affecting patient's functional outcome.   REHAB POTENTIAL: Good  CLINICAL DECISION MAKING: Evolving/moderate complexity  EVALUATION COMPLEXITY:  Moderate  PLAN:  PT FREQUENCY: 2x/week  PT DURATION: 12 weeks  PLANNED INTERVENTIONS: Therapeutic exercises, Therapeutic activity, Neuromuscular re-education, Balance training, Gait training, Patient/Family education, Self Care, Joint mobilization, Stair training, Vestibular training, Canalith repositioning, Visual/preceptual remediation/compensation, DME instructions, Electrical stimulation, Cryotherapy, Moist heat, Manual lymph drainage, Splintting, Taping, Traction, Ultrasound, Manual therapy, and Re-evaluation  PLAN FOR NEXT SESSION: progress ambulation w/ obstacle course. LE strengthening   Glynn Octave, SPT  This entire session was performed under direct supervision and direction of a licensed therapist/therapist assistant . I have personally read, edited and approve of the note as written.  Precious Bard, PT 07/12/2022, 5:13 PM

## 2022-07-12 ENCOUNTER — Ambulatory Visit: Payer: Medicare PPO

## 2022-07-12 DIAGNOSIS — M6281 Muscle weakness (generalized): Secondary | ICD-10-CM

## 2022-07-12 DIAGNOSIS — R2689 Other abnormalities of gait and mobility: Secondary | ICD-10-CM | POA: Diagnosis not present

## 2022-07-12 DIAGNOSIS — Z9181 History of falling: Secondary | ICD-10-CM | POA: Diagnosis not present

## 2022-07-12 DIAGNOSIS — R262 Difficulty in walking, not elsewhere classified: Secondary | ICD-10-CM | POA: Diagnosis not present

## 2022-07-12 DIAGNOSIS — R2681 Unsteadiness on feet: Secondary | ICD-10-CM | POA: Diagnosis not present

## 2022-07-13 NOTE — Therapy (Signed)
OUTPATIENT PHYSICAL THERAPY NEURO TREATMENT NOTE   Patient Name: Sierra Bentley MRN: [id number] DOB:06-16-1933, 89 y.o., female Today's Date: 07/14/2022   PCP: Judy Pimple MD REFERRING PROVIDER: Judy Pimple MD   END OF SESSION:  PT End of Session - 07/14/22 1200     Visit Number 4    Number of Visits 24    Date for PT Re-Evaluation 09/22/22    Authorization Type 1/10 06/30/22    PT Start Time 1110    PT Stop Time 1144    PT Time Calculation (min) 34 min    Equipment Utilized During Treatment Gait belt    Activity Tolerance Patient tolerated treatment well    Behavior During Therapy WFL for tasks assessed/performed                Past Medical History:  Diagnosis Date   Allergic rhinitis, cause unspecified    Anemia    Breast cancer (HCC)    left breast IDC 2023   Chronically dry eyes, bilateral    Diverticulosis of colon    Endometrial polyp    Family history of prostate cancer 03/18/2021   Fuchs' corneal dystrophy of right eye    GERD (gastroesophageal reflux disease)    Hearing loss of both ears    pt stated has hearing aids but does not wear   History of epistaxis    per pt has had several cautization's for nose bleed   History of radiation therapy    05/20/21-06/10/21- Dr. Antony Blackbird   HTN (hypertension)    Hyperlipidemia    Macular degeneration of both eyes    followed @[facility]  by dr Demetrius Charity. mettu;   both age-related and right is with active choroidal neovascularization   OA (osteoarthritis)    OAB (overactive bladder)    Osteopenia    Thickened endometrium    Urge incontinence of urine    Wears glasses    Past Surgical History:  Procedure Laterality Date   BREAST BIOPSY Left 2011   fibrocystic change   BREAST LUMPECTOMY WITH RADIOACTIVE SEED LOCALIZATION Left 04/13/2021   Procedure: LEFT BREAST LUMPECTOMY WITH RADIOACTIVE SEED LOCALIZATION;  Surgeon: Emelia Loron, MD;  Location: [facility];  Service: General;  Laterality:  Left;   CATARACT EXTRACTION W/ INTRAOCULAR LENS IMPLANT Bilateral 2008   COLONOSCOPY  2013   DILATATION & CURETTAGE/HYSTEROSCOPY WITH MYOSURE N/A 11/26/2020   Procedure: DILATATION & CURETTAGE/HYSTEROSCOPY WITH MYOSURE;  Surgeon: Genia Del, MD;  Location: [facility];  Service: Gynecology;  Laterality: N/A;   TONSILLECTOMY  1962   Patient Active Problem List   Diagnosis Date Noted   History of fall 05/20/2022   Sacral pain 05/20/2022   SOB (shortness of breath) 05/20/2022   Dysuria 05/20/2022   Grief reaction 06/14/2021   General weakness 06/12/2021   Current use of proton pump inhibitor 03/19/2021   Family history of prostate cancer 03/18/2021   Malignant neoplasm of upper-outer quadrant of left breast in female, estrogen receptor negative (HCC) 03/17/2021   Poor balance 05/07/2019   Exudative age-related macular degeneration, right eye, with active choroidal neovascularization (HCC) 09/04/2018   History of hip fracture 02/27/2018   Pedal edema 09/28/2017   Prediabetes 11/30/2016   At moderate risk for fall 09/05/2015   Cervical spondylosis without myelopathy 08/05/2015   Routine general medical examination at a health care facility 08/27/2014   Colon cancer screening 08/27/2014   Encounter for Medicare annual wellness exam 07/25/2013   Varicosities of  leg 05/23/2012   Dry eye syndrome 04/14/2012   Nonexudative senile macular degeneration of retina 04/14/2012   Fuchs' corneal dystrophy 04/14/2012   Pseudophakia 04/14/2012   Abnormal ultrasound of thyroid gland 03/01/2011   Leg cramps 05/27/2009   Hyperlipidemia 06/11/2008   Allergic rhinitis 03/29/2008   Essential hypertension, benign 12/26/2006   GERD 12/26/2006   Rheumatoid arthritis (HCC) 12/26/2006   Osteoporosis 12/26/2006   URINARY INCONTINENCE 12/26/2006    ONSET DATE: a few months per patient report   REFERRING DIAG: poor balance, history of fall, moderate risk for fall   THERAPY DIAG:   Unsteadiness on feet  Muscle weakness (generalized)  Difficulty in walking, not elsewhere classified  Rationale for Evaluation and Treatment: Rehabilitation  SUBJECTIVE:                                                                                                                                                                                             SUBJECTIVE STATEMENT: Pt presented to therapy late. Pt explained she had fallen asleep and woke up late.   Pt accompanied by: self  PERTINENT HISTORY:   Patient presents to physical therapy for balance and fall risk. Patient has been seen in the past at this clinic in 2021. PMH includes anemia, breast cancer 2023, diverticulosis of colon, Fuch's corneal dystrophy of R eye, GERD, bilateral hearing loss, HTN, HLD, macular degeneration of bilateral eyes, OA, OAB, osteopenia, urge incontinence. Patient had therapy a few months ago that was not helpful. Patient reports one fall about a month ago at home and another fall in [employer]; tripped on ramp and fell backwards.  Has dizziness with quick turns and leaning over.   PAIN:  Are you having pain?  Have headaches, affects balance None currently.  PRECAUTIONS: Fall  WEIGHT BEARING RESTRICTIONS: No  FALLS: Has patient fallen in last 6 months? Yes. Number of falls 2  LIVING ENVIRONMENT: Lives with: lives alone Lives in: House/apartment Stairs:  two levels, doesn't go upstairs, 4 stairs in garage, 2 in front, handrails can reach both Has following equipment at home: Quad cane small base and Walker - 2 wheeled  PLOF: Independent  PATIENT GOALS: to get stronger and more steady   OBJECTIVE:     COGNITION: Overall cognitive status: Within functional limits for tasks assessed   SENSATION: WFL  COORDINATION: Heel slide test: very challenging due to fatigue     POSTURE: rounded shoulders  Vitals: Seated: 113/64 Standing 110/68    LOWER EXTREMITY MMT:    MMT  Right Eval Left Eval  Hip flexion 3+ 3+  Hip extension    Hip  abduction 3+ 3+  Hip adduction 3+ 3+  Hip internal rotation    Hip external rotation    Knee flexion 4- 4-  Knee extension 3+ 4-  Ankle dorsiflexion 4- 4-  Ankle plantarflexion 4- 4-  Ankle inversion    Ankle eversion    (Blank rows = not tested)    TRANSFERS: Assistive device utilized: Single point cane  Sit to stand: SBA and CGA Stand to sit: SBA Chair to chair: CGA   GAIT: Gait pattern: step through pattern, decreased stride length, and narrow BOS Distance walked: 50 ft  Assistive device utilized:  hurrycane Level of assistance: CGA Comments: patient is challenged with quick steps   FUNCTIONAL TESTS:  5 times sit to stand: 22.71 10 meter walk test: 16.47 seconds with hurrycane  Berg Balance Scale: 28/56  PATIENT SURVEYS:  FOTO 40  TODAY'S TREATMENT:                                                                                                                              DATE: 07/14/22   Unless otherwise stated, CGA was provided and gait belt donned in order to ensure pt safety. Hurrycane used for mobility tasks.   TherEx: ( ) -1x 10 x 3 sec hold: seated knee marches for improved hip flexor strength with 2# AW. Hands across chest and upright posture to target core & posture as well.  -1 x 10 x 3 sec B: leg extensions with 2# AW "medium"  TherAct:   STS throughout sessions w/ emphasis on eccentric control   NMRE: (10 min)  -2 trials: tandem standing on Airex pad during color coating activity w/ magnets for balance and ankle strategy   -trial 1: 1:26 min   -trial 2: 1:11 min   -2 x B: SLS for improved glute med strength for hip stabilization at whiteboard for tic-tac-toe activity  Gait Training: (20 min)  -6 laps re: obstacle course; bolsters and hedgehogs to navigate stepping strategy, foot clearance, and BOS while managing AD. Multiple layouts set up to include longitunal walking along  bolster, pt needed minA to maintain balance.    PATIENT EDUCATION: Education details: goals, POC Person educated: Patient Education method: Explanation, Demonstration, Tactile cues, and Verbal cues Education comprehension: verbalized understanding, returned demonstration, verbal cues required, and tactile cues required  HOME EXERCISE PROGRAM: Access Code: [email or web address] URL: [email or web address] Date: 07/14/2022 Prepared by: Precious Bard  Exercises - Seated Heel Toe Raises  - 1 x daily - 7 x weekly - 2 sets - 10 reps - 5 hold - Standing Tandem Balance with Counter Support  - 1 x daily - 7 x weekly - 2 sets - 2 reps - 30 hold - Standing March with Counter Support  - 1 x daily - 7 x weekly - 2 sets - 10 reps - 5 hold - Ankle Inversion Eversion Towel Slide  - 1 x daily - 7 x weekly - 1 sets - 15 reps -  5 hold  GOALS: Goals reviewed with patient? Yes  SHORT TERM GOALS: Target date: 07/28/2022    Patient will be independent in home exercise program to improve strength/mobility for better functional independence with ADLs Baseline: 6/5:  Goal status: INITIAL    LONG TERM GOALS: Target date: 09/22/2022    Patient will increase FOTO score to equal to or greater than   47  to demonstrate statistically significant improvement in mobility and quality of life.  Baseline: 6/5: 40%  Goal status: INITIAL  2. Patient (> 57 years old) will complete five times sit to stand test in < 15 seconds indicating an increased LE strength and improved balance. Baseline: 6/5: 22.71 seconds with BUE support  Goal status: INITIAL  3.  Patient will increase 10 meter walk test to >1.64m/s as to improve gait speed for better community ambulation and to reduce fall risk. Baseline: 6/5: 16.47 seconds with hurrycane Goal status: INITIAL  4.   Patient will demonstrate an improved Berg Balance Score of > 38 as to demonstrate improved balance with ADLs such as sitting/standing and transfer balance and  reduced fall risk.  Baseline: 6/5: 28/56 Goal status: INITIAL    ASSESSMENT:  CLINICAL IMPRESSION: Pt presents to skilled PT 10 minutes late, explaining she had woke up late from a nap. Pt worked hard throughout entire session and expressed motivation and joy in her progress with interventions. Obstacle course for gait training was introduced today to include stepping over and navigation around objects, and walking long ways on a half foam roller (required CGA-MinA). During balancing activities, pt demonstrated pelvic drop and would benefit from continued strengthening.  Additionally, side-side head turns make pt dizzy, and she would benefit from meeting with Temple Pacini, DPT for vestibular screening. Patient will benefit from skilled physical therapy to increase strength, stability, and mobility for improved quality of life.    OBJECTIVE IMPAIRMENTS: Abnormal gait, decreased activity tolerance, decreased balance, decreased coordination, decreased endurance, decreased mobility, difficulty walking, decreased strength, dizziness, impaired perceived functional ability, impaired flexibility, impaired vision/preception, and improper body mechanics.   ACTIVITY LIMITATIONS: carrying, lifting, bending, standing, squatting, stairs, transfers, bed mobility, dressing, reach over head, locomotion level, and caring for others  PARTICIPATION LIMITATIONS: meal prep, cleaning, laundry, driving, shopping, community activity, and yard work  PERSONAL FACTORS: anemia, breast cancer 2023, diverticulosis of colon, Fuch's corneal dystrophy of R eye, GERD, bilateral hearing loss, HTN, HLD, macular degeneration of bilateral eyes, OA, OAB, osteopenia, urge incontinence.  are also affecting patient's functional outcome.   REHAB POTENTIAL: Good  CLINICAL DECISION MAKING: Evolving/moderate complexity  EVALUATION COMPLEXITY: Moderate  PLAN:  PT FREQUENCY: 2x/week  PT DURATION: 12 weeks  PLANNED INTERVENTIONS:  Therapeutic exercises, Therapeutic activity, Neuromuscular re-education, Balance training, Gait training, Patient/Family education, Self Care, Joint mobilization, Stair training, Vestibular training, Canalith repositioning, Visual/preceptual remediation/compensation, DME instructions, Electrical stimulation, Cryotherapy, Moist heat, Manual lymph drainage, Splintting, Taping, Traction, Ultrasound, Manual therapy, and Re-evaluation  PLAN FOR NEXT SESSION:LE strengthening, power, balance, endurance  Glynn Octave, SPT  This entire session was performed under direct supervision and direction of a licensed Estate agent . I have personally read, edited and approve of the note as written.  Precious Bard PT  07/14/2022, 12:01 PM

## 2022-07-14 ENCOUNTER — Ambulatory Visit: Payer: Medicare PPO

## 2022-07-14 DIAGNOSIS — R262 Difficulty in walking, not elsewhere classified: Secondary | ICD-10-CM | POA: Diagnosis not present

## 2022-07-14 DIAGNOSIS — M6281 Muscle weakness (generalized): Secondary | ICD-10-CM | POA: Diagnosis not present

## 2022-07-14 DIAGNOSIS — R2689 Other abnormalities of gait and mobility: Secondary | ICD-10-CM | POA: Diagnosis not present

## 2022-07-14 DIAGNOSIS — Z9181 History of falling: Secondary | ICD-10-CM | POA: Diagnosis not present

## 2022-07-14 DIAGNOSIS — R2681 Unsteadiness on feet: Secondary | ICD-10-CM | POA: Diagnosis not present

## 2022-07-19 ENCOUNTER — Encounter: Payer: Self-pay | Admitting: Physical Therapy

## 2022-07-19 ENCOUNTER — Ambulatory Visit: Payer: Medicare PPO | Admitting: Physical Therapy

## 2022-07-19 DIAGNOSIS — R262 Difficulty in walking, not elsewhere classified: Secondary | ICD-10-CM

## 2022-07-19 DIAGNOSIS — M6281 Muscle weakness (generalized): Secondary | ICD-10-CM | POA: Diagnosis not present

## 2022-07-19 DIAGNOSIS — Z9181 History of falling: Secondary | ICD-10-CM | POA: Diagnosis not present

## 2022-07-19 DIAGNOSIS — R2689 Other abnormalities of gait and mobility: Secondary | ICD-10-CM | POA: Diagnosis not present

## 2022-07-19 DIAGNOSIS — R2681 Unsteadiness on feet: Secondary | ICD-10-CM

## 2022-07-19 NOTE — Therapy (Signed)
OUTPATIENT PHYSICAL THERAPY NEURO TREATMENT NOTE   Patient Name: Sierra Bentley MRN: [id number] DOB:Aug 03, 1933, 89 y.o., female Today's Date: 07/19/2022   PCP: Judy Pimple MD REFERRING PROVIDER: Judy Pimple MD   END OF SESSION:  PT End of Session - 07/19/22 1116     Visit Number 5    Number of Visits 24    Date for PT Re-Evaluation 09/22/22    Authorization Type 1/10 06/30/22    PT Start Time 1101    PT Stop Time 1143    PT Time Calculation (min) 42 min    Equipment Utilized During Treatment Gait belt    Activity Tolerance Patient tolerated treatment well    Behavior During Therapy WFL for tasks assessed/performed                 Past Medical History:  Diagnosis Date   Allergic rhinitis, cause unspecified    Anemia    Breast cancer (HCC)    left breast IDC 2023   Chronically dry eyes, bilateral    Diverticulosis of colon    Endometrial polyp    Family history of prostate cancer 03/18/2021   Fuchs' corneal dystrophy of right eye    GERD (gastroesophageal reflux disease)    Hearing loss of both ears    pt stated has hearing aids but does not wear   History of epistaxis    per pt has had several cautization's for nose bleed   History of radiation therapy    05/20/21-06/10/21- Dr. Antony Blackbird   HTN (hypertension)    Hyperlipidemia    Macular degeneration of both eyes    followed @[facility]  by dr Demetrius Charity. mettu;   both age-related and right is with active choroidal neovascularization   OA (osteoarthritis)    OAB (overactive bladder)    Osteopenia    Thickened endometrium    Urge incontinence of urine    Wears glasses    Past Surgical History:  Procedure Laterality Date   BREAST BIOPSY Left 2011   fibrocystic change   BREAST LUMPECTOMY WITH RADIOACTIVE SEED LOCALIZATION Left 04/13/2021   Procedure: LEFT BREAST LUMPECTOMY WITH RADIOACTIVE SEED LOCALIZATION;  Surgeon: Emelia Loron, MD;  Location: [facility];  Service: General;  Laterality:  Left;   CATARACT EXTRACTION W/ INTRAOCULAR LENS IMPLANT Bilateral 2008   COLONOSCOPY  2013   DILATATION & CURETTAGE/HYSTEROSCOPY WITH MYOSURE N/A 11/26/2020   Procedure: DILATATION & CURETTAGE/HYSTEROSCOPY WITH MYOSURE;  Surgeon: Genia Del, MD;  Location: [facility];  Service: Gynecology;  Laterality: N/A;   TONSILLECTOMY  1962   Patient Active Problem List   Diagnosis Date Noted   History of fall 05/20/2022   Sacral pain 05/20/2022   SOB (shortness of breath) 05/20/2022   Dysuria 05/20/2022   Grief reaction 06/14/2021   General weakness 06/12/2021   Current use of proton pump inhibitor 03/19/2021   Family history of prostate cancer 03/18/2021   Malignant neoplasm of upper-outer quadrant of left breast in female, estrogen receptor negative (HCC) 03/17/2021   Poor balance 05/07/2019   Exudative age-related macular degeneration, right eye, with active choroidal neovascularization (HCC) 09/04/2018   History of hip fracture 02/27/2018   Pedal edema 09/28/2017   Prediabetes 11/30/2016   At moderate risk for fall 09/05/2015   Cervical spondylosis without myelopathy 08/05/2015   Routine general medical examination at a health care facility 08/27/2014   Colon cancer screening 08/27/2014   Encounter for Medicare annual wellness exam 07/25/2013   Varicosities  of leg 05/23/2012   Dry eye syndrome 04/14/2012   Nonexudative senile macular degeneration of retina 04/14/2012   Fuchs' corneal dystrophy 04/14/2012   Pseudophakia 04/14/2012   Abnormal ultrasound of thyroid gland 03/01/2011   Leg cramps 05/27/2009   Hyperlipidemia 06/11/2008   Allergic rhinitis 03/29/2008   Essential hypertension, benign 12/26/2006   GERD 12/26/2006   Rheumatoid arthritis (HCC) 12/26/2006   Osteoporosis 12/26/2006   URINARY INCONTINENCE 12/26/2006    ONSET DATE: a few months per patient report   REFERRING DIAG: poor balance, history of fall, moderate risk for fall   THERAPY DIAG:   Unsteadiness on feet  Muscle weakness (generalized)  Difficulty in walking, not elsewhere classified  Rationale for Evaluation and Treatment: Rehabilitation  SUBJECTIVE:                                                                                                                                                                                             SUBJECTIVE STATEMENT:  Pt reports doing well, denies any falls/stumbles since last session, notes no other significant changes either.  Pt accompanied by: self  PERTINENT HISTORY:   Patient presents to physical therapy for balance and fall risk. Patient has been seen in the past at this clinic in 2021. PMH includes anemia, breast cancer 2023, diverticulosis of colon, Fuch's corneal dystrophy of R eye, GERD, bilateral hearing loss, HTN, HLD, macular degeneration of bilateral eyes, OA, OAB, osteopenia, urge incontinence. Patient had therapy a few months ago that was not helpful. Patient reports one fall about a month ago at home and another fall in [employer]; tripped on ramp and fell backwards.  Has dizziness with quick turns and leaning over.   PAIN:  Are you having pain?  Have headaches, affects balance None currently.  PRECAUTIONS: Fall  WEIGHT BEARING RESTRICTIONS: No  FALLS: Has patient fallen in last 6 months? Yes. Number of falls 2  LIVING ENVIRONMENT: Lives with: lives alone Lives in: House/apartment Stairs:  two levels, doesn't go upstairs, 4 stairs in garage, 2 in front, handrails can reach both Has following equipment at home: Quad cane small base and Walker - 2 wheeled  PLOF: Independent  PATIENT GOALS: to get stronger and more steady   OBJECTIVE:   COGNITION: Overall cognitive status: Within functional limits for tasks assessed   SENSATION: WFL  COORDINATION: Heel slide test: very challenging due to fatigue   POSTURE: rounded shoulders  Vitals: Seated: 113/64 Standing 110/68    LOWER EXTREMITY  MMT:    MMT Right Eval Left Eval  Hip flexion 3+ 3+  Hip extension    Hip abduction 3+  3+  Hip adduction 3+ 3+  Hip internal rotation    Hip external rotation    Knee flexion 4- 4-  Knee extension 3+ 4-  Ankle dorsiflexion 4- 4-  Ankle plantarflexion 4- 4-  Ankle inversion    Ankle eversion    (Blank rows = not tested)   TRANSFERS: Assistive device utilized: Single point cane  Sit to stand: SBA and CGA Stand to sit: SBA Chair to chair: CGA   GAIT: Gait pattern: step through pattern, decreased stride length, and narrow BOS Distance walked: 50 ft  Assistive device utilized:  hurrycane Level of assistance: CGA Comments: patient is challenged with quick steps   FUNCTIONAL TESTS:  5 times sit to stand: 22.71 10 meter walk test: 16.47 seconds with hurrycane  Berg Balance Scale: 28/56  PATIENT SURVEYS:  FOTO 40  TODAY'S TREATMENT:                                                                                                                              DATE: 07/19/22   Unless otherwise stated, CGA was provided and gait belt donned in order to ensure pt safety. Hurrycane used for mobility tasks.   TE: ( ) -Seated knee marches: 2 x 10 ea side with 2.5#AW, cues for 2 sec holds. Pt sitting upright with no UE support on second bout to increase challenge -Seated knee extensions 2 x 12 ea side with 2.5# AW, cues for 2 sec holds -Standing hamstring curls 1 x 8 ea side with 2.5# AW. -Standing hip ABD 1 x 10 ea side, cues for slow controlled motions and to keep toes pointed forward. Additional cues given throughout for core activation and upright posture.  Pt required UE support on rail for safety throughout standing therex.   TA:   STS throughout sessions w/ emphasis on eccentric control   NMR: (10 min)  -Standing on Airex pad at balance bar, 30 secs. 2 x 30 secs with horizontal head turns, pt had minor dizziness but was able to stabilize without needing to grab onto  balance bar. -Standing tandem at balance bar, 2 x 30 secs each leg in hind foot position.    Gait Training: (10 min)  -6 laps down and back 15 ft distance with SPC as listed above, stepping over 1/2 foam rolls, pt req cueing for proper cane sequencing when stepping over obstacles. She completes task with good foot clearance mostly, foot got caught only 1 x after pt reporting feeling dizzy after turning.   PATIENT EDUCATION: Education details: goals, POC Person educated: Patient Education method: Explanation, Demonstration, Tactile cues, and Verbal cues Education comprehension: verbalized understanding, returned demonstration, verbal cues required, and tactile cues required  HOME EXERCISE PROGRAM: Access Code: [email or web address] URL: [email or web address] Date: 07/14/2022 Prepared by: Precious Bard  Exercises - Seated Heel Toe Raises  - 1 x daily - 7 x weekly - 2 sets - 10 reps - 5 hold - Standing Tandem Balance with  Counter Support  - 1 x daily - 7 x weekly - 2 sets - 2 reps - 30 hold - Standing March with Counter Support  - 1 x daily - 7 x weekly - 2 sets - 10 reps - 5 hold - Ankle Inversion Eversion Towel Slide  - 1 x daily - 7 x weekly - 1 sets - 15 reps - 5 hold  GOALS: Goals reviewed with patient? Yes  SHORT TERM GOALS: Target date: 07/28/2022    Patient will be independent in home exercise program to improve strength/mobility for better functional independence with ADLs Baseline: 6/5:  Goal status: INITIAL    LONG TERM GOALS: Target date: 09/22/2022    Patient will increase FOTO score to equal to or greater than   47  to demonstrate statistically significant improvement in mobility and quality of life.  Baseline: 6/5: 40%  Goal status: INITIAL  2. Patient (> 31 years old) will complete five times sit to stand test in < 15 seconds indicating an increased LE strength and improved balance. Baseline: 6/5: 22.71 seconds with BUE support  Goal status: INITIAL  3.   Patient will increase 10 meter walk test to >1.12m/s as to improve gait speed for better community ambulation and to reduce fall risk. Baseline: 6/5: 16.47 seconds with hurrycane Goal status: INITIAL  4.   Patient will demonstrate an improved Berg Balance Score of > 38 as to demonstrate improved balance with ADLs such as sitting/standing and transfer balance and reduced fall risk.  Baseline: 6/5: 28/56 Goal status: INITIAL    ASSESSMENT:  CLINICAL IMPRESSION: Patient appeared motivated and ready for treatment on this day. Today's session focused on a continuation of overall lower extremity strengthening and balance. Patient reports dizziness and with turning in place and during horizontal head turns. Mild impulsivity noted throughout session.  SPT notes minor imbalance but pt demonstrates decent stepping strategies to recover. Introduced new isolated strengthening exercises or further benefit functional strengthening progress. Worked on stepping over 1/2 foam roll today, pt educated on appropriate cane sequencing to ensure stability when performing task. Patient will benefit from skilled physical therapy to increase strength, stability, and mobility for improved quality of life.    OBJECTIVE IMPAIRMENTS: Abnormal gait, decreased activity tolerance, decreased balance, decreased coordination, decreased endurance, decreased mobility, difficulty walking, decreased strength, dizziness, impaired perceived functional ability, impaired flexibility, impaired vision/preception, and improper body mechanics.   ACTIVITY LIMITATIONS: carrying, lifting, bending, standing, squatting, stairs, transfers, bed mobility, dressing, reach over head, locomotion level, and caring for others  PARTICIPATION LIMITATIONS: meal prep, cleaning, laundry, driving, shopping, community activity, and yard work  PERSONAL FACTORS: anemia, breast cancer 2023, diverticulosis of colon, Fuch's corneal dystrophy of R eye, GERD, bilateral  hearing loss, HTN, HLD, macular degeneration of bilateral eyes, OA, OAB, osteopenia, urge incontinence.  are also affecting patient's functional outcome.   REHAB POTENTIAL: Good  CLINICAL DECISION MAKING: Evolving/moderate complexity  EVALUATION COMPLEXITY: Moderate  PLAN:  PT FREQUENCY: 2x/week  PT DURATION: 12 weeks  PLANNED INTERVENTIONS: Therapeutic exercises, Therapeutic activity, Neuromuscular re-education, Balance training, Gait training, Patient/Family education, Self Care, Joint mobilization, Stair training, Vestibular training, Canalith repositioning, Visual/preceptual remediation/compensation, DME instructions, Electrical stimulation, Cryotherapy, Moist heat, Manual lymph drainage, Splintting, Taping, Traction, Ultrasound, Manual therapy, and Re-evaluation  PLAN FOR NEXT SESSION:LE strengthening, power, balance, endurance, possible vestibular assessment/screening   Raffi Milstein Ritch, SPT   I have read and reviewed the attached note and am in agreement with the documentation provided.     This  licensed clinician was present and actively directing care throughout the session at all times.  Golden Pop PT  07/19/2022, 12:15 PM

## 2022-07-21 ENCOUNTER — Telehealth: Payer: Self-pay

## 2022-07-21 ENCOUNTER — Ambulatory Visit: Payer: Medicare PPO

## 2022-07-21 NOTE — Telephone Encounter (Signed)
Patient called and voicemail left due to no-show at today's appointment time. Voicemail includes time and date of next appointment.   Precious Bard, PT, DPT Physical Therapist - [facility]  Outpatient Physical Therapy- Main Campus [phone]

## 2022-07-26 ENCOUNTER — Encounter: Payer: Self-pay | Admitting: Physical Therapy

## 2022-07-26 ENCOUNTER — Ambulatory Visit: Payer: Medicare PPO | Attending: Family Medicine | Admitting: Physical Therapy

## 2022-07-26 DIAGNOSIS — R262 Difficulty in walking, not elsewhere classified: Secondary | ICD-10-CM | POA: Insufficient documentation

## 2022-07-26 DIAGNOSIS — R2681 Unsteadiness on feet: Secondary | ICD-10-CM | POA: Insufficient documentation

## 2022-07-26 DIAGNOSIS — M6281 Muscle weakness (generalized): Secondary | ICD-10-CM | POA: Insufficient documentation

## 2022-07-26 NOTE — Therapy (Signed)
OUTPATIENT PHYSICAL THERAPY NEURO TREATMENT NOTE   Patient Name: Sierra Bentley MRN: [id number] DOB:1933-08-06, 89 y.o., female Today's Date: 07/26/2022   PCP: Judy Pimple MD REFERRING PROVIDER: Judy Pimple MD   END OF SESSION:  PT End of Session - 07/26/22 1301     Visit Number 6    Number of Visits 24    Date for PT Re-Evaluation 09/22/22    Authorization Type 1/10 06/30/22    PT Start Time 1303    PT Stop Time 1344    PT Time Calculation (min) 41 min    Equipment Utilized During Treatment Gait belt    Activity Tolerance Patient tolerated treatment well    Behavior During Therapy WFL for tasks assessed/performed                 Past Medical History:  Diagnosis Date   Allergic rhinitis, cause unspecified    Anemia    Breast cancer (HCC)    left breast IDC 2023   Chronically dry eyes, bilateral    Diverticulosis of colon    Endometrial polyp    Family history of prostate cancer 03/18/2021   Fuchs' corneal dystrophy of right eye    GERD (gastroesophageal reflux disease)    Hearing loss of both ears    pt stated has hearing aids but does not wear   History of epistaxis    per pt has had several cautization's for nose bleed   History of radiation therapy    05/20/21-06/10/21- Dr. Antony Blackbird   HTN (hypertension)    Hyperlipidemia    Macular degeneration of both eyes    followed @[facility]  by dr Demetrius Charity. mettu;   both age-related and right is with active choroidal neovascularization   OA (osteoarthritis)    OAB (overactive bladder)    Osteopenia    Thickened endometrium    Urge incontinence of urine    Wears glasses    Past Surgical History:  Procedure Laterality Date   BREAST BIOPSY Left 2011   fibrocystic change   BREAST LUMPECTOMY WITH RADIOACTIVE SEED LOCALIZATION Left 04/13/2021   Procedure: LEFT BREAST LUMPECTOMY WITH RADIOACTIVE SEED LOCALIZATION;  Surgeon: Emelia Loron, MD;  Location:  [facility];  Service: General;  Laterality:  Left;   CATARACT EXTRACTION W/ INTRAOCULAR LENS IMPLANT Bilateral 2008   COLONOSCOPY  2013   DILATATION & CURETTAGE/HYSTEROSCOPY WITH MYOSURE N/A 11/26/2020   Procedure: DILATATION & CURETTAGE/HYSTEROSCOPY WITH MYOSURE;  Surgeon: Genia Del, MD;  Location: [facility];  Service: Gynecology;  Laterality: N/A;   TONSILLECTOMY  1962   Patient Active Problem List   Diagnosis Date Noted   History of fall 05/20/2022   Sacral pain 05/20/2022   SOB (shortness of breath) 05/20/2022   Dysuria 05/20/2022   Grief reaction 06/14/2021   General weakness 06/12/2021   Current use of proton pump inhibitor 03/19/2021   Family history of prostate cancer 03/18/2021   Malignant neoplasm of upper-outer quadrant of left breast in female, estrogen receptor negative (HCC) 03/17/2021   Poor balance 05/07/2019   Exudative age-related macular degeneration, right eye, with active choroidal neovascularization (HCC) 09/04/2018   History of hip fracture 02/27/2018   Pedal edema 09/28/2017   Prediabetes 11/30/2016   At moderate risk for fall 09/05/2015   Cervical spondylosis without myelopathy 08/05/2015   Routine general medical examination at a health care facility 08/27/2014   Colon cancer screening 08/27/2014   Encounter for Medicare annual wellness exam 07/25/2013   Varicosities  of leg 05/23/2012   Dry eye syndrome 04/14/2012   Nonexudative senile macular degeneration of retina 04/14/2012   Fuchs' corneal dystrophy 04/14/2012   Pseudophakia 04/14/2012   Abnormal ultrasound of thyroid gland 03/01/2011   Leg cramps 05/27/2009   Hyperlipidemia 06/11/2008   Allergic rhinitis 03/29/2008   Essential hypertension, benign 12/26/2006   GERD 12/26/2006   Rheumatoid arthritis (HCC) 12/26/2006   Osteoporosis 12/26/2006   URINARY INCONTINENCE 12/26/2006    ONSET DATE: a few months per patient report   REFERRING DIAG: poor balance, history of fall, moderate risk for fall   THERAPY DIAG:   Unsteadiness on feet  Muscle weakness (generalized)  Difficulty in walking, not elsewhere classified  Rationale for Evaluation and Treatment: Rehabilitation  SUBJECTIVE:                                                                                                                                                                                             SUBJECTIVE STATEMENT:  Pt reports doing well, denies any falls/stumbles since last session, notes no other significant updates   Pt accompanied by: self  PERTINENT HISTORY:   Patient presents to physical therapy for balance and fall risk. Patient has been seen in the past at this clinic in 2021. PMH includes anemia, breast cancer 2023, diverticulosis of colon, Fuch's corneal dystrophy of R eye, GERD, bilateral hearing loss, HTN, HLD, macular degeneration of bilateral eyes, OA, OAB, osteopenia, urge incontinence. Patient had therapy a few months ago that was not helpful. Patient reports one fall about a month ago at home and another fall in [place] [employer]; tripped on ramp and fell backwards.  Has dizziness with quick turns and leaning over.   PAIN:  Are you having pain?  Have headaches, affects balance None currently.  PRECAUTIONS: Fall  WEIGHT BEARING RESTRICTIONS: No  FALLS: Has patient fallen in last 6 months? Yes. Number of falls 2  LIVING ENVIRONMENT: Lives with: lives alone Lives in: House/apartment Stairs:  two levels, doesn't go upstairs, 4 stairs in garage, 2 in front, handrails can reach both Has following equipment at home: Quad cane small base and Walker - 2 wheeled  PLOF: Independent  PATIENT GOALS: to get stronger and more steady   OBJECTIVE:   COGNITION: Overall cognitive status: Within functional limits for tasks assessed   SENSATION: WFL  COORDINATION: Heel slide test: very challenging due to fatigue   POSTURE: rounded shoulders  Vitals: Seated: 113/64 Standing 110/68    LOWER EXTREMITY MMT:     MMT Right Eval Left Eval  Hip flexion 3+ 3+  Hip extension    Hip abduction 3+  3+  Hip adduction 3+ 3+  Hip internal rotation    Hip external rotation    Knee flexion 4- 4-  Knee extension 3+ 4-  Ankle dorsiflexion 4- 4-  Ankle plantarflexion 4- 4-  Ankle inversion    Ankle eversion    (Blank rows = not tested)   TRANSFERS: Assistive device utilized: Single point cane  Sit to stand: SBA and CGA Stand to sit: SBA Chair to chair: CGA   GAIT: Gait pattern: step through pattern, decreased stride length, and narrow BOS Distance walked: 50 ft  Assistive device utilized:  hurrycane Level of assistance: CGA Comments: patient is challenged with quick steps   FUNCTIONAL TESTS:  5 times sit to stand: 22.71 10 meter walk test: 16.47 seconds with hurrycane  Berg Balance Scale: 28/56  PATIENT SURVEYS:  FOTO 40  TODAY'S TREATMENT:                                                                                                                              DATE: 07/26/22   Nustep BUE/BLE reciprocal movement and enduracne training  training   level 2 x 1 min  level 3 x 4 min  level 1 cool down x 1 min   Dynamic balance training in parallel bars.  Standing on airex pad  normal BOS 2 x 1 min;  narrow BOS 2 x 1 min   Normal BOS with head turns 2 x 1 min with 3 sec hold on target at 45 deg rotation   Tandem walking on airex beam through parallel bars x 2 with full UE support and x 4 with fingertip support A/P control on airex beam 2 x 1 min   1 LE on airex pad:  No UE support x 10 sec bil  Ball raise overhead x 15 each Ball tap with trunkal rotation on parallel bars.   Sit<>stand with CGA for anterior weight shift x 8 with cues for head/hips relationship.   Throughout session PT provided CGA for safety yo reduce fall risk and improve use of ankle stratrgy to correct AP and lateral LOB.     PATIENT EDUCATION: Education details: goals, POC Person educated:  Patient Education method: Explanation, Demonstration, Tactile cues, and Verbal cues Education comprehension: verbalized understanding, returned demonstration, verbal cues required, and tactile cues required  HOME EXERCISE PROGRAM: Access Code: [email or web address] URL: [email or web address] Date: 07/14/2022 Prepared by: Precious Bard  Exercises - Seated Heel Toe Raises  - 1 x daily - 7 x weekly - 2 sets - 10 reps - 5 hold - Standing Tandem Balance with Counter Support  - 1 x daily - 7 x weekly - 2 sets - 2 reps - 30 hold - Standing March with Counter Support  - 1 x daily - 7 x weekly - 2 sets - 10 reps - 5 hold - Ankle Inversion Eversion Towel Slide  - 1 x daily - 7 x weekly - 1 sets - 15  reps - 5 hold  GOALS: Goals reviewed with patient? Yes  SHORT TERM GOALS: Target date: 07/28/2022    Patient will be independent in home exercise program to improve strength/mobility for better functional independence with ADLs Baseline: 6/5:  Goal status: INITIAL    LONG TERM GOALS: Target date: 09/22/2022    Patient will increase FOTO score to equal to or greater than   47  to demonstrate statistically significant improvement in mobility and quality of life.  Baseline: 6/5: 40%  Goal status: INITIAL  2. Patient (> 86 years old) will complete five times sit to stand test in < 15 seconds indicating an increased LE strength and improved balance. Baseline: 6/5: 22.71 seconds with BUE support  Goal status: INITIAL  3.  Patient will increase 10 meter walk test to >1.87m/s as to improve gait speed for better community ambulation and to reduce fall risk. Baseline: 6/5: 16.47 seconds with hurrycane Goal status: INITIAL  4.   Patient will demonstrate an improved Berg Balance Score of > 38 as to demonstrate improved balance with ADLs such as sitting/standing and transfer balance and reduced fall risk.  Baseline: 6/5: 28/56 Goal status: INITIAL    ASSESSMENT:  CLINICAL IMPRESSION: Patient  appeared motivated and ready for treatment on this day. Today's session focused on a continuation dynamic balance and coordination training. Pt challenged with novel balance tasks to performed tandem and head movement on unlevel surface. Mild dizziness with lateral head turns, but improved from prior sessions  Patient will benefit from skilled physical therapy to increase strength, stability, and mobility for improved quality of life.    OBJECTIVE IMPAIRMENTS: Abnormal gait, decreased activity tolerance, decreased balance, decreased coordination, decreased endurance, decreased mobility, difficulty walking, decreased strength, dizziness, impaired perceived functional ability, impaired flexibility, impaired vision/preception, and improper body mechanics.   ACTIVITY LIMITATIONS: carrying, lifting, bending, standing, squatting, stairs, transfers, bed mobility, dressing, reach over head, locomotion level, and caring for others  PARTICIPATION LIMITATIONS: meal prep, cleaning, laundry, driving, shopping, community activity, and yard work  PERSONAL FACTORS: anemia, breast cancer 2023, diverticulosis of colon, Fuch's corneal dystrophy of R eye, GERD, bilateral hearing loss, HTN, HLD, macular degeneration of bilateral eyes, OA, OAB, osteopenia, urge incontinence.  are also affecting patient's functional outcome.   REHAB POTENTIAL: Good  CLINICAL DECISION MAKING: Evolving/moderate complexity  EVALUATION COMPLEXITY: Moderate  PLAN:  PT FREQUENCY: 2x/week  PT DURATION: 12 weeks  PLANNED INTERVENTIONS: Therapeutic exercises, Therapeutic activity, Neuromuscular re-education, Balance training, Gait training, Patient/Family education, Self Care, Joint mobilization, Stair training, Vestibular training, Canalith repositioning, Visual/preceptual remediation/compensation, DME instructions, Electrical stimulation, Cryotherapy, Moist heat, Manual lymph drainage, Splintting, Taping, Traction, Ultrasound, Manual  therapy, and Re-evaluation  PLAN FOR NEXT SESSION:LE    strengthening, power, balance, endurance, possible vestibular assessment/screening    Grier Rocher PT, DPT  Physical Therapist - [facility]  [facility]  1:48 PM 07/26/22

## 2022-07-27 NOTE — Therapy (Signed)
OUTPATIENT PHYSICAL THERAPY NEURO TREATMENT NOTE   Patient Name: Sierra Bentley MRN: [id number] DOB:[birth year], 89 y.o., female Today's Date: 07/28/2022   PCP: Judy Pimple MD REFERRING PROVIDER: Judy Pimple MD   END OF SESSION:  PT End of Session - 07/28/22 1054     Visit Number 7    Number of Visits 24    Date for PT Re-Evaluation 09/22/22    Authorization Type 7/10 06/30/22    PT Start Time 1101    PT Stop Time 1144    PT Time Calculation (min) 43 min    Equipment Utilized During Treatment Gait belt    Activity Tolerance Patient tolerated treatment well    Behavior During Therapy WFL for tasks assessed/performed                  Past Medical History:  Diagnosis Date   Allergic rhinitis, cause unspecified    Anemia    Breast cancer (HCC)    left breast IDC 2023   Chronically dry eyes, bilateral    Diverticulosis of colon    Endometrial polyp    Family history of prostate cancer 03/18/2021   Fuchs' corneal dystrophy of right eye    GERD (gastroesophageal reflux disease)    Hearing loss of both ears    pt stated has hearing aids but does not wear   History of epistaxis    per pt has had several cautization's for nose bleed   History of radiation therapy    05/20/21-06/10/21- Dr. Antony Blackbird   HTN (hypertension)    Hyperlipidemia    Macular degeneration of both eyes    followed @[facility]  by dr Demetrius Charity. mettu;   both age-related and right is with active choroidal neovascularization   OA (osteoarthritis)    OAB (overactive bladder)    Osteopenia    Thickened endometrium    Urge incontinence of urine    Wears glasses    Past Surgical History:  Procedure Laterality Date   BREAST BIOPSY Left 2011   fibrocystic change   BREAST LUMPECTOMY WITH RADIOACTIVE SEED LOCALIZATION Left 04/13/2021   Procedure: LEFT BREAST LUMPECTOMY WITH RADIOACTIVE SEED LOCALIZATION;  Surgeon: Emelia Loron, MD;  Location: [facility];  Service: General;  Laterality:  Left;   CATARACT EXTRACTION W/ INTRAOCULAR LENS IMPLANT Bilateral 2008   COLONOSCOPY  2013   DILATATION & CURETTAGE/HYSTEROSCOPY WITH MYOSURE N/A 11/26/2020   Procedure: DILATATION & CURETTAGE/HYSTEROSCOPY WITH MYOSURE;  Surgeon: Genia Del, MD;  Location: [facility];  Service: Gynecology;  Laterality: N/A;   TONSILLECTOMY  1962   Patient Active Problem List   Diagnosis Date Noted   History of fall 05/20/2022   Sacral pain 05/20/2022   SOB (shortness of breath) 05/20/2022   Dysuria 05/20/2022   Grief reaction 06/14/2021   General weakness 06/12/2021   Current use of proton pump inhibitor 03/19/2021   Family history of prostate cancer 03/18/2021   Malignant neoplasm of upper-outer quadrant of left breast in female, estrogen receptor negative (HCC) 03/17/2021   Poor balance 05/07/2019   Exudative age-related macular degeneration, right eye, with active choroidal neovascularization (HCC) 09/04/2018   History of hip fracture 02/27/2018   Pedal edema 09/28/2017   Prediabetes 11/30/2016   At moderate risk for fall 09/05/2015   Cervical spondylosis without myelopathy 08/05/2015   Routine general medical examination at a health care facility 08/27/2014   Colon cancer screening 08/27/2014   Encounter for Medicare annual wellness exam 07/25/2013  Varicosities of leg 05/23/2012   Dry eye syndrome 04/14/2012   Nonexudative senile macular degeneration of retina 04/14/2012   Fuchs' corneal dystrophy 04/14/2012   Pseudophakia 04/14/2012   Abnormal ultrasound of thyroid gland 03/01/2011   Leg cramps 05/27/2009   Hyperlipidemia 06/11/2008   Allergic rhinitis 03/29/2008   Essential hypertension, benign 12/26/2006   GERD 12/26/2006   Rheumatoid arthritis (HCC) 12/26/2006   Osteoporosis 12/26/2006   URINARY INCONTINENCE 12/26/2006    ONSET DATE: a few months per patient report   REFERRING DIAG: poor balance, history of fall, moderate risk for fall   THERAPY DIAG:   Unsteadiness on feet  Muscle weakness (generalized)  Difficulty in walking, not elsewhere classified  Rationale for Evaluation and Treatment: Rehabilitation  SUBJECTIVE:                                                                                                                                                                                             SUBJECTIVE STATEMENT:  Patient reports she is doing well, no falls since last session. Continues to have issues with her balance scuffing her feet.   Pt accompanied by: self  PERTINENT HISTORY:   Patient presents to physical therapy for balance and fall risk. Patient has been seen in the past at this clinic in 2021. PMH includes anemia, breast cancer 2023, diverticulosis of colon, Fuch's corneal dystrophy of R eye, GERD, bilateral hearing loss, HTN, HLD, macular degeneration of bilateral eyes, OA, OAB, osteopenia, urge incontinence. Patient had therapy a few months ago that was not helpful. Patient reports one fall about a month ago at home and another fall in [place] [employer]; tripped on ramp and fell backwards.  Has dizziness with quick turns and leaning over.   PAIN:  Are you having pain?  Have headaches, affects balance None currently.  PRECAUTIONS: Fall  WEIGHT BEARING RESTRICTIONS: No  FALLS: Has patient fallen in last 6 months? Yes. Number of falls 2  LIVING ENVIRONMENT: Lives with: lives alone Lives in: House/apartment Stairs:  two levels, doesn't go upstairs, 4 stairs in garage, 2 in front, handrails can reach both Has following equipment at home: Quad cane small base and Walker - 2 wheeled  PLOF: Independent  PATIENT GOALS: to get stronger and more steady   OBJECTIVE:   COGNITION: Overall cognitive status: Within functional limits for tasks assessed   SENSATION: WFL  COORDINATION: Heel slide test: very challenging due to fatigue   POSTURE: rounded shoulders  Vitals: Seated: 113/64 Standing 110/68     LOWER EXTREMITY MMT:    MMT Right Eval Left Eval  Hip flexion 3+ 3+  Hip  extension    Hip abduction 3+ 3+  Hip adduction 3+ 3+  Hip internal rotation    Hip external rotation    Knee flexion 4- 4-  Knee extension 3+ 4-  Ankle dorsiflexion 4- 4-  Ankle plantarflexion 4- 4-  Ankle inversion    Ankle eversion    (Blank rows = not tested)   TRANSFERS: Assistive device utilized: Single point cane  Sit to stand: SBA and CGA Stand to sit: SBA Chair to chair: CGA   GAIT: Gait pattern: step through pattern, decreased stride length, and narrow BOS Distance walked: 50 ft  Assistive device utilized:  hurrycane Level of assistance: CGA Comments: patient is challenged with quick steps   FUNCTIONAL TESTS:  5 times sit to stand: 22.71 10 meter walk test: 16.47 seconds with hurrycane  Berg Balance Scale: 28/56  PATIENT SURVEYS:  FOTO 40  TODAY'S TREATMENT:                                                                                                                              DATE: 07/28/22  TherEx: Sit to stands 10x airex pad in chair, holding airex pad to mimic carrying tray  Seated  LAQ with adduction 10x  Adduction ball squeeze 10 x5 seconds GTB abduction 15x GTB hamstring curl 15x each LE  GTB march with upright position 15x   Neuro Re-ed: Standing with CGA next to support surface:  Airex pad: static stand 30 seconds x 2 trials, noticeable trembling of ankles/LE's with fatigue and challenge to maintain stability Airex pad: horizontal head turns 30 seconds scanning room 10x ; cueing for arc of motion  Airex pad: vertical head turns 30 seconds, cueing for arc of motion, noticeable sway with upward gaze increasing demand on ankle righting reaction musculature Airex pad: one foot on 6" step one foot on airex pad, hold position for 30 seconds, switch legs, 2x each LE; Airex pad; 4" step airex pad 8x left and right with finger tip support   In hallway ball toss to  self 150 ft ; very challenging with multiple episodes of staggering.  Heel toe walk in hallway 150 ft; for cues for sequencing and body mechanics  PATIENT EDUCATION: Education details: goals, POC Person educated: Patient Education method: Explanation, Demonstration, Tactile cues, and Verbal cues Education comprehension: verbalized understanding, returned demonstration, verbal cues required, and tactile cues required  HOME EXERCISE PROGRAM: Access Code: [email or web address] URL: [email or web address] Date: 07/14/2022 Prepared by: Precious Bard  Exercises - Seated Heel Toe Raises  - 1 x daily - 7 x weekly - 2 sets - 10 reps - 5 hold - Standing Tandem Balance with Counter Support  - 1 x daily - 7 x weekly - 2 sets - 2 reps - 30 hold - Standing March with Counter Support  - 1 x daily - 7 x weekly - 2 sets - 10 reps - 5 hold - Ankle Inversion Eversion Towel Slide  - 1  x daily - 7 x weekly - 1 sets - 15 reps - 5 hold  GOALS: Goals reviewed with patient? Yes  SHORT TERM GOALS: Target date: 07/28/2022    Patient will be independent in home exercise program to improve strength/mobility for better functional independence with ADLs Baseline: 6/5:  Goal status: INITIAL    LONG TERM GOALS: Target date: 09/22/2022    Patient will increase FOTO score to equal to or greater than   47  to demonstrate statistically significant improvement in mobility and quality of life.  Baseline: 6/5: 40%  Goal status: INITIAL  2. Patient (> 25 years old) will complete five times sit to stand test in < 15 seconds indicating an increased LE strength and improved balance. Baseline: 6/5: 22.71 seconds with BUE support  Goal status: INITIAL  3.  Patient will increase 10 meter walk test to >1.32m/s as to improve gait speed for better community ambulation and to reduce fall risk. Baseline: 6/5: 16.47 seconds with hurrycane Goal status: INITIAL  4.   Patient will demonstrate an improved Berg Balance Score of  > 38 as to demonstrate improved balance with ADLs such as sitting/standing and transfer balance and reduced fall risk.  Baseline: 6/5: 28/56 Goal status: INITIAL    ASSESSMENT:  CLINICAL IMPRESSION: Patient is challenged with maintaining balance with dual task with walking and tossing ball. Patient is eager to progress her mobility without losing her balance. She is able to tolerate unstable surfaces statically pretty well with good ankle righting reactions however is more challenged with a dynamic motion.  Patient will benefit from skilled physical therapy to increase strength, stability, and mobility for improved quality of life.    OBJECTIVE IMPAIRMENTS: Abnormal gait, decreased activity tolerance, decreased balance, decreased coordination, decreased endurance, decreased mobility, difficulty walking, decreased strength, dizziness, impaired perceived functional ability, impaired flexibility, impaired vision/preception, and improper body mechanics.   ACTIVITY LIMITATIONS: carrying, lifting, bending, standing, squatting, stairs, transfers, bed mobility, dressing, reach over head, locomotion level, and caring for others  PARTICIPATION LIMITATIONS: meal prep, cleaning, laundry, driving, shopping, community activity, and yard work  PERSONAL FACTORS: anemia, breast cancer 2023, diverticulosis of colon, Fuch's corneal dystrophy of R eye, GERD, bilateral hearing loss, HTN, HLD, macular degeneration of bilateral eyes, OA, OAB, osteopenia, urge incontinence.  are also affecting patient's functional outcome.   REHAB POTENTIAL: Good  CLINICAL DECISION MAKING: Evolving/moderate complexity  EVALUATION COMPLEXITY: Moderate  PLAN:  PT FREQUENCY: 2x/week  PT DURATION: 12 weeks  PLANNED INTERVENTIONS: Therapeutic exercises, Therapeutic activity, Neuromuscular re-education, Balance training, Gait training, Patient/Family education, Self Care, Joint mobilization, Stair training, Vestibular training,  Canalith repositioning, Visual/preceptual remediation/compensation, DME instructions, Electrical stimulation, Cryotherapy, Moist heat, Manual lymph drainage, Splintting, Taping, Traction, Ultrasound, Manual therapy, and Re-evaluation  PLAN FOR NEXT SESSION:LE    strengthening, power, balance, endurance, possible vestibular assessment/screening    Precious Bard PT  Physical Therapist - [place]  [facility]  2:11 PM 07/28/22

## 2022-07-28 ENCOUNTER — Ambulatory Visit: Payer: Medicare PPO

## 2022-07-28 DIAGNOSIS — R2681 Unsteadiness on feet: Secondary | ICD-10-CM

## 2022-07-28 DIAGNOSIS — R262 Difficulty in walking, not elsewhere classified: Secondary | ICD-10-CM

## 2022-07-28 DIAGNOSIS — M6281 Muscle weakness (generalized): Secondary | ICD-10-CM

## 2022-08-02 DIAGNOSIS — H353231 Exudative age-related macular degeneration, bilateral, with active choroidal neovascularization: Secondary | ICD-10-CM | POA: Diagnosis not present

## 2022-08-02 NOTE — Therapy (Signed)
OUTPATIENT PHYSICAL THERAPY NEURO TREATMENT NOTE   Patient Name: Sierra Bentley MRN: [id number] DOB:November 25, 1933, 89 y.o., female Today's Date: 08/03/2022   PCP: Judy Pimple MD REFERRING PROVIDER: Judy Pimple MD   END OF SESSION:  PT End of Session - 08/03/22 1008     Visit Number 8    Number of Visits 24    Date for PT Re-Evaluation 09/22/22    Authorization Type 7/10 06/30/22    PT Start Time 1015    PT Stop Time 1059    PT Time Calculation (min) 44 min    Equipment Utilized During Treatment Gait belt    Activity Tolerance Patient tolerated treatment well    Behavior During Therapy WFL for tasks assessed/performed                   Past Medical History:  Diagnosis Date   Allergic rhinitis, cause unspecified    Anemia    Breast cancer (HCC)    left breast IDC 2023   Chronically dry eyes, bilateral    Diverticulosis of colon    Endometrial polyp    Family history of prostate cancer 03/18/2021   Fuchs' corneal dystrophy of right eye    GERD (gastroesophageal reflux disease)    Hearing loss of both ears    pt stated has hearing aids but does not wear   History of epistaxis    per pt has had several cautization's for nose bleed   History of radiation therapy    05/20/21-06/10/21- Dr. Antony Blackbird   HTN (hypertension)    Hyperlipidemia    Macular degeneration of both eyes    followed @[facility]  by dr Demetrius Charity. mettu;   both age-related and right is with active choroidal neovascularization   OA (osteoarthritis)    OAB (overactive bladder)    Osteopenia    Thickened endometrium    Urge incontinence of urine    Wears glasses    Past Surgical History:  Procedure Laterality Date   BREAST BIOPSY Left 2011   fibrocystic change   BREAST LUMPECTOMY WITH RADIOACTIVE SEED LOCALIZATION Left 04/13/2021   Procedure: LEFT BREAST LUMPECTOMY WITH RADIOACTIVE SEED LOCALIZATION;  Surgeon: Emelia Loron, MD;  Location: [facility];  Service: General;   Laterality: Left;   CATARACT EXTRACTION W/ INTRAOCULAR LENS IMPLANT Bilateral 2008   COLONOSCOPY  2013   DILATATION & CURETTAGE/HYSTEROSCOPY WITH MYOSURE N/A 11/26/2020   Procedure: DILATATION & CURETTAGE/HYSTEROSCOPY WITH MYOSURE;  Surgeon: Genia Del, MD;  Location: [facility];  Service: Gynecology;  Laterality: N/A;   TONSILLECTOMY  1962   Patient Active Problem List   Diagnosis Date Noted   History of fall 05/20/2022   Sacral pain 05/20/2022   SOB (shortness of breath) 05/20/2022   Dysuria 05/20/2022   Grief reaction 06/14/2021   General weakness 06/12/2021   Current use of proton pump inhibitor 03/19/2021   Family history of prostate cancer 03/18/2021   Malignant neoplasm of upper-outer quadrant of left breast in female, estrogen receptor negative (HCC) 03/17/2021   Poor balance 05/07/2019   Exudative age-related macular degeneration, right eye, with active choroidal neovascularization (HCC) 09/04/2018   History of hip fracture 02/27/2018   Pedal edema 09/28/2017   Prediabetes 11/30/2016   At moderate risk for fall 09/05/2015   Cervical spondylosis without myelopathy 08/05/2015   Routine general medical examination at a health care facility 08/27/2014   Colon cancer screening 08/27/2014   Encounter for Medicare annual wellness exam 07/25/2013  Varicosities of leg 05/23/2012   Dry eye syndrome 04/14/2012   Nonexudative senile macular degeneration of retina 04/14/2012   Fuchs' corneal dystrophy 04/14/2012   Pseudophakia 04/14/2012   Abnormal ultrasound of thyroid gland 03/01/2011   Leg cramps 05/27/2009   Hyperlipidemia 06/11/2008   Allergic rhinitis 03/29/2008   Essential hypertension, benign 12/26/2006   GERD 12/26/2006   Rheumatoid arthritis (HCC) 12/26/2006   Osteoporosis 12/26/2006   URINARY INCONTINENCE 12/26/2006    ONSET DATE: a few months per patient report   REFERRING DIAG: poor balance, history of fall, moderate risk for fall    THERAPY DIAG:  Unsteadiness on feet  Muscle weakness (generalized)  Difficulty in walking, not elsewhere classified  Rationale for Evaluation and Treatment: Rehabilitation  SUBJECTIVE:                                                                                                                                                                                             SUBJECTIVE STATEMENT:  Patient reports having a good weekend, was with family.   Pt accompanied by: self  PERTINENT HISTORY:   Patient presents to physical therapy for balance and fall risk. Patient has been seen in the past at this clinic in 2021. PMH includes anemia, breast cancer 2023, diverticulosis of colon, Fuch's corneal dystrophy of R eye, GERD, bilateral hearing loss, HTN, HLD, macular degeneration of bilateral eyes, OA, OAB, osteopenia, urge incontinence. Patient had therapy a few months ago that was not helpful. Patient reports one fall about a month ago at home and another fall in [employer]; tripped on ramp and fell backwards.  Has dizziness with quick turns and leaning over.   PAIN:  Are you having pain?  Have headaches, affects balance None currently.  PRECAUTIONS: Fall  WEIGHT BEARING RESTRICTIONS: No  FALLS: Has patient fallen in last 6 months? Yes. Number of falls 2  LIVING ENVIRONMENT: Lives with: lives alone Lives in: House/apartment Stairs:  two levels, doesn't go upstairs, 4 stairs in garage, 2 in front, handrails can reach both Has following equipment at home: Quad cane small base and Walker - 2 wheeled  PLOF: Independent  PATIENT GOALS: to get stronger and more steady   OBJECTIVE:   COGNITION: Overall cognitive status: Within functional limits for tasks assessed   SENSATION: WFL  COORDINATION: Heel slide test: very challenging due to fatigue   POSTURE: rounded shoulders  Vitals: Seated: 113/64 Standing 110/68    LOWER EXTREMITY MMT:    MMT Right Eval Left Eval   Hip flexion 3+ 3+  Hip extension    Hip abduction 3+ 3+  Hip adduction 3+  3+  Hip internal rotation    Hip external rotation    Knee flexion 4- 4-  Knee extension 3+ 4-  Ankle dorsiflexion 4- 4-  Ankle plantarflexion 4- 4-  Ankle inversion    Ankle eversion    (Blank rows = not tested)   TRANSFERS: Assistive device utilized: Single point cane  Sit to stand: SBA and CGA Stand to sit: SBA Chair to chair: CGA   GAIT: Gait pattern: step through pattern, decreased stride length, and narrow BOS Distance walked: 50 ft  Assistive device utilized:  hurrycane Level of assistance: CGA Comments: patient is challenged with quick steps   FUNCTIONAL TESTS:  5 times sit to stand: 22.71 10 meter walk test: 16.47 seconds with hurrycane  Berg Balance Scale: 28/56  PATIENT SURVEYS:  FOTO 40  TODAY'S TREATMENT:                                                                                                                              DATE: 08/03/22  TherEx:  Seated  GTB abduction 15x GTB hamstring curl 15x each LE  GTB march with upright position 15x  GTB row 10x; (added to HEP)  Neuro Re-ed: Standing with CGA next to support surface:  Airex pad: static stand 30 seconds x 2 trials, noticeable trembling of ankles/LE's with fatigue and challenge to maintain stability; eyes closed  Rainbow ball:  -ball toss with walking forward/backwards walking 150 ft; one episode of dizziness  -ball bounce pass with forward/backwards walking 150 ft  Walking with upside down cone and ball on it 150 ft: progressed to obstacle course:  -step over half foam roller, around hedgehogs and cones and onto/over half foam roller    Activity Description: red=right hand/foot green=left hand/foot 3 on table 3 on floor Activity Setting:  The Blaze [employer] Random setting was chosen to enhance cognitive processing and agility, providing an unpredictable environment to simulate real-world scenarios, and fostering  quick reactions and adaptability.   Number of Pods:  6 Cycles/Sets:  3 Duration (Time or Hit Count):  30 seconds   Patient Stats   Hits:   13,17,15  Seated on dynadisc: -static position 60 seconds -lateral weight shift x10 each side   PATIENT EDUCATION: Education details: goals, POC Person educated: Patient Education method: Explanation, Demonstration, Tactile cues, and Verbal cues Education comprehension: verbalized understanding, returned demonstration, verbal cues required, and tactile cues required  HOME EXERCISE PROGRAM: Access Code: [email or web address] URL: [email or web address] Date: 07/14/2022 Prepared by: Precious Bard  Exercises - Seated Heel Toe Raises  - 1 x daily - 7 x weekly - 2 sets - 10 reps - 5 hold - Standing Tandem Balance with Counter Support  - 1 x daily - 7 x weekly - 2 sets - 2 reps - 30 hold - Standing March with Counter Support  - 1 x daily - 7 x weekly - 2 sets - 10 reps - 5 hold - Ankle Inversion Eversion Towel  Slide  - 1 x daily - 7 x weekly - 1 sets - 15 reps - 5 hold  GOALS: Goals reviewed with patient? Yes  SHORT TERM GOALS: Target date: 07/28/2022    Patient will be independent in home exercise program to improve strength/mobility for better functional independence with ADLs Baseline: 6/5:  Goal status: INITIAL    LONG TERM GOALS: Target date: 09/22/2022    Patient will increase FOTO score to equal to or greater than   47  to demonstrate statistically significant improvement in mobility and quality of life.  Baseline: 6/5: 40%  Goal status: INITIAL  2. Patient (> 28 years old) will complete five times sit to stand test in < 15 seconds indicating an increased LE strength and improved balance. Baseline: 6/5: 22.71 seconds with BUE support  Goal status: INITIAL  3.  Patient will increase 10 meter walk test to >1.55m/s as to improve gait speed for better community ambulation and to reduce fall risk. Baseline: 6/5: 16.47 seconds with  hurrycane Goal status: INITIAL  4.   Patient will demonstrate an improved Berg Balance Score of > 38 as to demonstrate improved balance with ADLs such as sitting/standing and transfer balance and reduced fall risk.  Baseline: 6/5: 28/56 Goal status: INITIAL    ASSESSMENT:  CLINICAL IMPRESSION: Patient has one episode of dizziness with ball toss this session. She is able to perform dynamic ambulation tasks without LOB however indicating carryover between sessions. Patient eager to progress her functional mobility, single leg stability continues to be an area of progress at this time.   Patient will benefit from skilled physical therapy to increase strength, stability, and mobility for improved quality of life.    OBJECTIVE IMPAIRMENTS: Abnormal gait, decreased activity tolerance, decreased balance, decreased coordination, decreased endurance, decreased mobility, difficulty walking, decreased strength, dizziness, impaired perceived functional ability, impaired flexibility, impaired vision/preception, and improper body mechanics.   ACTIVITY LIMITATIONS: carrying, lifting, bending, standing, squatting, stairs, transfers, bed mobility, dressing, reach over head, locomotion level, and caring for others  PARTICIPATION LIMITATIONS: meal prep, cleaning, laundry, driving, shopping, community activity, and yard work  PERSONAL FACTORS: anemia, breast cancer 2023, diverticulosis of colon, Fuch's corneal dystrophy of R eye, GERD, bilateral hearing loss, HTN, HLD, macular degeneration of bilateral eyes, OA, OAB, osteopenia, urge incontinence.  are also affecting patient's functional outcome.   REHAB POTENTIAL: Good  CLINICAL DECISION MAKING: Evolving/moderate complexity  EVALUATION COMPLEXITY: Moderate  PLAN:  PT FREQUENCY: 2x/week  PT DURATION: 12 weeks  PLANNED INTERVENTIONS: Therapeutic exercises, Therapeutic activity, Neuromuscular re-education, Balance training, Gait training, Patient/Family  education, Self Care, Joint mobilization, Stair training, Vestibular training, Canalith repositioning, Visual/preceptual remediation/compensation, DME instructions, Electrical stimulation, Cryotherapy, Moist heat, Manual lymph drainage, Splintting, Taping, Traction, Ultrasound, Manual therapy, and Re-evaluation  PLAN FOR NEXT SESSION:LE    strengthening, power, balance, endurance, possible vestibular assessment/screening    Precious Bard PT  Physical Therapist - [facility]  [facility]  10:59 AM 08/03/22

## 2022-08-03 ENCOUNTER — Ambulatory Visit: Payer: Medicare PPO

## 2022-08-03 DIAGNOSIS — R262 Difficulty in walking, not elsewhere classified: Secondary | ICD-10-CM

## 2022-08-03 DIAGNOSIS — M6281 Muscle weakness (generalized): Secondary | ICD-10-CM

## 2022-08-03 DIAGNOSIS — R2681 Unsteadiness on feet: Secondary | ICD-10-CM

## 2022-08-04 ENCOUNTER — Ambulatory Visit: Payer: Medicare PPO

## 2022-08-04 NOTE — Therapy (Signed)
OUTPATIENT PHYSICAL THERAPY NEURO TREATMENT NOTE   Patient Name: Sierra Bentley MRN: [id number] DOB:[birth year], 89 y.o., female Today's Date: 08/05/2022   PCP: Judy Pimple MD REFERRING PROVIDER: Judy Pimple MD   END OF SESSION:  PT End of Session - 08/05/22 1100     Visit Number 9    Number of Visits 24    Date for PT Re-Evaluation 09/22/22    Authorization Type 9/10 06/30/22    PT Start Time 1015    PT Stop Time 1059    PT Time Calculation (min) 44 min    Equipment Utilized During Treatment Gait belt    Activity Tolerance Patient tolerated treatment well    Behavior During Therapy WFL for tasks assessed/performed                    Past Medical History:  Diagnosis Date   Allergic rhinitis, cause unspecified    Anemia    Breast cancer (HCC)    left breast IDC 2023   Chronically dry eyes, bilateral    Diverticulosis of colon    Endometrial polyp    Family history of prostate cancer 03/18/2021   Fuchs' corneal dystrophy of right eye    GERD (gastroesophageal reflux disease)    Hearing loss of both ears    pt stated has hearing aids but does not wear   History of epistaxis    per pt has had several cautization's for nose bleed   History of radiation therapy    05/20/21-06/10/21- Dr. Antony Blackbird   HTN (hypertension)    Hyperlipidemia    Macular degeneration of both eyes    followed @[facility]  by dr Demetrius Charity. mettu;   both age-related and right is with active choroidal neovascularization   OA (osteoarthritis)    OAB (overactive bladder)    Osteopenia    Thickened endometrium    Urge incontinence of urine    Wears glasses    Past Surgical History:  Procedure Laterality Date   BREAST BIOPSY Left 2011   fibrocystic change   BREAST LUMPECTOMY WITH RADIOACTIVE SEED LOCALIZATION Left 04/13/2021   Procedure: LEFT BREAST LUMPECTOMY WITH RADIOACTIVE SEED LOCALIZATION;  Surgeon: Emelia Loron, MD;  Location: [facility];  Service: General;   Laterality: Left;   CATARACT EXTRACTION W/ INTRAOCULAR LENS IMPLANT Bilateral 2008   COLONOSCOPY  2013   DILATATION & CURETTAGE/HYSTEROSCOPY WITH MYOSURE N/A 11/26/2020   Procedure: DILATATION & CURETTAGE/HYSTEROSCOPY WITH MYOSURE;  Surgeon: Genia Del, MD;  Location: [facility];  Service: Gynecology;  Laterality: N/A;   TONSILLECTOMY  1962   Patient Active Problem List   Diagnosis Date Noted   History of fall 05/20/2022   Sacral pain 05/20/2022   SOB (shortness of breath) 05/20/2022   Dysuria 05/20/2022   Grief reaction 06/14/2021   General weakness 06/12/2021   Current use of proton pump inhibitor 03/19/2021   Family history of prostate cancer 03/18/2021   Malignant neoplasm of upper-outer quadrant of left breast in female, estrogen receptor negative (HCC) 03/17/2021   Poor balance 05/07/2019   Exudative age-related macular degeneration, right eye, with active choroidal neovascularization (HCC) 09/04/2018   History of hip fracture 02/27/2018   Pedal edema 09/28/2017   Prediabetes 11/30/2016   At moderate risk for fall 09/05/2015   Cervical spondylosis without myelopathy 08/05/2015   Routine general medical examination at a health care facility 08/27/2014   Colon cancer screening 08/27/2014   Encounter for Medicare annual wellness exam 07/25/2013  Varicosities of leg 05/23/2012   Dry eye syndrome 04/14/2012   Nonexudative senile macular degeneration of retina 04/14/2012   Fuchs' corneal dystrophy 04/14/2012   Pseudophakia 04/14/2012   Abnormal ultrasound of thyroid gland 03/01/2011   Leg cramps 05/27/2009   Hyperlipidemia 06/11/2008   Allergic rhinitis 03/29/2008   Essential hypertension, benign 12/26/2006   GERD 12/26/2006   Rheumatoid arthritis (HCC) 12/26/2006   Osteoporosis 12/26/2006   URINARY INCONTINENCE 12/26/2006    ONSET DATE: a few months per patient report   REFERRING DIAG: poor balance, history of fall, moderate risk for fall    THERAPY DIAG:  Unsteadiness on feet  Muscle weakness (generalized)  Difficulty in walking, not elsewhere classified  Rationale for Evaluation and Treatment: Rehabilitation  SUBJECTIVE:                                                                                                                                                                                             SUBJECTIVE STATEMENT:  Patient reports her R knee started bothering her this morning.   Pt accompanied by: self  PERTINENT HISTORY:   Patient presents to physical therapy for balance and fall risk. Patient has been seen in the past at this clinic in 2021. PMH includes anemia, breast cancer 2023, diverticulosis of colon, Fuch's corneal dystrophy of R eye, GERD, bilateral hearing loss, HTN, HLD, macular degeneration of bilateral eyes, OA, OAB, osteopenia, urge incontinence. Patient had therapy a few months ago that was not helpful. Patient reports one fall about a month ago at home and another fall in [employer]; tripped on ramp and fell backwards.  Has dizziness with quick turns and leaning over.   PAIN:  Are you having pain?  Have headaches, affects balance None currently.  PRECAUTIONS: Fall  WEIGHT BEARING RESTRICTIONS: No  FALLS: Has patient fallen in last 6 months? Yes. Number of falls 2  LIVING ENVIRONMENT: Lives with: lives alone Lives in: House/apartment Stairs:  two levels, doesn't go upstairs, 4 stairs in garage, 2 in front, handrails can reach both Has following equipment at home: Quad cane small base and Walker - 2 wheeled  PLOF: Independent  PATIENT GOALS: to get stronger and more steady   OBJECTIVE:   COGNITION: Overall cognitive status: Within functional limits for tasks assessed   SENSATION: WFL  COORDINATION: Heel slide test: very challenging due to fatigue   POSTURE: rounded shoulders  Vitals: Seated: 113/64 Standing 110/68    LOWER EXTREMITY MMT:    MMT Right Eval  Left Eval  Hip flexion 3+ 3+  Hip extension    Hip abduction 3+ 3+  Hip adduction  3+ 3+  Hip internal rotation    Hip external rotation    Knee flexion 4- 4-  Knee extension 3+ 4-  Ankle dorsiflexion 4- 4-  Ankle plantarflexion 4- 4-  Ankle inversion    Ankle eversion    (Blank rows = not tested)   TRANSFERS: Assistive device utilized: Single point cane  Sit to stand: SBA and CGA Stand to sit: SBA Chair to chair: CGA   GAIT: Gait pattern: step through pattern, decreased stride length, and narrow BOS Distance walked: 50 ft  Assistive device utilized:  hurrycane Level of assistance: CGA Comments: patient is challenged with quick steps   FUNCTIONAL TESTS:  5 times sit to stand: 22.71 10 meter walk test: 16.47 seconds with hurrycane  Berg Balance Scale: 28/56  PATIENT SURVEYS:  FOTO 40  TODAY'S TREATMENT:                                                                                                                              DATE: 08/05/22  TherEx: March into RTB across // bars 15x each LE, 3 second holds TrA activation into swiss ball 10x 3 second holds Heel toe raises 15x on half foam roller  Ambulate with large arm swings in // bars x 8 lengths with I'm[proved coordination and stability with repetition.   Ambulate 160 ft with large arms and steps with good body mechanics.   Neuro Re-ed: Standing with CGA next to support surface:  Airex pad: static stand 30 seconds x 2 trials, noticeable trembling of ankles/LE's with fatigue and challenge to maintain stability Airex pad: horizontal head turns 30 seconds scanning room 10x ; cueing for arc of motion  Airex pad: vertical head turns 30 seconds, cueing for arc of motion, noticeable sway with upward gaze increasing demand on ankle righting reaction musculature Airex pad: one foot on 6" step one foot on airex pad, hold position for 30 seconds, switch legs, 2x each LE;  Airex balance beam:  -lateral step 4x length  of // bars -tandem walk 6x length of // bars with finger tip support  Speed ladder: One foot each square x 12 trials with decreased instability   PATIENT EDUCATION: Education details: goals, POC Person educated: Patient Education method: Explanation, Demonstration, Tactile cues, and Verbal cues Education comprehension: verbalized understanding, returned demonstration, verbal cues required, and tactile cues required  HOME EXERCISE PROGRAM: Access Code: [email or web address] URL: [email or web address] Date: 07/14/2022 Prepared by: Precious Bard  Exercises - Seated Heel Toe Raises  - 1 x daily - 7 x weekly - 2 sets - 10 reps - 5 hold - Standing Tandem Balance with Counter Support  - 1 x daily - 7 x weekly - 2 sets - 2 reps - 30 hold - Standing March with Counter Support  - 1 x daily - 7 x weekly - 2 sets - 10 reps - 5 hold - Ankle Inversion Eversion Towel Slide  - 1 x daily - 7  x weekly - 1 sets - 15 reps - 5 hold  GOALS: Goals reviewed with patient? Yes  SHORT TERM GOALS: Target date: 07/28/2022    Patient will be independent in home exercise program to improve strength/mobility for better functional independence with ADLs Baseline: 6/5:  Goal status: INITIAL    LONG TERM GOALS: Target date: 09/22/2022    Patient will increase FOTO score to equal to or greater than   47  to demonstrate statistically significant improvement in mobility and quality of life.  Baseline: 6/5: 40%  Goal status: INITIAL  2. Patient (> 28 years old) will complete five times sit to stand test in < 15 seconds indicating an increased LE strength and improved balance. Baseline: 6/5: 22.71 seconds with BUE support  Goal status: INITIAL  3.  Patient will increase 10 meter walk test to >1.58m/s as to improve gait speed for better community ambulation and to reduce fall risk. Baseline: 6/5: 16.47 seconds with hurrycane Goal status: INITIAL  4.   Patient will demonstrate an improved Berg Balance Score  of > 38 as to demonstrate improved balance with ADLs such as sitting/standing and transfer balance and reduced fall risk.  Baseline: 6/5: 28/56 Goal status: INITIAL    ASSESSMENT:  CLINICAL IMPRESSION: Patient is highly motivated throughout session. Progression of stability interventions tolerated well throughout session. No increase of knee pain present throughout session. Carryover of tasks to ambulation noted demonstrating intrinsic carryover.  Patient will benefit from skilled physical therapy to increase strength, stability, and mobility for improved quality of life.    OBJECTIVE IMPAIRMENTS: Abnormal gait, decreased activity tolerance, decreased balance, decreased coordination, decreased endurance, decreased mobility, difficulty walking, decreased strength, dizziness, impaired perceived functional ability, impaired flexibility, impaired vision/preception, and improper body mechanics.   ACTIVITY LIMITATIONS: carrying, lifting, bending, standing, squatting, stairs, transfers, bed mobility, dressing, reach over head, locomotion level, and caring for others  PARTICIPATION LIMITATIONS: meal prep, cleaning, laundry, driving, shopping, community activity, and yard work  PERSONAL FACTORS: anemia, breast cancer 2023, diverticulosis of colon, Fuch's corneal dystrophy of R eye, GERD, bilateral hearing loss, HTN, HLD, macular degeneration of bilateral eyes, OA, OAB, osteopenia, urge incontinence.  are also affecting patient's functional outcome.   REHAB POTENTIAL: Good  CLINICAL DECISION MAKING: Evolving/moderate complexity  EVALUATION COMPLEXITY: Moderate  PLAN:  PT FREQUENCY: 2x/week  PT DURATION: 12 weeks  PLANNED INTERVENTIONS: Therapeutic exercises, Therapeutic activity, Neuromuscular re-education, Balance training, Gait training, Patient/Family education, Self Care, Joint mobilization, Stair training, Vestibular training, Canalith repositioning, Visual/preceptual  remediation/compensation, DME instructions, Electrical stimulation, Cryotherapy, Moist heat, Manual lymph drainage, Splintting, Taping, Traction, Ultrasound, Manual therapy, and Re-evaluation  PLAN FOR NEXT SESSION:LE    Progress note  Precious Bard PT  Physical Therapist - [facility]  [facility]  11:07 AM 08/05/22

## 2022-08-05 ENCOUNTER — Ambulatory Visit: Payer: Medicare PPO

## 2022-08-05 ENCOUNTER — Telehealth: Payer: Self-pay | Admitting: Family Medicine

## 2022-08-05 DIAGNOSIS — R262 Difficulty in walking, not elsewhere classified: Secondary | ICD-10-CM | POA: Diagnosis not present

## 2022-08-05 DIAGNOSIS — R2681 Unsteadiness on feet: Secondary | ICD-10-CM

## 2022-08-05 DIAGNOSIS — M6281 Muscle weakness (generalized): Secondary | ICD-10-CM | POA: Diagnosis not present

## 2022-08-05 NOTE — Telephone Encounter (Signed)
Patient dropped off document Handicap Placard, to be filled out by provider. Patient requested to send it back via Mail within ASAP. Document is located in providers tray at front office.Please advise at Mobile [phone] (mobile) Patient asked for this to be mailed to her once completed at her address. Patient also wanted to make Dr. Milinda Antis aware she has had her shingrix vaccination on 7/1 at [facility]

## 2022-08-09 NOTE — Telephone Encounter (Signed)
Done and in IN box 

## 2022-08-09 NOTE — Telephone Encounter (Signed)
In your inbox.

## 2022-08-10 ENCOUNTER — Ambulatory Visit: Payer: Medicare PPO

## 2022-08-10 DIAGNOSIS — R2681 Unsteadiness on feet: Secondary | ICD-10-CM

## 2022-08-10 DIAGNOSIS — R262 Difficulty in walking, not elsewhere classified: Secondary | ICD-10-CM

## 2022-08-10 DIAGNOSIS — M6281 Muscle weakness (generalized): Secondary | ICD-10-CM

## 2022-08-10 NOTE — Therapy (Signed)
OUTPATIENT PHYSICAL THERAPY NEURO TREATMENT NOTE/ Physical Therapy Progress Note   Dates of reporting period  06/30/22   to   08/10/22    Patient Name: Sierra Bentley MRN: [id number] DOB:[birth year], 89 y.o., female Today's Date: 08/10/2022   PCP: Judy Pimple MD REFERRING PROVIDER: Judy Pimple MD   END OF SESSION:  PT End of Session - 08/10/22 1450     Visit Number 10    Number of Visits 24    Date for PT Re-Evaluation 09/22/22    Authorization Type 10/10 06/30/22    PT Start Time 0250    PT Stop Time 0329    PT Time Calculation (min) 39 min    Equipment Utilized During Treatment Gait belt    Activity Tolerance Patient tolerated treatment well    Behavior During Therapy WFL for tasks assessed/performed                     Past Medical History:  Diagnosis Date   Allergic rhinitis, cause unspecified    Anemia    Breast cancer (HCC)    left breast IDC 2023   Chronically dry eyes, bilateral    Diverticulosis of colon    Endometrial polyp    Family history of prostate cancer 03/18/2021   Fuchs' corneal dystrophy of right eye    GERD (gastroesophageal reflux disease)    Hearing loss of both ears    pt stated has hearing aids but does not wear   History of epistaxis    per pt has had several cautization's for nose bleed   History of radiation therapy    05/20/21-06/10/21- Dr. Antony Blackbird   HTN (hypertension)    Hyperlipidemia    Macular degeneration of both eyes    followed @[facility]  by dr Demetrius Charity. mettu;   both age-related and right is with active choroidal neovascularization   OA (osteoarthritis)    OAB (overactive bladder)    Osteopenia    Thickened endometrium    Urge incontinence of urine    Wears glasses    Past Surgical History:  Procedure Laterality Date   BREAST BIOPSY Left 2011   fibrocystic change   BREAST LUMPECTOMY WITH RADIOACTIVE SEED LOCALIZATION Left 04/13/2021   Procedure: LEFT BREAST LUMPECTOMY WITH RADIOACTIVE SEED LOCALIZATION;   Surgeon: Emelia Loron, MD;  Location: [facility];  Service: General;  Laterality: Left;   CATARACT EXTRACTION W/ INTRAOCULAR LENS IMPLANT Bilateral 2008   COLONOSCOPY  2013   DILATATION & CURETTAGE/HYSTEROSCOPY WITH MYOSURE N/A 11/26/2020   Procedure: DILATATION & CURETTAGE/HYSTEROSCOPY WITH MYOSURE;  Surgeon: Genia Del, MD;  Location: [facility];  Service: Gynecology;  Laterality: N/A;   TONSILLECTOMY  1962   Patient Active Problem List   Diagnosis Date Noted   History of fall 05/20/2022   Sacral pain 05/20/2022   SOB (shortness of breath) 05/20/2022   Dysuria 05/20/2022   Grief reaction 06/14/2021   General weakness 06/12/2021   Current use of proton pump inhibitor 03/19/2021   Family history of prostate cancer 03/18/2021   Malignant neoplasm of upper-outer quadrant of left breast in female, estrogen receptor negative (HCC) 03/17/2021   Poor balance 05/07/2019   Exudative age-related macular degeneration, right eye, with active choroidal neovascularization (HCC) 09/04/2018   History of hip fracture 02/27/2018   Pedal edema 09/28/2017   Prediabetes 11/30/2016   At moderate risk for fall 09/05/2015   Cervical spondylosis without myelopathy 08/05/2015   Routine general medical examination at  a health care facility 08/27/2014   Colon cancer screening 08/27/2014   Encounter for Medicare annual wellness exam 07/25/2013   Varicosities of leg 05/23/2012   Dry eye syndrome 04/14/2012   Nonexudative senile macular degeneration of retina 04/14/2012   Fuchs' corneal dystrophy 04/14/2012   Pseudophakia 04/14/2012   Abnormal ultrasound of thyroid gland 03/01/2011   Leg cramps 05/27/2009   Hyperlipidemia 06/11/2008   Allergic rhinitis 03/29/2008   Essential hypertension, benign 12/26/2006   GERD 12/26/2006   Rheumatoid arthritis (HCC) 12/26/2006   Osteoporosis 12/26/2006   URINARY INCONTINENCE 12/26/2006    ONSET DATE: a few months per  patient report   REFERRING DIAG: poor balance, history of fall, moderate risk for fall   THERAPY DIAG:  Unsteadiness on feet  Muscle weakness (generalized)  Difficulty in walking, not elsewhere classified  Rationale for Evaluation and Treatment: Rehabilitation  SUBJECTIVE:                                                                                                                                                                                             SUBJECTIVE STATEMENT:  Patient reports she is getting a ball for strengthening. Patient is moving to assisted living facility.   Pt accompanied by: self  PERTINENT HISTORY:   Patient presents to physical therapy for balance and fall risk. Patient has been seen in the past at this clinic in 2021. PMH includes anemia, breast cancer 2023, diverticulosis of colon, Fuch's corneal dystrophy of R eye, GERD, bilateral hearing loss, HTN, HLD, macular degeneration of bilateral eyes, OA, OAB, osteopenia, urge incontinence. Patient had therapy a few months ago that was not helpful. Patient reports one fall about a month ago at home and another fall in [employer]; tripped on ramp and fell backwards.  Has dizziness with quick turns and leaning over.   PAIN:  Are you having pain?  Have headaches, affects balance None currently.  PRECAUTIONS: Fall  WEIGHT BEARING RESTRICTIONS: No  FALLS: Has patient fallen in last 6 months? Yes. Number of falls 2  LIVING ENVIRONMENT: Lives with: lives alone Lives in: House/apartment Stairs:  two levels, doesn't go upstairs, 4 stairs in garage, 2 in front, handrails can reach both Has following equipment at home: Quad cane small base and Walker - 2 wheeled  PLOF: Independent  PATIENT GOALS: to get stronger and more steady   OBJECTIVE:   COGNITION: Overall cognitive status: Within functional limits for tasks assessed   SENSATION: WFL  COORDINATION: Heel slide test: very challenging due to  fatigue   POSTURE: rounded shoulders  Vitals: Seated: 113/64 Standing 110/68    LOWER  EXTREMITY MMT:    MMT Right Eval Left Eval  Hip flexion 3+ 3+  Hip extension    Hip abduction 3+ 3+  Hip adduction 3+ 3+  Hip internal rotation    Hip external rotation    Knee flexion 4- 4-  Knee extension 3+ 4-  Ankle dorsiflexion 4- 4-  Ankle plantarflexion 4- 4-  Ankle inversion    Ankle eversion    (Blank rows = not tested)   TRANSFERS: Assistive device utilized: Single point cane  Sit to stand: SBA and CGA Stand to sit: SBA Chair to chair: CGA   GAIT: Gait pattern: step through pattern, decreased stride length, and narrow BOS Distance walked: 50 ft  Assistive device utilized:  hurrycane Level of assistance: CGA Comments: patient is challenged with quick steps   FUNCTIONAL TESTS:  5 times sit to stand: 22.71 10 meter walk test: 16.47 seconds with hurrycane  Berg Balance Scale: 28/56  PATIENT SURVEYS:  FOTO 40  TODAY'S TREATMENT:                                                                                                                              DATE: 08/10/22    Goals performed see below   TherEx: Review swiss ball HEP Exercises - Dead Bug with Swiss Ball 10x - Seated Abdominal Press into [employer] 10x -seated abdominal press with UE raises 10x - Supine Hamstring Curl on Swiss Ball 10x    PATIENT EDUCATION: Education details: goals, POC Person educated: Patient Education method: Explanation, Demonstration, Tactile cues, and Verbal cues Education comprehension: verbalized understanding, returned demonstration, verbal cues required, and tactile cues required  HOME EXERCISE PROGRAM: Access Code: [email or web address] URL: [email or web address] Date: 07/14/2022 Prepared by: Precious Bard  Exercises - Seated Heel Toe Raises  - 1 x daily - 7 x weekly - 2 sets - 10 reps - 5 hold - Standing Tandem Balance with Counter Support  - 1 x daily - 7 x  weekly - 2 sets - 2 reps - 30 hold - Standing March with Counter Support  - 1 x daily - 7 x weekly - 2 sets - 10 reps - 5 hold - Ankle Inversion Eversion Towel Slide  - 1 x daily - 7 x weekly - 1 sets - 15 reps - 5 hold  Access Code: [email or web address] URL: [email or web address] Date: 08/10/2022 Prepared by: Precious Bard  Exercises - Dead Bug with Swiss Ball  - 1 x daily - 7 x weekly - 2 sets - 10 reps - 5 hold - Seated Abdominal Press into Swiss Ball  - 1 x daily - 7 x weekly - 2 sets - 10 reps - 5 hold - Supine Hamstring Curl on Swiss Ball  - 1 x daily - 7 x weekly - 2 sets - 10 reps - 5 hold  GOALS: Goals reviewed with patient? Yes  SHORT TERM GOALS: Target date: 07/28/2022    Patient will be independent in  home exercise program to improve strength/mobility for better functional independence with ADLs Baseline: 6/5: 7/16: intermittent compliance  Goal status: Partially Met    LONG TERM GOALS: Target date: 09/22/2022    Patient will increase FOTO score to equal to or greater than   47  to demonstrate statistically significant improvement in mobility and quality of life.  Baseline: 6/5: 40% 7/16: 56%  Goal status:MET  2. Patient (> 43 years old) will complete five times sit to stand test in < 15 seconds indicating an increased LE strength and improved balance. Baseline: 6/5: 22.71 seconds with BUE support 7/16: 21 with no hands Goal status: Partially Met   3.  Patient will increase 10 meter walk test to >1.36m/s as to improve gait speed for better community ambulation and to reduce fall risk. Baseline: 6/5: 16.47 seconds with hurrycane 7/16: 12 seconds no AD  Goal status: Partially Met   4.   Patient will demonstrate an improved Berg Balance Score of > 38 as to demonstrate improved balance with ADLs such as sitting/standing and transfer balance and reduced fall risk.  Baseline: 6/5: 28/56 7/16: 39/56  Goal status: MET    ASSESSMENT:  CLINICAL IMPRESSION:  Patient  is able to transfer without UE support now. Her balance is significantly improved as can be seen in BERG goal score; meeting that goal. Her gait speed is also improving without use of an AD this session. Patient's condition has the potential to improve in response to therapy. Maximum improvement is yet to be obtained. The anticipated improvement is attainable and reasonable in a generally predictable time.    Patient will benefit from skilled physical therapy to increase strength, stability, and mobility for improved quality of life.    OBJECTIVE IMPAIRMENTS: Abnormal gait, decreased activity tolerance, decreased balance, decreased coordination, decreased endurance, decreased mobility, difficulty walking, decreased strength, dizziness, impaired perceived functional ability, impaired flexibility, impaired vision/preception, and improper body mechanics.   ACTIVITY LIMITATIONS: carrying, lifting, bending, standing, squatting, stairs, transfers, bed mobility, dressing, reach over head, locomotion level, and caring for others  PARTICIPATION LIMITATIONS: meal prep, cleaning, laundry, driving, shopping, community activity, and yard work  PERSONAL FACTORS: anemia, breast cancer 2023, diverticulosis of colon, Fuch's corneal dystrophy of R eye, GERD, bilateral hearing loss, HTN, HLD, macular degeneration of bilateral eyes, OA, OAB, osteopenia, urge incontinence.  are also affecting patient's functional outcome.   REHAB POTENTIAL: Good  CLINICAL DECISION MAKING: Evolving/moderate complexity  EVALUATION COMPLEXITY: Moderate  PLAN:  PT FREQUENCY: 2x/week  PT DURATION: 12 weeks  PLANNED INTERVENTIONS: Therapeutic exercises, Therapeutic activity, Neuromuscular re-education, Balance training, Gait training, Patient/Family education, Self Care, Joint mobilization, Stair training, Vestibular training, Canalith repositioning, Visual/preceptual remediation/compensation, DME instructions, Electrical stimulation,  Cryotherapy, Moist heat, Manual lymph drainage, Splintting, Taping, Traction, Ultrasound, Manual therapy, and Re-evaluation  PLAN FOR NEXT SESSION:LE  Balance core strength.   Precious Bard PT  Physical Therapist - [place]  [facility]  3:45 PM 08/10/22

## 2022-08-10 NOTE — Telephone Encounter (Signed)
Left VM letting pt know form mailed

## 2022-08-11 ENCOUNTER — Ambulatory Visit: Payer: Medicare PPO

## 2022-08-12 ENCOUNTER — Ambulatory Visit: Payer: Medicare PPO

## 2022-08-12 DIAGNOSIS — R262 Difficulty in walking, not elsewhere classified: Secondary | ICD-10-CM | POA: Diagnosis not present

## 2022-08-12 DIAGNOSIS — M6281 Muscle weakness (generalized): Secondary | ICD-10-CM | POA: Diagnosis not present

## 2022-08-12 DIAGNOSIS — R2681 Unsteadiness on feet: Secondary | ICD-10-CM

## 2022-08-12 NOTE — Therapy (Signed)
OUTPATIENT PHYSICAL THERAPY NEURO TREATMENT NOTE   Patient Name: Sierra Bentley MRN: [id number] DOB:06/17/1933, 89 y.o., female Today's Date: 08/12/2022   PCP: Judy Pimple MD REFERRING PROVIDER: Judy Pimple MD   END OF SESSION:  PT End of Session - 08/12/22 0925     Visit Number 11    Number of Visits 24    Date for PT Re-Evaluation 09/22/22    Authorization Type 1/10    PT Start Time 0930    PT Stop Time 1014    PT Time Calculation (min) 44 min    Equipment Utilized During Treatment Gait belt    Activity Tolerance Patient tolerated treatment well    Behavior During Therapy WFL for tasks assessed/performed                      Past Medical History:  Diagnosis Date   Allergic rhinitis, cause unspecified    Anemia    Breast cancer (HCC)    left breast IDC 2023   Chronically dry eyes, bilateral    Diverticulosis of colon    Endometrial polyp    Family history of prostate cancer 03/18/2021   Fuchs' corneal dystrophy of right eye    GERD (gastroesophageal reflux disease)    Hearing loss of both ears    pt stated has hearing aids but does not wear   History of epistaxis    per pt has had several cautization's for nose bleed   History of radiation therapy    05/20/21-06/10/21- Dr. Antony Blackbird   HTN (hypertension)    Hyperlipidemia    Macular degeneration of both eyes    followed @[facility]  by dr Demetrius Charity. mettu;   both age-related and right is with active choroidal neovascularization   OA (osteoarthritis)    OAB (overactive bladder)    Osteopenia    Thickened endometrium    Urge incontinence of urine    Wears glasses    Past Surgical History:  Procedure Laterality Date   BREAST BIOPSY Left 2011   fibrocystic change   BREAST LUMPECTOMY WITH RADIOACTIVE SEED LOCALIZATION Left 04/13/2021   Procedure: LEFT BREAST LUMPECTOMY WITH RADIOACTIVE SEED LOCALIZATION;  Surgeon: Emelia Loron, MD;  Location: [facility];  Service: General;   Laterality: Left;   CATARACT EXTRACTION W/ INTRAOCULAR LENS IMPLANT Bilateral 2008   COLONOSCOPY  2013   DILATATION & CURETTAGE/HYSTEROSCOPY WITH MYOSURE N/A 11/26/2020   Procedure: DILATATION & CURETTAGE/HYSTEROSCOPY WITH MYOSURE;  Surgeon: Genia Del, MD;  Location: [facility];  Service: Gynecology;  Laterality: N/A;   TONSILLECTOMY  1962   Patient Active Problem List   Diagnosis Date Noted   History of fall 05/20/2022   Sacral pain 05/20/2022   SOB (shortness of breath) 05/20/2022   Dysuria 05/20/2022   Grief reaction 06/14/2021   General weakness 06/12/2021   Current use of proton pump inhibitor 03/19/2021   Family history of prostate cancer 03/18/2021   Malignant neoplasm of upper-outer quadrant of left breast in female, estrogen receptor negative (HCC) 03/17/2021   Poor balance 05/07/2019   Exudative age-related macular degeneration, right eye, with active choroidal neovascularization (HCC) 09/04/2018   History of hip fracture 02/27/2018   Pedal edema 09/28/2017   Prediabetes 11/30/2016   At moderate risk for fall 09/05/2015   Cervical spondylosis without myelopathy 08/05/2015   Routine general medical examination at a health care facility 08/27/2014   Colon cancer screening 08/27/2014   Encounter for Medicare annual wellness exam  07/25/2013   Varicosities of leg 05/23/2012   Dry eye syndrome 04/14/2012   Nonexudative senile macular degeneration of retina 04/14/2012   Fuchs' corneal dystrophy 04/14/2012   Pseudophakia 04/14/2012   Abnormal ultrasound of thyroid gland 03/01/2011   Leg cramps 05/27/2009   Hyperlipidemia 06/11/2008   Allergic rhinitis 03/29/2008   Essential hypertension, benign 12/26/2006   GERD 12/26/2006   Rheumatoid arthritis (HCC) 12/26/2006   Osteoporosis 12/26/2006   URINARY INCONTINENCE 12/26/2006    ONSET DATE: a few months per patient report   REFERRING DIAG: poor balance, history of fall, moderate risk for fall    THERAPY DIAG:  Unsteadiness on feet  Difficulty in walking, not elsewhere classified  Muscle weakness (generalized)  Rationale for Evaluation and Treatment: Rehabilitation  SUBJECTIVE:                                                                                                                                                                                             SUBJECTIVE STATEMENT:  Patient mixed up her times and came at the wrong time. Is moving to [place].   Pt accompanied by: self  PERTINENT HISTORY:   Patient presents to physical therapy for balance and fall risk. Patient has been seen in the past at this clinic in 2021. PMH includes anemia, breast cancer 2023, diverticulosis of colon, Fuch's corneal dystrophy of R eye, GERD, bilateral hearing loss, HTN, HLD, macular degeneration of bilateral eyes, OA, OAB, osteopenia, urge incontinence. Patient had therapy a few months ago that was not helpful. Patient reports one fall about a month ago at home and another fall in [employer] [employer]; tripped on ramp and fell backwards.  Has dizziness with quick turns and leaning over.   PAIN:  Are you having pain?  Have headaches, affects balance None currently.  PRECAUTIONS: Fall  WEIGHT BEARING RESTRICTIONS: No  FALLS: Has patient fallen in last 6 months? Yes. Number of falls 2  LIVING ENVIRONMENT: Lives with: lives alone Lives in: House/apartment Stairs:  two levels, doesn't go upstairs, 4 stairs in garage, 2 in front, handrails can reach both Has following equipment at home: Quad cane small base and Walker - 2 wheeled  PLOF: Independent  PATIENT GOALS: to get stronger and more steady   OBJECTIVE:   COGNITION: Overall cognitive status: Within functional limits for tasks assessed   SENSATION: WFL  COORDINATION: Heel slide test: very challenging due to fatigue   POSTURE: rounded shoulders  Vitals: Seated: 113/64 Standing 110/68    LOWER EXTREMITY MMT:    MMT  Right Eval Left Eval  Hip flexion 3+ 3+  Hip extension  Hip abduction 3+ 3+  Hip adduction 3+ 3+  Hip internal rotation    Hip external rotation    Knee flexion 4- 4-  Knee extension 3+ 4-  Ankle dorsiflexion 4- 4-  Ankle plantarflexion 4- 4-  Ankle inversion    Ankle eversion    (Blank rows = not tested)   TRANSFERS: Assistive device utilized: Single point cane  Sit to stand: SBA and CGA Stand to sit: SBA Chair to chair: CGA   GAIT: Gait pattern: step through pattern, decreased stride length, and narrow BOS Distance walked: 50 ft  Assistive device utilized:  hurrycane Level of assistance: CGA Comments: patient is challenged with quick steps   FUNCTIONAL TESTS:  5 times sit to stand: 22.71 10 meter walk test: 16.47 seconds with hurrycane  Berg Balance Scale: 28/56  PATIENT SURVEYS:  FOTO 40  TODAY'S TREATMENT:                                                                                                                              DATE: 08/12/22   TherEx: 3lb ankle weights: -marching with decreased UE support 6x length of // bars -lateral stepping 6x length of // bars; no UE support.  -heel raises 15x  Seated 3lb ankle weights: -LAQ 10x each side, cue for slow control -march with upright posture 10x each side; x 2 sets  -lateral step out 10x each side   TrA activation into swiss ball 10x 3 second holds    Neuro Re-ed: Airex balance beam:  -lateral step 6x length of // bars without UE support  -tandem walk 6x length of // bars with finger tip support -PVC pipe chest press 10x, overhead press 10x      PATIENT EDUCATION: Education details: goals, POC Person educated: Patient Education method: Explanation, Demonstration, Tactile cues, and Verbal cues Education comprehension: verbalized understanding, returned demonstration, verbal cues required, and tactile cues required  HOME EXERCISE PROGRAM: Access Code: [email or web address] URL:  [email or web address] Date: 07/14/2022 Prepared by: Precious Bard  Exercises - Seated Heel Toe Raises  - 1 x daily - 7 x weekly - 2 sets - 10 reps - 5 hold - Standing Tandem Balance with Counter Support  - 1 x daily - 7 x weekly - 2 sets - 2 reps - 30 hold - Standing March with Counter Support  - 1 x daily - 7 x weekly - 2 sets - 10 reps - 5 hold - Ankle Inversion Eversion Towel Slide  - 1 x daily - 7 x weekly - 1 sets - 15 reps - 5 hold  Access Code: [email or web address] URL: [email or web address] Date: 08/10/2022 Prepared by: Precious Bard  Exercises - Dead Bug with Swiss Ball  - 1 x daily - 7 x weekly - 2 sets - 10 reps - 5 hold - Seated Abdominal Press into Swiss Ball  - 1 x daily - 7 x weekly - 2 sets - 10 reps - 5 hold -  Supine Hamstring Curl on Swiss Ball  - 1 x daily - 7 x weekly - 2 sets - 10 reps - 5 hold  GOALS: Goals reviewed with patient? Yes  SHORT TERM GOALS: Target date: 07/28/2022    Patient will be independent in home exercise program to improve strength/mobility for better functional independence with ADLs Baseline: 6/5: 7/16: intermittent compliance  Goal status: Partially Met    LONG TERM GOALS: Target date: 09/22/2022    Patient will increase FOTO score to equal to or greater than   47  to demonstrate statistically significant improvement in mobility and quality of life.  Baseline: 6/5: 40% 7/16: 56%  Goal status:MET  2. Patient (> 24 years old) will complete five times sit to stand test in < 15 seconds indicating an increased LE strength and improved balance. Baseline: 6/5: 22.71 seconds with BUE support 7/16: 21 with no hands Goal status: Partially Met   3.  Patient will increase 10 meter walk test to >1.56m/s as to improve gait speed for better community ambulation and to reduce fall risk. Baseline: 6/5: 16.47 seconds with hurrycane 7/16: 12 seconds no AD  Goal status: Partially Met   4.   Patient will demonstrate an improved Berg  Balance Score of > 38 as to demonstrate improved balance with ADLs such as sitting/standing and transfer balance and reduced fall risk.  Baseline: 6/5: 28/56 7/16: 39/56  Goal status: MET    ASSESSMENT:  CLINICAL IMPRESSION:   Patient arrived at wrong time but was able to be seen due to cancellation. Patient is highly motivated throughout session. Introduction of ankle weights tolerated well. She has multiple near LOB with lateral stepping in // bars with ankle weights. Cueing for core activation when losing balance posterior will continue to be required as patient needs multimodal cueing.  Patient will benefit from skilled physical therapy to increase strength, stability, and mobility for improved quality of life.    OBJECTIVE IMPAIRMENTS: Abnormal gait, decreased activity tolerance, decreased balance, decreased coordination, decreased endurance, decreased mobility, difficulty walking, decreased strength, dizziness, impaired perceived functional ability, impaired flexibility, impaired vision/preception, and improper body mechanics.   ACTIVITY LIMITATIONS: carrying, lifting, bending, standing, squatting, stairs, transfers, bed mobility, dressing, reach over head, locomotion level, and caring for others  PARTICIPATION LIMITATIONS: meal prep, cleaning, laundry, driving, shopping, community activity, and yard work  PERSONAL FACTORS: anemia, breast cancer 2023, diverticulosis of colon, Fuch's corneal dystrophy of R eye, GERD, bilateral hearing loss, HTN, HLD, macular degeneration of bilateral eyes, OA, OAB, osteopenia, urge incontinence.  are also affecting patient's functional outcome.   REHAB POTENTIAL: Good  CLINICAL DECISION MAKING: Evolving/moderate complexity  EVALUATION COMPLEXITY: Moderate  PLAN:  PT FREQUENCY: 2x/week  PT DURATION: 12 weeks  PLANNED INTERVENTIONS: Therapeutic exercises, Therapeutic activity, Neuromuscular re-education, Balance training, Gait training,  Patient/Family education, Self Care, Joint mobilization, Stair training, Vestibular training, Canalith repositioning, Visual/preceptual remediation/compensation, DME instructions, Electrical stimulation, Cryotherapy, Moist heat, Manual lymph drainage, Splintting, Taping, Traction, Ultrasound, Manual therapy, and Re-evaluation  PLAN FOR NEXT SESSION:LE  Balance core strength.   Precious Bard PT  Physical Therapist - [place]  [facility]  10:23 AM 08/12/22

## 2022-08-17 ENCOUNTER — Ambulatory Visit: Payer: Medicare PPO

## 2022-08-18 ENCOUNTER — Ambulatory Visit: Payer: Medicare PPO

## 2022-08-18 NOTE — Therapy (Signed)
OUTPATIENT PHYSICAL THERAPY NEURO TREATMENT NOTE   Patient Name: Sierra Bentley MRN: [id number] DOB:01/07/1934, 89 y.o., female Today's Date: 08/19/2022   PCP: Judy Pimple MD REFERRING PROVIDER: Judy Pimple MD   END OF SESSION:  PT End of Session - 08/19/22 1537     Visit Number 12    Number of Visits 24    Date for PT Re-Evaluation 09/22/22    Authorization Type 2/10    PT Start Time 1535    PT Stop Time 1614    PT Time Calculation (min) 39 min    Equipment Utilized During Treatment Gait belt    Activity Tolerance Patient tolerated treatment well    Behavior During Therapy WFL for tasks assessed/performed                       Past Medical History:  Diagnosis Date   Allergic rhinitis, cause unspecified    Anemia    Breast cancer (HCC)    left breast IDC 2023   Chronically dry eyes, bilateral    Diverticulosis of colon    Endometrial polyp    Family history of prostate cancer 03/18/2021   Fuchs' corneal dystrophy of right eye    GERD (gastroesophageal reflux disease)    Hearing loss of both ears    pt stated has hearing aids but does not wear   History of epistaxis    per pt has had several cautization's for nose bleed   History of radiation therapy    05/20/21-06/10/21- Dr. Antony Blackbird   HTN (hypertension)    Hyperlipidemia    Macular degeneration of both eyes    followed @[facility]  by dr Demetrius Charity. mettu;   both age-related and right is with active choroidal neovascularization   OA (osteoarthritis)    OAB (overactive bladder)    Osteopenia    Thickened endometrium    Urge incontinence of urine    Wears glasses    Past Surgical History:  Procedure Laterality Date   BREAST BIOPSY Left 2011   fibrocystic change   BREAST LUMPECTOMY WITH RADIOACTIVE SEED LOCALIZATION Left 04/13/2021   Procedure: LEFT BREAST LUMPECTOMY WITH RADIOACTIVE SEED LOCALIZATION;  Surgeon: Emelia Loron, MD;  Location: [facility];  Service: General;   Laterality: Left;   CATARACT EXTRACTION W/ INTRAOCULAR LENS IMPLANT Bilateral 2008   COLONOSCOPY  2013   DILATATION & CURETTAGE/HYSTEROSCOPY WITH MYOSURE N/A 11/26/2020   Procedure: DILATATION & CURETTAGE/HYSTEROSCOPY WITH MYOSURE;  Surgeon: Genia Del, MD;  Location: [facility];  Service: Gynecology;  Laterality: N/A;   TONSILLECTOMY  1962   Patient Active Problem List   Diagnosis Date Noted   History of fall 05/20/2022   Sacral pain 05/20/2022   SOB (shortness of breath) 05/20/2022   Dysuria 05/20/2022   Grief reaction 06/14/2021   General weakness 06/12/2021   Current use of proton pump inhibitor 03/19/2021   Family history of prostate cancer 03/18/2021   Malignant neoplasm of upper-outer quadrant of left breast in female, estrogen receptor negative (HCC) 03/17/2021   Poor balance 05/07/2019   Exudative age-related macular degeneration, right eye, with active choroidal neovascularization (HCC) 09/04/2018   History of hip fracture 02/27/2018   Pedal edema 09/28/2017   Prediabetes 11/30/2016   At moderate risk for fall 09/05/2015   Cervical spondylosis without myelopathy 08/05/2015   Routine general medical examination at a health care facility 08/27/2014   Colon cancer screening 08/27/2014   Encounter for Medicare annual wellness  exam 07/25/2013   Varicosities of leg 05/23/2012   Dry eye syndrome 04/14/2012   Nonexudative senile macular degeneration of retina 04/14/2012   Fuchs' corneal dystrophy 04/14/2012   Pseudophakia 04/14/2012   Abnormal ultrasound of thyroid gland 03/01/2011   Leg cramps 05/27/2009   Hyperlipidemia 06/11/2008   Allergic rhinitis 03/29/2008   Essential hypertension, benign 12/26/2006   GERD 12/26/2006   Rheumatoid arthritis (HCC) 12/26/2006   Osteoporosis 12/26/2006   URINARY INCONTINENCE 12/26/2006    ONSET DATE: a few months per patient report   REFERRING DIAG: poor balance, history of fall, moderate risk for fall    THERAPY DIAG:  Unsteadiness on feet  Difficulty in walking, not elsewhere classified  Muscle weakness (generalized)  Rationale for Evaluation and Treatment: Rehabilitation  SUBJECTIVE:                                                                                                                                                                                             SUBJECTIVE STATEMENT:  Patient reports she is doing well. Reports no falls or LOB since last session, hasn't done her exercises due to moving to new facility.   Pt accompanied by: self  PERTINENT HISTORY:   Patient presents to physical therapy for balance and fall risk. Patient has been seen in the past at this clinic in 2021. PMH includes anemia, breast cancer 2023, diverticulosis of colon, Fuch's corneal dystrophy of R eye, GERD, bilateral hearing loss, HTN, HLD, macular degeneration of bilateral eyes, OA, OAB, osteopenia, urge incontinence. Patient had therapy a few months ago that was not helpful. Patient reports one fall about a month ago at home and another fall in [employer]; tripped on ramp and fell backwards.  Has dizziness with quick turns and leaning over.   PAIN:  Are you having pain?  Have headaches, affects balance None currently.  PRECAUTIONS: Fall  WEIGHT BEARING RESTRICTIONS: No  FALLS: Has patient fallen in last 6 months? Yes. Number of falls 2  LIVING ENVIRONMENT: Lives with: lives alone Lives in: House/apartment Stairs:  two levels, doesn't go upstairs, 4 stairs in garage, 2 in front, handrails can reach both Has following equipment at home: Quad cane small base and Walker - 2 wheeled  PLOF: Independent  PATIENT GOALS: to get stronger and more steady   OBJECTIVE:   COGNITION: Overall cognitive status: Within functional limits for tasks assessed   SENSATION: WFL  COORDINATION: Heel slide test: very challenging due to fatigue   POSTURE: rounded shoulders  Vitals: Seated:  113/64 Standing 110/68    LOWER EXTREMITY MMT:    MMT Right Eval Left Eval  Hip flexion 3+ 3+  Hip extension    Hip abduction 3+ 3+  Hip adduction 3+ 3+  Hip internal rotation    Hip external rotation    Knee flexion 4- 4-  Knee extension 3+ 4-  Ankle dorsiflexion 4- 4-  Ankle plantarflexion 4- 4-  Ankle inversion    Ankle eversion    (Blank rows = not tested)   TRANSFERS: Assistive device utilized: Single point cane  Sit to stand: SBA and CGA Stand to sit: SBA Chair to chair: CGA   GAIT: Gait pattern: step through pattern, decreased stride length, and narrow BOS Distance walked: 50 ft  Assistive device utilized:  hurrycane Level of assistance: CGA Comments: patient is challenged with quick steps   FUNCTIONAL TESTS:  5 times sit to stand: 22.71 10 meter walk test: 16.47 seconds with hurrycane  Berg Balance Scale: 28/56  PATIENT SURVEYS:  FOTO 40  TODAY'S TREATMENT:                                                                                                                              DATE: 08/19/22   TherEx: 3lb ankle weights ambulate 150 ft x3 sets; two near LOB requiring PT to keep patient upright due to not clearing foot.  4th set without weights with improved foot clearance  Seated lateral step over hedgehog 10x each LE 6" step seated toe taps 30 seconds x 2 trials   15x STS; focus on controlled eccentric portion with airex pad in chair    Neuro Re-ed: Airex pad: dual task word game with visual scans x 6 minutes  Tilt board: 20x     PATIENT EDUCATION: Education details: goals, POC Person educated: Patient Education method: Explanation, Demonstration, Tactile cues, and Verbal cues Education comprehension: verbalized understanding, returned demonstration, verbal cues required, and tactile cues required  HOME EXERCISE PROGRAM: Access Code: [email or web address] URL: [email or web address] Date: 07/14/2022 Prepared by: Precious Bard  Exercises - Seated Heel Toe Raises  - 1 x daily - 7 x weekly - 2 sets - 10 reps - 5 hold - Standing Tandem Balance with Counter Support  - 1 x daily - 7 x weekly - 2 sets - 2 reps - 30 hold - Standing March with Counter Support  - 1 x daily - 7 x weekly - 2 sets - 10 reps - 5 hold - Ankle Inversion Eversion Towel Slide  - 1 x daily - 7 x weekly - 1 sets - 15 reps - 5 hold  Access Code: [email or web address] URL: [email or web address] Date: 08/10/2022 Prepared by: Precious Bard  Exercises - Dead Bug with Swiss Ball  - 1 x daily - 7 x weekly - 2 sets - 10 reps - 5 hold - Seated Abdominal Press into Swiss Ball  - 1 x daily - 7 x weekly - 2 sets - 10 reps - 5 hold - Supine Hamstring Curl on Swiss Ball  - 1 x daily - 7  x weekly - 2 sets - 10 reps - 5 hold  GOALS: Goals reviewed with patient? Yes  SHORT TERM GOALS: Target date: 07/28/2022    Patient will be independent in home exercise program to improve strength/mobility for better functional independence with ADLs Baseline: 6/5: 7/16: intermittent compliance  Goal status: Partially Met    LONG TERM GOALS: Target date: 09/22/2022    Patient will increase FOTO score to equal to or greater than   47  to demonstrate statistically significant improvement in mobility and quality of life.  Baseline: 6/5: 40% 7/16: 56%  Goal status:MET  2. Patient (> 68 years old) will complete five times sit to stand test in < 15 seconds indicating an increased LE strength and improved balance. Baseline: 6/5: 22.71 seconds with BUE support 7/16: 21 with no hands Goal status: Partially Met   3.  Patient will increase 10 meter walk test to >1.81m/s as to improve gait speed for better community ambulation and to reduce fall risk. Baseline: 6/5: 16.47 seconds with hurrycane 7/16: 12 seconds no AD  Goal status: Partially Met   4.   Patient will demonstrate an improved Berg Balance Score of > 38 as to demonstrate improved balance with ADLs such as  sitting/standing and transfer balance and reduced fall risk.  Baseline: 6/5: 28/56 7/16: 39/56  Goal status: MET    ASSESSMENT:  CLINICAL IMPRESSION:  Patient introduced to ambulating with ankle weights. Patient does have multiple episode of LOB requiring PT assistance. Patient is improving eccentric control of sit to stands. She is improving in her stability and coordination each session with significant carryover between sessions.  Patient will benefit from skilled physical therapy to increase strength, stability, and mobility for improved quality of life.    OBJECTIVE IMPAIRMENTS: Abnormal gait, decreased activity tolerance, decreased balance, decreased coordination, decreased endurance, decreased mobility, difficulty walking, decreased strength, dizziness, impaired perceived functional ability, impaired flexibility, impaired vision/preception, and improper body mechanics.   ACTIVITY LIMITATIONS: carrying, lifting, bending, standing, squatting, stairs, transfers, bed mobility, dressing, reach over head, locomotion level, and caring for others  PARTICIPATION LIMITATIONS: meal prep, cleaning, laundry, driving, shopping, community activity, and yard work  PERSONAL FACTORS: anemia, breast cancer 2023, diverticulosis of colon, Fuch's corneal dystrophy of R eye, GERD, bilateral hearing loss, HTN, HLD, macular degeneration of bilateral eyes, OA, OAB, osteopenia, urge incontinence.  are also affecting patient's functional outcome.   REHAB POTENTIAL: Good  CLINICAL DECISION MAKING: Evolving/moderate complexity  EVALUATION COMPLEXITY: Moderate  PLAN:  PT FREQUENCY: 2x/week  PT DURATION: 12 weeks  PLANNED INTERVENTIONS: Therapeutic exercises, Therapeutic activity, Neuromuscular re-education, Balance training, Gait training, Patient/Family education, Self Care, Joint mobilization, Stair training, Vestibular training, Canalith repositioning, Visual/preceptual remediation/compensation, DME  instructions, Electrical stimulation, Cryotherapy, Moist heat, Manual lymph drainage, Splintting, Taping, Traction, Ultrasound, Manual therapy, and Re-evaluation  PLAN FOR NEXT SESSION:LE  Balance core strength.   Precious Bard PT  Physical Therapist - [place]  [facility]  4:15 PM 08/19/22

## 2022-08-19 ENCOUNTER — Ambulatory Visit: Payer: Medicare PPO

## 2022-08-19 DIAGNOSIS — R262 Difficulty in walking, not elsewhere classified: Secondary | ICD-10-CM | POA: Diagnosis not present

## 2022-08-19 DIAGNOSIS — M6281 Muscle weakness (generalized): Secondary | ICD-10-CM | POA: Diagnosis not present

## 2022-08-19 DIAGNOSIS — R2681 Unsteadiness on feet: Secondary | ICD-10-CM | POA: Diagnosis not present

## 2022-08-23 ENCOUNTER — Ambulatory Visit: Payer: Medicare PPO | Admitting: Physical Therapy

## 2022-08-23 DIAGNOSIS — R262 Difficulty in walking, not elsewhere classified: Secondary | ICD-10-CM | POA: Diagnosis not present

## 2022-08-23 DIAGNOSIS — M6281 Muscle weakness (generalized): Secondary | ICD-10-CM | POA: Diagnosis not present

## 2022-08-23 DIAGNOSIS — R2681 Unsteadiness on feet: Secondary | ICD-10-CM | POA: Diagnosis not present

## 2022-08-23 NOTE — Therapy (Signed)
OUTPATIENT PHYSICAL THERAPY NEURO TREATMENT NOTE   Patient Name: Sierra Bentley MRN: [id number] DOB:01/29/1933, 89 y.o., female Today's Date: 08/23/2022   PCP: Judy Pimple MD REFERRING PROVIDER: Judy Pimple MD   END OF SESSION:  PT End of Session - 08/23/22 1405     Visit Number 13    Number of Visits 24    Date for PT Re-Evaluation 09/22/22    Authorization Type 2/10    PT Start Time 1403    PT Stop Time 1443    PT Time Calculation (min) 40 min    Equipment Utilized During Treatment Gait belt    Activity Tolerance Patient tolerated treatment well    Behavior During Therapy WFL for tasks assessed/performed                        Past Medical History:  Diagnosis Date   Allergic rhinitis, cause unspecified    Anemia    Breast cancer (HCC)    left breast IDC 2023   Chronically dry eyes, bilateral    Diverticulosis of colon    Endometrial polyp    Family history of prostate cancer 03/18/2021   Fuchs' corneal dystrophy of right eye    GERD (gastroesophageal reflux disease)    Hearing loss of both ears    pt stated has hearing aids but does not wear   History of epistaxis    per pt has had several cautization's for nose bleed   History of radiation therapy    05/20/21-06/10/21- Dr. Antony Blackbird   HTN (hypertension)    Hyperlipidemia    Macular degeneration of both eyes    followed @[facility]  by dr Demetrius Charity. mettu;   both age-related and right is with active choroidal neovascularization   OA (osteoarthritis)    OAB (overactive bladder)    Osteopenia    Thickened endometrium    Urge incontinence of urine    Wears glasses    Past Surgical History:  Procedure Laterality Date   BREAST BIOPSY Left 2011   fibrocystic change   BREAST LUMPECTOMY WITH RADIOACTIVE SEED LOCALIZATION Left 04/13/2021   Procedure: LEFT BREAST LUMPECTOMY WITH RADIOACTIVE SEED LOCALIZATION;  Surgeon: Emelia Loron, MD;  Location: [facility];  Service: General;   Laterality: Left;   CATARACT EXTRACTION W/ INTRAOCULAR LENS IMPLANT Bilateral 2008   COLONOSCOPY  2013   DILATATION & CURETTAGE/HYSTEROSCOPY WITH MYOSURE N/A 11/26/2020   Procedure: DILATATION & CURETTAGE/HYSTEROSCOPY WITH MYOSURE;  Surgeon: Genia Del, MD;  Location: [facility];  Service: Gynecology;  Laterality: N/A;   TONSILLECTOMY  1962   Patient Active Problem List   Diagnosis Date Noted   History of fall 05/20/2022   Sacral pain 05/20/2022   SOB (shortness of breath) 05/20/2022   Dysuria 05/20/2022   Grief reaction 06/14/2021   General weakness 06/12/2021   Current use of proton pump inhibitor 03/19/2021   Family history of prostate cancer 03/18/2021   Malignant neoplasm of upper-outer quadrant of left breast in female, estrogen receptor negative (HCC) 03/17/2021   Poor balance 05/07/2019   Exudative age-related macular degeneration, right eye, with active choroidal neovascularization (HCC) 09/04/2018   History of hip fracture 02/27/2018   Pedal edema 09/28/2017   Prediabetes 11/30/2016   At moderate risk for fall 09/05/2015   Cervical spondylosis without myelopathy 08/05/2015   Routine general medical examination at a health care facility 08/27/2014   Colon cancer screening 08/27/2014   Encounter for Medicare annual  wellness exam 07/25/2013   Varicosities of leg 05/23/2012   Dry eye syndrome 04/14/2012   Nonexudative senile macular degeneration of retina 04/14/2012   Fuchs' corneal dystrophy 04/14/2012   Pseudophakia 04/14/2012   Abnormal ultrasound of thyroid gland 03/01/2011   Leg cramps 05/27/2009   Hyperlipidemia 06/11/2008   Allergic rhinitis 03/29/2008   Essential hypertension, benign 12/26/2006   GERD 12/26/2006   Rheumatoid arthritis (HCC) 12/26/2006   Osteoporosis 12/26/2006   URINARY INCONTINENCE 12/26/2006    ONSET DATE: a few months per patient report   REFERRING DIAG: poor balance, history of fall, moderate risk for fall    THERAPY DIAG:  Unsteadiness on feet  Difficulty in walking, not elsewhere classified  Muscle weakness (generalized)  Rationale for Evaluation and Treatment: Rehabilitation  SUBJECTIVE:                                                                                                                                                                                             SUBJECTIVE STATEMENT:  Patient reports she is doing well. Reports no falls or LOB since last session, hasn't done her exercises due to moving to new facility.   Pt accompanied by: self  PERTINENT HISTORY:   Patient presents to physical therapy for balance and fall risk. Patient has been seen in the past at this clinic in 2021. PMH includes anemia, breast cancer 2023, diverticulosis of colon, Fuch's corneal dystrophy of R eye, GERD, bilateral hearing loss, HTN, HLD, macular degeneration of bilateral eyes, OA, OAB, osteopenia, urge incontinence. Patient had therapy a few months ago that was not helpful. Patient reports one fall about a month ago at home and another fall in [employer]; tripped on ramp and fell backwards.  Has dizziness with quick turns and leaning over.   PAIN:  Are you having pain?  Have headaches, affects balance None currently.  PRECAUTIONS: Fall  WEIGHT BEARING RESTRICTIONS: No  FALLS: Has patient fallen in last 6 months? Yes. Number of falls 2  LIVING ENVIRONMENT: Lives with: lives alone Lives in: House/apartment Stairs:  two levels, doesn't go upstairs, 4 stairs in garage, 2 in front, handrails can reach both Has following equipment at home: Quad cane small base and Walker - 2 wheeled  PLOF: Independent  PATIENT GOALS: to get stronger and more steady   OBJECTIVE:   COGNITION: Overall cognitive status: Within functional limits for tasks assessed   SENSATION: WFL  COORDINATION: Heel slide test: very challenging due to fatigue   POSTURE: rounded shoulders  Vitals: Seated:  113/64 Standing 110/68    LOWER EXTREMITY MMT:    MMT Right Eval Left  Eval  Hip flexion 3+ 3+  Hip extension    Hip abduction 3+ 3+  Hip adduction 3+ 3+  Hip internal rotation    Hip external rotation    Knee flexion 4- 4-  Knee extension 3+ 4-  Ankle dorsiflexion 4- 4-  Ankle plantarflexion 4- 4-  Ankle inversion    Ankle eversion    (Blank rows = not tested)   TRANSFERS: Assistive device utilized: Single point cane  Sit to stand: SBA and CGA Stand to sit: SBA Chair to chair: CGA   GAIT: Gait pattern: step through pattern, decreased stride length, and narrow BOS Distance walked: 50 ft  Assistive device utilized:  hurrycane Level of assistance: CGA Comments: patient is challenged with quick steps   FUNCTIONAL TESTS:  5 times sit to stand: 22.71 10 meter walk test: 16.47 seconds with hurrycane  Berg Balance Scale: 28/56  PATIENT SURVEYS:  FOTO 40  TODAY'S TREATMENT:                                                                                                                              DATE: 08/23/22    Sitting: 133.63 HR 75  Standing 0 min: 138/70 HR 79  Standing 1 min:143/72 HR 79   Throughout session, pt performed all sit<>stand transfers with distant supervision assist for safety. No reported s/s of orthostatic s/s throughout session    Gait without AD x 165ft then performed 2 x 197ft with 3# ankle weights cues for direction, and safety in turns, but no LOB.   Dynamic gait with 3# ankle weights to navigate obstacle course to weave through 10 cones and step over 2 canes on floor. No LOB noted.  Additional management of Obstacle course to navigate 8 cones, step over 1 cane, up/down 4inch step, across balance pad; performed x 4 bouts; mild LOB with stair ascent and unto balance pad, requiring min assist from PT for safety to prevent posterior LOB.    Standing on airex pad:  Static stance: 3 x 30 sec with tactile cues from PT to improve awareness of  posterior LOB.  VOR 1: 2 x 30sec VOR 2: 2 x 30 sec each.  Eyes open/eyes closed: 10 sec x 4 bouts   Mild LOB laterally and posteriorly throughout session, able to correct with use of ankle strategy as PT improved awareness of LOB to prevent fall.       PATIENT EDUCATION: Education details: goals, POC Person educated: Patient Education method: Explanation, Demonstration, Tactile cues, and Verbal cues Education comprehension: verbalized understanding, returned demonstration, verbal cues required, and tactile cues required  HOME EXERCISE PROGRAM: Access Code: [email or web address] URL: [email or web address] Date: 07/14/2022 Prepared by: Precious Bard  Exercises - Seated Heel Toe Raises  - 1 x daily - 7 x weekly - 2 sets - 10 reps - 5 hold - Standing Tandem Balance with Counter Support  - 1 x daily - 7 x weekly - 2 sets -  2 reps - 30 hold - Standing March with Counter Support  - 1 x daily - 7 x weekly - 2 sets - 10 reps - 5 hold - Ankle Inversion Eversion Towel Slide  - 1 x daily - 7 x weekly - 1 sets - 15 reps - 5 hold  Access Code: [email or web address] URL: [email or web address] Date: 08/10/2022 Prepared by: Precious Bard  Exercises - Dead Bug with Swiss Ball  - 1 x daily - 7 x weekly - 2 sets - 10 reps - 5 hold - Seated Abdominal Press into Swiss Ball  - 1 x daily - 7 x weekly - 2 sets - 10 reps - 5 hold - Supine Hamstring Curl on Swiss Ball  - 1 x daily - 7 x weekly - 2 sets - 10 reps - 5 hold  GOALS: Goals reviewed with patient? Yes  SHORT TERM GOALS: Target date: 07/28/2022    Patient will be independent in home exercise program to improve strength/mobility for better functional independence with ADLs Baseline: 6/5: 7/16: intermittent compliance  Goal status: Partially Met    LONG TERM GOALS: Target date: 09/22/2022    Patient will increase FOTO score to equal to or greater than   47  to demonstrate statistically significant improvement in mobility and quality  of life.  Baseline: 6/5: 40% 7/16: 56%  Goal status:MET  2. Patient (> 12 years old) will complete five times sit to stand test in < 15 seconds indicating an increased LE strength and improved balance. Baseline: 6/5: 22.71 seconds with BUE support 7/16: 21 with no hands Goal status: Partially Met   3.  Patient will increase 10 meter walk test to >1.11m/s as to improve gait speed for better community ambulation and to reduce fall risk. Baseline: 6/5: 16.47 seconds with hurrycane 7/16: 12 seconds no AD  Goal status: Partially Met   4.   Patient will demonstrate an improved Berg Balance Score of > 38 as to demonstrate improved balance with ADLs such as sitting/standing and transfer balance and reduced fall risk.  Baseline: 6/5: 28/56 7/16: 39/56  Goal status: MET    ASSESSMENT:  CLINICAL IMPRESSION:  Patient presented to PT motivated to continue improvement of balance. Pt continued weighted gait and dynamic balance training with reduced LOB compared to prior session as well as COM management on unstable surface with use of ankle strategy to correct posterior Bias, but required tactile cues for awareness of LOB. No dizziness with VOR, but difficulty with gaze stabilization with head nod.  Patient will benefit from skilled physical therapy to increase strength, stability, and mobility for improved quality of life.    OBJECTIVE IMPAIRMENTS: Abnormal gait, decreased activity tolerance, decreased balance, decreased coordination, decreased endurance, decreased mobility, difficulty walking, decreased strength, dizziness, impaired perceived functional ability, impaired flexibility, impaired vision/preception, and improper body mechanics.   ACTIVITY LIMITATIONS: carrying, lifting, bending, standing, squatting, stairs, transfers, bed mobility, dressing, reach over head, locomotion level, and caring for others  PARTICIPATION LIMITATIONS: meal prep, cleaning, laundry, driving, shopping, community  activity, and yard work  PERSONAL FACTORS: anemia, breast cancer 2023, diverticulosis of colon, Fuch's corneal dystrophy of R eye, GERD, bilateral hearing loss, HTN, HLD, macular degeneration of bilateral eyes, OA, OAB, osteopenia, urge incontinence.  are also affecting patient's functional outcome.   REHAB POTENTIAL: Good  CLINICAL DECISION MAKING: Evolving/moderate complexity  EVALUATION COMPLEXITY: Moderate  PLAN:  PT FREQUENCY: 2x/week  PT DURATION: 12 weeks  PLANNED INTERVENTIONS: Therapeutic exercises, Therapeutic activity, Neuromuscular re-education, Balance training,  Gait training, Patient/Family education, Self Care, Joint mobilization, Stair training, Vestibular training, Canalith repositioning, Visual/preceptual remediation/compensation, DME instructions, Electrical stimulation, Cryotherapy, Moist heat, Manual lymph drainage, Splintting, Taping, Traction, Ultrasound, Manual therapy, and Re-evaluation  PLAN FOR NEXT SESSION:LE   Balance core strength. Weighted gait.   Golden Pop PT  Physical Therapist - [place]  [facility]  2:50 PM 08/23/22

## 2022-08-25 ENCOUNTER — Ambulatory Visit: Payer: Medicare PPO | Admitting: Physical Therapy

## 2022-08-25 DIAGNOSIS — M6281 Muscle weakness (generalized): Secondary | ICD-10-CM

## 2022-08-25 DIAGNOSIS — R2681 Unsteadiness on feet: Secondary | ICD-10-CM

## 2022-08-25 DIAGNOSIS — R262 Difficulty in walking, not elsewhere classified: Secondary | ICD-10-CM | POA: Diagnosis not present

## 2022-08-25 NOTE — Therapy (Signed)
OUTPATIENT PHYSICAL THERAPY NEURO TREATMENT NOTE   Patient Name: Sierra Bentley MRN: [id number] DOB:[birth year], 89 y.o., female Today's Date: 08/25/2022   PCP: Judy Pimple MD REFERRING PROVIDER: Judy Pimple MD   END OF SESSION:  PT End of Session - 08/25/22 0956     Visit Number 14    Number of Visits 24    Date for PT Re-Evaluation 09/22/22    Authorization Type 2/10    PT Start Time 0932    PT Stop Time 1015    PT Time Calculation (min) 43 min    Equipment Utilized During Treatment Gait belt    Activity Tolerance Patient tolerated treatment well    Behavior During Therapy WFL for tasks assessed/performed                        Past Medical History:  Diagnosis Date   Allergic rhinitis, cause unspecified    Anemia    Breast cancer (HCC)    left breast IDC 2023   Chronically dry eyes, bilateral    Diverticulosis of colon    Endometrial polyp    Family history of prostate cancer 03/18/2021   Fuchs' corneal dystrophy of right eye    GERD (gastroesophageal reflux disease)    Hearing loss of both ears    pt stated has hearing aids but does not wear   History of epistaxis    per pt has had several cautization's for nose bleed   History of radiation therapy    05/20/21-06/10/21- Dr. Antony Blackbird   HTN (hypertension)    Hyperlipidemia    Macular degeneration of both eyes    followed @[facility]  by dr Demetrius Charity. mettu;   both age-related and right is with active choroidal neovascularization   OA (osteoarthritis)    OAB (overactive bladder)    Osteopenia    Thickened endometrium    Urge incontinence of urine    Wears glasses    Past Surgical History:  Procedure Laterality Date   BREAST BIOPSY Left 2011   fibrocystic change   BREAST LUMPECTOMY WITH RADIOACTIVE SEED LOCALIZATION Left 04/13/2021   Procedure: LEFT BREAST LUMPECTOMY WITH RADIOACTIVE SEED LOCALIZATION;  Surgeon: Emelia Loron, MD;  Location: [facility];  Service: General;   Laterality: Left;   CATARACT EXTRACTION W/ INTRAOCULAR LENS IMPLANT Bilateral 2008   COLONOSCOPY  2013   DILATATION & CURETTAGE/HYSTEROSCOPY WITH MYOSURE N/A 11/26/2020   Procedure: DILATATION & CURETTAGE/HYSTEROSCOPY WITH MYOSURE;  Surgeon: Genia Del, MD;  Location: [facility];  Service: Gynecology;  Laterality: N/A;   TONSILLECTOMY  1962   Patient Active Problem List   Diagnosis Date Noted   History of fall 05/20/2022   Sacral pain 05/20/2022   SOB (shortness of breath) 05/20/2022   Dysuria 05/20/2022   Grief reaction 06/14/2021   General weakness 06/12/2021   Current use of proton pump inhibitor 03/19/2021   Family history of prostate cancer 03/18/2021   Malignant neoplasm of upper-outer quadrant of left breast in female, estrogen receptor negative (HCC) 03/17/2021   Poor balance 05/07/2019   Exudative age-related macular degeneration, right eye, with active choroidal neovascularization (HCC) 09/04/2018   History of hip fracture 02/27/2018   Pedal edema 09/28/2017   Prediabetes 11/30/2016   At moderate risk for fall 09/05/2015   Cervical spondylosis without myelopathy 08/05/2015   Routine general medical examination at a health care facility 08/27/2014   Colon cancer screening 08/27/2014   Encounter for Medicare annual  wellness exam 07/25/2013   Varicosities of leg 05/23/2012   Dry eye syndrome 04/14/2012   Nonexudative senile macular degeneration of retina 04/14/2012   Fuchs' corneal dystrophy 04/14/2012   Pseudophakia 04/14/2012   Abnormal ultrasound of thyroid gland 03/01/2011   Leg cramps 05/27/2009   Hyperlipidemia 06/11/2008   Allergic rhinitis 03/29/2008   Essential hypertension, benign 12/26/2006   GERD 12/26/2006   Rheumatoid arthritis (HCC) 12/26/2006   Osteoporosis 12/26/2006   URINARY INCONTINENCE 12/26/2006    ONSET DATE: a few months per patient report   REFERRING DIAG: poor balance, history of fall, moderate risk for fall    THERAPY DIAG:  Unsteadiness on feet  Difficulty in walking, not elsewhere classified  Muscle weakness (generalized)  Rationale for Evaluation and Treatment: Rehabilitation  SUBJECTIVE:                                                                                                                                                                                             SUBJECTIVE STATEMENT:  Patient reports she is doing well. No updates since last session medically, but states that she had a hard time sleeping last night due to [facility] going out. Was able to get it restarted late last night.   Pt accompanied by: self  PERTINENT HISTORY:   Patient presents to physical therapy for balance and fall risk. Patient has been seen in the past at this clinic in 2021. PMH includes anemia, breast cancer 2023, diverticulosis of colon, Fuch's corneal dystrophy of R eye, GERD, bilateral hearing loss, HTN, HLD, macular degeneration of bilateral eyes, OA, OAB, osteopenia, urge incontinence. Patient had therapy a few months ago that was not helpful. Patient reports one fall about a month ago at home and another fall in [employer]; tripped on ramp and fell backwards.  Has dizziness with quick turns and leaning over.   PAIN:  Are you having pain?  Have headaches, affects balance None currently.  PRECAUTIONS: Fall  WEIGHT BEARING RESTRICTIONS: No  FALLS: Has patient fallen in last 6 months? Yes. Number of falls 2  LIVING ENVIRONMENT: Lives with: lives alone Lives in: House/apartment Stairs:  two levels, doesn't go upstairs, 4 stairs in garage, 2 in front, handrails can reach both Has following equipment at home: Quad cane small base and Walker - 2 wheeled  PLOF: Independent  PATIENT GOALS: to get stronger and more steady   OBJECTIVE:   COGNITION: Overall cognitive status: Within functional limits for tasks assessed   SENSATION: WFL  COORDINATION: Heel slide test: very challenging due to  fatigue   POSTURE: rounded shoulders  Vitals: Seated: 113/64 Standing 110/68  LOWER EXTREMITY MMT:    MMT Right Eval Left Eval  Hip flexion 3+ 3+  Hip extension    Hip abduction 3+ 3+  Hip adduction 3+ 3+  Hip internal rotation    Hip external rotation    Knee flexion 4- 4-  Knee extension 3+ 4-  Ankle dorsiflexion 4- 4-  Ankle plantarflexion 4- 4-  Ankle inversion    Ankle eversion    (Blank rows = not tested)   TRANSFERS: Assistive device utilized: Single point cane  Sit to stand: SBA and CGA Stand to sit: SBA Chair to chair: CGA   GAIT: Gait pattern: step through pattern, decreased stride length, and narrow BOS Distance walked: 50 ft  Assistive device utilized:  hurrycane Level of assistance: CGA Comments: patient is challenged with quick steps   FUNCTIONAL TESTS:  5 times sit to stand: 22.71 10 meter walk test: 16.47 seconds with hurrycane  Berg Balance Scale: 28/56  PATIENT SURVEYS:  FOTO 40  TODAY'S TREATMENT:                                                                                                                              DATE: 08/25/22   Pt reports no s/s of orthostatic hypotension initially.  Throughout session, pt performed all sit<>stand transfers with distant supervision assist for safety.    Gait without AD x 131ft then performed 2 x 138ft tossing ball to eye level while navigating obstacles in gym and in hall. Additional gait with 4# ankle weights 2 x 128ft cues for direction, and safety in turns, but no LOB.   After first bout for weighted gait pt reports dizziness/lightheadedness. Returned to sitting with assist from PT for VS assessment   Orthostatic VS assessed:  Sitting: 114/62 HR82 Standing 117/69 HR92 Standing 1 min 132/67 HR93 Pt then able to complete second bout of weighted gait training.  Standing following weighted gait training: 132/65 HR 87 Sitting following activity: 125/62 HR 85  Seated LAQ with 4#ankle  weights x 8 bil with 3 sec hold  Sit<>stand x 5 with CGA and no UE support. No dizziness.   CGA provided by PT throughout session for safety with min cues for step height and attention to task when tossing ball. When pt reported dizziness, required min assist to prevent lateral LOB, but not present for all other transfers throughout sesion   PATIENT EDUCATION: Education details: goals, POC Person educated: Patient Education method: Explanation, Demonstration, Tactile cues, and Verbal cues Education comprehension: verbalized understanding, returned demonstration, verbal cues required, and tactile cues required  HOME EXERCISE PROGRAM: Access Code: [email or web address] URL: [email or web address] Date: 07/14/2022 Prepared by: Precious Bard  Exercises - Seated Heel Toe Raises  - 1 x daily - 7 x weekly - 2 sets - 10 reps - 5 hold - Standing Tandem Balance with Counter Support  - 1 x daily - 7 x weekly - 2 sets - 2 reps - 30 hold - Standing  March with Counter Support  - 1 x daily - 7 x weekly - 2 sets - 10 reps - 5 hold - Ankle Inversion Eversion Towel Slide  - 1 x daily - 7 x weekly - 1 sets - 15 reps - 5 hold  Access Code: [email or web address] URL: [email or web address] Date: 08/10/2022 Prepared by: Precious Bard  Exercises - Dead Bug with Swiss Ball  - 1 x daily - 7 x weekly - 2 sets - 10 reps - 5 hold - Seated Abdominal Press into Swiss Ball  - 1 x daily - 7 x weekly - 2 sets - 10 reps - 5 hold - Supine Hamstring Curl on Swiss Ball  - 1 x daily - 7 x weekly - 2 sets - 10 reps - 5 hold  GOALS: Goals reviewed with patient? Yes  SHORT TERM GOALS: Target date: 07/28/2022    Patient will be independent in home exercise program to improve strength/mobility for better functional independence with ADLs Baseline: 6/5: 7/16: intermittent compliance  Goal status: Partially Met    LONG TERM GOALS: Target date: 09/22/2022    Patient will increase FOTO score to equal to or greater  than   47  to demonstrate statistically significant improvement in mobility and quality of life.  Baseline: 6/5: 40% 7/16: 56%  Goal status:MET  2. Patient (> 20 years old) will complete five times sit to stand test in < 15 seconds indicating an increased LE strength and improved balance. Baseline: 6/5: 22.71 seconds with BUE support 7/16: 21 with no hands Goal status: Partially Met   3.  Patient will increase 10 meter walk test to >1.52m/s as to improve gait speed for better community ambulation and to reduce fall risk. Baseline: 6/5: 16.47 seconds with hurrycane 7/16: 12 seconds no AD  Goal status: Partially Met   4.   Patient will demonstrate an improved Berg Balance Score of > 38 as to demonstrate improved balance with ADLs such as sitting/standing and transfer balance and reduced fall risk.  Baseline: 6/5: 28/56 7/16: 39/56  Goal status: MET    ASSESSMENT:  CLINICAL IMPRESSION:  Patient presented to PT motivated to continue improvement of balance. Pt performed variable gait training with varied challenges to address functional balance and hip weakness. No overt LOB. Pt does report 1 instance of dizziness from sit<>stan but VS WNL and no additional s/s throughout session.  Patient will benefit from skilled physical therapy to increase strength, stability, and mobility for improved quality of life.    OBJECTIVE IMPAIRMENTS: Abnormal gait, decreased activity tolerance, decreased balance, decreased coordination, decreased endurance, decreased mobility, difficulty walking, decreased strength, dizziness, impaired perceived functional ability, impaired flexibility, impaired vision/preception, and improper body mechanics.   ACTIVITY LIMITATIONS: carrying, lifting, bending, standing, squatting, stairs, transfers, bed mobility, dressing, reach over head, locomotion level, and caring for others  PARTICIPATION LIMITATIONS: meal prep, cleaning, laundry, driving, shopping, community activity, and  yard work  PERSONAL FACTORS: anemia, breast cancer 2023, diverticulosis of colon, Fuch's corneal dystrophy of R eye, GERD, bilateral hearing loss, HTN, HLD, macular degeneration of bilateral eyes, OA, OAB, osteopenia, urge incontinence.  are also affecting patient's functional outcome.   REHAB POTENTIAL: Good  CLINICAL DECISION MAKING: Evolving/moderate complexity  EVALUATION COMPLEXITY: Moderate  PLAN:  PT FREQUENCY: 2x/week  PT DURATION: 12 weeks  PLANNED INTERVENTIONS: Therapeutic exercises, Therapeutic activity, Neuromuscular re-education, Balance training, Gait training, Patient/Family education, Self Care, Joint mobilization, Stair training, Vestibular training, Canalith repositioning, Visual/preceptual remediation/compensation, DME instructions, Electrical stimulation, Cryotherapy, Moist heat,  Manual lymph drainage, Splintting, Taping, Traction, Ultrasound, Manual therapy, and Re-evaluation  PLAN FOR NEXT SESSION:LE   Dynamic Balance core strength. Weighted gait.   Golden Pop PT  Physical Therapist - [facility]  [facility]  11:24 AM 08/25/22

## 2022-08-30 ENCOUNTER — Ambulatory Visit: Payer: Medicare PPO | Admitting: Physical Therapy

## 2022-09-01 ENCOUNTER — Ambulatory Visit: Payer: Medicare PPO

## 2022-09-02 ENCOUNTER — Ambulatory Visit: Payer: Medicare PPO | Attending: Family Medicine | Admitting: Physical Therapy

## 2022-09-02 DIAGNOSIS — R2681 Unsteadiness on feet: Secondary | ICD-10-CM | POA: Diagnosis not present

## 2022-09-02 DIAGNOSIS — M6281 Muscle weakness (generalized): Secondary | ICD-10-CM | POA: Insufficient documentation

## 2022-09-02 DIAGNOSIS — R262 Difficulty in walking, not elsewhere classified: Secondary | ICD-10-CM | POA: Insufficient documentation

## 2022-09-02 NOTE — Therapy (Signed)
OUTPATIENT PHYSICAL THERAPY NEURO TREATMENT NOTE   Patient Name: Sierra Bentley MRN: [id number] DOB:1934-01-09, 89 y.o., female Today's Date: 09/02/2022   PCP: Judy Pimple MD REFERRING PROVIDER: Judy Pimple MD   END OF SESSION:  PT End of Session - 09/02/22 2130     Visit Number 15    Number of Visits 24    Date for PT Re-Evaluation 09/22/22    Authorization Type 2/10    PT Start Time 0930    PT Stop Time 1010    PT Time Calculation (min) 40 min    Equipment Utilized During Treatment Gait belt    Activity Tolerance Patient tolerated treatment well    Behavior During Therapy WFL for tasks assessed/performed                        Past Medical History:  Diagnosis Date   Allergic rhinitis, cause unspecified    Anemia    Breast cancer (HCC)    left breast IDC 2023   Chronically dry eyes, bilateral    Diverticulosis of colon    Endometrial polyp    Family history of prostate cancer 03/18/2021   Fuchs' corneal dystrophy of right eye    GERD (gastroesophageal reflux disease)    Hearing loss of both ears    pt stated has hearing aids but does not wear   History of epistaxis    per pt has had several cautization's for nose bleed   History of radiation therapy    05/20/21-06/10/21- Dr. Antony Blackbird   HTN (hypertension)    Hyperlipidemia    Macular degeneration of both eyes    followed @[facility]  by dr Demetrius Charity. mettu;   both age-related and right is with active choroidal neovascularization   OA (osteoarthritis)    OAB (overactive bladder)    Osteopenia    Thickened endometrium    Urge incontinence of urine    Wears glasses    Past Surgical History:  Procedure Laterality Date   BREAST BIOPSY Left 2011   fibrocystic change   BREAST LUMPECTOMY WITH RADIOACTIVE SEED LOCALIZATION Left 04/13/2021   Procedure: LEFT BREAST LUMPECTOMY WITH RADIOACTIVE SEED LOCALIZATION;  Surgeon: Emelia Loron, MD;  Location: [facility];  Service: General;   Laterality: Left;   CATARACT EXTRACTION W/ INTRAOCULAR LENS IMPLANT Bilateral 2008   COLONOSCOPY  2013   DILATATION & CURETTAGE/HYSTEROSCOPY WITH MYOSURE N/A 11/26/2020   Procedure: DILATATION & CURETTAGE/HYSTEROSCOPY WITH MYOSURE;  Surgeon: Genia Del, MD;  Location: [facility];  Service: Gynecology;  Laterality: N/A;   TONSILLECTOMY  1962   Patient Active Problem List   Diagnosis Date Noted   History of fall 05/20/2022   Sacral pain 05/20/2022   SOB (shortness of breath) 05/20/2022   Dysuria 05/20/2022   Grief reaction 06/14/2021   General weakness 06/12/2021   Current use of proton pump inhibitor 03/19/2021   Family history of prostate cancer 03/18/2021   Malignant neoplasm of upper-outer quadrant of left breast in female, estrogen receptor negative (HCC) 03/17/2021   Poor balance 05/07/2019   Exudative age-related macular degeneration, right eye, with active choroidal neovascularization (HCC) 09/04/2018   History of hip fracture 02/27/2018   Pedal edema 09/28/2017   Prediabetes 11/30/2016   At moderate risk for fall 09/05/2015   Cervical spondylosis without myelopathy 08/05/2015   Routine general medical examination at a health care facility 08/27/2014   Colon cancer screening 08/27/2014   Encounter for Medicare annual  wellness exam 07/25/2013   Varicosities of leg 05/23/2012   Dry eye syndrome 04/14/2012   Nonexudative senile macular degeneration of retina 04/14/2012   Fuchs' corneal dystrophy 04/14/2012   Pseudophakia 04/14/2012   Abnormal ultrasound of thyroid gland 03/01/2011   Leg cramps 05/27/2009   Hyperlipidemia 06/11/2008   Allergic rhinitis 03/29/2008   Essential hypertension, benign 12/26/2006   GERD 12/26/2006   Rheumatoid arthritis (HCC) 12/26/2006   Osteoporosis 12/26/2006   URINARY INCONTINENCE 12/26/2006    ONSET DATE: a few months per patient report   REFERRING DIAG: poor balance, history of fall, moderate risk for fall    THERAPY DIAG:  Unsteadiness on feet  Difficulty in walking, not elsewhere classified  Muscle weakness (generalized)  Rationale for Evaluation and Treatment: Rehabilitation  SUBJECTIVE:                                                                                                                                                                                             SUBJECTIVE STATEMENT:  Patient reports she is doing well. No dizziness on this day. Missed alst PT treatment due to "not feeling well" Was brought to PT treatment by family, due to concerns with driving in bad weather.   Pt accompanied by: self  PERTINENT HISTORY:   Patient presents to physical therapy for balance and fall risk. Patient has been seen in the past at this clinic in 2021. PMH includes anemia, breast cancer 2023, diverticulosis of colon, Fuch's corneal dystrophy of R eye, GERD, bilateral hearing loss, HTN, HLD, macular degeneration of bilateral eyes, OA, OAB, osteopenia, urge incontinence. Patient had therapy a few months ago that was not helpful. Patient reports one fall about a month ago at home and another fall in [employer]; tripped on ramp and fell backwards.  Has dizziness with quick turns and leaning over.   PAIN:  Are you having pain?  Have headaches, affects balance None currently.  PRECAUTIONS: Fall  WEIGHT BEARING RESTRICTIONS: No  FALLS: Has patient fallen in last 6 months? Yes. Number of falls 2  LIVING ENVIRONMENT: Lives with: lives alone Lives in: House/apartment Stairs:  two levels, doesn't go upstairs, 4 stairs in garage, 2 in front, handrails can reach both Has following equipment at home: Quad cane small base and Walker - 2 wheeled  PLOF: Independent  PATIENT GOALS: to get stronger and more steady   OBJECTIVE:   COGNITION: Overall cognitive status: Within functional limits for tasks assessed   SENSATION: WFL  COORDINATION: Heel slide test: very challenging due to  fatigue   POSTURE: rounded shoulders  Vitals: Seated: 113/64 Standing 110/68  LOWER EXTREMITY MMT:    MMT Right Eval Left Eval  Hip flexion 3+ 3+  Hip extension    Hip abduction 3+ 3+  Hip adduction 3+ 3+  Hip internal rotation    Hip external rotation    Knee flexion 4- 4-  Knee extension 3+ 4-  Ankle dorsiflexion 4- 4-  Ankle plantarflexion 4- 4-  Ankle inversion    Ankle eversion    (Blank rows = not tested)   TRANSFERS: Assistive device utilized: Single point cane  Sit to stand: SBA and CGA Stand to sit: SBA Chair to chair: CGA   GAIT: Gait pattern: step through pattern, decreased stride length, and narrow BOS Distance walked: 50 ft  Assistive device utilized:  hurrycane Level of assistance: CGA Comments: patient is challenged with quick steps   FUNCTIONAL TESTS:  5 times sit to stand: 22.71 10 meter walk test: 16.47 seconds with hurrycane  Berg Balance Scale: 28/56  PATIENT SURVEYS:  FOTO 40  TODAY'S TREATMENT:                                                                                                                              DATE: 09/02/22   Pt reports no s/s of orthostatic hypotension   Nustep level 2>5 x 4 min then 1 min level 1 for cool down.  Standing on airex pad no UE support 3 x 30 sec  Semitandem on airex pad with 1 LE on four inch step 3 x 15 sec  Tandem stance with finger time support on rails 3 x 15 sec bil  Calf raise x 15 with UE support on rails  Seated hip abduction x 15 with 3 sec hold RTB Sit<>stand with RTB at knees 2 x 5  Seated hip flexion x 15 bil RTB  Standing trunk rotation with RTB x 10 bil  Sit<>stand pushing from tighs x 8 with min assist for anterior weight shift.   Throughout session, pt performed all sit<>stand transfers with distant supervision assist for safety.   Throughout session PT provided CGA for safety with all dynamic balance and standing tasks for improved safety and weight shift to correct  poor COM control.    PATIENT EDUCATION: Education details: goals, POC Person educated: Patient Education method: Explanation, Demonstration, Tactile cues, and Verbal cues Education comprehension: verbalized understanding, returned demonstration, verbal cues required, and tactile cues required  HOME EXERCISE PROGRAM: Access Code: [email or web address] URL: [email or web address] Date: 07/14/2022 Prepared by: Precious Bard  Exercises - Seated Heel Toe Raises  - 1 x daily - 7 x weekly - 2 sets - 10 reps - 5 hold - Standing Tandem Balance with Counter Support  - 1 x daily - 7 x weekly - 2 sets - 2 reps - 30 hold - Standing March with Counter Support  - 1 x daily - 7 x weekly - 2 sets - 10 reps - 5 hold - Ankle Inversion Eversion Towel Slide  - 1  x daily - 7 x weekly - 1 sets - 15 reps - 5 hold  Access Code: [email or web address] URL: [email or web address] Date: 08/10/2022 Prepared by: Precious Bard  Exercises - Dead Bug with Swiss Ball  - 1 x daily - 7 x weekly - 2 sets - 10 reps - 5 hold - Seated Abdominal Press into Swiss Ball  - 1 x daily - 7 x weekly - 2 sets - 10 reps - 5 hold - Supine Hamstring Curl on Swiss Ball  - 1 x daily - 7 x weekly - 2 sets - 10 reps - 5 hold  GOALS: Goals reviewed with patient? Yes  SHORT TERM GOALS: Target date: 07/28/2022    Patient will be independent in home exercise program to improve strength/mobility for better functional independence with ADLs Baseline: 6/5: 7/16: intermittent compliance  Goal status: Partially Met    LONG TERM GOALS: Target date: 09/22/2022    Patient will increase FOTO score to equal to or greater than   47  to demonstrate statistically significant improvement in mobility and quality of life.  Baseline: 6/5: 40% 7/16: 56%  Goal status:MET  2. Patient (> 40 years old) will complete five times sit to stand test in < 15 seconds indicating an increased LE strength and improved balance. Baseline: 6/5: 22.71 seconds with  BUE support 7/16: 21 with no hands Goal status: Partially Met   3.  Patient will increase 10 meter walk test to >1.21m/s as to improve gait speed for better community ambulation and to reduce fall risk. Baseline: 6/5: 16.47 seconds with hurrycane 7/16: 12 seconds no AD  Goal status: Partially Met   4.   Patient will demonstrate an improved Berg Balance Score of > 38 as to demonstrate improved balance with ADLs such as sitting/standing and transfer balance and reduced fall risk.  Baseline: 6/5: 28/56 7/16: 39/56  Goal status: MET    ASSESSMENT:  CLINICAL IMPRESSION:  Patient presented to PT motivated to continue improvement of balance. PT instructed pt in standing balance and strengthening to address BLE and core weakness. Min cues from PT for use of ankle strategy to correct lateral LOB on airex pad and improved engagement of deep core to improve postural response to LOB.   Patient will benefit from skilled physical therapy to increase strength, stability, and mobility for improved quality of life.    OBJECTIVE IMPAIRMENTS: Abnormal gait, decreased activity tolerance, decreased balance, decreased coordination, decreased endurance, decreased mobility, difficulty walking, decreased strength, dizziness, impaired perceived functional ability, impaired flexibility, impaired vision/preception, and improper body mechanics.   ACTIVITY LIMITATIONS: carrying, lifting, bending, standing, squatting, stairs, transfers, bed mobility, dressing, reach over head, locomotion level, and caring for others  PARTICIPATION LIMITATIONS: meal prep, cleaning, laundry, driving, shopping, community activity, and yard work  PERSONAL FACTORS: anemia, breast cancer 2023, diverticulosis of colon, Fuch's corneal dystrophy of R eye, GERD, bilateral hearing loss, HTN, HLD, macular degeneration of bilateral eyes, OA, OAB, osteopenia, urge incontinence.  are also affecting patient's functional outcome.   REHAB POTENTIAL:  Good  CLINICAL DECISION MAKING: Evolving/moderate complexity  EVALUATION COMPLEXITY: Moderate  PLAN:  PT FREQUENCY: 2x/week  PT DURATION: 12 weeks  PLANNED INTERVENTIONS: Therapeutic exercises, Therapeutic activity, Neuromuscular re-education, Balance training, Gait training, Patient/Family education, Self Care, Joint mobilization, Stair training, Vestibular training, Canalith repositioning, Visual/preceptual remediation/compensation, DME instructions, Electrical stimulation, Cryotherapy, Moist heat, Manual lymph drainage, Splintting, Taping, Traction, Ultrasound, Manual therapy, and Re-evaluation  PLAN FOR NEXT SESSION:LE   Dynamic Balance core strength. Weighted gait.  Golden Pop PT  Physical Therapist - [place]  [facility]  10:11 AM 09/02/22

## 2022-09-06 NOTE — Therapy (Signed)
OUTPATIENT PHYSICAL THERAPY NEURO TREATMENT NOTE   Patient Name: Sierra Bentley MRN: [id number] DOB:1933/10/16, 89 y.o., female Today's Date: 09/07/2022   PCP: Judy Pimple MD REFERRING PROVIDER: Judy Pimple MD   END OF SESSION:  PT End of Session - 09/07/22 1313     Visit Number 16    Number of Visits 24    Date for PT Re-Evaluation 09/22/22    Authorization Type 6/10    PT Start Time 1315    PT Stop Time 1359    PT Time Calculation (min) 44 min    Equipment Utilized During Treatment Gait belt    Activity Tolerance Patient tolerated treatment well    Behavior During Therapy WFL for tasks assessed/performed                         Past Medical History:  Diagnosis Date   Allergic rhinitis, cause unspecified    Anemia    Breast cancer (HCC)    left breast IDC 2023   Chronically dry eyes, bilateral    Diverticulosis of colon    Endometrial polyp    Family history of prostate cancer 03/18/2021   Fuchs' corneal dystrophy of right eye    GERD (gastroesophageal reflux disease)    Hearing loss of both ears    pt stated has hearing aids but does not wear   History of epistaxis    per pt has had several cautization's for nose bleed   History of radiation therapy    05/20/21-06/10/21- Dr. Antony Blackbird   HTN (hypertension)    Hyperlipidemia    Macular degeneration of both eyes    followed @[facility]  by dr Demetrius Charity. mettu;   both age-related and right is with active choroidal neovascularization   OA (osteoarthritis)    OAB (overactive bladder)    Osteopenia    Thickened endometrium    Urge incontinence of urine    Wears glasses    Past Surgical History:  Procedure Laterality Date   BREAST BIOPSY Left 2011   fibrocystic change   BREAST LUMPECTOMY WITH RADIOACTIVE SEED LOCALIZATION Left 04/13/2021   Procedure: LEFT BREAST LUMPECTOMY WITH RADIOACTIVE SEED LOCALIZATION;  Surgeon: Emelia Loron, MD;  Location: [facility];  Service: General;   Laterality: Left;   CATARACT EXTRACTION W/ INTRAOCULAR LENS IMPLANT Bilateral 2008   COLONOSCOPY  2013   DILATATION & CURETTAGE/HYSTEROSCOPY WITH MYOSURE N/A 11/26/2020   Procedure: DILATATION & CURETTAGE/HYSTEROSCOPY WITH MYOSURE;  Surgeon: Genia Del, MD;  Location: [facility];  Service: Gynecology;  Laterality: N/A;   TONSILLECTOMY  1962   Patient Active Problem List   Diagnosis Date Noted   History of fall 05/20/2022   Sacral pain 05/20/2022   SOB (shortness of breath) 05/20/2022   Dysuria 05/20/2022   Grief reaction 06/14/2021   General weakness 06/12/2021   Current use of proton pump inhibitor 03/19/2021   Family history of prostate cancer 03/18/2021   Malignant neoplasm of upper-outer quadrant of left breast in female, estrogen receptor negative (HCC) 03/17/2021   Poor balance 05/07/2019   Exudative age-related macular degeneration, right eye, with active choroidal neovascularization (HCC) 09/04/2018   History of hip fracture 02/27/2018   Pedal edema 09/28/2017   Prediabetes 11/30/2016   At moderate risk for fall 09/05/2015   Cervical spondylosis without myelopathy 08/05/2015   Routine general medical examination at a health care facility 08/27/2014   Colon cancer screening 08/27/2014   Encounter for Medicare  annual wellness exam 07/25/2013   Varicosities of leg 05/23/2012   Dry eye syndrome 04/14/2012   Nonexudative senile macular degeneration of retina 04/14/2012   Fuchs' corneal dystrophy 04/14/2012   Pseudophakia 04/14/2012   Abnormal ultrasound of thyroid gland 03/01/2011   Leg cramps 05/27/2009   Hyperlipidemia 06/11/2008   Allergic rhinitis 03/29/2008   Essential hypertension, benign 12/26/2006   GERD 12/26/2006   Rheumatoid arthritis (HCC) 12/26/2006   Osteoporosis 12/26/2006   URINARY INCONTINENCE 12/26/2006    ONSET DATE: a few months per patient report   REFERRING DIAG: poor balance, history of fall, moderate risk for fall    THERAPY DIAG:  Unsteadiness on feet  Difficulty in walking, not elsewhere classified  Muscle weakness (generalized)  Rationale for Evaluation and Treatment: Rehabilitation  SUBJECTIVE:                                                                                                                                                                                             SUBJECTIVE STATEMENT:  Patient reports no falls or LOB. Reports it was raining when coming in.   Pt accompanied by: self  PERTINENT HISTORY:   Patient presents to physical therapy for balance and fall risk. Patient has been seen in the past at this clinic in 2021. PMH includes anemia, breast cancer 2023, diverticulosis of colon, Fuch's corneal dystrophy of R eye, GERD, bilateral hearing loss, HTN, HLD, macular degeneration of bilateral eyes, OA, OAB, osteopenia, urge incontinence. Patient had therapy a few months ago that was not helpful. Patient reports one fall about a month ago at home and another fall in [employer]; tripped on ramp and fell backwards.  Has dizziness with quick turns and leaning over.   PAIN:  Are you having pain?  Have headaches, affects balance None currently.  PRECAUTIONS: Fall  WEIGHT BEARING RESTRICTIONS: No  FALLS: Has patient fallen in last 6 months? Yes. Number of falls 2  LIVING ENVIRONMENT: Lives with: lives alone Lives in: House/apartment Stairs:  two levels, doesn't go upstairs, 4 stairs in garage, 2 in front, handrails can reach both Has following equipment at home: Quad cane small base and Walker - 2 wheeled  PLOF: Independent  PATIENT GOALS: to get stronger and more steady   OBJECTIVE:   COGNITION: Overall cognitive status: Within functional limits for tasks assessed   SENSATION: WFL  COORDINATION: Heel slide test: very challenging due to fatigue   POSTURE: rounded shoulders  Vitals: Seated: 113/64 Standing 110/68    LOWER EXTREMITY MMT:    MMT  Right Eval Left Eval  Hip flexion 3+ 3+  Hip extension  Hip abduction 3+ 3+  Hip adduction 3+ 3+  Hip internal rotation    Hip external rotation    Knee flexion 4- 4-  Knee extension 3+ 4-  Ankle dorsiflexion 4- 4-  Ankle plantarflexion 4- 4-  Ankle inversion    Ankle eversion    (Blank rows = not tested)   TRANSFERS: Assistive device utilized: Single point cane  Sit to stand: SBA and CGA Stand to sit: SBA Chair to chair: CGA   GAIT: Gait pattern: step through pattern, decreased stride length, and narrow BOS Distance walked: 50 ft  Assistive device utilized:  hurrycane Level of assistance: CGA Comments: patient is challenged with quick steps   FUNCTIONAL TESTS:  5 times sit to stand: 22.71 10 meter walk test: 16.47 seconds with hurrycane  Berg Balance Scale: 28/56  PATIENT SURVEYS:  FOTO 40  TODAY'S TREATMENT:                                                                                                                              DATE: 09/07/22   Pt reports no s/s of orthostatic hypotension    Neuro Re-ed:  Standing with CGA next to support surface:  Airex pad: static stand 30 seconds x 2 trials, noticeable trembling of ankles/LE's with fatigue and challenge to maintain stability Airex pad: horizontal head turns 30 seconds scanning room 10x ; cueing for arc of motion  Airex pad: vertical head turns 30 seconds, cueing for arc of motion, noticeable sway with upward gaze increasing demand on ankle righting reaction musculature Airex pad: one foot on 6" step one foot on airex pad, hold position for 30 seconds, switch legs, 2x each LE; Airex pad : dual task with visual scan and word game dual task x 8  minutes   ambulate in hallway: -horizontal head turns with cues for reading alphabet from cards 86 ftx 2 sets very challenging for visual   -"red light/green light" for sudden initiation/termination of ambulation with close CGA for carryover to natural  environment 2x 86 ft 2 near LOB   TherEx:  Sit to stand 10x ; x 2 sets 6" step toe taps 12x each LE 6" step seated toe taps speed taps 30 seconds x 2 trials   Throughout session, pt performed all sit<>stand transfers with distant supervision assist for safety.   Throughout session PT provided CGA for safety with all dynamic balance and standing tasks for improved safety and weight shift to correct poor COM control.    PATIENT EDUCATION: Education details: goals, POC Person educated: Patient Education method: Explanation, Demonstration, Tactile cues, and Verbal cues Education comprehension: verbalized understanding, returned demonstration, verbal cues required, and tactile cues required  HOME EXERCISE PROGRAM: Access Code: [email or web address] URL: [email or web address] Date: 07/14/2022 Prepared by: Precious Bard  Exercises - Seated Heel Toe Raises  - 1 x daily - 7 x weekly - 2 sets - 10 reps - 5 hold - Standing Tandem Balance with Counter  Support  - 1 x daily - 7 x weekly - 2 sets - 2 reps - 30 hold - Standing March with Counter Support  - 1 x daily - 7 x weekly - 2 sets - 10 reps - 5 hold - Ankle Inversion Eversion Towel Slide  - 1 x daily - 7 x weekly - 1 sets - 15 reps - 5 hold  Access Code: [email or web address] URL: [email or web address] Date: 08/10/2022 Prepared by: Precious Bard  Exercises - Dead Bug with Swiss Ball  - 1 x daily - 7 x weekly - 2 sets - 10 reps - 5 hold - Seated Abdominal Press into Swiss Ball  - 1 x daily - 7 x weekly - 2 sets - 10 reps - 5 hold - Supine Hamstring Curl on Swiss Ball  - 1 x daily - 7 x weekly - 2 sets - 10 reps - 5 hold  GOALS: Goals reviewed with patient? Yes  SHORT TERM GOALS: Target date: 07/28/2022    Patient will be independent in home exercise program to improve strength/mobility for better functional independence with ADLs Baseline: 6/5: 7/16: intermittent compliance  Goal status: Partially Met    LONG TERM GOALS:  Target date: 09/22/2022    Patient will increase FOTO score to equal to or greater than   47  to demonstrate statistically significant improvement in mobility and quality of life.  Baseline: 6/5: 40% 7/16: 56%  Goal status:MET  2. Patient (> 61 years old) will complete five times sit to stand test in < 15 seconds indicating an increased LE strength and improved balance. Baseline: 6/5: 22.71 seconds with BUE support 7/16: 21 with no hands Goal status: Partially Met   3.  Patient will increase 10 meter walk test to >1.41m/s as to improve gait speed for better community ambulation and to reduce fall risk. Baseline: 6/5: 16.47 seconds with hurrycane 7/16: 12 seconds no AD  Goal status: Partially Met   4.   Patient will demonstrate an improved Berg Balance Score of > 38 as to demonstrate improved balance with ADLs such as sitting/standing and transfer balance and reduced fall risk.  Baseline: 6/5: 28/56 7/16: 39/56  Goal status: MET    ASSESSMENT:  CLINICAL IMPRESSION:    Patient is challenged with head turns on airex pad. She is highly motivated throughout session. Patient is challenged with dual task while maintaining stability. Head turns with ambulation is challenging with increased scissor steps. Sudden termination of ambulation results with occasional instability requiring min A to remain upright. Patient will benefit from skilled physical therapy to increase strength, stability, and mobility for improved quality of life.    OBJECTIVE IMPAIRMENTS: Abnormal gait, decreased activity tolerance, decreased balance, decreased coordination, decreased endurance, decreased mobility, difficulty walking, decreased strength, dizziness, impaired perceived functional ability, impaired flexibility, impaired vision/preception, and improper body mechanics.   ACTIVITY LIMITATIONS: carrying, lifting, bending, standing, squatting, stairs, transfers, bed mobility, dressing, reach over head, locomotion level,  and caring for others  PARTICIPATION LIMITATIONS: meal prep, cleaning, laundry, driving, shopping, community activity, and yard work  PERSONAL FACTORS: anemia, breast cancer 2023, diverticulosis of colon, Fuch's corneal dystrophy of R eye, GERD, bilateral hearing loss, HTN, HLD, macular degeneration of bilateral eyes, OA, OAB, osteopenia, urge incontinence.  are also affecting patient's functional outcome.   REHAB POTENTIAL: Good  CLINICAL DECISION MAKING: Evolving/moderate complexity  EVALUATION COMPLEXITY: Moderate  PLAN:  PT FREQUENCY: 2x/week  PT DURATION: 12 weeks  PLANNED INTERVENTIONS: Therapeutic exercises, Therapeutic activity, Neuromuscular re-education, Balance  training, Gait training, Patient/Family education, Self Care, Joint mobilization, Stair training, Vestibular training, Canalith repositioning, Visual/preceptual remediation/compensation, DME instructions, Electrical stimulation, Cryotherapy, Moist heat, Manual lymph drainage, Splintting, Taping, Traction, Ultrasound, Manual therapy, and Re-evaluation  PLAN FOR NEXT SESSION:LE   Dynamic Balance core strength. Weighted gait.   Precious Bard PT  Physical Therapist - [facility]  2:07 PM 09/07/22

## 2022-09-07 ENCOUNTER — Ambulatory Visit: Payer: Medicare PPO

## 2022-09-07 DIAGNOSIS — M6281 Muscle weakness (generalized): Secondary | ICD-10-CM | POA: Diagnosis not present

## 2022-09-07 DIAGNOSIS — R262 Difficulty in walking, not elsewhere classified: Secondary | ICD-10-CM

## 2022-09-07 DIAGNOSIS — R2681 Unsteadiness on feet: Secondary | ICD-10-CM | POA: Diagnosis not present

## 2022-09-09 ENCOUNTER — Ambulatory Visit: Payer: Medicare PPO

## 2022-09-13 NOTE — Therapy (Signed)
OUTPATIENT PHYSICAL THERAPY NEURO TREATMENT NOTE   Patient Name: Sierra Bentley MRN: [id number] DOB:1933/09/21, 89 y.o., female Today's Date: 09/14/2022   PCP: Judy Pimple MD REFERRING PROVIDER: Judy Pimple MD   END OF SESSION:  PT End of Session - 09/14/22 1309     Visit Number 17    Number of Visits 24    Date for PT Re-Evaluation 09/22/22    Authorization Type 7/10    PT Start Time 1314    PT Stop Time 1359    PT Time Calculation (min) 45 min    Equipment Utilized During Treatment Gait belt    Activity Tolerance Patient tolerated treatment well    Behavior During Therapy WFL for tasks assessed/performed                          Past Medical History:  Diagnosis Date   Allergic rhinitis, cause unspecified    Anemia    Breast cancer (HCC)    left breast IDC 2023   Chronically dry eyes, bilateral    Diverticulosis of colon    Endometrial polyp    Family history of prostate cancer 03/18/2021   Fuchs' corneal dystrophy of right eye    GERD (gastroesophageal reflux disease)    Hearing loss of both ears    pt stated has hearing aids but does not wear   History of epistaxis    per pt has had several cautization's for nose bleed   History of radiation therapy    05/20/21-06/10/21- Dr. Antony Blackbird   HTN (hypertension)    Hyperlipidemia    Macular degeneration of both eyes    followed @[facility]  by dr Demetrius Charity. mettu;   both age-related and right is with active choroidal neovascularization   OA (osteoarthritis)    OAB (overactive bladder)    Osteopenia    Thickened endometrium    Urge incontinence of urine    Wears glasses    Past Surgical History:  Procedure Laterality Date   BREAST BIOPSY Left 2011   fibrocystic change   BREAST LUMPECTOMY WITH RADIOACTIVE SEED LOCALIZATION Left 04/13/2021   Procedure: LEFT BREAST LUMPECTOMY WITH RADIOACTIVE SEED LOCALIZATION;  Surgeon: Emelia Loron, MD;  Location: [facility];  Service: General;   Laterality: Left;   CATARACT EXTRACTION W/ INTRAOCULAR LENS IMPLANT Bilateral 2008   COLONOSCOPY  2013   DILATATION & CURETTAGE/HYSTEROSCOPY WITH MYOSURE N/A 11/26/2020   Procedure: DILATATION & CURETTAGE/HYSTEROSCOPY WITH MYOSURE;  Surgeon: Genia Del, MD;  Location: [facility];  Service: Gynecology;  Laterality: N/A;   TONSILLECTOMY  1962   Patient Active Problem List   Diagnosis Date Noted   History of fall 05/20/2022   Sacral pain 05/20/2022   SOB (shortness of breath) 05/20/2022   Dysuria 05/20/2022   Grief reaction 06/14/2021   General weakness 06/12/2021   Current use of proton pump inhibitor 03/19/2021   Family history of prostate cancer 03/18/2021   Malignant neoplasm of upper-outer quadrant of left breast in female, estrogen receptor negative (HCC) 03/17/2021   Poor balance 05/07/2019   Exudative age-related macular degeneration, right eye, with active choroidal neovascularization (HCC) 09/04/2018   History of hip fracture 02/27/2018   Pedal edema 09/28/2017   Prediabetes 11/30/2016   At moderate risk for fall 09/05/2015   Cervical spondylosis without myelopathy 08/05/2015   Routine general medical examination at a health care facility 08/27/2014   Colon cancer screening 08/27/2014   Encounter for  Medicare annual wellness exam 07/25/2013   Varicosities of leg 05/23/2012   Dry eye syndrome 04/14/2012   Nonexudative senile macular degeneration of retina 04/14/2012   Fuchs' corneal dystrophy 04/14/2012   Pseudophakia 04/14/2012   Abnormal ultrasound of thyroid gland 03/01/2011   Leg cramps 05/27/2009   Hyperlipidemia 06/11/2008   Allergic rhinitis 03/29/2008   Essential hypertension, benign 12/26/2006   GERD 12/26/2006   Rheumatoid arthritis (HCC) 12/26/2006   Osteoporosis 12/26/2006   URINARY INCONTINENCE 12/26/2006    ONSET DATE: a few months per patient report   REFERRING DIAG: poor balance, history of fall, moderate risk for fall    THERAPY DIAG:  Unsteadiness on feet  Difficulty in walking, not elsewhere classified  Muscle weakness (generalized)  Rationale for Evaluation and Treatment: Rehabilitation  SUBJECTIVE:                                                                                                                                                                                             SUBJECTIVE STATEMENT:  Patient reports she has not been compliant with HEP due to moving. Reports dizziness yesterday and over the weekend.   Pt accompanied by: self  PERTINENT HISTORY:   Patient presents to physical therapy for balance and fall risk. Patient has been seen in the past at this clinic in 2021. PMH includes anemia, breast cancer 2023, diverticulosis of colon, Fuch's corneal dystrophy of R eye, GERD, bilateral hearing loss, HTN, HLD, macular degeneration of bilateral eyes, OA, OAB, osteopenia, urge incontinence. Patient had therapy a few months ago that was not helpful. Patient reports one fall about a month ago at home and another fall in [employer]; tripped on ramp and fell backwards.  Has dizziness with quick turns and leaning over.   PAIN:  Are you having pain?  Have headaches, affects balance None currently.  PRECAUTIONS: Fall  WEIGHT BEARING RESTRICTIONS: No  FALLS: Has patient fallen in last 6 months? Yes. Number of falls 2  LIVING ENVIRONMENT: Lives with: lives alone Lives in: House/apartment Stairs:  two levels, doesn't go upstairs, 4 stairs in garage, 2 in front, handrails can reach both Has following equipment at home: Quad cane small base and Walker - 2 wheeled  PLOF: Independent  PATIENT GOALS: to get stronger and more steady   OBJECTIVE:   COGNITION: Overall cognitive status: Within functional limits for tasks assessed   SENSATION: WFL  COORDINATION: Heel slide test: very challenging due to fatigue   POSTURE: rounded shoulders  Vitals: Seated: 113/64 Standing 110/68     LOWER EXTREMITY MMT:    MMT Right Eval Left Eval  Hip  flexion 3+ 3+  Hip extension    Hip abduction 3+ 3+  Hip adduction 3+ 3+  Hip internal rotation    Hip external rotation    Knee flexion 4- 4-  Knee extension 3+ 4-  Ankle dorsiflexion 4- 4-  Ankle plantarflexion 4- 4-  Ankle inversion    Ankle eversion    (Blank rows = not tested)   TRANSFERS: Assistive device utilized: Single point cane  Sit to stand: SBA and CGA Stand to sit: SBA Chair to chair: CGA   GAIT: Gait pattern: step through pattern, decreased stride length, and narrow BOS Distance walked: 50 ft  Assistive device utilized:  hurrycane Level of assistance: CGA Comments: patient is challenged with quick steps   FUNCTIONAL TESTS:  5 times sit to stand: 22.71 10 meter walk test: 16.47 seconds with hurrycane  Berg Balance Scale: 28/56  PATIENT SURVEYS:  FOTO 40  TODAY'S TREATMENT:                                                                                                                              DATE: 09/14/22   BP:  Seated: 134/68 Standing 136/75   Neuro Re-ed: ambulate across stable and unstable surface outside. Negotiating changing surfaces from grass to sidewalk, across brick with turns and obstacles in pathway without LOB.Cueing for vision required. Multiple cues for heel strike required.   Standing with CGA next to support surface:  Airex pad: static stand 30 seconds x 2 trials, noticeable trembling of ankles/LE's with fatigue and challenge to maintain stability Airex pad: horizontal head turns 30 seconds scanning room 10x ; cueing for arc of motion  Airex pad: vertical head turns 30 seconds, cueing for arc of motion, noticeable sway with upward gaze increasing demand on ankle righting reaction musculature  ambulate in hallway: -self ball toss 150 ft  x2 trials ; occasional instability.  -"red light/green light" for sudden initiation/termination of ambulation with close CGA for  carryover to natural environment 2x 86 ft 2 near LOB   TherEx:  Sit to stand 10x ; x 2 sets 6" step seated toe taps 30 seconds x 2 trials  Throughout session, pt performed all sit<>stand transfers with distant supervision assist for safety.    PATIENT EDUCATION: Education details: goals, POC Person educated: Patient Education method: Explanation, Demonstration, Tactile cues, and Verbal cues Education comprehension: verbalized understanding, returned demonstration, verbal cues required, and tactile cues required  HOME EXERCISE PROGRAM: Access Code: [email or web address] URL: [email or web address] Date: 07/14/2022 Prepared by: Precious Bard  Exercises - Seated Heel Toe Raises  - 1 x daily - 7 x weekly - 2 sets - 10 reps - 5 hold - Standing Tandem Balance with Counter Support  - 1 x daily - 7 x weekly - 2 sets - 2 reps - 30 hold - Standing March with Counter Support  - 1 x daily - 7 x weekly - 2 sets - 10 reps - 5 hold -  Ankle Inversion Eversion Towel Slide  - 1 x daily - 7 x weekly - 1 sets - 15 reps - 5 hold  Access Code: [email or web address] URL: [email or web address] Date: 08/10/2022 Prepared by: Precious Bard  Exercises - Dead Bug with Swiss Ball  - 1 x daily - 7 x weekly - 2 sets - 10 reps - 5 hold - Seated Abdominal Press into Swiss Ball  - 1 x daily - 7 x weekly - 2 sets - 10 reps - 5 hold - Supine Hamstring Curl on Swiss Ball  - 1 x daily - 7 x weekly - 2 sets - 10 reps - 5 hold  GOALS: Goals reviewed with patient? Yes  SHORT TERM GOALS: Target date: 07/28/2022    Patient will be independent in home exercise program to improve strength/mobility for better functional independence with ADLs Baseline: 6/5: 7/16: intermittent compliance  Goal status: Partially Met    LONG TERM GOALS: Target date: 09/22/2022    Patient will increase FOTO score to equal to or greater than   47  to demonstrate statistically significant improvement in mobility and quality of life.   Baseline: 6/5: 40% 7/16: 56%  Goal status:MET  2. Patient (> 58 years old) will complete five times sit to stand test in < 15 seconds indicating an increased LE strength and improved balance. Baseline: 6/5: 22.71 seconds with BUE support 7/16: 21 with no hands Goal status: Partially Met   3.  Patient will increase 10 meter walk test to >1.14m/s as to improve gait speed for better community ambulation and to reduce fall risk. Baseline: 6/5: 16.47 seconds with hurrycane 7/16: 12 seconds no AD  Goal status: Partially Met   4.   Patient will demonstrate an improved Berg Balance Score of > 38 as to demonstrate improved balance with ADLs such as sitting/standing and transfer balance and reduced fall risk.  Baseline: 6/5: 28/56 7/16: 39/56  Goal status: MET    ASSESSMENT:  CLINICAL IMPRESSION:  Due to patient's dizziness over the weekend her vitals are monitored throughout the session. Patient requires multiple cues for heel strike throughout ambulation as she fatigues. When she turns her head she loses her balance with ambulation indicating continued need for focus.  Patient will benefit from skilled physical therapy to increase strength, stability, and mobility for improved quality of life.    OBJECTIVE IMPAIRMENTS: Abnormal gait, decreased activity tolerance, decreased balance, decreased coordination, decreased endurance, decreased mobility, difficulty walking, decreased strength, dizziness, impaired perceived functional ability, impaired flexibility, impaired vision/preception, and improper body mechanics.   ACTIVITY LIMITATIONS: carrying, lifting, bending, standing, squatting, stairs, transfers, bed mobility, dressing, reach over head, locomotion level, and caring for others  PARTICIPATION LIMITATIONS: meal prep, cleaning, laundry, driving, shopping, community activity, and yard work  PERSONAL FACTORS: anemia, breast cancer 2023, diverticulosis of colon, Fuch's corneal dystrophy of R eye,  GERD, bilateral hearing loss, HTN, HLD, macular degeneration of bilateral eyes, OA, OAB, osteopenia, urge incontinence.  are also affecting patient's functional outcome.   REHAB POTENTIAL: Good  CLINICAL DECISION MAKING: Evolving/moderate complexity  EVALUATION COMPLEXITY: Moderate  PLAN:  PT FREQUENCY: 2x/week  PT DURATION: 12 weeks  PLANNED INTERVENTIONS: Therapeutic exercises, Therapeutic activity, Neuromuscular re-education, Balance training, Gait training, Patient/Family education, Self Care, Joint mobilization, Stair training, Vestibular training, Canalith repositioning, Visual/preceptual remediation/compensation, DME instructions, Electrical stimulation, Cryotherapy, Moist heat, Manual lymph drainage, Splintting, Taping, Traction, Ultrasound, Manual therapy, and Re-evaluation  PLAN FOR NEXT SESSION:LE   Dynamic Balance core strength. Weighted gait.   [place]  Maple [place] PT  Physical Therapist - [facility]  1:59 PM 09/14/22

## 2022-09-14 ENCOUNTER — Ambulatory Visit: Payer: Medicare PPO

## 2022-09-14 DIAGNOSIS — M6281 Muscle weakness (generalized): Secondary | ICD-10-CM | POA: Diagnosis not present

## 2022-09-14 DIAGNOSIS — R2681 Unsteadiness on feet: Secondary | ICD-10-CM | POA: Diagnosis not present

## 2022-09-14 DIAGNOSIS — R262 Difficulty in walking, not elsewhere classified: Secondary | ICD-10-CM

## 2022-09-15 ENCOUNTER — Encounter: Payer: Self-pay | Admitting: Family Medicine

## 2022-09-15 ENCOUNTER — Ambulatory Visit: Payer: Medicare PPO | Admitting: Family Medicine

## 2022-09-15 ENCOUNTER — Ambulatory Visit (INDEPENDENT_AMBULATORY_CARE_PROVIDER_SITE_OTHER)
Admission: RE | Admit: 2022-09-15 | Discharge: 2022-09-15 | Disposition: A | Discharge status: Home / Self Care | Payer: Medicare PPO | Source: Ambulatory Visit | Attending: Family Medicine | Admitting: Family Medicine

## 2022-09-15 VITALS — BP 112/66 | HR 83 | Temp 98.2°F | Ht 63.25 in | Wt 118.2 lb

## 2022-09-15 DIAGNOSIS — I10 Essential (primary) hypertension: Secondary | ICD-10-CM

## 2022-09-15 DIAGNOSIS — R5382 Chronic fatigue, unspecified: Secondary | ICD-10-CM | POA: Diagnosis not present

## 2022-09-15 DIAGNOSIS — J439 Emphysema, unspecified: Secondary | ICD-10-CM | POA: Diagnosis not present

## 2022-09-15 DIAGNOSIS — R5383 Other fatigue: Secondary | ICD-10-CM | POA: Insufficient documentation

## 2022-09-15 DIAGNOSIS — R0602 Shortness of breath: Secondary | ICD-10-CM

## 2022-09-15 NOTE — Patient Instructions (Signed)
Labs today   EKG today   Chest xray today   We will make a follow up plan when we get results   If symptoms suddenly worsen in the meantime let me know   Take care of yourself  Eat frequent small meals with protein

## 2022-09-15 NOTE — Assessment & Plan Note (Signed)
bp in fair control at this time  BP Readings from Last 1 Encounters:  09/15/22 112/66   No changes needed Most recent labs reviewed  Disc lifstyle change with low sodium diet and exercise   Continues losartan 50-12.5 mg half pill daily  Labs today

## 2022-09-15 NOTE — Assessment & Plan Note (Addendum)
1 month  No wheeze/cough /chest pain or tightness No PND or orthopnea  Normal exam Normal EKG  Cxr : notes some changes of emphysema (surprising) - may need pulm follow up or PFTs   Labs for this and fatigue drawn Pending results for plan

## 2022-09-15 NOTE — Assessment & Plan Note (Signed)
Reassuring exam May be multifactorial  Also some shortness of breath with exertion  Is in PT for leg strength and that is going well  More stressors lately with house issues   Lab today  EKG wnl CXR Labs  Plan with results

## 2022-09-15 NOTE — Progress Notes (Signed)
Subjective:    Patient ID: Sierra Bentley, female    DOB: [birth year], 89 y.o.   MRN: [id number]  HPI  Wt Readings from Last 3 Encounters:  09/15/22 118 lb 4 oz (53.6 kg)  05/20/22 118 lb 2 oz (53.6 kg)  04/20/22 118 lb 4 oz (53.6 kg)   20.78 kg/m  Vitals:   09/15/22 1523  BP: 112/66  Pulse: 83  Temp: 98.2 F (36.8 C)  SpO2: 96%   Pt presents with fatigue Some shortness of breath with exertion  Headache   More out of breath for about a month  Mainly if exerting herself /walking / standing - active  Gets tired as well  Gets better when she rests   No chest pain or pressure  No wheezing  No chest tightness   Has chronic ST- went to ENT and did not find anything   Headaches- for years  Has been to ENT in past -told is allergy and sinus related /not migraine   Still doing PT for legs Feels stronger overall   Stress- a lot  Had a water leak Had repairs and floors replaced  Moved to [place] [place] [address] - considering staying there but it is not home  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 The heart size and mediastinal contours are within normal limits. Both lungs are clear. The visualized skeletal structures are unremarkable. IMPRESSION: 1. No active cardiopulmonary disease. 2. Emphysema. Electronically Signed   By: Darliss Cheney M.D.   On: 09/15/2022 17:24       Lab Results  Component Value Date   NA 140 04/13/2022   K 4.1 04/13/2022   CO2 31 04/13/2022   GLUCOSE 88 04/13/2022   BUN 14 04/13/2022   CREATININE 0.64 04/13/2022   CALCIUM 9.9 04/13/2022   GFR 80.08 04/13/2022   GFRNONAA >60 04/03/2021   Lab Results  Component Value Date   ALT 15  04/13/2022   AST 20 04/13/2022   ALKPHOS 56 04/13/2022   BILITOT 0.5 04/13/2022   Lab Results  Component Value Date   WBC 4.9 04/13/2022   HGB 13.3 04/13/2022   HCT 40.3 04/13/2022   MCV 85.4 04/13/2022   PLT 240.0 04/13/2022   Lab Results  Component Value Date   TSH 2.33 04/13/2022   Lab Results  Component Value Date   VITAMINB12 824 04/13/2022   Last vitamin D Lab Results  Component Value Date   VD25OH 35.24 04/13/2022      Patient Active Problem List   Diagnosis Date Noted   Fatigue 09/15/2022   History of fall 05/20/2022   Sacral pain 05/20/2022   SOB (shortness of breath) on exertion 05/20/2022   Dysuria 05/20/2022   Grief reaction 06/14/2021   General weakness 06/12/2021   Current use of proton pump inhibitor 03/19/2021   Family history of prostate cancer 03/18/2021   Malignant neoplasm of upper-outer quadrant of left breast in female, estrogen receptor negative (HCC) 03/17/2021   Poor balance 05/07/2019   Exudative age-related macular degeneration, right eye, with active choroidal neovascularization (HCC) 09/04/2018   History of hip fracture 02/27/2018   Pedal edema 09/28/2017   Prediabetes 11/30/2016  At moderate risk for fall 09/05/2015   Cervical spondylosis without myelopathy 08/05/2015   Routine general medical examination at a health care facility 08/27/2014   Colon cancer screening 08/27/2014   Encounter for Medicare annual wellness exam 07/25/2013   Varicosities of leg 05/23/2012   Dry eye syndrome 04/14/2012   Nonexudative senile macular degeneration of retina 04/14/2012   Fuchs' corneal dystrophy 04/14/2012   Pseudophakia 04/14/2012   Abnormal ultrasound of thyroid gland 03/01/2011   Leg cramps 05/27/2009   Hyperlipidemia 06/11/2008   Allergic rhinitis 03/29/2008   Essential hypertension, benign 12/26/2006   GERD 12/26/2006   Rheumatoid arthritis (HCC) 12/26/2006   Osteoporosis 12/26/2006   URINARY INCONTINENCE 12/26/2006   Past  Medical History:  Diagnosis Date   Allergic rhinitis, cause unspecified    Anemia    Breast cancer (HCC)    left breast IDC 2023   Chronically dry eyes, bilateral    Diverticulosis of colon    Endometrial polyp    Family history of prostate cancer 03/18/2021   Fuchs' corneal dystrophy of right eye    GERD (gastroesophageal reflux disease)    Hearing loss of both ears    pt stated has hearing aids but does not wear   History of epistaxis    per pt has had several cautization's for nose bleed   History of radiation therapy    05/20/21-06/10/21- Dr. Antony Blackbird   HTN (hypertension)    Hyperlipidemia    Macular degeneration of both eyes    followed @[facility]  by dr Demetrius Charity. mettu;   both age-related and right is with active choroidal neovascularization   OA (osteoarthritis)    OAB (overactive bladder)    Osteopenia    Thickened endometrium    Urge incontinence of urine    Wears glasses    Past Surgical History:  Procedure Laterality Date   BREAST BIOPSY Left 2011   fibrocystic change   BREAST LUMPECTOMY WITH RADIOACTIVE SEED LOCALIZATION Left 04/13/2021   Procedure: LEFT BREAST LUMPECTOMY WITH RADIOACTIVE SEED LOCALIZATION;  Surgeon: Emelia Loron, MD;  Location: [facility];  Service: General;  Laterality: Left;   CATARACT EXTRACTION W/ INTRAOCULAR LENS IMPLANT Bilateral 2008   COLONOSCOPY  2013   DILATATION & CURETTAGE/HYSTEROSCOPY WITH MYOSURE N/A 11/26/2020   Procedure: DILATATION & CURETTAGE/HYSTEROSCOPY WITH MYOSURE;  Surgeon: Genia Del, MD;  Location: [facility];  Service: Gynecology;  Laterality: N/A;   TONSILLECTOMY  1962   Social History   Tobacco Use   Smoking status: Never    Passive exposure: Never   Smokeless tobacco: Never  Vaping Use   Vaping status: Never Used  Substance Use Topics   Alcohol use: No    Alcohol/week: 0.0 standard drinks of alcohol   Drug use: Never   Family History  Problem Relation Age of Onset    Other Mother        Arrythmia   Coronary artery disease Mother    Hypertension Mother    Anxiety disorder Mother    Heart attack Father 13   Coronary artery disease Father    Prostate cancer Father        dx unknown age   Throat cancer Brother        d. 35   Cancer Other        two maternal female cousins; unknown cancer; dx after 33   Depression Other        family history   Allergies  Allergen Reactions   Ace  Inhibitors Cough   Boniva [Ibandronic Acid]     Esophageal distress   Chocolate Other (See Comments)    Severe headache and stopped up sinuses   Fosamax [Alendronate]     Esophageal distress   Oxycodone Other (See Comments)    Dizziness resulting in fall    Current Outpatient Medications on File Prior to Visit  Medication Sig Dispense Refill   Carboxymethylcellulose Sodium (THERATEARS) 0.25 % SOLN Apply 1 drop to eye daily as needed.     latanoprost (XALATAN) 0.005 % ophthalmic solution 1 drop at bedtime.     losartan-hydrochlorothiazide (HYZAAR) 50-12.5 MG tablet Take 0.5 tablets by mouth every evening. 45 tablet 3   Multiple Vitamin (MULTIVITAMIN) tablet Take 1 tablet by mouth daily. Per pt has Postmenopausal multivitamin packet with 5 pills     pantoprazole (PROTONIX) 20 MG tablet Take 1 tablet (20 mg total) by mouth daily. 90 tablet 3   Polyethyl Glycol-Propyl Glycol (SYSTANE ULTRA OP) Place 1 drop into both eyes 4 (four) times daily.     Probiotic Product (ALIGN EXTRA STRENGTH PO) Take by mouth.     sodium chloride (MURO 128) 5 % ophthalmic ointment Place 1 application into both eyes at bedtime.      sodium chloride (MURO 128) 5 % ophthalmic solution 1 drop as needed for eye irritation.     TURMERIC PO Take 1,300 mg by mouth in the morning and at bedtime.     No current facility-administered medications on file prior to visit.    Review of Systems  Constitutional:  Positive for fatigue. Negative for activity change, appetite change, fever and unexpected  weight change.  HENT:  Negative for congestion, ear pain, rhinorrhea, sinus pressure and sore throat.   Eyes:  Negative for pain, redness and visual disturbance.  Respiratory:  Positive for shortness of breath. Negative for apnea, cough, choking, chest tightness, wheezing and stridor.   Cardiovascular:  Negative for chest pain, palpitations and leg swelling.  Gastrointestinal:  Negative for abdominal pain, blood in stool, constipation and diarrhea.  Endocrine: Negative for polydipsia and polyuria.  Genitourinary:  Negative for dysuria, frequency and urgency.  Musculoskeletal:  Negative for arthralgias, back pain and myalgias.  Skin:  Negative for pallor and rash.  Allergic/Immunologic: Negative for environmental allergies.  Neurological:  Positive for light-headedness and headaches. Negative for dizziness, tremors, seizures, syncope, speech difficulty, weakness and numbness.       Occational feels dizzy  Hematological:  Negative for adenopathy. Does not bruise/bleed easily.  Psychiatric/Behavioral:  Negative for decreased concentration and dysphoric mood. The patient is not nervous/anxious.        Objective:   Physical Exam Constitutional:      General: She is not in acute distress.    Appearance: She is well-developed and normal weight. She is not ill-appearing or diaphoretic.  HENT:     Head: Normocephalic and atraumatic.     Mouth/Throat:     Mouth: Mucous membranes are moist.  Eyes:     Conjunctiva/sclera: Conjunctivae normal.     Pupils: Pupils are equal, round, and reactive to light.  Neck:     Thyroid: No thyromegaly.     Vascular: No carotid bruit or JVD.  Cardiovascular:     Rate and Rhythm: Normal rate and regular rhythm.     Heart sounds: Normal heart sounds.     No gallop.  Pulmonary:     Effort: Pulmonary effort is normal. No respiratory distress.     Breath sounds: Normal  breath sounds. No stridor. No wheezing, rhonchi or rales.     Comments: No crackles  Chest:      Chest wall: No tenderness.  Abdominal:     General: There is no distension or abdominal bruit.     Palpations: Abdomen is soft. There is no mass.     Tenderness: There is no abdominal tenderness. There is no right CVA tenderness, left CVA tenderness, guarding or rebound.     Hernia: No hernia is present.  Musculoskeletal:     Cervical back: Normal range of motion and neck supple.     Right lower leg: No edema.     Left lower leg: No edema.  Lymphadenopathy:     Cervical: No cervical adenopathy.  Skin:    General: Skin is warm and dry.     Coloration: Skin is not jaundiced or pale.     Findings: No bruising or rash.  Neurological:     Mental Status: She is alert.     Coordination: Coordination normal.     Deep Tendon Reflexes: Reflexes are normal and symmetric. Reflexes normal.  Psychiatric:        Mood and Affect: Mood normal.     Comments: Cognitively sharp           Assessment & Plan:   Problem List Items Addressed This Visit       Cardiovascular and Mediastinum   Essential hypertension, benign    bp in fair control at this time  BP Readings from Last 1 Encounters:  09/15/22 112/66   No changes needed Most recent labs reviewed  Disc lifstyle change with low sodium diet and exercise   Continues losartan 50-12.5 mg half pill daily  Labs today      Relevant Orders   EKG 12-Lead (Completed)   DG Chest 2 View (Completed)     Other   Fatigue    Reassuring exam May be multifactorial  Also some shortness of breath with exertion  Is in PT for leg strength and that is going well  More stressors lately with house issues   Lab today  EKG wnl CXR Labs  Plan with results       Relevant Orders   VITAMIN D 25 Hydroxy (Vit-D Deficiency, Fractures)   T4, free   Iron   CBC with Differential/Platelet   Vitamin B12   TSH   Comprehensive metabolic panel   SOB (shortness of breath) on exertion - Primary    1 month  No wheeze/cough /chest pain or  tightness No PND or orthopnea  Normal exam Normal EKG  Cxr : notes some changes of emphysema (surprising) - may need pulm follow up or PFTs   Labs for this and fatigue drawn Pending results for plan      Relevant Orders   EKG 12-Lead (Completed)   DG Chest 2 View (Completed)   Iron   CBC with Differential/Platelet   TSH   Comprehensive metabolic panel   Brain natriuretic peptide

## 2022-09-16 ENCOUNTER — Ambulatory Visit: Payer: Medicare PPO | Admitting: Physical Therapy

## 2022-09-16 DIAGNOSIS — R262 Difficulty in walking, not elsewhere classified: Secondary | ICD-10-CM | POA: Diagnosis not present

## 2022-09-16 DIAGNOSIS — M6281 Muscle weakness (generalized): Secondary | ICD-10-CM

## 2022-09-16 DIAGNOSIS — R2681 Unsteadiness on feet: Secondary | ICD-10-CM | POA: Diagnosis not present

## 2022-09-16 LAB — CBC WITH DIFFERENTIAL/PLATELET
Basophils Absolute: 0 10*3/uL (ref 0.0–0.1)
Basophils Relative: 0.8 % (ref 0.0–3.0)
Eosinophils Absolute: 0.1 10*3/uL (ref 0.0–0.7)
Eosinophils Relative: 2.1 % (ref 0.0–5.0)
HCT: 39.6 % (ref 36.0–46.0)
Hemoglobin: 12.7 g/dL (ref 12.0–15.0)
Lymphocytes Relative: 18.7 % (ref 12.0–46.0)
Lymphs Abs: 1 10*3/uL (ref 0.7–4.0)
MCHC: 32.2 g/dL (ref 30.0–36.0)
MCV: 86.7 fl (ref 78.0–100.0)
Monocytes Absolute: 0.5 10*3/uL (ref 0.1–1.0)
Monocytes Relative: 8.8 % (ref 3.0–12.0)
Neutro Abs: 3.8 10*3/uL (ref 1.4–7.7)
Neutrophils Relative %: 69.6 % (ref 43.0–77.0)
Platelets: 245 10*3/uL (ref 150.0–400.0)
RBC: 4.56 Mil/uL (ref 3.87–5.11)
RDW: 14.4 % (ref 11.5–15.5)
WBC: 5.5 10*3/uL (ref 4.0–10.5)

## 2022-09-16 LAB — COMPREHENSIVE METABOLIC PANEL
ALT: 15 U/L (ref 0–35)
AST: 16 U/L (ref 0–37)
Albumin: 4.2 g/dL (ref 3.5–5.2)
Alkaline Phosphatase: 43 U/L (ref 39–117)
BUN: 15 mg/dL (ref 6–23)
CO2: 32 meq/L (ref 19–32)
Calcium: 9.5 mg/dL (ref 8.4–10.5)
Chloride: 100 meq/L (ref 96–112)
Creatinine, Ser: 0.73 mg/dL (ref 0.40–1.20)
GFR: 74.3 mL/min (ref >60.00)
Glucose, Bld: 110 mg/dL — ABNORMAL HIGH (ref 70–99)
Potassium: 4.1 meq/L (ref 3.5–5.1)
Sodium: 140 meq/L (ref 135–145)
Total Bilirubin: 0.3 mg/dL (ref 0.2–1.2)
Total Protein: 6.5 g/dL (ref 6.0–8.3)

## 2022-09-16 LAB — IRON: Iron: 80 ug/dL (ref 42–145)

## 2022-09-16 NOTE — Therapy (Signed)
OUTPATIENT PHYSICAL THERAPY NEURO TREATMENT NOTE   Patient Name: Sierra Bentley MRN: [id number] DOB:[birth year], 89 y.o., female Today's Date: 09/16/2022   PCP: Judy Pimple MD REFERRING PROVIDER: Judy Pimple MD   END OF SESSION:  PT End of Session - 09/16/22 1426     Visit Number 18    Number of Visits 24    Date for PT Re-Evaluation 09/22/22    Authorization Type 7/10    PT Start Time 1410    PT Stop Time 1445    PT Time Calculation (min) 35 min    Equipment Utilized During Treatment Gait belt    Activity Tolerance Patient tolerated treatment well    Behavior During Therapy WFL for tasks assessed/performed                          Past Medical History:  Diagnosis Date   Allergic rhinitis, cause unspecified    Anemia    Breast cancer (HCC)    left breast IDC 2023   Chronically dry eyes, bilateral    Diverticulosis of colon    Endometrial polyp    Family history of prostate cancer 03/18/2021   Fuchs' corneal dystrophy of right eye    GERD (gastroesophageal reflux disease)    Hearing loss of both ears    pt stated has hearing aids but does not wear   History of epistaxis    per pt has had several cautization's for nose bleed   History of radiation therapy    05/20/21-06/10/21- Dr. Antony Blackbird   HTN (hypertension)    Hyperlipidemia    Macular degeneration of both eyes    followed @[facility]  by dr Demetrius Charity. mettu;   both age-related and right is with active choroidal neovascularization   OA (osteoarthritis)    OAB (overactive bladder)    Osteopenia    Thickened endometrium    Urge incontinence of urine    Wears glasses    Past Surgical History:  Procedure Laterality Date   BREAST BIOPSY Left 2011   fibrocystic change   BREAST LUMPECTOMY WITH RADIOACTIVE SEED LOCALIZATION Left 04/13/2021   Procedure: LEFT BREAST LUMPECTOMY WITH RADIOACTIVE SEED LOCALIZATION;  Surgeon: Emelia Loron, MD;  Location: [facility];  Service: General;   Laterality: Left;   CATARACT EXTRACTION W/ INTRAOCULAR LENS IMPLANT Bilateral 2008   COLONOSCOPY  2013   DILATATION & CURETTAGE/HYSTEROSCOPY WITH MYOSURE N/A 11/26/2020   Procedure: DILATATION & CURETTAGE/HYSTEROSCOPY WITH MYOSURE;  Surgeon: Genia Del, MD;  Location: [facility];  Service: Gynecology;  Laterality: N/A;   TONSILLECTOMY  1962   Patient Active Problem List   Diagnosis Date Noted   Fatigue 09/15/2022   History of fall 05/20/2022   Sacral pain 05/20/2022   SOB (shortness of breath) on exertion 05/20/2022   Dysuria 05/20/2022   Grief reaction 06/14/2021   General weakness 06/12/2021   Current use of proton pump inhibitor 03/19/2021   Family history of prostate cancer 03/18/2021   Malignant neoplasm of upper-outer quadrant of left breast in female, estrogen receptor negative (HCC) 03/17/2021   Poor balance 05/07/2019   Exudative age-related macular degeneration, right eye, with active choroidal neovascularization (HCC) 09/04/2018   History of hip fracture 02/27/2018   Pedal edema 09/28/2017   Prediabetes 11/30/2016   At moderate risk for fall 09/05/2015   Cervical spondylosis without myelopathy 08/05/2015   Routine general medical examination at a health care facility 08/27/2014   Colon cancer  screening 08/27/2014   Encounter for Medicare annual wellness exam 07/25/2013   Varicosities of leg 05/23/2012   Dry eye syndrome 04/14/2012   Nonexudative senile macular degeneration of retina 04/14/2012   Fuchs' corneal dystrophy 04/14/2012   Pseudophakia 04/14/2012   Abnormal ultrasound of thyroid gland 03/01/2011   Leg cramps 05/27/2009   Hyperlipidemia 06/11/2008   Allergic rhinitis 03/29/2008   Essential hypertension, benign 12/26/2006   GERD 12/26/2006   Rheumatoid arthritis (HCC) 12/26/2006   Osteoporosis 12/26/2006   URINARY INCONTINENCE 12/26/2006    ONSET DATE: a few months per patient report   REFERRING DIAG: poor balance, history of  fall, moderate risk for fall   THERAPY DIAG:  Unsteadiness on feet  Difficulty in walking, not elsewhere classified  Muscle weakness (generalized)  Rationale for Evaluation and Treatment: Rehabilitation  SUBJECTIVE:                                                                                                                                                                                             SUBJECTIVE STATEMENT:  Patient reports that move is going well. Went to [place] due to to prolonged SOB with activity, has not heard results from xray or labs. Arrived late to PT on this day  Pt accompanied by: self  PERTINENT HISTORY:   Patient presents to physical therapy for balance and fall risk. Patient has been seen in the past at this clinic in 2021. PMH includes anemia, breast cancer 2023, diverticulosis of colon, Fuch's corneal dystrophy of R eye, GERD, bilateral hearing loss, HTN, HLD, macular degeneration of bilateral eyes, OA, OAB, osteopenia, urge incontinence. Patient had therapy a few months ago that was not helpful. Patient reports one fall about a month ago at home and another fall in [employer]; tripped on ramp and fell backwards.  Has dizziness with quick turns and leaning over.   PAIN:  Are you having pain?  Have headaches, affects balance None currently.  PRECAUTIONS: Fall  WEIGHT BEARING RESTRICTIONS: No  FALLS: Has patient fallen in last 6 months? Yes. Number of falls 2  LIVING ENVIRONMENT: Lives with: lives alone Lives in: House/apartment Stairs:  two levels, doesn't go upstairs, 4 stairs in garage, 2 in front, handrails can reach both Has following equipment at home: Quad cane small base and Walker - 2 wheeled  PLOF: Independent  PATIENT GOALS: to get stronger and more steady   OBJECTIVE:   COGNITION: Overall cognitive status: Within functional limits for tasks assessed   SENSATION: WFL  COORDINATION: Heel slide test: very challenging due to  fatigue   POSTURE: rounded shoulders  Vitals: Seated: 113/64  Standing 110/68    LOWER EXTREMITY MMT:    MMT Right Eval Left Eval  Hip flexion 3+ 3+  Hip extension    Hip abduction 3+ 3+  Hip adduction 3+ 3+  Hip internal rotation    Hip external rotation    Knee flexion 4- 4-  Knee extension 3+ 4-  Ankle dorsiflexion 4- 4-  Ankle plantarflexion 4- 4-  Ankle inversion    Ankle eversion    (Blank rows = not tested)   TRANSFERS: Assistive device utilized: Single point cane  Sit to stand: SBA and CGA Stand to sit: SBA Chair to chair: CGA   GAIT: Gait pattern: step through pattern, decreased stride length, and narrow BOS Distance walked: 50 ft  Assistive device utilized:  hurrycane Level of assistance: CGA Comments: patient is challenged with quick steps   FUNCTIONAL TESTS:  5 times sit to stand: 22.71 10 meter walk test: 16.47 seconds with hurrycane  Berg Balance Scale: 28/56  PATIENT SURVEYS:  FOTO 40  TODAY'S TREATMENT:                                                                                                                              DATE: 09/16/22   Education on limited visit count through insurance for 7 additional visits, may want to decrease to 1once per week if unable to obtain additional visits.    Gait without AD x 363ft with 2-3 mild lateral LOB with staggering step, requiring min assist from PT to correct lateral LOB  Foot tap on 4 inch step 2 x 10 bil with CGA-min assist from PT for proper weight shift and multiple instruction to reduce speed of movement to improve proper core activation and weight shift to prevent lateral LOB.  Standing with 1 LE on 4inch step 2 x 20 sce with min assist from PT and cues for use of hip strategy to prevent lateral and posterior LOB.  Lateral foot tap on 4 inch step 2 x 12 bil with min assist from PT to prevent lateral LOB    PATIENT EDUCATION: Education details: goals, POC Pt educated throughout  session about proper posture and technique with exercises. Improved exercise technique, movement at target joints, use of target muscles after min to mod verbal, visual, tactile cues.  Person educated: Patient Education method: Explanation, Demonstration, Tactile cues, and Verbal cues Education comprehension: verbalized understanding, returned demonstration, verbal cues required, and tactile cues required  HOME EXERCISE PROGRAM: Access Code: [email or web address] URL: [email or web address] Date: 07/14/2022 Prepared by: Precious Bard  Exercises - Seated Heel Toe Raises  - 1 x daily - 7 x weekly - 2 sets - 10 reps - 5 hold - Standing Tandem Balance with Counter Support  - 1 x daily - 7 x weekly - 2 sets - 2 reps - 30 hold - Standing March with Counter Support  - 1 x daily - 7 x weekly - 2 sets - 10 reps - 5  hold - Ankle Inversion Eversion Towel Slide  - 1 x daily - 7 x weekly - 1 sets - 15 reps - 5 hold  Access Code: [email or web address] URL: [email or web address] Date: 08/10/2022 Prepared by: Precious Bard  Exercises - Dead Bug with Swiss Ball  - 1 x daily - 7 x weekly - 2 sets - 10 reps - 5 hold - Seated Abdominal Press into Swiss Ball  - 1 x daily - 7 x weekly - 2 sets - 10 reps - 5 hold - Supine Hamstring Curl on Swiss Ball  - 1 x daily - 7 x weekly - 2 sets - 10 reps - 5 hold  GOALS: Goals reviewed with patient? Yes  SHORT TERM GOALS: Target date: 07/28/2022    Patient will be independent in home exercise program to improve strength/mobility for better functional independence with ADLs Baseline: 6/5: 7/16: intermittent compliance  Goal status: Partially Met    LONG TERM GOALS: Target date: 09/22/2022    Patient will increase FOTO score to equal to or greater than   47  to demonstrate statistically significant improvement in mobility and quality of life.  Baseline: 6/5: 40% 7/16: 56%  Goal status:MET  2. Patient (> 7 years old) will complete five times sit to stand  test in < 15 seconds indicating an increased LE strength and improved balance. Baseline: 6/5: 22.71 seconds with BUE support 7/16: 21 with no hands Goal status: Partially Met   3.  Patient will increase 10 meter walk test to >1.81m/s as to improve gait speed for better community ambulation and to reduce fall risk. Baseline: 6/5: 16.47 seconds with hurrycane 7/16: 12 seconds no AD  Goal status: Partially Met   4.   Patient will demonstrate an improved Berg Balance Score of > 38 as to demonstrate improved balance with ADLs such as sitting/standing and transfer balance and reduced fall risk.  Baseline: 6/5: 28/56 7/16: 39/56  Goal status: MET    ASSESSMENT:  CLINICAL IMPRESSION: Pt reports that she is feeling better, went to MD yesterday without incident. Arrived ready to participate. PT treatment focused on dynamic balance training and use of hip strategy to correct Lateral LOB and improved weight shift in functional context. Mild improved throughout session Patient will benefit from skilled physical therapy to increase strength, stability, and mobility for improved quality of life.    OBJECTIVE IMPAIRMENTS: Abnormal gait, decreased activity tolerance, decreased balance, decreased coordination, decreased endurance, decreased mobility, difficulty walking, decreased strength, dizziness, impaired perceived functional ability, impaired flexibility, impaired vision/preception, and improper body mechanics.   ACTIVITY LIMITATIONS: carrying, lifting, bending, standing, squatting, stairs, transfers, bed mobility, dressing, reach over head, locomotion level, and caring for others  PARTICIPATION LIMITATIONS: meal prep, cleaning, laundry, driving, shopping, community activity, and yard work  PERSONAL FACTORS: anemia, breast cancer 2023, diverticulosis of colon, Fuch's corneal dystrophy of R eye, GERD, bilateral hearing loss, HTN, HLD, macular degeneration of bilateral eyes, OA, OAB, osteopenia, urge  incontinence.  are also affecting patient's functional outcome.   REHAB POTENTIAL: Good  CLINICAL DECISION MAKING: Evolving/moderate complexity  EVALUATION COMPLEXITY: Moderate  PLAN:  PT FREQUENCY: 2x/week  PT DURATION: 12 weeks  PLANNED INTERVENTIONS: Therapeutic exercises, Therapeutic activity, Neuromuscular re-education, Balance training, Gait training, Patient/Family education, Self Care, Joint mobilization, Stair training, Vestibular training, Canalith repositioning, Visual/preceptual remediation/compensation, DME instructions, Electrical stimulation, Cryotherapy, Moist heat, Manual lymph drainage, Splintting, Taping, Traction, Ultrasound, Manual therapy, and Re-evaluation  PLAN FOR NEXT SESSION:LE   Dynamic Balance core strength. Weighted gait. BLE  strengthening   Golden Pop PT  Physical Therapist - [place]  [facility]  2:30 PM 09/16/22

## 2022-09-17 LAB — TSH: TSH: 0.97 u[IU]/mL (ref 0.35–5.50)

## 2022-09-17 LAB — VITAMIN D 25 HYDROXY (VIT D DEFICIENCY, FRACTURES): VITD: 37.52 ng/mL (ref 30.00–100.00)

## 2022-09-17 LAB — T4, FREE: Free T4: 1.04 ng/dL (ref 0.60–1.60)

## 2022-09-17 LAB — BRAIN NATRIURETIC PEPTIDE: Pro B Natriuretic peptide (BNP): 56 pg/mL (ref 0.0–100.0)

## 2022-09-17 LAB — VITAMIN B12: Vitamin B-12: 858 pg/mL (ref 211–911)

## 2022-09-19 NOTE — Addendum Note (Signed)
Addended by: Roxy Manns A on: 09/19/2022 01:01 PM   Modules accepted: Orders

## 2022-09-19 NOTE — Progress Notes (Signed)
Referral done

## 2022-09-20 DIAGNOSIS — H40003 Preglaucoma, unspecified, bilateral: Secondary | ICD-10-CM | POA: Diagnosis not present

## 2022-09-20 DIAGNOSIS — H18519 Endothelial corneal dystrophy, unspecified eye: Secondary | ICD-10-CM | POA: Diagnosis not present

## 2022-09-20 DIAGNOSIS — Z961 Presence of intraocular lens: Secondary | ICD-10-CM | POA: Diagnosis not present

## 2022-09-20 DIAGNOSIS — H353123 Nonexudative age-related macular degeneration, left eye, advanced atrophic without subfoveal involvement: Secondary | ICD-10-CM | POA: Diagnosis not present

## 2022-09-20 DIAGNOSIS — H353231 Exudative age-related macular degeneration, bilateral, with active choroidal neovascularization: Secondary | ICD-10-CM | POA: Diagnosis not present

## 2022-09-21 ENCOUNTER — Ambulatory Visit: Payer: Medicare PPO

## 2022-09-23 ENCOUNTER — Ambulatory Visit: Payer: Medicare PPO

## 2022-09-30 ENCOUNTER — Ambulatory Visit: Payer: Medicare PPO | Attending: Family Medicine

## 2022-09-30 DIAGNOSIS — R2681 Unsteadiness on feet: Secondary | ICD-10-CM | POA: Diagnosis not present

## 2022-09-30 DIAGNOSIS — R262 Difficulty in walking, not elsewhere classified: Secondary | ICD-10-CM | POA: Diagnosis not present

## 2022-09-30 DIAGNOSIS — M6281 Muscle weakness (generalized): Secondary | ICD-10-CM | POA: Insufficient documentation

## 2022-09-30 NOTE — Therapy (Signed)
OUTPATIENT PHYSICAL THERAPY NEURO TREATMENT NOTE/ RECERT    Patient Name: Sierra Bentley MRN: [id number] DOB:[birth year], 89 y.o., female Today's Date: 09/30/2022   PCP: Judy Pimple MD REFERRING PROVIDER: Judy Pimple MD   END OF SESSION:  PT End of Session - 09/30/22 0938     Visit Number 19    Number of Visits 31    Date for PT Re-Evaluation 12/23/22    Authorization Type 7/10    PT Start Time 0932    PT Stop Time 1012    PT Time Calculation (min) 40 min    Equipment Utilized During Treatment Gait belt    Activity Tolerance Patient tolerated treatment well    Behavior During Therapy WFL for tasks assessed/performed                           Past Medical History:  Diagnosis Date   Allergic rhinitis, cause unspecified    Anemia    Breast cancer (HCC)    left breast IDC 2023   Chronically dry eyes, bilateral    Diverticulosis of colon    Endometrial polyp    Family history of prostate cancer 03/18/2021   Fuchs' corneal dystrophy of right eye    GERD (gastroesophageal reflux disease)    Hearing loss of both ears    pt stated has hearing aids but does not wear   History of epistaxis    per pt has had several cautization's for nose bleed   History of radiation therapy    05/20/21-06/10/21- Dr. Antony Blackbird   HTN (hypertension)    Hyperlipidemia    Macular degeneration of both eyes    followed @[facility]  by dr Demetrius Charity. mettu;   both age-related and right is with active choroidal neovascularization   OA (osteoarthritis)    OAB (overactive bladder)    Osteopenia    Thickened endometrium    Urge incontinence of urine    Wears glasses    Past Surgical History:  Procedure Laterality Date   BREAST BIOPSY Left 2011   fibrocystic change   BREAST LUMPECTOMY WITH RADIOACTIVE SEED LOCALIZATION Left 04/13/2021   Procedure: LEFT BREAST LUMPECTOMY WITH RADIOACTIVE SEED LOCALIZATION;  Surgeon: Emelia Loron, MD;  Location: [facility];  Service:  General;  Laterality: Left;   CATARACT EXTRACTION W/ INTRAOCULAR LENS IMPLANT Bilateral 2008   COLONOSCOPY  2013   DILATATION & CURETTAGE/HYSTEROSCOPY WITH MYOSURE N/A 11/26/2020   Procedure: DILATATION & CURETTAGE/HYSTEROSCOPY WITH MYOSURE;  Surgeon: Genia Del, MD;  Location: [facility];  Service: Gynecology;  Laterality: N/A;   TONSILLECTOMY  1962   Patient Active Problem List   Diagnosis Date Noted   Fatigue 09/15/2022   History of fall 05/20/2022   Sacral pain 05/20/2022   SOB (shortness of breath) on exertion 05/20/2022   Dysuria 05/20/2022   Grief reaction 06/14/2021   General weakness 06/12/2021   Current use of proton pump inhibitor 03/19/2021   Family history of prostate cancer 03/18/2021   Malignant neoplasm of upper-outer quadrant of left breast in female, estrogen receptor negative (HCC) 03/17/2021   Poor balance 05/07/2019   Exudative age-related macular degeneration, right eye, with active choroidal neovascularization (HCC) 09/04/2018   History of hip fracture 02/27/2018   Pedal edema 09/28/2017   Prediabetes 11/30/2016   At moderate risk for fall 09/05/2015   Cervical spondylosis without myelopathy 08/05/2015   Routine general medical examination at a health care facility 08/27/2014  Colon cancer screening 08/27/2014   Encounter for Medicare annual wellness exam 07/25/2013   Varicosities of leg 05/23/2012   Dry eye syndrome 04/14/2012   Nonexudative senile macular degeneration of retina 04/14/2012   Fuchs' corneal dystrophy 04/14/2012   Pseudophakia 04/14/2012   Abnormal ultrasound of thyroid gland 03/01/2011   Leg cramps 05/27/2009   Hyperlipidemia 06/11/2008   Allergic rhinitis 03/29/2008   Essential hypertension, benign 12/26/2006   GERD 12/26/2006   Rheumatoid arthritis (HCC) 12/26/2006   Osteoporosis 12/26/2006   URINARY INCONTINENCE 12/26/2006    ONSET DATE: a few months per patient report   REFERRING DIAG: poor balance,  history of fall, moderate risk for fall   THERAPY DIAG:  Muscle weakness (generalized)  Difficulty in walking, not elsewhere classified  Rationale for Evaluation and Treatment: Rehabilitation  SUBJECTIVE:                                                                                                                                                                                             SUBJECTIVE STATEMENT:  Pt returns to PT after absence due to dental surgery. She reports no falls and no pain currently. Pt reports she has not been doing HEP since surgery.   Pt accompanied by: self  PERTINENT HISTORY:   Patient presents to physical therapy for balance and fall risk. Patient has been seen in the past at this clinic in 2021. PMH includes anemia, breast cancer 2023, diverticulosis of colon, Fuch's corneal dystrophy of R eye, GERD, bilateral hearing loss, HTN, HLD, macular degeneration of bilateral eyes, OA, OAB, osteopenia, urge incontinence. Patient had therapy a few months ago that was not helpful. Patient reports one fall about a month ago at home and another fall in [employer]; tripped on ramp and fell backwards.  Has dizziness with quick turns and leaning over.   PAIN:  Are you having pain?  Have headaches, affects balance None currently.  PRECAUTIONS: Fall  WEIGHT BEARING RESTRICTIONS: No  FALLS: Has patient fallen in last 6 months? Yes. Number of falls 2  LIVING ENVIRONMENT: Lives with: lives alone Lives in: House/apartment Stairs:  two levels, doesn't go upstairs, 4 stairs in garage, 2 in front, handrails can reach both Has following equipment at home: Quad cane small base and Walker - 2 wheeled  PLOF: Independent  PATIENT GOALS: to get stronger and more steady   OBJECTIVE:   COGNITION: Overall cognitive status: Within functional limits for tasks assessed   SENSATION: WFL  COORDINATION: Heel slide test: very challenging due to fatigue   POSTURE: rounded  shoulders  Vitals: Seated: 113/64 Standing 110/68  LOWER EXTREMITY MMT:    MMT Right Eval Left Eval  Hip flexion 3+ 3+  Hip extension    Hip abduction 3+ 3+  Hip adduction 3+ 3+  Hip internal rotation    Hip external rotation    Knee flexion 4- 4-  Knee extension 3+ 4-  Ankle dorsiflexion 4- 4-  Ankle plantarflexion 4- 4-  Ankle inversion    Ankle eversion    (Blank rows = not tested)   TRANSFERS: Assistive device utilized: Single point cane  Sit to stand: SBA and CGA Stand to sit: SBA Chair to chair: CGA   GAIT: Gait pattern: step through pattern, decreased stride length, and narrow BOS Distance walked: 50 ft  Assistive device utilized:  hurrycane Level of assistance: CGA Comments: patient is challenged with quick steps   FUNCTIONAL TESTS:  5 times sit to stand: 22.71 10 meter walk test: 16.47 seconds with hurrycane  Berg Balance Scale: 28/56  PATIENT SURVEYS:  FOTO 40  TODAY'S TREATMENT:                                                                                                                              DATE: 09/30/22   TA: goal reassessment completed for recert. Please refer to goal section and assessment below for details.  Continued education on limited visit count through insurance for 6 remaining additional visits. Discussed decreasing frequency to 1x/week, pt agreeable to this plan.  Provided education on importance of HEP and increased activity levels. Suggested possibility of group classes outside of PT to maintain fitness levels.   TE: mini squats 2x10 with BUE support - added to HEP  Unbilled: PT took pt to [facility] for community class info as pt expressed interest in pursuing this to increase activity levels.   PATIENT EDUCATION: Education details: goals, plan, Blake Divine activities Pt educated throughout session about proper posture and technique with exercises. Improved exercise technique, movement at target joints, use of  target muscles after min to mod verbal, visual, tactile cues.  Person educated: Patient Education method: Explanation, Demonstration, Tactile cues, Verbal cues, and Handouts Education comprehension: verbalized understanding, returned demonstration, verbal cues required, and tactile cues required  HOME EXERCISE PROGRAM:  9/5: Access Code: [email or web address] URL: [email or web address] Date: 09/30/2022 Prepared by: Temple Pacini  Exercises - Mini Squat with Counter Support  - 1 x daily - 5 x weekly - 2 sets - 10 reps   Access Code: [email or web address] URL: [email or web address] Date: 07/14/2022 Prepared by: Precious Bard  Exercises - Seated Heel Toe Raises  - 1 x daily - 7 x weekly - 2 sets - 10 reps - 5 hold - Standing Tandem Balance with Counter Support  - 1 x daily - 7 x weekly - 2 sets - 2 reps - 30 hold - Standing March with Counter Support  - 1 x daily - 7 x weekly - 2 sets - 10 reps - 5 hold - Ankle Inversion Eversion Towel Slide  -  1 x daily - 7 x weekly - 1 sets - 15 reps - 5 hold  Access Code: [email or web address] URL: [email or web address] Date: 08/10/2022 Prepared by: Precious Bard  Exercises - Dead Bug with Swiss Ball  - 1 x daily - 7 x weekly - 2 sets - 10 reps - 5 hold - Seated Abdominal Press into Swiss Ball  - 1 x daily - 7 x weekly - 2 sets - 10 reps - 5 hold - Supine Hamstring Curl on Swiss Ball  - 1 x daily - 7 x weekly - 2 sets - 10 reps - 5 hold  GOALS: Goals reviewed with patient? Yes  SHORT TERM GOALS: Target date: 07/28/2022    Patient will be independent in home exercise program to improve strength/mobility for better functional independence with ADLs Baseline: 6/5: 7/16: intermittent compliance; 09/30/22: intermittent compliance, pt states "I'm not good at exercising. I'm really not." Goal status: Partially Met    LONG TERM GOALS: Target date: 09/22/2022    Patient will increase FOTO score to equal to or greater than   47  to demonstrate  statistically significant improvement in mobility and quality of life.  Baseline: 6/5: 40% 7/16: 56%; 09/30/22: 49% (some difficulty with understanding questions) Goal status:MET  2. Patient (> 72 years old) will complete five times sit to stand test in < 15 seconds indicating an increased LE strength and improved balance. Baseline: 6/5: 22.71 seconds with BUE support 7/16: 21 with no hands; 09/30/22: 20 sec hands-free Goal status: Partially Met   3.  Patient will increase 10 meter walk test to >1.65m/s as to improve gait speed for better community ambulation and to reduce fall risk. Baseline: 6/5: 16.47 seconds with hurrycane 7/16: 12 seconds no AD; 09/30/22 0.8 m/s no AD, 0.72 m/s with hurrycane  Goal status: Partially Met   4.   Patient will demonstrate an improved Berg Balance Score of > 38 as to demonstrate improved balance with ADLs such as sitting/standing and transfer balance and reduced fall risk.  Baseline: 6/5: 28/56 7/16: 39/56  Goal status: MET    ASSESSMENT:  CLINICAL IMPRESSION: Goal reassessment completed for recert. Pt with slight improvement on 5xSTS, and similar score, although slightly decreased from prior assessment. Pt FOTO score also decreased but still above target number. PT reviewed importance of HEP with pt as she reports intermittent compliance. PT also provided info on community classes outside of therapy to maintain activity levels. Pt expressed info in [place] and PT provided info. PT also updated HEP (see above). Pt aware of visit limit and agreeable at this time to decrease to 1x/week. Patient will benefit from skilled physical therapy to increase strength, stability, and mobility for improved quality of life.    OBJECTIVE IMPAIRMENTS: Abnormal gait, decreased activity tolerance, decreased balance, decreased coordination, decreased endurance, decreased mobility, difficulty walking, decreased strength, dizziness, impaired perceived functional ability, impaired  flexibility, impaired vision/preception, and improper body mechanics.   ACTIVITY LIMITATIONS: carrying, lifting, bending, standing, squatting, stairs, transfers, bed mobility, dressing, reach over head, locomotion level, and caring for others  PARTICIPATION LIMITATIONS: meal prep, cleaning, laundry, driving, shopping, community activity, and yard work  PERSONAL FACTORS: anemia, breast cancer 2023, diverticulosis of colon, Fuch's corneal dystrophy of R eye, GERD, bilateral hearing loss, HTN, HLD, macular degeneration of bilateral eyes, OA, OAB, osteopenia, urge incontinence.  are also affecting patient's functional outcome.   REHAB POTENTIAL: Good  CLINICAL DECISION MAKING: Evolving/moderate complexity  EVALUATION COMPLEXITY: Moderate  PLAN:  PT FREQUENCY: 1x/week  PT DURATION: 12 weeks  PLANNED INTERVENTIONS: Therapeutic exercises, Therapeutic activity, Neuromuscular re-education, Balance training, Gait training, Patient/Family education, Self Care, Joint mobilization, Stair training, Vestibular training, Canalith repositioning, Visual/preceptual remediation/compensation, DME instructions, Electrical stimulation, Cryotherapy, Moist heat, Manual lymph drainage, Splintting, Taping, Traction, Ultrasound, Manual therapy, and Re-evaluation  PLAN FOR NEXT SESSION:LE   Dynamic Balance core strength. Weighted gait. BLE strengthening   Baird Kay PT  Physical Therapist - [facility]  [facility]  5:28 PM 09/30/22

## 2022-10-04 ENCOUNTER — Ambulatory Visit: Payer: Medicare PPO

## 2022-10-05 ENCOUNTER — Ambulatory Visit: Payer: Medicare PPO | Admitting: Physical Therapy

## 2022-10-07 ENCOUNTER — Ambulatory Visit: Payer: Medicare PPO | Admitting: Physical Therapy

## 2022-10-07 DIAGNOSIS — R262 Difficulty in walking, not elsewhere classified: Secondary | ICD-10-CM | POA: Diagnosis not present

## 2022-10-07 DIAGNOSIS — M6281 Muscle weakness (generalized): Secondary | ICD-10-CM | POA: Diagnosis not present

## 2022-10-07 DIAGNOSIS — R2681 Unsteadiness on feet: Secondary | ICD-10-CM | POA: Diagnosis not present

## 2022-10-07 NOTE — Therapy (Signed)
OUTPATIENT PHYSICAL THERAPY NEURO TREATMENT NOTE/  PHYSICAL THERAPY PROGRESS NOTE   Dates of reporting period  08/10/2022   to  10/07/2022   Patient Name: Sierra Bentley MRN: [id number] DOB:10-14-1933, 89 y.o., female Today's Date: 10/07/2022   PCP: Judy Pimple MD REFERRING PROVIDER: Judy Pimple MD   END OF SESSION:  PT End of Session - 10/07/22 0938     Visit Number 20    Number of Visits 31    Date for PT Re-Evaluation 12/23/22    Authorization Type 7/10    PT Start Time 0935    PT Stop Time 1015    PT Time Calculation (min) 40 min    Equipment Utilized During Treatment Gait belt    Activity Tolerance Patient tolerated treatment well    Behavior During Therapy WFL for tasks assessed/performed                           Past Medical History:  Diagnosis Date   Allergic rhinitis, cause unspecified    Anemia    Breast cancer (HCC)    left breast IDC 2023   Chronically dry eyes, bilateral    Diverticulosis of colon    Endometrial polyp    Family history of prostate cancer 03/18/2021   Fuchs' corneal dystrophy of right eye    GERD (gastroesophageal reflux disease)    Hearing loss of both ears    pt stated has hearing aids but does not wear   History of epistaxis    per pt has had several cautization's for nose bleed   History of radiation therapy    05/20/21-06/10/21- Dr. Antony Blackbird   HTN (hypertension)    Hyperlipidemia    Macular degeneration of both eyes    followed @[facility]  by dr Demetrius Charity. mettu;   both age-related and right is with active choroidal neovascularization   OA (osteoarthritis)    OAB (overactive bladder)    Osteopenia    Thickened endometrium    Urge incontinence of urine    Wears glasses    Past Surgical History:  Procedure Laterality Date   BREAST BIOPSY Left 2011   fibrocystic change   BREAST LUMPECTOMY WITH RADIOACTIVE SEED LOCALIZATION Left 04/13/2021   Procedure: LEFT BREAST LUMPECTOMY WITH RADIOACTIVE SEED  LOCALIZATION;  Surgeon: Emelia Loron, MD;  Location: [facility];  Service: General;  Laterality: Left;   CATARACT EXTRACTION W/ INTRAOCULAR LENS IMPLANT Bilateral 2008   COLONOSCOPY  2013   DILATATION & CURETTAGE/HYSTEROSCOPY WITH MYOSURE N/A 11/26/2020   Procedure: DILATATION & CURETTAGE/HYSTEROSCOPY WITH MYOSURE;  Surgeon: Genia Del, MD;  Location: [facility];  Service: Gynecology;  Laterality: N/A;   TONSILLECTOMY  1962   Patient Active Problem List   Diagnosis Date Noted   Fatigue 09/15/2022   History of fall 05/20/2022   Sacral pain 05/20/2022   SOB (shortness of breath) on exertion 05/20/2022   Dysuria 05/20/2022   Grief reaction 06/14/2021   General weakness 06/12/2021   Current use of proton pump inhibitor 03/19/2021   Family history of prostate cancer 03/18/2021   Malignant neoplasm of upper-outer quadrant of left breast in female, estrogen receptor negative (HCC) 03/17/2021   Poor balance 05/07/2019   Exudative age-related macular degeneration, right eye, with active choroidal neovascularization (HCC) 09/04/2018   History of hip fracture 02/27/2018   Pedal edema 09/28/2017   Prediabetes 11/30/2016   At moderate risk for fall 09/05/2015   Cervical spondylosis  without myelopathy 08/05/2015   Routine general medical examination at a health care facility 08/27/2014   Colon cancer screening 08/27/2014   Encounter for Medicare annual wellness exam 07/25/2013   Varicosities of leg 05/23/2012   Dry eye syndrome 04/14/2012   Nonexudative senile macular degeneration of retina 04/14/2012   Fuchs' corneal dystrophy 04/14/2012   Pseudophakia 04/14/2012   Abnormal ultrasound of thyroid gland 03/01/2011   Leg cramps 05/27/2009   Hyperlipidemia 06/11/2008   Allergic rhinitis 03/29/2008   Essential hypertension, benign 12/26/2006   GERD 12/26/2006   Rheumatoid arthritis (HCC) 12/26/2006   Osteoporosis 12/26/2006   URINARY INCONTINENCE  12/26/2006    ONSET DATE: a few months per patient report   REFERRING DIAG: poor balance, history of fall, moderate risk for fall   THERAPY DIAG:  Muscle weakness (generalized)  Difficulty in walking, not elsewhere classified  Unsteadiness on feet  Rationale for Evaluation and Treatment: Rehabilitation  SUBJECTIVE:                                                                                                                                                                                             SUBJECTIVE STATEMENT:  Pt reports that she is doing well. Reports that she feels like she is not making as much progress as she would like, but will be more consistent with HEP at home moving forward.   Pt accompanied by: self  PERTINENT HISTORY:   Patient presents to physical therapy for balance and fall risk. Patient has been seen in the past at this clinic in 2021. PMH includes anemia, breast cancer 2023, diverticulosis of colon, Fuch's corneal dystrophy of R eye, GERD, bilateral hearing loss, HTN, HLD, macular degeneration of bilateral eyes, OA, OAB, osteopenia, urge incontinence. Patient had therapy a few months ago that was not helpful. Patient reports one fall about a month ago at home and another fall in [employer]; tripped on ramp and fell backwards.  Has dizziness with quick turns and leaning over.   PAIN:  Are you having pain?  Have headaches, affects balance None currently.  PRECAUTIONS: Fall  WEIGHT BEARING RESTRICTIONS: No  FALLS: Has patient fallen in last 6 months? Yes. Number of falls 2  LIVING ENVIRONMENT: Lives with: lives alone Lives in: House/apartment Stairs:  two levels, doesn't go upstairs, 4 stairs in garage, 2 in front, handrails can reach both Has following equipment at home: Quad cane small base and Walker - 2 wheeled  PLOF: Independent  PATIENT GOALS: to get stronger and more steady   OBJECTIVE:   COGNITION: Overall cognitive status: Within  functional limits for tasks assessed  SENSATION: WFL  COORDINATION: Heel slide test: very challenging due to fatigue   POSTURE: rounded shoulders  Vitals: Seated: 113/64 Standing 110/68    LOWER EXTREMITY MMT:    MMT Right Eval Left Eval  Hip flexion 3+ 3+  Hip extension    Hip abduction 3+ 3+  Hip adduction 3+ 3+  Hip internal rotation    Hip external rotation    Knee flexion 4- 4-  Knee extension 3+ 4-  Ankle dorsiflexion 4- 4-  Ankle plantarflexion 4- 4-  Ankle inversion    Ankle eversion    (Blank rows = not tested)   TRANSFERS: Assistive device utilized: Single point cane  Sit to stand: SBA and CGA Stand to sit: SBA Chair to chair: CGA   GAIT: Gait pattern: step through pattern, decreased stride length, and narrow BOS Distance walked: 50 ft  Assistive device utilized:  hurrycane Level of assistance: CGA Comments: patient is challenged with quick steps   FUNCTIONAL TESTS:  5 times sit to stand: 22.71 10 meter walk test: 16.47 seconds with hurrycane  Berg Balance Scale: 28/56  PATIENT SURVEYS:  FOTO 40  TODAY'S TREATMENT:                                                                                                                              DATE: 10/07/22   Patient demonstrates increased fall risk as noted by score of   43/56 on Berg Balance Scale.  (<36= high risk for falls, close to 100%; 37-45 significant >80%; 46-51 moderate >50%; 52-55 lower >25%)  Gait without AD x 386ft with supervision. One mild LOB with staggering steps, in turn, but able to correct without increased assist from PT.   PT instructed pt in obstacle navigation to weave through 4 cones, up/down 2 airex pads, over 2 half bolsters and over 1 cane 3x 2 bouts of 4ft. Additional 2 bouts through course with added red mat on floor to simulate unlevel ground. CGA throughout with min assist x 1 with attempting large step over bolster resulting in lateral LOB.   Sit<>stand from  chair without UE support 2 x 5 with cues to keep Bil knees from touching and improve gluteal activation.   CGA throughout session with gait belt in place to prevent LOB and improved safety. Multiple rest breaks throughout session due to mild BLE fatigue.   PATIENT EDUCATION: Education details: goals, plan, Blake Divine activities Pt educated throughout session about proper posture and technique with exercises. Improved exercise technique, movement at target joints, use of target muscles after min to mod verbal, visual, tactile cues.  Person educated: Patient Education method: Explanation, Demonstration, Tactile cues, Verbal cues, and Handouts Education comprehension: verbalized understanding, returned demonstration, verbal cues required, and tactile cues required  HOME EXERCISE PROGRAM:  9/5: Access Code: [email or web address] URL: [email or web address] Date: 09/30/2022 Prepared by: Temple Pacini  Exercises - Mini Squat with Counter Support  - 1 x daily - 5 x weekly -  2 sets - 10 reps   Access Code: [email or web address] URL: [email or web address] Date: 07/14/2022 Prepared by: Precious Bard  Exercises - Seated Heel Toe Raises  - 1 x daily - 7 x weekly - 2 sets - 10 reps - 5 hold - Standing Tandem Balance with Counter Support  - 1 x daily - 7 x weekly - 2 sets - 2 reps - 30 hold - Standing March with Counter Support  - 1 x daily - 7 x weekly - 2 sets - 10 reps - 5 hold - Ankle Inversion Eversion Towel Slide  - 1 x daily - 7 x weekly - 1 sets - 15 reps - 5 hold  Access Code: [email or web address] URL: [email or web address] Date: 08/10/2022 Prepared by: Precious Bard  Exercises - Dead Bug with Swiss Ball  - 1 x daily - 7 x weekly - 2 sets - 10 reps - 5 hold - Seated Abdominal Press into Swiss Ball  - 1 x daily - 7 x weekly - 2 sets - 10 reps - 5 hold - Supine Hamstring Curl on Swiss Ball  - 1 x daily - 7 x weekly - 2 sets - 10 reps - 5 hold  GOALS: Goals reviewed with patient?  Yes  SHORT TERM GOALS: Target date: 07/28/2022    Patient will be independent in home exercise program to improve strength/mobility for better functional independence with ADLs Baseline: 6/5: 7/16: intermittent compliance; 09/30/22: intermittent compliance, pt states "I'm not good at exercising. I'm really not." Goal status: Partially Met    LONG TERM GOALS: Target date: 09/22/2022    Patient will increase FOTO score to equal to or greater than   47  to demonstrate statistically significant improvement in mobility and quality of life.  Baseline: 6/5: 40% 7/16: 56%; 09/30/22: 49% (some difficulty with understanding questions) Goal status:MET  2. Patient (> 56 years old) will complete five times sit to stand test in < 15 seconds indicating an increased LE strength and improved balance. Baseline: 6/5: 22.71 seconds with BUE support 7/16: 21 with no hands; 09/30/22: 20 sec hands-free Goal status: Partially Met   3.  Patient will increase 10 meter walk test to >1.49m/s as to improve gait speed for better community ambulation and to reduce fall risk. Baseline: 6/5: 16.47 seconds with hurrycane 7/16: 12 seconds no AD; 09/30/22 0.8 m/s no AD, 0.72 m/s with hurrycane  Goal status: Partially Met   4.   Patient will demonstrate an improved Berg Balance Score of > 38 as to demonstrate improved balance with ADLs such as sitting/standing and transfer balance and reduced fall risk.  Baseline: 6/5: 28/56 7/16: 39/56. 9/12:43/56 Goal status: MET    ASSESSMENT:  CLINICAL IMPRESSION:  Pt arived to PT motivated to participate. PT goals assess at last visit for re-certification as listed above. Berg balance scale assess 43/56 indicating increased fall risk with score<45. Dynamic balance and gait training with obstacle navigation training to simulate real world situations. Mild LOB when stepping on unlevel surface and over obstacles in floor requiring occasional assist to prevent fall.  Patient will benefit from  skilled physical therapy to increase strength, stability, and mobility for improved quality of life.    OBJECTIVE IMPAIRMENTS: Abnormal gait, decreased activity tolerance, decreased balance, decreased coordination, decreased endurance, decreased mobility, difficulty walking, decreased strength, dizziness, impaired perceived functional ability, impaired flexibility, impaired vision/preception, and improper body mechanics.   ACTIVITY LIMITATIONS: carrying, lifting, bending, standing, squatting, stairs, transfers, bed mobility, dressing, reach over head, locomotion level, and  caring for others  PARTICIPATION LIMITATIONS: meal prep, cleaning, laundry, driving, shopping, community activity, and yard work  PERSONAL FACTORS: anemia, breast cancer 2023, diverticulosis of colon, Fuch's corneal dystrophy of R eye, GERD, bilateral hearing loss, HTN, HLD, macular degeneration of bilateral eyes, OA, OAB, osteopenia, urge incontinence.  are also affecting patient's functional outcome.   REHAB POTENTIAL: Good  CLINICAL DECISION MAKING: Evolving/moderate complexity  EVALUATION COMPLEXITY: Moderate  PLAN:  PT FREQUENCY: 1x/week  PT DURATION: 12 weeks  PLANNED INTERVENTIONS: Therapeutic exercises, Therapeutic activity, Neuromuscular re-education, Balance training, Gait training, Patient/Family education, Self Care, Joint mobilization, Stair training, Vestibular training, Canalith repositioning, Visual/preceptual remediation/compensation, DME instructions, Electrical stimulation, Cryotherapy, Moist heat, Manual lymph drainage, Splintting, Taping, Traction, Ultrasound, Manual therapy, and Re-evaluation  PLAN FOR NEXT SESSION:LE   Dynamic Balance core strength. Weighted gait. BLE strengthening   Golden Pop PT  Physical Therapist - [facility]  [facility]  9:39 AM 10/07/22

## 2022-10-12 ENCOUNTER — Ambulatory Visit: Payer: Medicare PPO

## 2022-10-14 ENCOUNTER — Ambulatory Visit: Payer: Medicare PPO

## 2022-10-14 DIAGNOSIS — R2681 Unsteadiness on feet: Secondary | ICD-10-CM | POA: Diagnosis not present

## 2022-10-14 DIAGNOSIS — M6281 Muscle weakness (generalized): Secondary | ICD-10-CM

## 2022-10-14 DIAGNOSIS — R262 Difficulty in walking, not elsewhere classified: Secondary | ICD-10-CM | POA: Diagnosis not present

## 2022-10-14 NOTE — Therapy (Signed)
OUTPATIENT PHYSICAL THERAPY NEURO TREATMENT NOTE  Patient Name: Sierra Bentley MRN: [id number] DOB:1933-07-02, 89 y.o., female Today's Date: 10/14/2022   PCP: Judy Pimple MD REFERRING PROVIDER: Judy Pimple MD   END OF SESSION:  PT End of Session - 10/14/22 0927     Visit Number 21    Number of Visits 31    Date for PT Re-Evaluation 12/23/22    Authorization Type 7/10    PT Start Time 0929    PT Stop Time 1010    PT Time Calculation (min) 41 min    Equipment Utilized During Treatment Gait belt    Activity Tolerance Patient tolerated treatment well    Behavior During Therapy WFL for tasks assessed/performed              Past Medical History:  Diagnosis Date   Allergic rhinitis, cause unspecified    Anemia    Breast cancer (HCC)    left breast IDC 2023   Chronically dry eyes, bilateral    Diverticulosis of colon    Endometrial polyp    Family history of prostate cancer 03/18/2021   Fuchs' corneal dystrophy of right eye    GERD (gastroesophageal reflux disease)    Hearing loss of both ears    pt stated has hearing aids but does not wear   History of epistaxis    per pt has had several cautization's for nose bleed   History of radiation therapy    05/20/21-06/10/21- Dr. Antony Blackbird   HTN (hypertension)    Hyperlipidemia    Macular degeneration of both eyes    followed @[facility]  by dr Demetrius Charity. mettu;   both age-related and right is with active choroidal neovascularization   OA (osteoarthritis)    OAB (overactive bladder)    Osteopenia    Thickened endometrium    Urge incontinence of urine    Wears glasses    Past Surgical History:  Procedure Laterality Date   BREAST BIOPSY Left 2011   fibrocystic change   BREAST LUMPECTOMY WITH RADIOACTIVE SEED LOCALIZATION Left 04/13/2021   Procedure: LEFT BREAST LUMPECTOMY WITH RADIOACTIVE SEED LOCALIZATION;  Surgeon: Emelia Loron, MD;  Location: [facility];  Service: General;  Laterality: Left;    CATARACT EXTRACTION W/ INTRAOCULAR LENS IMPLANT Bilateral 2008   COLONOSCOPY  2013   DILATATION & CURETTAGE/HYSTEROSCOPY WITH MYOSURE N/A 11/26/2020   Procedure: DILATATION & CURETTAGE/HYSTEROSCOPY WITH MYOSURE;  Surgeon: Genia Del, MD;  Location: [facility];  Service: Gynecology;  Laterality: N/A;   TONSILLECTOMY  1962   Patient Active Problem List   Diagnosis Date Noted   Fatigue 09/15/2022   History of fall 05/20/2022   Sacral pain 05/20/2022   SOB (shortness of breath) on exertion 05/20/2022   Dysuria 05/20/2022   Grief reaction 06/14/2021   General weakness 06/12/2021   Current use of proton pump inhibitor 03/19/2021   Family history of prostate cancer 03/18/2021   Malignant neoplasm of upper-outer quadrant of left breast in female, estrogen receptor negative (HCC) 03/17/2021   Poor balance 05/07/2019   Exudative age-related macular degeneration, right eye, with active choroidal neovascularization (HCC) 09/04/2018   History of hip fracture 02/27/2018   Pedal edema 09/28/2017   Prediabetes 11/30/2016   At moderate risk for fall 09/05/2015   Cervical spondylosis without myelopathy 08/05/2015   Routine general medical examination at a health care facility 08/27/2014   Colon cancer screening 08/27/2014   Encounter for Medicare annual wellness exam 07/25/2013  Varicosities of leg 05/23/2012   Dry eye syndrome 04/14/2012   Nonexudative senile macular degeneration of retina 04/14/2012   Fuchs' corneal dystrophy 04/14/2012   Pseudophakia 04/14/2012   Abnormal ultrasound of thyroid gland 03/01/2011   Leg cramps 05/27/2009   Hyperlipidemia 06/11/2008   Allergic rhinitis 03/29/2008   Essential hypertension, benign 12/26/2006   GERD 12/26/2006   Rheumatoid arthritis (HCC) 12/26/2006   Osteoporosis 12/26/2006   URINARY INCONTINENCE 12/26/2006    ONSET DATE: a few months per patient report   REFERRING DIAG: poor balance, history of fall, moderate risk for  fall   THERAPY DIAG:  Muscle weakness (generalized)  Difficulty in walking, not elsewhere classified  Unsteadiness on feet  Rationale for Evaluation and Treatment: Rehabilitation  SUBJECTIVE:                                                                                                                                                                                             SUBJECTIVE STATEMENT:   Pt reports it is an early day for her due to her appointment being earlier than normal.  No pain reported and no falls since the last visit.  Pt accompanied by: self  PERTINENT HISTORY:   Patient presents to physical therapy for balance and fall risk. Patient has been seen in the past at this clinic in 2021. PMH includes anemia, breast cancer 2023, diverticulosis of colon, Fuch's corneal dystrophy of R eye, GERD, bilateral hearing loss, HTN, HLD, macular degeneration of bilateral eyes, OA, OAB, osteopenia, urge incontinence. Patient had therapy a few months ago that was not helpful. Patient reports one fall about a month ago at home and another fall in [employer]; tripped on ramp and fell backwards.  Has dizziness with quick turns and leaning over.   PAIN:  Are you having pain?  Have headaches, affects balance None currently.  PRECAUTIONS: Fall  WEIGHT BEARING RESTRICTIONS: No  FALLS: Has patient fallen in last 6 months? Yes. Number of falls 2  LIVING ENVIRONMENT: Lives with: lives alone Lives in: House/apartment Stairs:  two levels, doesn't go upstairs, 4 stairs in garage, 2 in front, handrails can reach both Has following equipment at home: Quad cane small base and Walker - 2 wheeled  PLOF: Independent  PATIENT GOALS: to get stronger and more steady   OBJECTIVE:   COGNITION: Overall cognitive status: Within functional limits for tasks assessed   SENSATION: WFL  COORDINATION: Heel slide test: very challenging due to fatigue   POSTURE: rounded  shoulders  Vitals: Seated: 113/64 Standing 110/68    LOWER EXTREMITY MMT:    MMT Right Eval Left Eval  Hip flexion 3+ 3+  Hip extension    Hip abduction 3+ 3+  Hip adduction 3+ 3+  Hip internal rotation    Hip external rotation    Knee flexion 4- 4-  Knee extension 3+ 4-  Ankle dorsiflexion 4- 4-  Ankle plantarflexion 4- 4-  Ankle inversion    Ankle eversion    (Blank rows = not tested)   TRANSFERS: Assistive device utilized: Single point cane  Sit to stand: SBA and CGA Stand to sit: SBA Chair to chair: CGA   GAIT: Gait pattern: step through pattern, decreased stride length, and narrow BOS Distance walked: 50 ft  Assistive device utilized:  hurrycane Level of assistance: CGA Comments: patient is challenged with quick steps   FUNCTIONAL TESTS:  5 times sit to stand: 22.71 10 meter walk test: 16.47 seconds with hurrycane  Berg Balance Scale: 28/56  PATIENT SURVEYS:  FOTO 40  TODAY'S TREATMENT: DATE: 10/14/22   TherAct:   STS x10 with arms crossed, verbal and tactile cuing for proper form and pt was able to tolerate improved technique and endurance with less UE support.   Ambulation to the cancer center and back, no AD, CGA, and one episode of LOB at the end of the walk.     TherEx:  Standing mini squats 2x10 with BUE support - added to HEP  Seated hamstring curls with green theraband resistance applied by therapist, 2x10  Seated resisted marching with green TB around knees, 2x10 each LE  Seated hip abduction with green TB around knees, 2x10     PATIENT EDUCATION: Education details: goals, plan, [employer] activities Pt educated throughout session about proper posture and technique with exercises. Improved exercise technique, movement at target joints, use of target muscles after min to mod verbal, visual, tactile cues.  Person educated: Patient Education method: Explanation, Demonstration, Tactile cues, Verbal cues, and Handouts Education  comprehension: verbalized understanding, returned demonstration, verbal cues required, and tactile cues required   HOME EXERCISE PROGRAM:  9/5: Access Code: [email or web address] URL: [email or web address] Date: 09/30/2022 Prepared by: Temple Pacini  Exercises - Mini Squat with Counter Support  - 1 x daily - 5 x weekly - 2 sets - 10 reps   Access Code: [email or web address] URL: [email or web address] Date: 07/14/2022 Prepared by: Precious Bard  Exercises - Seated Heel Toe Raises  - 1 x daily - 7 x weekly - 2 sets - 10 reps - 5 hold - Standing Tandem Balance with Counter Support  - 1 x daily - 7 x weekly - 2 sets - 2 reps - 30 hold - Standing March with Counter Support  - 1 x daily - 7 x weekly - 2 sets - 10 reps - 5 hold - Ankle Inversion Eversion Towel Slide  - 1 x daily - 7 x weekly - 1 sets - 15 reps - 5 hold  Access Code: [email or web address] URL: [email or web address] Date: 08/10/2022 Prepared by: Precious Bard  Exercises - Dead Bug with Swiss Ball  - 1 x daily - 7 x weekly - 2 sets - 10 reps - 5 hold - Seated Abdominal Press into Swiss Ball  - 1 x daily - 7 x weekly - 2 sets - 10 reps - 5 hold - Supine Hamstring Curl on Swiss Ball  - 1 x daily - 7 x weekly - 2 sets - 10 reps - 5 hold  GOALS: Goals reviewed with patient? Yes  SHORT TERM GOALS: Target date: 07/28/2022  Patient will be independent in home exercise program to  improve strength/mobility for better functional independence with ADLs Baseline: 6/5: 7/16: intermittent compliance; 09/30/22: intermittent compliance, pt states "I'm not good at exercising. I'm really not." Goal status: Partially Met    LONG TERM GOALS: Target date: 09/22/2022  Patient will increase FOTO score to equal to or greater than   47  to demonstrate statistically significant improvement in mobility and quality of life.  Baseline: 6/5: 40% 7/16: 56%; 09/30/22: 49% (some difficulty with understanding questions) Goal status:MET  2. Patient  (> 41 years old) will complete five times sit to stand test in < 15 seconds indicating an increased LE strength and improved balance. Baseline: 6/5: 22.71 seconds with BUE support 7/16: 21 with no hands; 09/30/22: 20 sec hands-free Goal status: Partially Met   3.  Patient will increase 10 meter walk test to >1.81m/s as to improve gait speed for better community ambulation and to reduce fall risk. Baseline: 6/5: 16.47 seconds with hurrycane 7/16: 12 seconds no AD; 09/30/22 0.8 m/s no AD, 0.72 m/s with hurrycane  Goal status: Partially Met   4.   Patient will demonstrate an improved Berg Balance Score of > 38 as to demonstrate improved balance with ADLs such as sitting/standing and transfer balance and reduced fall risk.  Baseline: 6/5: 28/56 7/16: 39/56. 9/12:43/56 Goal status: MET    ASSESSMENT:  CLINICAL IMPRESSION:  Pt responded well to the exercises and was able to tolerate increased LE strengthening exercises.  Pt also performed well with the ambulation without an AD, however did have one instance of instability at the entrance to the therapy clinic.  Pt otherwise stable and handled the increased ambulation attempt well.   Pt will continue to benefit from skilled therapy to address remaining deficits in order to improve overall QoL and return to PLOF.      OBJECTIVE IMPAIRMENTS: Abnormal gait, decreased activity tolerance, decreased balance, decreased coordination, decreased endurance, decreased mobility, difficulty walking, decreased strength, dizziness, impaired perceived functional ability, impaired flexibility, impaired vision/preception, and improper body mechanics.   ACTIVITY LIMITATIONS: carrying, lifting, bending, standing, squatting, stairs, transfers, bed mobility, dressing, reach over head, locomotion level, and caring for others  PARTICIPATION LIMITATIONS: meal prep, cleaning, laundry, driving, shopping, community activity, and yard work  PERSONAL FACTORS: anemia, breast cancer  2023, diverticulosis of colon, Fuch's corneal dystrophy of R eye, GERD, bilateral hearing loss, HTN, HLD, macular degeneration of bilateral eyes, OA, OAB, osteopenia, urge incontinence.  are also affecting patient's functional outcome.   REHAB POTENTIAL: Good  CLINICAL DECISION MAKING: Evolving/moderate complexity  EVALUATION COMPLEXITY: Moderate  PLAN:  PT FREQUENCY: 1x/week  PT DURATION: 12 weeks  PLANNED INTERVENTIONS: Therapeutic exercises, Therapeutic activity, Neuromuscular re-education, Balance training, Gait training, Patient/Family education, Self Care, Joint mobilization, Stair training, Vestibular training, Canalith repositioning, Visual/preceptual remediation/compensation, DME instructions, Electrical stimulation, Cryotherapy, Moist heat, Manual lymph drainage, Splintting, Taping, Traction, Ultrasound, Manual therapy, and Re-evaluation  PLAN FOR NEXT SESSION:LE   Dynamic Balance core strength. Weighted gait. BLE strengthening    Nolon Bussing, PT, DPT Physical Therapist - [place]  [facility]  10/14/22, 1:15 PM

## 2022-10-18 ENCOUNTER — Ambulatory Visit: Payer: Medicare PPO | Admitting: Physical Therapy

## 2022-10-18 DIAGNOSIS — R262 Difficulty in walking, not elsewhere classified: Secondary | ICD-10-CM | POA: Diagnosis not present

## 2022-10-18 DIAGNOSIS — M6281 Muscle weakness (generalized): Secondary | ICD-10-CM | POA: Diagnosis not present

## 2022-10-18 DIAGNOSIS — R2681 Unsteadiness on feet: Secondary | ICD-10-CM

## 2022-10-18 NOTE — Therapy (Signed)
OUTPATIENT PHYSICAL THERAPY NEURO TREATMENT NOTE  Patient Name: Sierra Bentley MRN: [id number] DOB:02/26/1933, 89 y.o., female Today's Date: 10/18/2022   PCP: Judy Pimple MD REFERRING PROVIDER: Judy Pimple MD   END OF SESSION:  PT End of Session - 10/18/22 1254     Visit Number 22    Number of Visits 31    Date for PT Re-Evaluation 12/23/22    Authorization Type 7/10    Progress Note Due on Visit 10    PT Start Time 1200    PT Stop Time 1230    PT Time Calculation (min) 30 min    Equipment Utilized During Treatment Gait belt    Activity Tolerance Patient tolerated treatment well    Behavior During Therapy WFL for tasks assessed/performed               Past Medical History:  Diagnosis Date   Allergic rhinitis, cause unspecified    Anemia    Breast cancer (HCC)    left breast IDC 2023   Chronically dry eyes, bilateral    Diverticulosis of colon    Endometrial polyp    Family history of prostate cancer 03/18/2021   Fuchs' corneal dystrophy of right eye    GERD (gastroesophageal reflux disease)    Hearing loss of both ears    pt stated has hearing aids but does not wear   History of epistaxis    per pt has had several cautization's for nose bleed   History of radiation therapy    05/20/21-06/10/21- Dr. Antony Blackbird   HTN (hypertension)    Hyperlipidemia    Macular degeneration of both eyes    followed @[facility]  by dr Demetrius Charity. mettu;   both age-related and right is with active choroidal neovascularization   OA (osteoarthritis)    OAB (overactive bladder)    Osteopenia    Thickened endometrium    Urge incontinence of urine    Wears glasses    Past Surgical History:  Procedure Laterality Date   BREAST BIOPSY Left 2011   fibrocystic change   BREAST LUMPECTOMY WITH RADIOACTIVE SEED LOCALIZATION Left 04/13/2021   Procedure: LEFT BREAST LUMPECTOMY WITH RADIOACTIVE SEED LOCALIZATION;  Surgeon: Emelia Loron, MD;  Location: [facility];  Service:  General;  Laterality: Left;   CATARACT EXTRACTION W/ INTRAOCULAR LENS IMPLANT Bilateral 2008   COLONOSCOPY  2013   DILATATION & CURETTAGE/HYSTEROSCOPY WITH MYOSURE N/A 11/26/2020   Procedure: DILATATION & CURETTAGE/HYSTEROSCOPY WITH MYOSURE;  Surgeon: Genia Del, MD;  Location: [facility];  Service: Gynecology;  Laterality: N/A;   TONSILLECTOMY  1962   Patient Active Problem List   Diagnosis Date Noted   Fatigue 09/15/2022   History of fall 05/20/2022   Sacral pain 05/20/2022   SOB (shortness of breath) on exertion 05/20/2022   Dysuria 05/20/2022   Grief reaction 06/14/2021   General weakness 06/12/2021   Current use of proton pump inhibitor 03/19/2021   Family history of prostate cancer 03/18/2021   Malignant neoplasm of upper-outer quadrant of left breast in female, estrogen receptor negative (HCC) 03/17/2021   Poor balance 05/07/2019   Exudative age-related macular degeneration, right eye, with active choroidal neovascularization (HCC) 09/04/2018   History of hip fracture 02/27/2018   Pedal edema 09/28/2017   Prediabetes 11/30/2016   At moderate risk for fall 09/05/2015   Cervical spondylosis without myelopathy 08/05/2015   Routine general medical examination at a health care facility 08/27/2014   Colon cancer screening 08/27/2014  Encounter for Medicare annual wellness exam 07/25/2013   Varicosities of leg 05/23/2012   Dry eye syndrome 04/14/2012   Nonexudative senile macular degeneration of retina 04/14/2012   Fuchs' corneal dystrophy 04/14/2012   Pseudophakia 04/14/2012   Abnormal ultrasound of thyroid gland 03/01/2011   Leg cramps 05/27/2009   Hyperlipidemia 06/11/2008   Allergic rhinitis 03/29/2008   Essential hypertension, benign 12/26/2006   GERD 12/26/2006   Rheumatoid arthritis (HCC) 12/26/2006   Osteoporosis 12/26/2006   URINARY INCONTINENCE 12/26/2006    ONSET DATE: a few months per patient report   REFERRING DIAG: poor balance,  history of fall, moderate risk for fall   THERAPY DIAG:  Muscle weakness (generalized)  Difficulty in walking, not elsewhere classified  Unsteadiness on feet  Rationale for Evaluation and Treatment: Rehabilitation  SUBJECTIVE:                                                                                                                                                                                             SUBJECTIVE STATEMENT:   Pt reports it is an early day for her due to her appointment being earlier than normal.  No pain reported and no falls since the last visit.  Pt accompanied by: self  PERTINENT HISTORY:   Patient presents to physical therapy for balance and fall risk. Patient has been seen in the past at this clinic in 2021. PMH includes anemia, breast cancer 2023, diverticulosis of colon, Fuch's corneal dystrophy of R eye, GERD, bilateral hearing loss, HTN, HLD, macular degeneration of bilateral eyes, OA, OAB, osteopenia, urge incontinence. Patient had therapy a few months ago that was not helpful. Patient reports one fall about a month ago at home and another fall in [employer]; tripped on ramp and fell backwards.  Has dizziness with quick turns and leaning over.   PAIN:  Are you having pain?  Have headaches, affects balance None currently.  PRECAUTIONS: Fall  WEIGHT BEARING RESTRICTIONS: No  FALLS: Has patient fallen in last 6 months? Yes. Number of falls 2  LIVING ENVIRONMENT: Lives with: lives alone Lives in: House/apartment Stairs:  two levels, doesn't go upstairs, 4 stairs in garage, 2 in front, handrails can reach both Has following equipment at home: Quad cane small base and Walker - 2 wheeled  PLOF: Independent  PATIENT GOALS: to get stronger and more steady   OBJECTIVE:   COGNITION: Overall cognitive status: Within functional limits for tasks assessed   SENSATION: WFL  COORDINATION: Heel slide test: very challenging due to  fatigue   POSTURE: rounded shoulders  Vitals: Seated: 113/64 Standing 110/68    LOWER EXTREMITY  MMT:    MMT Right Eval Left Eval  Hip flexion 3+ 3+  Hip extension    Hip abduction 3+ 3+  Hip adduction 3+ 3+  Hip internal rotation    Hip external rotation    Knee flexion 4- 4-  Knee extension 3+ 4-  Ankle dorsiflexion 4- 4-  Ankle plantarflexion 4- 4-  Ankle inversion    Ankle eversion    (Blank rows = not tested)   TRANSFERS: Assistive device utilized: Single point cane  Sit to stand: SBA and CGA Stand to sit: SBA Chair to chair: CGA   GAIT: Gait pattern: step through pattern, decreased stride length, and narrow BOS Distance walked: 50 ft  Assistive device utilized:  hurrycane Level of assistance: CGA Comments: patient is challenged with quick steps   FUNCTIONAL TESTS:  5 times sit to stand: 22.71 10 meter walk test: 16.47 seconds with hurrycane  Berg Balance Scale: 28/56  PATIENT SURVEYS:  FOTO 40  TODAY'S TREATMENT: DATE: 10/18/22  Pt session was cut a little short today due to pt arriving late to scheduled appointment time  therex:   STS x10 with arms crossed, verbal and tactile cuing for proper form and pt was able to tolerate improved technique and endurance with less UE support.   Ambulation with 2.5# AW and with x 2 bouts of 20 ft retro walking ( total of 350 ft) without AD, occasional LOB corrected with step over pattern and without PT assistance although CGA provided throughout.    Standing mini squats 2x10 with BUE support with 2.5# AW donned    Seated hamstring curls with Blue  theraband resistance applied by therapist, 2x10  NMR:   Standing NBOS on airex x 45 seconds - balance game moving letters in same position  - 1/2 romberg stance on airex with balance game x 1-2 min ea LE     PATIENT EDUCATION: Education details: goals, plan, [employer] activities Pt educated throughout session about proper posture and technique with  exercises. Improved exercise technique, movement at target joints, use of target muscles after min to mod verbal, visual, tactile cues.  Person educated: Patient Education method: Explanation, Demonstration, Tactile cues, Verbal cues, and Handouts Education comprehension: verbalized understanding, returned demonstration, verbal cues required, and tactile cues required   HOME EXERCISE PROGRAM:  9/5: Access Code: [email or web address] URL: [email or web address] Date: 09/30/2022 Prepared by: Temple Pacini  Exercises - Mini Squat with Counter Support  - 1 x daily - 5 x weekly - 2 sets - 10 reps   Access Code: [email or web address] URL: [email or web address] Date: 07/14/2022 Prepared by: Precious Bard  Exercises - Seated Heel Toe Raises  - 1 x daily - 7 x weekly - 2 sets - 10 reps - 5 hold - Standing Tandem Balance with Counter Support  - 1 x daily - 7 x weekly - 2 sets - 2 reps - 30 hold - Standing March with Counter Support  - 1 x daily - 7 x weekly - 2 sets - 10 reps - 5 hold - Ankle Inversion Eversion Towel Slide  - 1 x daily - 7 x weekly - 1 sets - 15 reps - 5 hold  Access Code: [email or web address] URL: [email or web address] Date: 08/10/2022 Prepared by: Precious Bard  Exercises - Dead Bug with Swiss Ball  - 1 x daily - 7 x weekly - 2 sets - 10 reps - 5 hold - Seated Abdominal Press into Swiss Ball  - 1 x daily - 7 x weekly - 2 sets -  10 reps - 5 hold - Supine Hamstring Curl on Swiss Ball  - 1 x daily - 7 x weekly - 2 sets - 10 reps - 5 hold  GOALS: Goals reviewed with patient? Yes  SHORT TERM GOALS: Target date: 07/28/2022  Patient will be independent in home exercise program to improve strength/mobility for better functional independence with ADLs Baseline: 6/5: 7/16: intermittent compliance; 09/30/22: intermittent compliance, pt states "I'm not good at exercising. I'm really not." Goal status: Partially Met    LONG TERM GOALS: Target date: 09/22/2022  Patient will  increase FOTO score to equal to or greater than   47  to demonstrate statistically significant improvement in mobility and quality of life.  Baseline: 6/5: 40% 7/16: 56%; 09/30/22: 49% (some difficulty with understanding questions) Goal status:MET  2. Patient (> 35 years old) will complete five times sit to stand test in < 15 seconds indicating an increased LE strength and improved balance. Baseline: 6/5: 22.71 seconds with BUE support 7/16: 21 with no hands; 09/30/22: 20 sec hands-free Goal status: Partially Met   3.  Patient will increase 10 meter walk test to >1.53m/s as to improve gait speed for better community ambulation and to reduce fall risk. Baseline: 6/5: 16.47 seconds with hurrycane 7/16: 12 seconds no AD; 09/30/22 0.8 m/s no AD, 0.72 m/s with hurrycane  Goal status: Partially Met   4.   Patient will demonstrate an improved Berg Balance Score of > 38 as to demonstrate improved balance with ADLs such as sitting/standing and transfer balance and reduced fall risk.  Baseline: 6/5: 28/56 7/16: 39/56. 9/12:43/56 Goal status: MET    ASSESSMENT:  CLINICAL IMPRESSION:  Pt responded well to the exercises and was able to tolerate increased LE strengthening exercises.  Pt progressed with ambulation without AD and showed some unsteadiness with AW donned with this activity but good balance responses when necessary.   Pt session was cut a little short today due to pt arriving late to scheduled appointment time. Pt will continue to benefit from skilled therapy to address remaining deficits in order to improve overall QoL and return to PLOF.      OBJECTIVE IMPAIRMENTS: Abnormal gait, decreased activity tolerance, decreased balance, decreased coordination, decreased endurance, decreased mobility, difficulty walking, decreased strength, dizziness, impaired perceived functional ability, impaired flexibility, impaired vision/preception, and improper body mechanics.   ACTIVITY LIMITATIONS: carrying,  lifting, bending, standing, squatting, stairs, transfers, bed mobility, dressing, reach over head, locomotion level, and caring for others  PARTICIPATION LIMITATIONS: meal prep, cleaning, laundry, driving, shopping, community activity, and yard work  PERSONAL FACTORS: anemia, breast cancer 2023, diverticulosis of colon, Fuch's corneal dystrophy of R eye, GERD, bilateral hearing loss, HTN, HLD, macular degeneration of bilateral eyes, OA, OAB, osteopenia, urge incontinence.  are also affecting patient's functional outcome.   REHAB POTENTIAL: Good  CLINICAL DECISION MAKING: Evolving/moderate complexity  EVALUATION COMPLEXITY: Moderate  PLAN:  PT FREQUENCY: 1x/week  PT DURATION: 12 weeks  PLANNED INTERVENTIONS: Therapeutic exercises, Therapeutic activity, Neuromuscular re-education, Balance training, Gait training, Patient/Family education, Self Care, Joint mobilization, Stair training, Vestibular training, Canalith repositioning, Visual/preceptual remediation/compensation, DME instructions, Electrical stimulation, Cryotherapy, Moist heat, Manual lymph drainage, Splintting, Taping, Traction, Ultrasound, Manual therapy, and Re-evaluation  PLAN FOR NEXT SESSION:LE   Dynamic Balance core strength. Weighted gait. BLE strengthening    Nolon Bussing, PT, DPT Physical Therapist - [facility]  10/18/22, 12:55 PM

## 2022-10-19 ENCOUNTER — Ambulatory Visit: Payer: Medicare PPO

## 2022-10-20 DIAGNOSIS — L309 Dermatitis, unspecified: Secondary | ICD-10-CM | POA: Diagnosis not present

## 2022-10-20 DIAGNOSIS — L920 Granuloma annulare: Secondary | ICD-10-CM | POA: Diagnosis not present

## 2022-10-20 DIAGNOSIS — D2261 Melanocytic nevi of right upper limb, including shoulder: Secondary | ICD-10-CM | POA: Diagnosis not present

## 2022-10-20 DIAGNOSIS — D2272 Melanocytic nevi of left lower limb, including hip: Secondary | ICD-10-CM | POA: Diagnosis not present

## 2022-10-20 DIAGNOSIS — D2271 Melanocytic nevi of right lower limb, including hip: Secondary | ICD-10-CM | POA: Diagnosis not present

## 2022-10-20 DIAGNOSIS — L821 Other seborrheic keratosis: Secondary | ICD-10-CM | POA: Diagnosis not present

## 2022-10-20 DIAGNOSIS — D225 Melanocytic nevi of trunk: Secondary | ICD-10-CM | POA: Diagnosis not present

## 2022-10-20 DIAGNOSIS — D2262 Melanocytic nevi of left upper limb, including shoulder: Secondary | ICD-10-CM | POA: Diagnosis not present

## 2022-10-21 ENCOUNTER — Ambulatory Visit: Payer: Medicare PPO | Admitting: Pulmonary Disease

## 2022-10-21 ENCOUNTER — Ambulatory Visit: Payer: Medicare PPO | Admitting: Obstetrics & Gynecology

## 2022-10-21 ENCOUNTER — Encounter: Payer: Self-pay | Admitting: Pulmonary Disease

## 2022-10-21 ENCOUNTER — Ambulatory Visit: Payer: Medicare PPO

## 2022-10-21 VITALS — BP 132/74 | HR 73 | Temp 97.6°F | Ht 63.25 in | Wt 119.0 lb

## 2022-10-21 DIAGNOSIS — R0602 Shortness of breath: Secondary | ICD-10-CM

## 2022-10-21 MED ORDER — FLUTICASONE FUROATE-VILANTEROL 100-25 MCG/ACT IN AEPB
1.0000 | INHALATION_SPRAY | Freq: Every day | RESPIRATORY_TRACT | 3 refills | Status: DC
Start: 2022-10-21 — End: 2022-12-30

## 2022-10-21 NOTE — Progress Notes (Signed)
Synopsis: Referred in  by Judy Pimple, MD   Subjective:   PATIENT ID: Sierra Bentley GENDER: female DOB: 01-Nov-1933, MRN: [id number]  Chief Complaint  Patient presents with   Consult    Occasional Shortness of breath on exertion. Dry cough on and off during the day.     HPI Sierra Bentley is an 89 years old female patient with a past medical hisoty of essential hypertension presenting as a referral from her PCP for further work up of shortness of breath.   She reports for the past 2 month she started having shortness of breath on exertion. Associated with cough that is mostly dry. She reports that the dyspnea is intermittent and not present all the tim. She denies any chest pain/tightness/wheezing. She denies any weight loss or loss of appetite. She reports hypersensitivity to humidity and strong scents. She had covid last year.   FH - No family history of pulmonary diseases   SH - Never smoker - No alcohol use - No illicit drug use. Worked as a Runner, broadcasting/film/video. Has 5 dogs at home.   ROS All systems were reviewed and are negative except for the above.  Objective:   Vitals:   10/21/22 1128  BP: 132/74  Pulse: 73  Temp: 97.6 F (36.4 C)  TempSrc: Temporal  SpO2: 97%  Weight: 119 lb (54 kg)  Height: 5' 3.25" (1.607 m)   97% on RA BMI Readings from Last 3 Encounters:  10/21/22 20.91 kg/m  09/15/22 20.78 kg/m  05/20/22 20.76 kg/m   Wt Readings from Last 3 Encounters:  10/21/22 119 lb (54 kg)  09/15/22 118 lb 4 oz (53.6 kg)  05/20/22 118 lb 2 oz (53.6 kg)    Physical Exam GEN: NAD, Healthy Appearing HEENT: Supple Neck, Reactive Pupils, EOMI  CVS: Normal S1, Normal S2, RRR, No murmurs or ES appreciated  Lungs: Clear bilateral air entry.  Abdomen: Soft, non tender, non distended, + BS  Extremities: Warm and well perfused, No edema  Skin: No suspicious lesions appreciated  Psych: Normal Affect  Ancillary Information   CBC    Component Value Date/Time   WBC 5.5  09/15/2022 1610   RBC 4.56 09/15/2022 1610   HGB 12.7 09/15/2022 1610   HGB 12.1 03/18/2021 1213   HCT 39.6 09/15/2022 1610   PLT 245.0 09/15/2022 1610   PLT 244 03/18/2021 1213   MCV 86.7 09/15/2022 1610   MCH 27.9 03/18/2021 1213   MCHC 32.2 09/15/2022 1610   RDW 14.4 09/15/2022 1610   LYMPHSABS 1.0 09/15/2022 1610   MONOABS 0.5 09/15/2022 1610   EOSABS 0.1 09/15/2022 1610   BASOSABS 0.0 09/15/2022 1610    Imaging  CXR 08/2022 showed increased diffuse reticular opacities/Vascular prominence. With hyperinflation.      No data to display           Assessment & Plan:  Sierra Bentley is an 89 years old female patient with a past medical hisoty of essential hypertension presenting as a referral from her PCP for further work up of shortness of breath.   #Shorntness of breath likely in the setting of reactive airway disease/Adult onset asthma post covid infection. However, she does have reticular opacities on her CXR and hyperinflation without a history of smoking. To better characterize these findings, will obtain a chest CT.   []  Start Fluticasone-Vilanterol [Breo] 100 1 puff daily. Mouth care post use was advised.  []  Albuterol 2 puffs Q6H as needed  []  CT chest wo  contrast.  []  PFTs   Return in about 3 months (around 01/20/2023).  I spent 60 minutes caring for this patient today, including preparing to see the patient, obtaining a medical history , reviewing a separately obtained history, performing a medically appropriate examination and/or evaluation, counseling and educating the patient/family/caregiver, ordering medications, tests, or procedures, documenting clinical information in the electronic health record, and independently interpreting results (not separately reported/billed) and communicating results to the patient/family/caregiver  Janann Colonel, MD [facility] 10/21/2022 12:42 PM

## 2022-10-26 ENCOUNTER — Telehealth: Payer: Self-pay | Admitting: Pulmonary Disease

## 2022-10-26 ENCOUNTER — Ambulatory Visit: Payer: Medicare PPO | Admitting: Nurse Practitioner

## 2022-10-26 ENCOUNTER — Ambulatory Visit: Payer: Medicare PPO

## 2022-10-26 NOTE — Telephone Encounter (Signed)
Patient is calling back trying to return missed call.

## 2022-10-26 NOTE — Telephone Encounter (Signed)
Lm x1 for the patient.

## 2022-10-26 NOTE — Telephone Encounter (Signed)
Pt has serious concerns about the inhaler she was prescribed. Says she has a hx of nose bleeds and has had 2 members of her family die due to unfortunate blood loss accidents. She would like to speak with someone concerning this

## 2022-10-26 NOTE — Telephone Encounter (Signed)
I spoke with the patient. She said she has had nose bleeds all her life. She has had to have cauterizations before and was even told by one doctor that he could not do them to her anymore and she would have to go to the ED if she has another one.  She went to pick up the [facility] on Saturday and asked her pharmacist if there was anything in the [facility] that could cause her to have a nose bleed. Her pharmacist told her there were 3 ingredients in it that could cause nose bleeds. She wants to know what your thought are? She does not want it to cause a nose bleed and her have to go to the ED.

## 2022-10-28 ENCOUNTER — Ambulatory Visit: Payer: Medicare PPO | Attending: Family Medicine

## 2022-10-28 DIAGNOSIS — R2681 Unsteadiness on feet: Secondary | ICD-10-CM | POA: Diagnosis not present

## 2022-10-28 DIAGNOSIS — R262 Difficulty in walking, not elsewhere classified: Secondary | ICD-10-CM | POA: Diagnosis not present

## 2022-10-28 DIAGNOSIS — M6281 Muscle weakness (generalized): Secondary | ICD-10-CM | POA: Diagnosis not present

## 2022-10-28 NOTE — Therapy (Signed)
OUTPATIENT PHYSICAL THERAPY NEURO TREATMENT NOTE/RE-CERT/DISCHARGE  Patient Name: Sierra Bentley MRN: [id number] DOB:[birth year], 89 y.o., female Today's Date: 10/28/2022   PCP: Judy Pimple MD REFERRING PROVIDER: Judy Pimple MD   END OF SESSION:  PT End of Session - 10/28/22 0933     Visit Number 23    Number of Visits 31    Date for PT Re-Evaluation 12/23/22    Authorization Type 7/10    Progress Note Due on Visit 10    PT Start Time 0933    PT Stop Time 1003    PT Time Calculation (min) 30 min    Equipment Utilized During Treatment Gait belt    Activity Tolerance Patient tolerated treatment well    Behavior During Therapy WFL for tasks assessed/performed              Past Medical History:  Diagnosis Date   Allergic rhinitis, cause unspecified    Anemia    Breast cancer (HCC)    left breast IDC 2023   Chronically dry eyes, bilateral    Diverticulosis of colon    Endometrial polyp    Family history of prostate cancer 03/18/2021   Fuchs' corneal dystrophy of right eye    GERD (gastroesophageal reflux disease)    Hearing loss of both ears    pt stated has hearing aids but does not wear   History of epistaxis    per pt has had several cautization's for nose bleed   History of radiation therapy    05/20/21-06/10/21- Dr. Antony Blackbird   HTN (hypertension)    Hyperlipidemia    Macular degeneration of both eyes    followed @[facility]  by dr Demetrius Charity. mettu;   both age-related and right is with active choroidal neovascularization   OA (osteoarthritis)    OAB (overactive bladder)    Osteopenia    Thickened endometrium    Urge incontinence of urine    Wears glasses    Past Surgical History:  Procedure Laterality Date   BREAST BIOPSY Left 2011   fibrocystic change   BREAST LUMPECTOMY WITH RADIOACTIVE SEED LOCALIZATION Left 04/13/2021   Procedure: LEFT BREAST LUMPECTOMY WITH RADIOACTIVE SEED LOCALIZATION;  Surgeon: Emelia Loron, MD;  Location:  SURGERY  CENTER;  Service: General;  Laterality: Left;   CATARACT EXTRACTION W/ INTRAOCULAR LENS IMPLANT Bilateral 2008   COLONOSCOPY  2013   DILATATION & CURETTAGE/HYSTEROSCOPY WITH MYOSURE N/A 11/26/2020   Procedure: DILATATION & CURETTAGE/HYSTEROSCOPY WITH MYOSURE;  Surgeon: Genia Del, MD;  Location: [facility];  Service: Gynecology;  Laterality: N/A;   TONSILLECTOMY  1962   Patient Active Problem List   Diagnosis Date Noted   Fatigue 09/15/2022   History of fall 05/20/2022   Sacral pain 05/20/2022   SOB (shortness of breath) on exertion 05/20/2022   Dysuria 05/20/2022   Grief reaction 06/14/2021   General weakness 06/12/2021   Current use of proton pump inhibitor 03/19/2021   Family history of prostate cancer 03/18/2021   Malignant neoplasm of upper-outer quadrant of left breast in female, estrogen receptor negative (HCC) 03/17/2021   Poor balance 05/07/2019   Exudative age-related macular degeneration, right eye, with active choroidal neovascularization (HCC) 09/04/2018   History of hip fracture 02/27/2018   Pedal edema 09/28/2017   Prediabetes 11/30/2016   At moderate risk for fall 09/05/2015   Cervical spondylosis without myelopathy 08/05/2015   Routine general medical examination at a health care facility 08/27/2014   Colon cancer screening 08/27/2014  Encounter for Medicare annual wellness exam 07/25/2013   Varicosities of leg 05/23/2012   Dry eye syndrome 04/14/2012   Nonexudative senile macular degeneration of retina 04/14/2012   Fuchs' corneal dystrophy 04/14/2012   Pseudophakia 04/14/2012   Abnormal ultrasound of thyroid gland 03/01/2011   Leg cramps 05/27/2009   Hyperlipidemia 06/11/2008   Allergic rhinitis 03/29/2008   Essential hypertension, benign 12/26/2006   GERD 12/26/2006   Rheumatoid arthritis (HCC) 12/26/2006   Osteoporosis 12/26/2006   URINARY INCONTINENCE 12/26/2006    ONSET DATE: a few months per patient report   REFERRING DIAG:  poor balance, history of fall, moderate risk for fall   THERAPY DIAG:  Muscle weakness (generalized)  Difficulty in walking, not elsewhere classified  Unsteadiness on feet  Rationale for Evaluation and Treatment: Rehabilitation  SUBJECTIVE:                                                                                                                                                                                             SUBJECTIVE STATEMENT:   Pt notes that this would likely be her last visit as she would like to continue therapy at [facility] if possible.  Pt accompanied by: self  PERTINENT HISTORY:   Patient presents to physical therapy for balance and fall risk. Patient has been seen in the past at this clinic in 2021. PMH includes anemia, breast cancer 2023, diverticulosis of colon, Fuch's corneal dystrophy of R eye, GERD, bilateral hearing loss, HTN, HLD, macular degeneration of bilateral eyes, OA, OAB, osteopenia, urge incontinence. Patient had therapy a few months ago that was not helpful. Patient reports one fall about a month ago at home and another fall in [employer]; tripped on ramp and fell backwards.  Has dizziness with quick turns and leaning over.   PAIN:  Are you having pain?  Have headaches, affects balance None currently.  PRECAUTIONS: Fall  WEIGHT BEARING RESTRICTIONS: No  FALLS: Has patient fallen in last 6 months? Yes. Number of falls 2  LIVING ENVIRONMENT: Lives with: lives alone Lives in: House/apartment Stairs:  two levels, doesn't go upstairs, 4 stairs in garage, 2 in front, handrails can reach both Has following equipment at home: Quad cane small base and Walker - 2 wheeled  PLOF: Independent  PATIENT GOALS: to get stronger and more steady   OBJECTIVE:   COGNITION: Overall cognitive status: Within functional limits for tasks assessed   SENSATION: WFL  COORDINATION: Heel slide test: very challenging due to fatigue   POSTURE: rounded  shoulders  Vitals: Seated: 113/64 Standing 110/68    LOWER EXTREMITY MMT:    MMT Right  Eval Left Eval  Hip flexion 3+ 3+  Hip extension    Hip abduction 3+ 3+  Hip adduction 3+ 3+  Hip internal rotation    Hip external rotation    Knee flexion 4- 4-  Knee extension 3+ 4-  Ankle dorsiflexion 4- 4-  Ankle plantarflexion 4- 4-  Ankle inversion    Ankle eversion    (Blank rows = not tested)   TRANSFERS: Assistive device utilized: Single point cane  Sit to stand: SBA and CGA Stand to sit: SBA Chair to chair: CGA   GAIT: Gait pattern: step through pattern, decreased stride length, and narrow BOS Distance walked: 50 ft  Assistive device utilized:  hurrycane Level of assistance: CGA Comments: patient is challenged with quick steps   FUNCTIONAL TESTS:  5 times sit to stand: 22.71 10 meter walk test: 16.47 seconds with hurrycane  Berg Balance Scale: 28/56  PATIENT SURVEYS:  FOTO 40  TODAY'S TREATMENT: DATE: 10/28/22   TherAct:   Goal assessment as noted below:    PATIENT EDUCATION: Education details: goals, plan, [employer] activities Pt educated throughout session about proper posture and technique with exercises. Improved exercise technique, movement at target joints, use of target muscles after min to mod verbal, visual, tactile cues.  Person educated: Patient Education method: Explanation, Demonstration, Tactile cues, Verbal cues, and Handouts Education comprehension: verbalized understanding, returned demonstration, verbal cues required, and tactile cues required   HOME EXERCISE PROGRAM:  9/5: Access Code: [email or web address] URL: [email or web address] Date: 09/30/2022 Prepared by: Temple Pacini  Exercises - Mini Squat with Counter Support  - 1 x daily - 5 x weekly - 2 sets - 10 reps   Access Code: [email or web address] URL: [email or web address] Date: 07/14/2022 Prepared by: Precious Bard  Exercises - Seated Heel Toe Raises  - 1 x daily  - 7 x weekly - 2 sets - 10 reps - 5 hold - Standing Tandem Balance with Counter Support  - 1 x daily - 7 x weekly - 2 sets - 2 reps - 30 hold - Standing March with Counter Support  - 1 x daily - 7 x weekly - 2 sets - 10 reps - 5 hold - Ankle Inversion Eversion Towel Slide  - 1 x daily - 7 x weekly - 1 sets - 15 reps - 5 hold  Access Code: [email or web address] URL: [email or web address] Date: 08/10/2022 Prepared by: Precious Bard  Exercises - Dead Bug with Swiss Ball  - 1 x daily - 7 x weekly - 2 sets - 10 reps - 5 hold - Seated Abdominal Press into Swiss Ball  - 1 x daily - 7 x weekly - 2 sets - 10 reps - 5 hold - Supine Hamstring Curl on Swiss Ball  - 1 x daily - 7 x weekly - 2 sets - 10 reps - 5 hold  GOALS: Goals reviewed with patient? Yes  SHORT TERM GOALS: Target date: 07/28/2022  Patient will be independent in home exercise program to improve strength/mobility for better functional independence with ADLs Baseline: 6/5: 7/16: intermittent compliance; 09/30/22: intermittent compliance, pt states "I'm not good at exercising. I'm really not." Goal status: Partially Met    LONG TERM GOALS: Target date: 09/22/2022  Patient will increase FOTO score to equal to or greater than   47  to demonstrate statistically significant improvement in mobility and quality of life.  Baseline: 6/5: 40%  7/16: 56%;  09/30/22: 49% (some difficulty with understanding questions) 10/28/22: 58% Goal status:MET  2. Patient (> 44 years old)  will complete five times sit to stand test in < 15 seconds indicating an increased LE strength and improved balance. Baseline: 6/5: 22.71 seconds with BUE support  7/16: 21 with no hands;  09/30/22: 20 sec hands-free 10/28/22: 25.14 sec Goal status: Partially Met   3.  Patient will increase 10 meter walk test to >1.82m/s as to improve gait speed for better community ambulation and to reduce fall risk. Baseline: 6/5: 16.47 seconds with hurrycane 7/16: 12 seconds no AD;  09/30/22 0.8 m/s no AD, 0.72 m/s with hurrycane  10/28/22: 0.88 m/s with cane; 0.89 m/s no AD Goal status: PROGRESSING  4.   Patient will demonstrate an improved Berg Balance Score of > 38 as to demonstrate improved balance with ADLs such as sitting/standing and transfer balance and reduced fall risk.  Baseline: 6/5: 28/56 7/16: 39/56.  9/12: 43/56 Goal status: MET    ASSESSMENT:  CLINICAL IMPRESSION:  Pt has made consistent progress towards goals thus far into therapy.  Pt would like to continue therapy, but is interested in having therapy at [facility] due to inclement weather coming up and her not feeling comfortable driving in poor conditions.  Pt agreeable to discharge following this session and transitioning care to therapy located at her facility.  Pt advised to contact MD for new referral and contact this clinic if any other complications occur.  Pt is formally discharged at this time.    OBJECTIVE IMPAIRMENTS: Abnormal gait, decreased activity tolerance, decreased balance, decreased coordination, decreased endurance, decreased mobility, difficulty walking, decreased strength, dizziness, impaired perceived functional ability, impaired flexibility, impaired vision/preception, and improper body mechanics.   ACTIVITY LIMITATIONS: carrying, lifting, bending, standing, squatting, stairs, transfers, bed mobility, dressing, reach over head, locomotion level, and caring for others  PARTICIPATION LIMITATIONS: meal prep, cleaning, laundry, driving, shopping, community activity, and yard work  PERSONAL FACTORS: anemia, breast cancer 2023, diverticulosis of colon, Fuch's corneal dystrophy of R eye, GERD, bilateral hearing loss, HTN, HLD, macular degeneration of bilateral eyes, OA, OAB, osteopenia, urge incontinence.  are also affecting patient's functional outcome.   REHAB POTENTIAL: Good  CLINICAL DECISION MAKING: Evolving/moderate complexity  EVALUATION COMPLEXITY: Moderate  PLAN:  PT  FREQUENCY: 1x/week  PT DURATION: 12 weeks  PLANNED INTERVENTIONS: Therapeutic exercises, Therapeutic activity, Neuromuscular re-education, Balance training, Gait training, Patient/Family education, Self Care, Joint mobilization, Stair training, Vestibular training, Canalith repositioning, Visual/preceptual remediation/compensation, DME instructions, Electrical stimulation, Cryotherapy, Moist heat, Manual lymph drainage, Splintting, Taping, Traction, Ultrasound, Manual therapy, and Re-evaluation  PLAN FOR NEXT SESSION:LE   Pt is d/c at this time.    Phineas Real, PT, DPT Physical Therapist - [facility]  10/28/22, 10:11 AM

## 2022-10-28 NOTE — Telephone Encounter (Signed)
Dr. Larinda Buttery, please advise. Thanks

## 2022-10-29 NOTE — Telephone Encounter (Signed)
Noted  

## 2022-10-31 ENCOUNTER — Encounter: Payer: Self-pay | Admitting: Family Medicine

## 2022-11-01 ENCOUNTER — Telehealth (INDEPENDENT_AMBULATORY_CARE_PROVIDER_SITE_OTHER): Payer: Medicare PPO | Admitting: Family Medicine

## 2022-11-01 ENCOUNTER — Encounter: Payer: Self-pay | Admitting: Family Medicine

## 2022-11-01 ENCOUNTER — Ambulatory Visit: Payer: Medicare PPO | Admitting: Internal Medicine

## 2022-11-01 VITALS — Ht 63.25 in

## 2022-11-01 DIAGNOSIS — R051 Acute cough: Secondary | ICD-10-CM

## 2022-11-01 DIAGNOSIS — U071 COVID-19: Secondary | ICD-10-CM | POA: Diagnosis not present

## 2022-11-01 MED ORDER — NIRMATRELVIR/RITONAVIR (PAXLOVID)TABLET
3.0000 | ORAL_TABLET | Freq: Two times a day (BID) | ORAL | 0 refills | Status: AC
Start: 2022-11-01 — End: 2022-11-06

## 2022-11-01 MED ORDER — BENZONATATE 100 MG PO CAPS: 200.0000 mg | ORAL_CAPSULE | Freq: Two times a day (BID) | ORAL | 0 refills | Status: AC | PRN

## 2022-11-01 NOTE — Progress Notes (Signed)
Virtual Visit via Video Note I connected with Sierra Bentley on 11/01/2022 by a video enabled telemedicine application and verified that I am speaking with the correct person using two identifiers. Location patient: home Location provider:work office Persons participating in the virtual visit: patient, scribe, provider  I discussed the limitations of evaluation and management by telemedicine and the availability of in person appointments. The patient expressed understanding and agreed to proceed.  Chief Complaint  Patient presents with   Covid Positive    Tested positive yesterday.    HPI: [name] is an 89 y.o. female with a PMHx significant for HTN, HLD, osteoporosis, breast cancer,rheumatoid arthritis, and GERD who is being seen today on video with her grandson for a positive COVID test.   Her grandson states she started having symptoms late on 10/5 that worsened significantly by the morning of 10/6. Her symptoms included fever, fatigue,chills, decreased appetite, sore throat, rhinorrhea,nasal congestion, and post nasal drainage. Cough is not productive. Max temp: 100.4 F. She said these symptoms interfered with her sleep. She denies chest pain, SOB, wheezing, nausea, vomiting, diarrhea, abdominal pain, urinary symptoms,or skin rash.   She has been taking tylenol and Coricidin for her symptoms.  No history of liver or kidney disease. Lab Results  Component Value Date   NA 140 09/15/2022   CL 100 09/15/2022   K 4.1 09/15/2022   CO2 32 09/15/2022   BUN 15 09/15/2022   CREATININE 0.73 09/15/2022   GFR 74.30 09/15/2022   CALCIUM 9.5 09/15/2022   PHOS 4.2 05/27/2009   ALBUMIN 4.2 09/15/2022   GLUCOSE 110 (H) 09/15/2022   Lab Results  Component Value Date   ALT 15 09/15/2022   AST 16 09/15/2022   ALKPHOS 43 09/15/2022   BILITOT 0.3 09/15/2022   ROS: See pertinent positives and negatives per HPI.  Past Medical History:  Diagnosis Date   Allergic rhinitis, cause  unspecified    Anemia    Breast cancer (HCC)    left breast IDC 2023   Chronically dry eyes, bilateral    Diverticulosis of colon    Endometrial polyp    Family history of prostate cancer 03/18/2021   Fuchs' corneal dystrophy of right eye    GERD (gastroesophageal reflux disease)    Hearing loss of both ears    pt stated has hearing aids but does not wear   History of epistaxis    per pt has had several cautization's for nose bleed   History of radiation therapy    05/20/21-06/10/21- Dr. Antony Blackbird   HTN (hypertension)    Hyperlipidemia    Macular degeneration of both eyes    followed @[facility]  by dr Demetrius Charity. mettu;   both age-related and right is with active choroidal neovascularization   OA (osteoarthritis)    OAB (overactive bladder)    Osteopenia    Thickened endometrium    Urge incontinence of urine    Wears glasses     Past Surgical History:  Procedure Laterality Date   BREAST BIOPSY Left 2011   fibrocystic change   BREAST LUMPECTOMY WITH RADIOACTIVE SEED LOCALIZATION Left 04/13/2021   Procedure: LEFT BREAST LUMPECTOMY WITH RADIOACTIVE SEED LOCALIZATION;  Surgeon: Emelia Loron, MD;  Location: [facility];  Service: General;  Laterality: Left;   CATARACT EXTRACTION W/ INTRAOCULAR LENS IMPLANT Bilateral 2008   COLONOSCOPY  2013   DILATATION & CURETTAGE/HYSTEROSCOPY WITH MYOSURE N/A 11/26/2020   Procedure: DILATATION & CURETTAGE/HYSTEROSCOPY WITH MYOSURE;  Surgeon: Genia Del, MD;  Location: [facility];  Service: Gynecology;  Laterality: N/A;   TONSILLECTOMY  1962    Family History  Problem Relation Age of Onset   Other Mother        Arrythmia   Coronary artery disease Mother    Hypertension Mother    Anxiety disorder Mother    Heart attack Father 81   Coronary artery disease Father    Prostate cancer Father        dx unknown age   Throat cancer Brother        d. 80   Cancer Other        two maternal female cousins; unknown  cancer; dx after 65   Depression Other        family history    Social History   Socioeconomic History   Marital status: Widowed    Spouse name: Not on file   Number of children: Not on file   Years of education: Not on file   Highest education level: Not on file  Occupational History   Not on file  Tobacco Use   Smoking status: Never    Passive exposure: Never   Smokeless tobacco: Never  Vaping Use   Vaping status: Never Used  Substance and Sexual Activity   Alcohol use: No    Alcohol/week: 0.0 standard drinks of alcohol   Drug use: Never   Sexual activity: Not Currently    Partners: Male    Birth control/protection: Post-menopausal  Other Topics Concern   Not on file  Social History Narrative   Married      No biological children (Has step children)      Taught HS x 30 years      Regular exercise         Social Determinants of Health   Financial Resource Strain: Low Risk  (12/23/2021)   Overall Financial Resource Strain (CARDIA)    Difficulty of Paying Living Expenses: Not hard at all  Food Insecurity: No Food Insecurity (12/23/2021)   Hunger Vital Sign    Worried About Running Out of Food in the Last Year: Never true    Ran Out of Food in the Last Year: Never true  Transportation Needs: No Transportation Needs (12/23/2021)   PRAPARE - Administrator, Civil Service (Medical): No    Lack of Transportation (Non-Medical): No  Physical Activity: Inactive (12/23/2021)   Exercise Vital Sign    Days of Exercise per Week: 0 days    Minutes of Exercise per Session: 0 min  Stress: No Stress Concern Present (12/23/2021)   [employer] of Occupational Health - Occupational Stress Questionnaire    Feeling of Stress : Only a little  Social Connections: Moderately Isolated (12/23/2021)   Social Connection and Isolation Panel [NHANES]    Frequency of Communication with Friends and Family: Three times a week    Frequency of Social Gatherings with  Friends and Family: Three times a week    Attends Religious Services: More than 4 times per year    Active Member of Clubs or Organizations: No    Attends Banker Meetings: Never    Marital Status: Widowed  Intimate Partner Violence: Not At Risk (12/23/2021)   Humiliation, Afraid, Rape, and Kick questionnaire    Fear of Current or Ex-Partner: No    Emotionally Abused: No    Physically Abused: No    Sexually Abused: No     Current Outpatient Medications:  benzonatate (TESSALON) 100 MG capsule, Take 2 capsules (200 mg total) by mouth 2 (two) times daily as needed for up to 10 days., Disp: 30 capsule, Rfl: 0   Carboxymethylcellulose Sodium (THERATEARS) 0.25 % SOLN, Apply 1 drop to eye daily as needed., Disp: , Rfl:    fluticasone furoate-vilanterol (BREO ELLIPTA) 100-25 MCG/ACT AEPB, Inhale 1 puff into the lungs daily., Disp: 1 each, Rfl: 3   losartan-hydrochlorothiazide (HYZAAR) 50-12.5 MG tablet, Take 0.5 tablets by mouth every evening., Disp: 45 tablet, Rfl: 3   Multiple Vitamin (MULTIVITAMIN) tablet, Take 1 tablet by mouth daily. Per pt has Postmenopausal multivitamin packet with 5 pills, Disp: , Rfl:    nirmatrelvir/ritonavir (PAXLOVID) 20 x 150 MG & 10 x 100MG  TABS, Take 3 tablets by mouth 2 (two) times daily for 5 days. (Take nirmatrelvir 150 mg two tablets twice daily for 5 days and ritonavir 100 mg one tablet twice daily for 5 days) Patient GFR is 74 on 09/15/22., Disp: 30 tablet, Rfl: 0   pantoprazole (PROTONIX) 20 MG tablet, Take 1 tablet (20 mg total) by mouth daily., Disp: 90 tablet, Rfl: 3   Polyethyl Glycol-Propyl Glycol (SYSTANE ULTRA OP), Place 1 drop into both eyes 4 (four) times daily., Disp: , Rfl:    Probiotic Product (ALIGN EXTRA STRENGTH PO), Take by mouth., Disp: , Rfl:    sodium chloride (MURO 128) 5 % ophthalmic ointment, Place 1 application into both eyes at bedtime. , Disp: , Rfl:    TURMERIC PO, Take 1,300 mg by mouth in the morning and at bedtime.,  Disp: , Rfl:   EXAM:  VITALS per patient if applicable:Ht 5' 3.25" (1.607 m)   BMI 20.91 kg/m   GENERAL: alert, oriented, appears well and in no acute distress  HEENT: atraumatic, conjunctiva clear, no obvious abnormalities on inspection of external nose and ears  NECK: normal movements of the head and neck  LUNGS: on inspection no signs of respiratory distress, breathing rate appears normal, no obvious gross SOB, gasping or wheezing  CV: no obvious cyanosis  MS: moves all visible extremities without noticeable abnormality  PSYCH/NEURO: pleasant and cooperative, no obvious depression or anxiety, speech and thought processing grossly intact  ASSESSMENT AND PLAN:  Discussed the following assessment and plan:  COVID-19 virus infection - Plan: nirmatrelvir/ritonavir (PAXLOVID) 20 x 150 MG & 10 x 100MG  TABS We discussed Dx,possible complications and treatment options. She has a mild to moderate case with risk for complications. We discussed oral antiviral options and side effects. She agrees with starting Paxlovid. Symptomatic treatment with plenty of fluids,rest,tylenol 500 mg 3-4 times per day prn. Throat lozenges if needed for sore throat. In regard to quarantine, she lives in a independent living facility, recommend 5 days as far as fever has resolved and other symptoms have greatly improved. Clearly instructed about warning signs.  Acute cough - Plan: benzonatate (TESSALON) 100 MG capsule Explained that cough and congestion may last a few more days and even weeks after acute symptoms have resolved. Benzonatate recommended for symptom management as well as adequate hydration. If cough becomes more productive, plain Mucinex may help.  We discussed possible serious and likely etiologies, options for evaluation and workup, limitations of telemedicine visit vs in person visit, treatment, treatment risks and precautions. The patient was advised to call back or seek an in-person  evaluation if the symptoms worsen or if the condition fails to improve as anticipated. I discussed the assessment and treatment plan with the patient. The patient was provided  an opportunity to ask questions and all were answered. The patient agreed with the plan and demonstrated an understanding of the instructions.  I, Rolla Etienne Wierda, acting as a scribe for Detavious Rinn [place], MD., have documented all relevant documentation on the behalf of Afton Lavalle [place], MD, as directed by  Sina Lucchesi [place], MD while in the presence of Sanjiv Castorena [place], MD.   I, Alinda Egolf [place], MD, have reviewed all documentation for this visit. The documentation on 11/01/22 for the exam, diagnosis, procedures, and orders are all accurate and complete.  Return if symptoms worsen or fail to improve.  Mackenzy Eisenberg [place], MD

## 2022-11-02 ENCOUNTER — Ambulatory Visit: Payer: Medicare PPO

## 2022-11-04 ENCOUNTER — Ambulatory Visit: Payer: Medicare PPO

## 2022-11-09 ENCOUNTER — Ambulatory Visit: Payer: Medicare PPO

## 2022-11-10 ENCOUNTER — Ambulatory Visit: Payer: Medicare PPO

## 2022-11-11 ENCOUNTER — Ambulatory Visit: Payer: Medicare PPO

## 2022-11-15 DIAGNOSIS — H353231 Exudative age-related macular degeneration, bilateral, with active choroidal neovascularization: Secondary | ICD-10-CM | POA: Diagnosis not present

## 2022-11-15 DIAGNOSIS — H40003 Preglaucoma, unspecified, bilateral: Secondary | ICD-10-CM | POA: Diagnosis not present

## 2022-11-15 DIAGNOSIS — H353123 Nonexudative age-related macular degeneration, left eye, advanced atrophic without subfoveal involvement: Secondary | ICD-10-CM | POA: Diagnosis not present

## 2022-11-15 DIAGNOSIS — H18519 Endothelial corneal dystrophy, unspecified eye: Secondary | ICD-10-CM | POA: Diagnosis not present

## 2022-11-19 ENCOUNTER — Ambulatory Visit
Admission: RE | Admit: 2022-11-19 | Discharge: 2022-11-19 | Disposition: A | Discharge status: Home / Self Care | Payer: Medicare PPO | Source: Ambulatory Visit | Attending: Pulmonary Disease | Admitting: Pulmonary Disease

## 2022-11-19 DIAGNOSIS — R0602 Shortness of breath: Secondary | ICD-10-CM | POA: Insufficient documentation

## 2022-11-24 ENCOUNTER — Ambulatory Visit: Payer: Medicare PPO

## 2022-11-24 ENCOUNTER — Encounter: Payer: Medicare PPO | Admitting: Nurse Practitioner

## 2022-11-24 DIAGNOSIS — R296 Repeated falls: Secondary | ICD-10-CM | POA: Diagnosis not present

## 2022-11-24 DIAGNOSIS — R2689 Other abnormalities of gait and mobility: Secondary | ICD-10-CM | POA: Diagnosis not present

## 2022-11-24 DIAGNOSIS — R2681 Unsteadiness on feet: Secondary | ICD-10-CM | POA: Diagnosis not present

## 2022-11-26 DIAGNOSIS — R2681 Unsteadiness on feet: Secondary | ICD-10-CM | POA: Diagnosis not present

## 2022-11-26 DIAGNOSIS — R296 Repeated falls: Secondary | ICD-10-CM | POA: Diagnosis not present

## 2022-11-29 DIAGNOSIS — R2689 Other abnormalities of gait and mobility: Secondary | ICD-10-CM | POA: Diagnosis not present

## 2022-11-29 DIAGNOSIS — R296 Repeated falls: Secondary | ICD-10-CM | POA: Diagnosis not present

## 2022-11-29 DIAGNOSIS — R2681 Unsteadiness on feet: Secondary | ICD-10-CM | POA: Diagnosis not present

## 2022-11-30 DIAGNOSIS — R296 Repeated falls: Secondary | ICD-10-CM | POA: Diagnosis not present

## 2022-11-30 DIAGNOSIS — R2689 Other abnormalities of gait and mobility: Secondary | ICD-10-CM | POA: Diagnosis not present

## 2022-11-30 DIAGNOSIS — R2681 Unsteadiness on feet: Secondary | ICD-10-CM | POA: Diagnosis not present

## 2022-12-02 ENCOUNTER — Telehealth: Payer: Self-pay | Admitting: Pulmonary Disease

## 2022-12-02 NOTE — Telephone Encounter (Signed)
CT scan was on 10/25. Can you advise?

## 2022-12-02 NOTE — Telephone Encounter (Signed)
Please call w/CT results when avail.   [phone] is her #.

## 2022-12-06 DIAGNOSIS — R296 Repeated falls: Secondary | ICD-10-CM | POA: Diagnosis not present

## 2022-12-06 DIAGNOSIS — R2689 Other abnormalities of gait and mobility: Secondary | ICD-10-CM | POA: Diagnosis not present

## 2022-12-06 DIAGNOSIS — R2681 Unsteadiness on feet: Secondary | ICD-10-CM | POA: Diagnosis not present

## 2022-12-07 DIAGNOSIS — R296 Repeated falls: Secondary | ICD-10-CM | POA: Diagnosis not present

## 2022-12-07 DIAGNOSIS — R2689 Other abnormalities of gait and mobility: Secondary | ICD-10-CM | POA: Diagnosis not present

## 2022-12-07 DIAGNOSIS — R2681 Unsteadiness on feet: Secondary | ICD-10-CM | POA: Diagnosis not present

## 2022-12-08 ENCOUNTER — Ambulatory Visit: Payer: Medicare PPO

## 2022-12-13 DIAGNOSIS — R296 Repeated falls: Secondary | ICD-10-CM | POA: Diagnosis not present

## 2022-12-13 DIAGNOSIS — R2681 Unsteadiness on feet: Secondary | ICD-10-CM | POA: Diagnosis not present

## 2022-12-13 DIAGNOSIS — R2689 Other abnormalities of gait and mobility: Secondary | ICD-10-CM | POA: Diagnosis not present

## 2022-12-14 DIAGNOSIS — R2681 Unsteadiness on feet: Secondary | ICD-10-CM | POA: Diagnosis not present

## 2022-12-14 DIAGNOSIS — R296 Repeated falls: Secondary | ICD-10-CM | POA: Diagnosis not present

## 2022-12-14 DIAGNOSIS — R2689 Other abnormalities of gait and mobility: Secondary | ICD-10-CM | POA: Diagnosis not present

## 2022-12-15 ENCOUNTER — Ambulatory Visit: Payer: Medicare PPO

## 2022-12-20 DIAGNOSIS — R2681 Unsteadiness on feet: Secondary | ICD-10-CM | POA: Diagnosis not present

## 2022-12-20 DIAGNOSIS — R296 Repeated falls: Secondary | ICD-10-CM | POA: Diagnosis not present

## 2022-12-20 DIAGNOSIS — R2689 Other abnormalities of gait and mobility: Secondary | ICD-10-CM | POA: Diagnosis not present

## 2022-12-21 ENCOUNTER — Ambulatory Visit: Payer: Medicare PPO

## 2022-12-21 DIAGNOSIS — H353231 Exudative age-related macular degeneration, bilateral, with active choroidal neovascularization: Secondary | ICD-10-CM | POA: Diagnosis not present

## 2022-12-21 DIAGNOSIS — Z961 Presence of intraocular lens: Secondary | ICD-10-CM | POA: Diagnosis not present

## 2022-12-22 DIAGNOSIS — R2689 Other abnormalities of gait and mobility: Secondary | ICD-10-CM | POA: Diagnosis not present

## 2022-12-22 DIAGNOSIS — R2681 Unsteadiness on feet: Secondary | ICD-10-CM | POA: Diagnosis not present

## 2022-12-22 DIAGNOSIS — R296 Repeated falls: Secondary | ICD-10-CM | POA: Diagnosis not present

## 2022-12-27 ENCOUNTER — Ambulatory Visit (INDEPENDENT_AMBULATORY_CARE_PROVIDER_SITE_OTHER): Payer: Medicare PPO

## 2022-12-27 VITALS — Ht 63.25 in | Wt 119.0 lb

## 2022-12-27 DIAGNOSIS — Z0000 Encounter for general adult medical examination without abnormal findings: Secondary | ICD-10-CM | POA: Diagnosis not present

## 2022-12-27 DIAGNOSIS — R2689 Other abnormalities of gait and mobility: Secondary | ICD-10-CM | POA: Diagnosis not present

## 2022-12-27 DIAGNOSIS — R2681 Unsteadiness on feet: Secondary | ICD-10-CM | POA: Diagnosis not present

## 2022-12-27 DIAGNOSIS — R296 Repeated falls: Secondary | ICD-10-CM | POA: Diagnosis not present

## 2022-12-27 NOTE — Patient Instructions (Signed)
[name] , Thank you for taking time to come for your Medicare Wellness Visit. I appreciate your ongoing commitment to your health goals. Please review the following plan we discussed and let me know if I can assist you in the future.   Referrals/Orders/Follow-Ups/Clinician Recommendations: none  This is a list of the screening recommended for you and due dates:  Health Maintenance  Topic Date Due   COVID-19 Vaccine (3 - Moderna risk series) 05/08/2019   Mammogram  03/10/2022   Flu Shot  08/26/2022   Medicare Annual Wellness Visit  12/27/2023   DTaP/Tdap/Td vaccine (3 - Tdap) 05/18/2026   Pneumonia Vaccine  Completed   DEXA scan (bone density measurement)  Completed   Zoster (Shingles) Vaccine  Completed   HPV Vaccine  Aged Out    Advanced directives: (Copy Requested) Please bring a copy of your health care power of attorney and living will to the office to be added to your chart at your convenience.  Next Medicare Annual Wellness Visit scheduled for next year: Yes 12/28/2023 @ 11:30am telephone

## 2022-12-27 NOTE — Progress Notes (Signed)
Subjective:   Sierra Bentley is a 89 y.o. female who presents for Medicare Annual (Subsequent) preventive examination.  Visit Complete: Virtual I connected with  Sierra Bentley on 12/27/22 by a audio enabled telemedicine application and verified that I am speaking with the correct person using two identifiers.  Patient Location: Home  Provider Location: Office/Clinic  I discussed the limitations of evaluation and management by telemedicine. The patient expressed understanding and agreed to proceed.  Vital Signs: Because this visit was a virtual/telehealth visit, some criteria may be missing or patient reported. Any vitals not documented were not able to be obtained and vitals that have been documented are patient reported.  Patient Medicare AWV questionnaire was completed by the patient on (not done); I have confirmed that all information answered by patient is correct and no changes since this date.  Cardiac Risk Factors include: advanced age (>33men, >69 women);dyslipidemia;hypertension    Objective:    Today's Vitals   12/27/22 1054  Weight: 119 lb (54 kg)  Height: 5' 3.25" (1.607 m)   Body mass index is 20.91 kg/m.     12/27/2022   11:15 AM 06/30/2022   11:09 AM 12/23/2021   10:40 AM 07/13/2021    3:59 PM 05/11/2021   12:37 PM 04/13/2021    8:13 AM 04/01/2021    1:02 PM  Advanced Directives  Does Patient Have a Medical Advance Directive? Yes No No Yes Yes Yes Yes  Type of Estate agent of [place];Living will    Healthcare Power of [place];Living will  Healthcare Power of [place];Living will  Does patient want to make changes to medical advance directive?     No - Patient declined    Copy of Healthcare Power of Attorney in Chart? No - copy requested    No - copy requested    Would patient like information on creating a medical advance directive?   No - Patient declined No - Patient declined  No - Patient declined     Current Medications  (verified) Outpatient Encounter Medications as of 12/27/2022  Medication Sig   Carboxymethylcellulose Sodium (THERATEARS) 0.25 % SOLN Apply 1 drop to eye daily as needed.   fluticasone furoate-vilanterol (BREO ELLIPTA) 100-25 MCG/ACT AEPB Inhale 1 puff into the lungs daily.   latanoprost (XALATAN) 0.005 % ophthalmic solution SMARTSIG:In Eye(s)   losartan-hydrochlorothiazide (HYZAAR) 50-12.5 MG tablet Take 0.5 tablets by mouth every evening.   Multiple Vitamin (MULTIVITAMIN) tablet Take 1 tablet by mouth daily. Per pt has Postmenopausal multivitamin packet with 5 pills   pantoprazole (PROTONIX) 20 MG tablet Take 1 tablet (20 mg total) by mouth daily.   Polyethyl Glycol-Propyl Glycol (SYSTANE ULTRA OP) Place 1 drop into both eyes 4 (four) times daily.   Probiotic Product (ALIGN EXTRA STRENGTH PO) Take by mouth.   sodium chloride (MURO 128) 5 % ophthalmic ointment Place 1 application into both eyes at bedtime.    TURMERIC PO Take 1,300 mg by mouth in the morning and at bedtime.   [DISCONTINUED] latanoprost (XALATAN) 0.005 % ophthalmic solution 1 drop at bedtime.   [DISCONTINUED] losartan-hydrochlorothiazide (HYZAAR) 50-12.5 MG tablet Take 0.5 tablets by mouth every evening.   [DISCONTINUED] pantoprazole (PROTONIX) 40 MG tablet Take 1 tablet (40 mg total) by mouth daily as needed. Per pt when needed takes in the evening   [DISCONTINUED] sodium chloride (MURO 128) 5 % ophthalmic solution 1 drop as needed for eye irritation.   No facility-administered encounter medications on file as of 12/27/2022.  Allergies (verified) Ace inhibitors, Boniva [ibandronic acid], Chocolate, Fosamax [alendronate], and Oxycodone   History: Past Medical History:  Diagnosis Date   Allergic rhinitis, cause unspecified    Anemia    Breast cancer (HCC)    left breast IDC 2023   Chronically dry eyes, bilateral    Diverticulosis of colon    Endometrial polyp    Family history of prostate cancer 03/18/2021   Fuchs'  corneal dystrophy of right eye    GERD (gastroesophageal reflux disease)    Hearing loss of both ears    pt stated has hearing aids but does not wear   History of epistaxis    per pt has had several cautization's for nose bleed   History of radiation therapy    05/20/21-06/10/21- Dr. Antony Blackbird   HTN (hypertension)    Hyperlipidemia    Macular degeneration of both eyes    followed @[facility]  by dr Demetrius Charity. mettu;   both age-related and right is with active choroidal neovascularization   OA (osteoarthritis)    OAB (overactive bladder)    Osteopenia    Thickened endometrium    Urge incontinence of urine    Wears glasses    Past Surgical History:  Procedure Laterality Date   BREAST BIOPSY Left 2011   fibrocystic change   BREAST LUMPECTOMY WITH RADIOACTIVE SEED LOCALIZATION Left 04/13/2021   Procedure: LEFT BREAST LUMPECTOMY WITH RADIOACTIVE SEED LOCALIZATION;  Surgeon: Emelia Loron, MD;  Location: [facility];  Service: General;  Laterality: Left;   CATARACT EXTRACTION W/ INTRAOCULAR LENS IMPLANT Bilateral 2008   COLONOSCOPY  2013   DILATATION & CURETTAGE/HYSTEROSCOPY WITH MYOSURE N/A 11/26/2020   Procedure: DILATATION & CURETTAGE/HYSTEROSCOPY WITH MYOSURE;  Surgeon: Genia Del, MD;  Location: [facility];  Service: Gynecology;  Laterality: N/A;   TONSILLECTOMY  1962   Family History  Problem Relation Age of Onset   Other Mother        Arrythmia   Coronary artery disease Mother    Hypertension Mother    Anxiety disorder Mother    Heart attack Father 82   Coronary artery disease Father    Prostate cancer Father        dx unknown age   Throat cancer Brother        d. 70   Cancer Other        two maternal female cousins; unknown cancer; dx after 99   Depression Other        family history   Social History   Socioeconomic History   Marital status: Widowed    Spouse name: Not on file   Number of children: Not on file   Years of education:  Not on file   Highest education level: Not on file  Occupational History   Not on file  Tobacco Use   Smoking status: Never    Passive exposure: Never   Smokeless tobacco: Never  Vaping Use   Vaping status: Never Used  Substance and Sexual Activity   Alcohol use: No    Alcohol/week: 0.0 standard drinks of alcohol   Drug use: Never   Sexual activity: Not Currently    Partners: Male    Birth control/protection: Post-menopausal  Other Topics Concern   Not on file  Social History Narrative   Married      No biological children (Has step children)      Taught HS x 30 years      Regular exercise  Social Determinants of Health   Financial Resource Strain: Low Risk  (12/27/2022)   Overall Financial Resource Strain (CARDIA)    Difficulty of Paying Living Expenses: Not hard at all  Food Insecurity: No Food Insecurity (12/27/2022)   Hunger Vital Sign    Worried About Running Out of Food in the Last Year: Never true    Ran Out of Food in the Last Year: Never true  Transportation Needs: No Transportation Needs (12/27/2022)   PRAPARE - Administrator, Civil Service (Medical): No    Lack of Transportation (Non-Medical): No  Physical Activity: Insufficiently Active (12/27/2022)   Exercise Vital Sign    Days of Exercise per Week: 4 days    Minutes of Exercise per Session: 30 min  Stress: No Stress Concern Present (12/27/2022)   [employer] of Occupational Health - Occupational Stress Questionnaire    Feeling of Stress : Only a little  Social Connections: Moderately Isolated (12/27/2022)   Social Connection and Isolation Panel [NHANES]    Frequency of Communication with Friends and Family: Three times a week    Frequency of Social Gatherings with Friends and Family: Three times a week    Attends Religious Services: More than 4 times per year    Active Member of Clubs or Organizations: No    Attends Banker Meetings: Never    Marital Status:  Widowed    Tobacco Counseling Counseling given: Not Answered   Clinical Intake:  Pre-visit preparation completed: No  Pain : No/denies pain     BMI - recorded: 20.91 Nutritional Status: BMI of 19-24  Normal Nutritional Risks: None Diabetes: No  How often do you need to have someone help you when you read instructions, pamphlets, or other written materials from your doctor or pharmacy?: 1 - Never  Interpreter Needed?: No  Comments: lives at [facility] temporarily due to fixing water leak but normally lives alone Information entered by :: B.Cherlynn Popiel,LPN   Activities of Daily Living    12/27/2022   11:16 AM  In your present state of health, do you have any difficulty performing the following activities:  Hearing? 0  Vision? 0  Difficulty concentrating or making decisions? 1  Comment concentrating  Walking or climbing stairs? 1  Dressing or bathing? 0  Doing errands, shopping? 0  Preparing Food and eating ? N  Using the Toilet? N  In the past six months, have you accidently leaked urine? Y  Do you have problems with loss of bowel control? N  Managing your Medications? N  Managing your Finances? N  Housekeeping or managing your Housekeeping? Y    Patient Care Team: Tower, Audrie Gallus, MD as PCP - General Linus Salmons, MD as Referring Physician (Otolaryngology) Lilyan Punt, MD as Referring Physician (Ophthalmology) Emelia Loron, MD as Consulting Physician (General Surgery) Serena Croissant, MD as Consulting Physician (Hematology and Oncology) Antony Blackbird, MD as Consulting Physician (Radiation Oncology)  Indicate any recent Medical Services you may have received from other than [facility] providers in the past year (date may be approximate).     Assessment:   This is a routine wellness examination for Sierra Bentley.  Hearing/Vision screen Hearing Screening - Comments:: Pt says she has two hearing aids that work okay Vision Screening - Comments:: Pt says  her vision is ok and has readers and for driving  [employer]    Goals Addressed             This Visit's Progress  DIET - EAT MORE FRUITS AND VEGETABLES   Not on track    Increase water intake   Not on track    Starting 11/29/2017, I will attempt to drink at least 6-8 glasses of water daily.      COMPLETED: Patient Stated   On track    12/11/2019, I will maintain and continue medications as prescribed.      Patient Stated   Not on track    Would like to drink more water       Depression Screen    12/27/2022   11:10 AM 12/23/2021   10:37 AM 12/16/2020    2:51 PM 12/11/2019    3:38 PM 12/08/2018    9:37 AM 11/29/2017   10:06 AM 11/26/2016   11:34 AM  PHQ 2/9 Scores  PHQ - 2 Score 0 0 0 0 0 1 0  PHQ- 9 Score  0  0  2 0    Fall Risk    12/27/2022   10:59 AM 12/23/2021   10:41 AM 12/16/2020    2:50 PM 12/11/2019    3:37 PM 12/08/2018    9:37 AM  Fall Risk   Falls in the past year? 1 1 1 1 1   Number falls in past yr: 0 0 0 0 1  Injury with Fall? 1 0 0 0 1  Comment injuried left elbow:has healed up well      Risk for fall due to : Impaired balance/gait History of fall(s) Impaired balance/gait Medication side effect   Follow up Education provided;Falls prevention discussed Falls prevention discussed;Falls evaluation completed Falls prevention discussed Falls evaluation completed;Falls prevention discussed Falls evaluation completed    MEDICARE RISK AT HOME: Medicare Risk at Home Any stairs in or around the home?: Yes If so, are there any without handrails?: Yes Home free of loose throw rugs in walkways, pet beds, electrical cords, etc?: Yes Adequate lighting in your home to reduce risk of falls?: Yes Life alert?: Yes (does not wear) Use of a cane, walker or w/c?: Yes (cane) Grab bars in the bathroom?: Yes Shower chair or bench in shower?: Yes Elevated toilet seat or a handicapped toilet?: Yes  TIMED UP AND GO:  Was the test performed?  No    Cognitive  Function:    12/11/2019    3:42 PM 11/29/2017   10:06 AM 11/26/2016   11:32 AM 08/29/2015    8:50 AM  MMSE - Mini Mental State Exam  Orientation to time 5 5 5 5   Orientation to Place 5 5 5 5   Registration 3 3 3 3   Attention/ Calculation 5 0 0 0  Recall 3 3 3 2   Recall-comments    pt was unable to recall 1 of 3 words  Language- name 2 objects  0 0 0  Language- repeat 1 1 1 1   Language- follow 3 step command  3 3 3   Language- read & follow direction  0 0 0  Write a sentence  0 0 0  Copy design  0 0 0  Total score  20 20 19         12/27/2022   11:19 AM 12/23/2021   10:44 AM  6CIT Screen  What Year? 0 points 0 points  What month? 0 points 0 points  What time? 0 points 0 points  Count back from 20 0 points 0 points  Months in reverse 0 points 0 points  Repeat phrase 0 points 0 points  Total Score 0 points  0 points    Immunizations Immunization History  Administered Date(s) Administered   Fluad Quad(high Dose 65+) 11/14/2018, 12/17/2019, 10/30/2021   Influenza Split 11/30/2010, 12/16/2011   Influenza Whole 10/26/2007   Influenza,inj,Quad PF,6+ Mos 11/09/2012, 11/16/2013, 01/03/2015, 11/06/2015, 11/26/2016, 12/02/2017   Influenza-Unspecified 12/31/2020   Moderna Sars-Covid-2 Vaccination 03/13/2019, 04/10/2019   Pneumococcal Conjugate-13 07/25/2013   Pneumococcal Polysaccharide-23 11/30/2010   Td 01/25/2005, 05/17/2016   Zoster Recombinant(Shingrix) 09/03/2020, 07/26/2022   Zoster, Live 03/29/2008    TDAP status: Up to date  Flu Vaccine status: Due, Education has been provided regarding the importance of this vaccine. Advised may receive this vaccine at local pharmacy or Health Dept. Aware to provide a copy of the vaccination record if obtained from local pharmacy or Health Dept. Verbalized acceptance and understanding.  Pneumococcal vaccine status: Up to date  Covid-19 vaccine status: Completed vaccines  Qualifies for Shingles Vaccine? Yes   Zostavax completed Yes    Shingrix Completed?: Yes  Screening Tests Health Maintenance  Topic Date Due   COVID-19 Vaccine (3 - Moderna risk series) 05/08/2019   MAMMOGRAM  03/10/2022   INFLUENZA VACCINE  08/26/2022   Medicare Annual Wellness (AWV)  12/27/2023   DTaP/Tdap/Td (3 - Tdap) 05/18/2026   Pneumonia Vaccine 32+ Years old  Completed   DEXA SCAN  Completed   Zoster Vaccines- Shingrix  Completed   HPV VACCINES  Aged Out    Health Maintenance  Health Maintenance Due  Topic Date Due   COVID-19 Vaccine (3 - Moderna risk series) 05/08/2019   MAMMOGRAM  03/10/2022   INFLUENZA VACCINE  08/26/2022    Colorectal cancer screening: No longer required.   Mammogram status: No longer required due to age.  Bone Density status: Completed 10/28/2020. Results reflect: Bone density results: OSTEOPOROSIS. Repeat every 3-5 years.  Lung Cancer Screening: (Low Dose CT Chest recommended if Age 31-80 years, 20 pack-year currently smoking OR have quit w/in 15years.) does not qualify.   Lung Cancer Screening Referral: no  Additional Screening:  Hepatitis C Screening: does not qualify; Completed no  Vision Screening: Recommended annual ophthalmology exams for early detection of glaucoma and other disorders of the eye. Is the patient up to date with their annual eye exam?  Yes  Who is the provider or what is the name of the office in which the patient attends annual eye exams? Dr [facility] If pt is not established with a provider, would they like to be referred to a provider to establish care? No .   Dental Screening: Recommended annual dental exams for proper oral hygiene  Diabetic Foot Exam: n/a  Community Resource Referral / Chronic Care Management: CRR required this visit?  No   CCM required this visit?  No    Plan:     I have personally reviewed and noted the following in the patient's chart:   Medical and social history Use of alcohol, tobacco or illicit drugs  Current medications and  supplements including opioid prescriptions. Patient is not currently taking opioid prescriptions. Functional ability and status Nutritional status Physical activity Advanced directives List of other physicians Hospitalizations, surgeries, and ER visits in previous 12 months Vitals Screenings to include cognitive, depression, and falls Referrals and appointments  In addition, I have reviewed and discussed with patient certain preventive protocols, quality metrics, and best practice recommendations. A written personalized care plan for preventive services as well as general preventive health recommendations were provided to patient.    Sue Lush, LPN   16/01/958  After Visit Summary: (MyChart) Due to this being a telephonic visit, the after visit summary with patients personalized plan was offered to patient via MyChart   Nurse Notes: The patient states she is doing alright. She states she is living at [facility] temporarily due to work being done on her house. She says she likes [facility] other than the food they serve sometimes. She has no concerns or questions at this time.

## 2022-12-28 DIAGNOSIS — R2689 Other abnormalities of gait and mobility: Secondary | ICD-10-CM | POA: Diagnosis not present

## 2022-12-28 DIAGNOSIS — R296 Repeated falls: Secondary | ICD-10-CM | POA: Diagnosis not present

## 2022-12-28 DIAGNOSIS — R2681 Unsteadiness on feet: Secondary | ICD-10-CM | POA: Diagnosis not present

## 2022-12-29 ENCOUNTER — Ambulatory Visit: Payer: Medicare PPO

## 2022-12-30 ENCOUNTER — Encounter: Payer: Self-pay | Admitting: Nurse Practitioner

## 2022-12-30 ENCOUNTER — Ambulatory Visit: Payer: Medicare PPO | Admitting: Nurse Practitioner

## 2022-12-30 ENCOUNTER — Telehealth: Payer: Self-pay

## 2022-12-30 VITALS — BP 120/62 | HR 86 | Ht 63.75 in | Wt 119.0 lb

## 2022-12-30 DIAGNOSIS — Z853 Personal history of malignant neoplasm of breast: Secondary | ICD-10-CM | POA: Diagnosis not present

## 2022-12-30 DIAGNOSIS — C50412 Malignant neoplasm of upper-outer quadrant of left female breast: Secondary | ICD-10-CM | POA: Diagnosis not present

## 2022-12-30 DIAGNOSIS — Z780 Asymptomatic menopausal state: Secondary | ICD-10-CM | POA: Diagnosis not present

## 2022-12-30 DIAGNOSIS — Z01419 Encounter for gynecological examination (general) (routine) without abnormal findings: Secondary | ICD-10-CM

## 2022-12-30 DIAGNOSIS — Z9189 Other specified personal risk factors, not elsewhere classified: Secondary | ICD-10-CM

## 2022-12-30 DIAGNOSIS — N6314 Unspecified lump in the right breast, lower inner quadrant: Secondary | ICD-10-CM

## 2022-12-30 DIAGNOSIS — M810 Age-related osteoporosis without current pathological fracture: Secondary | ICD-10-CM

## 2022-12-30 NOTE — Telephone Encounter (Signed)
-----   Message from Olivia Mackie sent at 12/30/2022  4:03 PM EST ----- Regarding: Breast imaging Please send referral for diagnostic imaging of right breast for mass at 5 o'clock. H/O left breast cancer 01/2021.

## 2022-12-30 NOTE — Progress Notes (Signed)
   Sierra Bentley 1933/04/01 [id number]   History:  89 y.o. G0 presents for breast and pelvic exam. Postmenopausal - no HRT, no bleeding. Normal pap history. 02/2021 left breast cancer (IDC) managed with lumpectomy. Osteoporosis, declines medication management.   Gynecologic History No LMP recorded. Patient is postmenopausal.   Contraception: post menopausal status Sexually active: No  Health Maintenance Last Pap: 2016. Results were: Normal Last mammogram: 04/28/2022. Results were: Normal, surgical changes seen Last colonoscopy: 10/12/2011 Last Dexa: 10/28/2020. Results were: T-score -2.5  Past medical history, past surgical history, family history and social history were all reviewed and documented in the EPIC chart. Widowed. Husband passed January 2023.   ROS:  A ROS was performed and pertinent positives and negatives are included.  Exam:  Vitals:   12/30/22 1529  BP: 120/62  Pulse: 86  SpO2: 96%  Weight: 119 lb (54 kg)  Height: 5' 3.75" (1.619 m)   Body mass index is 20.59 kg/m. Physical Exam Constitutional:      Appearance: Normal appearance.  Chest:  Breasts:    Right: Mass and tenderness present. No swelling, bleeding, inverted nipple, nipple discharge or skin change.     Left: Normal.    Genitourinary:    General: Normal vulva.     Vagina: Normal.     Cervix: Normal.     Uterus: Normal.      Adnexa: Right adnexa normal and left adnexa normal.  Lymphadenopathy:     Upper Body:     Right upper body: No supraclavicular or axillary adenopathy.     Patient informed chaperone available to be present for breast and pelvic exam. Patient has requested no chaperone to be present. Patient has been advised what will be completed during breast and pelvic exam.   Assessment/Plan:  89 y.o. G0 for breast and pelvic exam.   Encounter for breast and pelvic examination - Education provided on SBEs, importance of preventative screenings, current guidelines, high calcium diet,  regular exercise, and multivitamin daily. Labs with PCP.   Postmenopausal - no HRT, no bleeding  History of left breast cancer - 02/2021 left breast cancer (IDC) managed with lumpectomy. Normal mammogram 04/2022.   Age-related osteoporosis without current pathological fracture - Plan: DG Bone Density. T-score -2.5 in 2021. Declined treatment at that time. Will schedule DXA at [facility].   Breast lump on right side at 5 o'clock position - ~6 mm firm, non mobile mass 1 cm from nipple. Will send for diagnostic imaging.   Screening for cervical cancer - Normal Pap history.  No longer screening per guidelines.   Screening for colon cancer - 2013 colonoscopy. No longer screening.   Return in about 1 year (around 12/30/2023) for B&P (high risk).   Olivia Mackie DNP, 4:04 PM 12/30/2022

## 2022-12-30 NOTE — Telephone Encounter (Signed)
Order filled out for [facility] since pt's previous imaging ctr and placed on provider's desk for authorization.

## 2022-12-31 NOTE — Telephone Encounter (Signed)
Order signed by [facility] and faxed successfully.   LDVM on machine per DPR advising the pt that the order was sent and for her to call [facility] at her earliest convenience to schedule. [facility]'s contact number provided to pt via VM.

## 2023-01-04 DIAGNOSIS — R2681 Unsteadiness on feet: Secondary | ICD-10-CM | POA: Diagnosis not present

## 2023-01-04 DIAGNOSIS — R296 Repeated falls: Secondary | ICD-10-CM | POA: Diagnosis not present

## 2023-01-04 DIAGNOSIS — R2689 Other abnormalities of gait and mobility: Secondary | ICD-10-CM | POA: Diagnosis not present

## 2023-01-05 ENCOUNTER — Ambulatory Visit: Payer: Medicare PPO

## 2023-01-07 NOTE — Telephone Encounter (Signed)
Spoke w/ Shante @ [facility] and she reported that the pt is scheduled on 01/11/2023 @ 10am.   Routing to provider for final review and closing encounter.

## 2023-01-10 DIAGNOSIS — H353231 Exudative age-related macular degeneration, bilateral, with active choroidal neovascularization: Secondary | ICD-10-CM | POA: Diagnosis not present

## 2023-01-11 ENCOUNTER — Encounter: Payer: Self-pay | Admitting: Nurse Practitioner

## 2023-01-11 DIAGNOSIS — N6001 Solitary cyst of right breast: Secondary | ICD-10-CM | POA: Diagnosis not present

## 2023-01-11 DIAGNOSIS — N6315 Unspecified lump in the right breast, overlapping quadrants: Secondary | ICD-10-CM | POA: Diagnosis not present

## 2023-01-11 DIAGNOSIS — Z853 Personal history of malignant neoplasm of breast: Secondary | ICD-10-CM | POA: Diagnosis not present

## 2023-01-12 ENCOUNTER — Ambulatory Visit: Payer: Medicare PPO

## 2023-01-13 DIAGNOSIS — R2689 Other abnormalities of gait and mobility: Secondary | ICD-10-CM | POA: Diagnosis not present

## 2023-01-13 DIAGNOSIS — R2681 Unsteadiness on feet: Secondary | ICD-10-CM | POA: Diagnosis not present

## 2023-01-13 DIAGNOSIS — R296 Repeated falls: Secondary | ICD-10-CM | POA: Diagnosis not present

## 2023-01-17 DIAGNOSIS — R2689 Other abnormalities of gait and mobility: Secondary | ICD-10-CM | POA: Diagnosis not present

## 2023-01-17 DIAGNOSIS — R2681 Unsteadiness on feet: Secondary | ICD-10-CM | POA: Diagnosis not present

## 2023-01-17 DIAGNOSIS — R296 Repeated falls: Secondary | ICD-10-CM | POA: Diagnosis not present

## 2023-01-20 ENCOUNTER — Ambulatory Visit: Payer: Medicare PPO

## 2023-01-21 DIAGNOSIS — R296 Repeated falls: Secondary | ICD-10-CM | POA: Diagnosis not present

## 2023-01-21 DIAGNOSIS — R2689 Other abnormalities of gait and mobility: Secondary | ICD-10-CM | POA: Diagnosis not present

## 2023-01-21 DIAGNOSIS — R2681 Unsteadiness on feet: Secondary | ICD-10-CM | POA: Diagnosis not present

## 2023-01-24 DIAGNOSIS — R296 Repeated falls: Secondary | ICD-10-CM | POA: Diagnosis not present

## 2023-01-24 DIAGNOSIS — R2681 Unsteadiness on feet: Secondary | ICD-10-CM | POA: Diagnosis not present

## 2023-01-24 DIAGNOSIS — R2689 Other abnormalities of gait and mobility: Secondary | ICD-10-CM | POA: Diagnosis not present

## 2023-01-27 ENCOUNTER — Ambulatory Visit: Payer: Medicare PPO

## 2023-01-27 DIAGNOSIS — R296 Repeated falls: Secondary | ICD-10-CM | POA: Diagnosis not present

## 2023-01-27 DIAGNOSIS — R2681 Unsteadiness on feet: Secondary | ICD-10-CM | POA: Diagnosis not present

## 2023-01-27 DIAGNOSIS — R2689 Other abnormalities of gait and mobility: Secondary | ICD-10-CM | POA: Diagnosis not present

## 2023-01-28 ENCOUNTER — Ambulatory Visit: Payer: Medicare PPO

## 2023-01-28 ENCOUNTER — Telehealth: Payer: Self-pay | Admitting: Pulmonary Disease

## 2023-01-28 NOTE — Telephone Encounter (Signed)
 Patient needs Pft to be rescheduled as well as a follow up with Dr.Assaker once the PFT has been scheduled. [phone]

## 2023-02-01 NOTE — Telephone Encounter (Signed)
 I spoke with Sierra Bentley on 01/28/23 and her PFT has been rescheduled again to 02/14/2023 @ 1:00pm at [facility] Entrance

## 2023-02-02 DIAGNOSIS — R2689 Other abnormalities of gait and mobility: Secondary | ICD-10-CM | POA: Diagnosis not present

## 2023-02-02 DIAGNOSIS — R296 Repeated falls: Secondary | ICD-10-CM | POA: Diagnosis not present

## 2023-02-02 DIAGNOSIS — R2681 Unsteadiness on feet: Secondary | ICD-10-CM | POA: Diagnosis not present

## 2023-02-03 ENCOUNTER — Ambulatory Visit: Payer: Medicare PPO | Admitting: Pulmonary Disease

## 2023-02-03 DIAGNOSIS — Z853 Personal history of malignant neoplasm of breast: Secondary | ICD-10-CM | POA: Diagnosis not present

## 2023-02-03 DIAGNOSIS — M8588 Other specified disorders of bone density and structure, other site: Secondary | ICD-10-CM | POA: Diagnosis not present

## 2023-02-03 DIAGNOSIS — Z8262 Family history of osteoporosis: Secondary | ICD-10-CM | POA: Diagnosis not present

## 2023-02-03 LAB — HM DEXA SCAN

## 2023-02-04 ENCOUNTER — Encounter: Payer: Self-pay | Admitting: Family Medicine

## 2023-02-04 DIAGNOSIS — R296 Repeated falls: Secondary | ICD-10-CM | POA: Diagnosis not present

## 2023-02-04 DIAGNOSIS — R2681 Unsteadiness on feet: Secondary | ICD-10-CM | POA: Diagnosis not present

## 2023-02-04 DIAGNOSIS — R2689 Other abnormalities of gait and mobility: Secondary | ICD-10-CM | POA: Diagnosis not present

## 2023-02-09 DIAGNOSIS — R296 Repeated falls: Secondary | ICD-10-CM | POA: Diagnosis not present

## 2023-02-09 DIAGNOSIS — R2681 Unsteadiness on feet: Secondary | ICD-10-CM | POA: Diagnosis not present

## 2023-02-09 DIAGNOSIS — R2689 Other abnormalities of gait and mobility: Secondary | ICD-10-CM | POA: Diagnosis not present

## 2023-02-10 DIAGNOSIS — R296 Repeated falls: Secondary | ICD-10-CM | POA: Diagnosis not present

## 2023-02-10 DIAGNOSIS — R2689 Other abnormalities of gait and mobility: Secondary | ICD-10-CM | POA: Diagnosis not present

## 2023-02-10 DIAGNOSIS — R2681 Unsteadiness on feet: Secondary | ICD-10-CM | POA: Diagnosis not present

## 2023-02-14 ENCOUNTER — Ambulatory Visit: Payer: Medicare PPO | Attending: Pulmonary Disease

## 2023-02-17 ENCOUNTER — Inpatient Hospital Stay: Payer: Medicare PPO | Attending: Hematology and Oncology | Admitting: Hematology and Oncology

## 2023-02-17 VITALS — BP 136/58 | HR 80 | Temp 97.6°F | Resp 18 | Ht 63.75 in | Wt 123.7 lb

## 2023-02-17 DIAGNOSIS — Z1732 Human epidermal growth factor receptor 2 negative status: Secondary | ICD-10-CM | POA: Insufficient documentation

## 2023-02-17 DIAGNOSIS — Z1722 Progesterone receptor negative status: Secondary | ICD-10-CM | POA: Insufficient documentation

## 2023-02-17 DIAGNOSIS — Z79899 Other long term (current) drug therapy: Secondary | ICD-10-CM | POA: Diagnosis not present

## 2023-02-17 DIAGNOSIS — Z171 Estrogen receptor negative status [ER-]: Secondary | ICD-10-CM | POA: Diagnosis not present

## 2023-02-17 DIAGNOSIS — Z923 Personal history of irradiation: Secondary | ICD-10-CM | POA: Insufficient documentation

## 2023-02-17 DIAGNOSIS — C50412 Malignant neoplasm of upper-outer quadrant of left female breast: Secondary | ICD-10-CM | POA: Diagnosis not present

## 2023-02-17 NOTE — Assessment & Plan Note (Signed)
04/13/2021:Left lumpectomy: Grade 2 invasive carcinoma with apocrine features, DCIS grade 2 with necrosis and microcalcifications, margins negative, ER 0%, PR 0%, HER2 negative, Ki-67 5% (lymph nodes not sampled)    Treatment plan: Adjuvant radiation followed by surveillance   Breast cancer surveillance: Breast exam 02/17/2023: Benign Mammogram 01/11/2023 at [facility]: Benign   Return to clinic in 1 year for follow-up

## 2023-02-17 NOTE — Progress Notes (Signed)
Patient Care Team: Tower, Audrie Gallus, MD as PCP - General Linus Salmons, MD as Referring Physician (Otolaryngology) Lilyan Punt, MD as Referring Physician (Ophthalmology) Emelia Loron, MD as Consulting Physician (General Surgery) Serena Croissant, MD as Consulting Physician (Hematology and Oncology) Antony Blackbird, MD as Consulting Physician (Radiation Oncology) Center, [facility], [facility], [facility]  DIAGNOSIS:  Encounter Diagnosis  Name Primary?   Malignant neoplasm of upper-outer quadrant of left breast in female, estrogen receptor negative (HCC) Yes    SUMMARY OF ONCOLOGIC HISTORY: Oncology History  Malignant neoplasm of upper-outer quadrant of left breast in female, estrogen receptor negative (HCC)  03/10/2021 Initial Diagnosis   Screening mammogram detected left breast mass at 12 o'clock position: 1.6 cm, by ultrasound retroareolar mass 3.5 x 1.4 x 2.2 cm, additional mass 0.8 cm: Benign on biopsy, axilla negative   03/18/2021 Cancer Staging   Staging form: Breast, AJCC 8th Edition - Clinical stage from 03/18/2021: Stage IIB (cT2, cN0, cM0, G2, ER-, PR-, HER2-) - Signed by Serena Croissant, MD on 03/18/2021 Stage prefix: Initial diagnosis Histologic grading system: 3 grade system   04/13/2021 Surgery   Left lumpectomy: Grade 2 invasive carcinoma with apocrine features, DCIS grade 2 with necrosis and microcalcifications, margins negative, ER 0%, PR 0%, HER2 negative, Ki-67 5% (lymph nodes not sampled)   05/20/2021 - 06/10/2021 Radiation Therapy   Site Technique Total Dose (Gy) Dose per Fx (Gy) Completed Fx Beam Energies  Breast, Left: Breast_L 3D 42.72/42.72 2.67 16/16 6XFFF       CHIEF COMPLIANT:   HISTORY OF PRESENT ILLNESS: Discussed the use of AI scribe software for clinical note transcription with the patient, who gave verbal consent to proceed.  History of Present Illness   Sierra Bentley, a breast cancer survivor, presents two years post-diagnosis. She reports no  significant health issues related to her cancer and her most recent mammogram in December was normal. She is currently on multiple medications for her eyes, including Miro 128 5% ointment and liquid, Systane eye drops, and Xalatan eye drops for glaucoma. She also takes a probiotic, Librarian, academic. She has been experiencing some lung issues, with some scarring noted on both lungs. She also mentions a "distressed esophagus" diagnosed by a previous doctor, but she is able to swallow okay and only occasionally experiences a sore throat. She has been feeling very tired recently, but her EKG and blood tests were normal. She has a sweet tooth and is trying to balance her diet with healthier options.         ALLERGIES:  is allergic to ace inhibitors, boniva [ibandronic acid], chocolate, fosamax [alendronate], and oxycodone.  MEDICATIONS:  Current Outpatient Medications  Medication Sig Dispense Refill   latanoprost (XALATAN) 0.005 % ophthalmic solution SMARTSIG:In Eye(s) (Patient not taking: Reported on 12/30/2022)     losartan-hydrochlorothiazide (HYZAAR) 50-12.5 MG tablet Take 0.5 tablets by mouth every evening. 45 tablet 3   Multiple Vitamin (MULTIVITAMIN) tablet Take 1 tablet by mouth daily. Per pt has Postmenopausal multivitamin packet with 5 pills     pantoprazole (PROTONIX) 20 MG tablet Take 1 tablet (20 mg total) by mouth daily. 90 tablet 3   Polyethyl Glycol-Propyl Glycol (SYSTANE ULTRA OP) Place 1 drop into both eyes 4 (four) times daily.     Probiotic Product (ALIGN EXTRA STRENGTH PO) Take by mouth.     sodium chloride (MURO 128) 5 % ophthalmic ointment Place 1 application  into both eyes at bedtime.  Uses Muro eye drops during the day as well  TURMERIC PO Take 1,300 mg by mouth in the morning and at bedtime.     No current facility-administered medications for this visit.    PHYSICAL EXAMINATION: ECOG PERFORMANCE STATUS: 1 - Symptomatic but completely ambulatory  Vitals:   02/17/23 1431  BP:  (!) 136/58  Pulse: 80  Resp: 18  Temp: 97.6 F (36.4 C)  SpO2: 98%   Filed Weights   02/17/23 1431  Weight: 123 lb 11.2 oz (56.1 kg)    Physical Exam   BREAST: Lymph nodes palpated, normal. Breasts examined, normal with slight soreness.      (exam performed in the presence of a chaperone)  LABORATORY DATA:  I have reviewed the data as listed    Latest Ref Rng & Units 09/15/2022    4:10 PM 04/13/2022    8:44 AM 04/03/2021    9:57 AM  CMP  Glucose 70 - 99 mg/dL 528  88  86   BUN 6 - 23 mg/dL 15  14  19    Creatinine 0.40 - 1.20 mg/dL 4.13  2.44  0.10   Sodium 135 - 145 mEq/L 140  140  139   Potassium 3.5 - 5.1 mEq/L 4.1  4.1  4.3   Chloride 96 - 112 mEq/L 100  100  103   CO2 19 - 32 mEq/L 32  31  28   Calcium 8.4 - 10.5 mg/dL 9.5  9.9  9.4   Total Protein 6.0 - 8.3 g/dL 6.5  6.8    Total Bilirubin 0.2 - 1.2 mg/dL 0.3  0.5    Alkaline Phos 39 - 117 U/L 43  56    AST 0 - 37 U/L 16  20    ALT 0 - 35 U/L 15  15      Lab Results  Component Value Date   WBC 5.5 09/15/2022   HGB 12.7 09/15/2022   HCT 39.6 09/15/2022   MCV 86.7 09/15/2022   PLT 245.0 09/15/2022   NEUTROABS 3.8 09/15/2022    ASSESSMENT & PLAN:  Malignant neoplasm of upper-outer quadrant of left breast in female, estrogen receptor negative (HCC) 04/13/2021:Left lumpectomy: Grade 2 invasive carcinoma with apocrine features, DCIS grade 2 with necrosis and microcalcifications, margins negative, ER 0%, PR 0%, HER2 negative, Ki-67 5% (lymph nodes not sampled)    Treatment plan: Adjuvant radiation completed 06/10/2021   Breast cancer surveillance: Breast exam 02/17/2023: Benign Mammogram 01/11/2023 at [facility]: Benign   Return to clinic in 1 year for follow-up ------------------------------------- Assessment and Plan    Breast Cancer Two years post-diagnosis, recent mammogram in December was normal. No palpable lymph nodes or lumps on physical examination. -Continue annual mammograms and follow-up in one  year.  Eye Conditions Multiple eye medications including Miro 128 5% ointment, Miro 128 5% liquid, Systane eye drops, and Xalatan eye drops for glaucoma. -Continue current eye medications as prescribed.  Lung Scarring Patient reports fatigue and recent lung exam showed scarring, initially thought to be emphysema. Patient has a scheduled breathing test. -Complete scheduled breathing test. -Discuss results with pulmonologist.  Esophageal Discomfort Patient reports occasional sore throat and a previous comment from a surgeon about a "distressed esophagus." -Consider consultation with a gastroenterologist for further evaluation.  Dietary Guidance Patient enjoys sweets, discussed balanced diet including vegetables, carbohydrates, and lean proteins. -Continue balanced diet, limit refined sugars.  General Health Maintenance -Continue Align (probiotic) as currently taken.          No orders of the defined types were placed in  this encounter.  The patient has a good understanding of the overall plan. she agrees with it. she will call with any problems that may develop before the next visit here. Total time spent: 30 mins including face to face time and time spent for planning, charting and co-ordination of care   Tamsen Meek, MD 02/17/23

## 2023-02-18 DIAGNOSIS — R2689 Other abnormalities of gait and mobility: Secondary | ICD-10-CM | POA: Diagnosis not present

## 2023-02-18 DIAGNOSIS — R2681 Unsteadiness on feet: Secondary | ICD-10-CM | POA: Diagnosis not present

## 2023-02-18 DIAGNOSIS — R296 Repeated falls: Secondary | ICD-10-CM | POA: Diagnosis not present

## 2023-02-22 ENCOUNTER — Ambulatory Visit: Payer: Medicare PPO | Admitting: Pulmonary Disease

## 2023-02-22 DIAGNOSIS — R2689 Other abnormalities of gait and mobility: Secondary | ICD-10-CM | POA: Diagnosis not present

## 2023-02-22 DIAGNOSIS — R296 Repeated falls: Secondary | ICD-10-CM | POA: Diagnosis not present

## 2023-02-22 DIAGNOSIS — R2681 Unsteadiness on feet: Secondary | ICD-10-CM | POA: Diagnosis not present

## 2023-03-01 DIAGNOSIS — R2681 Unsteadiness on feet: Secondary | ICD-10-CM | POA: Diagnosis not present

## 2023-03-01 DIAGNOSIS — R296 Repeated falls: Secondary | ICD-10-CM | POA: Diagnosis not present

## 2023-03-01 DIAGNOSIS — R2689 Other abnormalities of gait and mobility: Secondary | ICD-10-CM | POA: Diagnosis not present

## 2023-03-03 DIAGNOSIS — R2681 Unsteadiness on feet: Secondary | ICD-10-CM | POA: Diagnosis not present

## 2023-03-03 DIAGNOSIS — R2689 Other abnormalities of gait and mobility: Secondary | ICD-10-CM | POA: Diagnosis not present

## 2023-03-03 DIAGNOSIS — R296 Repeated falls: Secondary | ICD-10-CM | POA: Diagnosis not present

## 2023-03-07 DIAGNOSIS — H353231 Exudative age-related macular degeneration, bilateral, with active choroidal neovascularization: Secondary | ICD-10-CM | POA: Diagnosis not present

## 2023-03-08 DIAGNOSIS — R2681 Unsteadiness on feet: Secondary | ICD-10-CM | POA: Diagnosis not present

## 2023-03-08 DIAGNOSIS — R2689 Other abnormalities of gait and mobility: Secondary | ICD-10-CM | POA: Diagnosis not present

## 2023-03-08 DIAGNOSIS — R296 Repeated falls: Secondary | ICD-10-CM | POA: Diagnosis not present

## 2023-03-09 DIAGNOSIS — B351 Tinea unguium: Secondary | ICD-10-CM | POA: Diagnosis not present

## 2023-03-09 DIAGNOSIS — M79675 Pain in left toe(s): Secondary | ICD-10-CM | POA: Diagnosis not present

## 2023-03-09 DIAGNOSIS — M79674 Pain in right toe(s): Secondary | ICD-10-CM | POA: Diagnosis not present

## 2023-03-10 DIAGNOSIS — R296 Repeated falls: Secondary | ICD-10-CM | POA: Diagnosis not present

## 2023-03-10 DIAGNOSIS — R2681 Unsteadiness on feet: Secondary | ICD-10-CM | POA: Diagnosis not present

## 2023-03-10 DIAGNOSIS — R2689 Other abnormalities of gait and mobility: Secondary | ICD-10-CM | POA: Diagnosis not present

## 2023-03-15 DIAGNOSIS — R296 Repeated falls: Secondary | ICD-10-CM | POA: Diagnosis not present

## 2023-03-15 DIAGNOSIS — R2689 Other abnormalities of gait and mobility: Secondary | ICD-10-CM | POA: Diagnosis not present

## 2023-03-15 DIAGNOSIS — R2681 Unsteadiness on feet: Secondary | ICD-10-CM | POA: Diagnosis not present

## 2023-03-17 ENCOUNTER — Ambulatory Visit: Payer: Medicare PPO | Admitting: Pulmonary Disease

## 2023-03-17 ENCOUNTER — Ambulatory Visit: Payer: Medicare PPO

## 2023-03-18 ENCOUNTER — Ambulatory Visit: Payer: Self-pay | Admitting: Family Medicine

## 2023-03-18 DIAGNOSIS — R2681 Unsteadiness on feet: Secondary | ICD-10-CM | POA: Diagnosis not present

## 2023-03-18 DIAGNOSIS — R296 Repeated falls: Secondary | ICD-10-CM | POA: Diagnosis not present

## 2023-03-18 DIAGNOSIS — R2689 Other abnormalities of gait and mobility: Secondary | ICD-10-CM | POA: Diagnosis not present

## 2023-03-18 NOTE — Telephone Encounter (Signed)
Copied from CRM [id number]. Topic: Clinical - Red Word Triage >> Mar 18, 2023 12:24 PM Almira Coaster wrote: Red Word that prompted transfer to Nurse Triage: Severe dizziness, cough, congestion and headaches for the last couple of days.  Chief Complaint: cough , episodes of dizziness Symptoms: runny nose, dry cough Frequency: 2 days Pertinent Negatives: Patient denies fever, SOB Disposition: [] ED /[] Urgent Care (no appt availability in office) / [] Appointment(In office/virtual)/ []  [facility]/ [x] Home Care/ [] Refused Recommended Disposition /[] [facility]/ []  Follow-up with PCP Additional Notes:   Tested negative for Covid. Reason for Disposition  Cough with cold symptoms (e.g., runny nose, postnasal drip, throat clearing)  Answer Assessment - Initial Assessment Questions 1. ONSET: "When did the cough begin?"      2 days  2. SEVERITY: "How bad is the cough today?"      Dry  cough  3. SPUTUM: "Describe the color of your sputum" (none, dry cough; clear, white, yellow, green)     no 4. HEMOPTYSIS: "Are you coughing up any blood?" If so ask: "How much?" (flecks, streaks, tablespoons, etc.)     N/a 5. DIFFICULTY BREATHING: "Are you having difficulty breathing?" If Yes, ask: "How bad is it?" (e.g., mild, moderate, severe)    - MILD: No SOB at rest, mild SOB with walking, speaks normally in sentences, can lie down, no retractions, pulse < 100.    - MODERATE: SOB at rest, SOB with minimal exertion and prefers to sit, cannot lie down flat, speaks in phrases, mild retractions, audible wheezing, pulse 100-120.    - SEVERE: Very SOB at rest, speaks in single words, struggling to breathe, sitting hunched forward, retractions, pulse > 120      no 6. FEVER: "Do you have a fever?" If Yes, ask: "What is your temperature, how was it measured, and when did it start?"     no  8. LUNG HISTORY: "Do you have any history of lung disease?"  (e.g., pulmonary embolus, asthma, emphysema)     Is  being worked up  scarring on lungs   10. OTHER SYMPTOMS: "Do you have any other symptoms?" (e.g., runny nose, wheezing, chest pain)       Nasal congestion  Answer Assessment - Initial Assessment Questions 1. DESCRIPTION: "Describe your dizziness."     no 2. LIGHTHEADED: "Do you feel lightheaded?" (e.g., somewhat faint, woozy, weak upon standing)    lightheadednes 3. VERTIGO: "Do you feel like either you or the room is spinning or tilting?" (i.e. vertigo)     *No Answer* 4. SEVERITY: "How bad is it?"  "Do you feel like you are going to faint?" "Can you stand and walk?"   - MILD: Feels slightly dizzy, but walking normally.   - MODERATE: Feels unsteady when walking, but not falling; interferes with normal activities (e.g., school, work).   - SEVERE: Unable to walk without falling, or requires assistance to walk without falling; feels like passing out now.      *No Answer* 5. ONSET:  "When did the dizziness begin?"     *No Answer* 6. AGGRAVATING FACTORS: "Does anything make it worse?" (e.g., standing, change in head position)     standing 7. HEART RATE: "Can you tell me your heart rate?" "How many beats in 15 seconds?"  (Note: not all patients can do this)       *No Answer* 8. CAUSE: "What do you think is causing the dizziness?"     *No Answer* 9. RECURRENT SYMPTOM: "Have  you had dizziness before?" If Yes, ask: "When was the last time?" "What happened that time?"     *No Answer* 10. OTHER SYMPTOMS: "Do you have any other symptoms?" (e.g., fever, chest pain, vomiting, diarrhea, bleeding)        Dry Cough,congestion 11. PREGNANCY: "Is there any chance you are pregnant?" "When was your last menstrual period?"       *No Answer*  Protocols used: Dizziness - Lightheadedness-A-AH, Cough - Acute Non-Productive-A-AH

## 2023-03-18 NOTE — Telephone Encounter (Signed)
Pending triage I am out of office/town

## 2023-03-22 DIAGNOSIS — R2689 Other abnormalities of gait and mobility: Secondary | ICD-10-CM | POA: Diagnosis not present

## 2023-03-22 DIAGNOSIS — R2681 Unsteadiness on feet: Secondary | ICD-10-CM | POA: Diagnosis not present

## 2023-03-22 DIAGNOSIS — R296 Repeated falls: Secondary | ICD-10-CM | POA: Diagnosis not present

## 2023-03-29 DIAGNOSIS — R2681 Unsteadiness on feet: Secondary | ICD-10-CM | POA: Diagnosis not present

## 2023-03-29 DIAGNOSIS — R2689 Other abnormalities of gait and mobility: Secondary | ICD-10-CM | POA: Diagnosis not present

## 2023-03-29 DIAGNOSIS — R296 Repeated falls: Secondary | ICD-10-CM | POA: Diagnosis not present

## 2023-03-30 DIAGNOSIS — R2681 Unsteadiness on feet: Secondary | ICD-10-CM | POA: Diagnosis not present

## 2023-03-30 DIAGNOSIS — R2689 Other abnormalities of gait and mobility: Secondary | ICD-10-CM | POA: Diagnosis not present

## 2023-03-30 DIAGNOSIS — R296 Repeated falls: Secondary | ICD-10-CM | POA: Diagnosis not present

## 2023-03-31 DIAGNOSIS — R296 Repeated falls: Secondary | ICD-10-CM | POA: Diagnosis not present

## 2023-03-31 DIAGNOSIS — R2681 Unsteadiness on feet: Secondary | ICD-10-CM | POA: Diagnosis not present

## 2023-03-31 DIAGNOSIS — R2689 Other abnormalities of gait and mobility: Secondary | ICD-10-CM | POA: Diagnosis not present

## 2023-04-05 DIAGNOSIS — R2681 Unsteadiness on feet: Secondary | ICD-10-CM | POA: Diagnosis not present

## 2023-04-05 DIAGNOSIS — R2689 Other abnormalities of gait and mobility: Secondary | ICD-10-CM | POA: Diagnosis not present

## 2023-04-05 DIAGNOSIS — R296 Repeated falls: Secondary | ICD-10-CM | POA: Diagnosis not present

## 2023-04-07 DIAGNOSIS — R2689 Other abnormalities of gait and mobility: Secondary | ICD-10-CM | POA: Diagnosis not present

## 2023-04-07 DIAGNOSIS — R2681 Unsteadiness on feet: Secondary | ICD-10-CM | POA: Diagnosis not present

## 2023-04-07 DIAGNOSIS — R296 Repeated falls: Secondary | ICD-10-CM | POA: Diagnosis not present

## 2023-04-12 DIAGNOSIS — R2681 Unsteadiness on feet: Secondary | ICD-10-CM | POA: Diagnosis not present

## 2023-04-12 DIAGNOSIS — R296 Repeated falls: Secondary | ICD-10-CM | POA: Diagnosis not present

## 2023-04-12 DIAGNOSIS — R2689 Other abnormalities of gait and mobility: Secondary | ICD-10-CM | POA: Diagnosis not present

## 2023-04-14 DIAGNOSIS — R2681 Unsteadiness on feet: Secondary | ICD-10-CM | POA: Diagnosis not present

## 2023-04-14 DIAGNOSIS — R296 Repeated falls: Secondary | ICD-10-CM | POA: Diagnosis not present

## 2023-04-14 DIAGNOSIS — R2689 Other abnormalities of gait and mobility: Secondary | ICD-10-CM | POA: Diagnosis not present

## 2023-04-17 ENCOUNTER — Telehealth: Payer: Self-pay | Admitting: Family Medicine

## 2023-04-17 DIAGNOSIS — E7800 Pure hypercholesterolemia, unspecified: Secondary | ICD-10-CM

## 2023-04-17 DIAGNOSIS — Z79899 Other long term (current) drug therapy: Secondary | ICD-10-CM

## 2023-04-17 DIAGNOSIS — M810 Age-related osteoporosis without current pathological fracture: Secondary | ICD-10-CM

## 2023-04-17 DIAGNOSIS — I10 Essential (primary) hypertension: Secondary | ICD-10-CM

## 2023-04-17 DIAGNOSIS — R7303 Prediabetes: Secondary | ICD-10-CM

## 2023-04-17 NOTE — Telephone Encounter (Signed)
-----   Message from Alvina Chou sent at 04/04/2023  9:58 AM EDT ----- Regarding: Lab orders for Tue, 3.25.25 Patient is scheduled for CPX labs, please order future labs, Thanks , Camelia Eng

## 2023-04-19 ENCOUNTER — Other Ambulatory Visit (INDEPENDENT_AMBULATORY_CARE_PROVIDER_SITE_OTHER): Payer: Medicare PPO

## 2023-04-19 DIAGNOSIS — R2689 Other abnormalities of gait and mobility: Secondary | ICD-10-CM | POA: Diagnosis not present

## 2023-04-19 DIAGNOSIS — R296 Repeated falls: Secondary | ICD-10-CM | POA: Diagnosis not present

## 2023-04-19 DIAGNOSIS — E7800 Pure hypercholesterolemia, unspecified: Secondary | ICD-10-CM | POA: Diagnosis not present

## 2023-04-19 DIAGNOSIS — I10 Essential (primary) hypertension: Secondary | ICD-10-CM

## 2023-04-19 DIAGNOSIS — R7303 Prediabetes: Secondary | ICD-10-CM

## 2023-04-19 DIAGNOSIS — R2681 Unsteadiness on feet: Secondary | ICD-10-CM | POA: Diagnosis not present

## 2023-04-19 DIAGNOSIS — M810 Age-related osteoporosis without current pathological fracture: Secondary | ICD-10-CM | POA: Diagnosis not present

## 2023-04-19 DIAGNOSIS — Z79899 Other long term (current) drug therapy: Secondary | ICD-10-CM

## 2023-04-19 LAB — CBC WITH DIFFERENTIAL/PLATELET
Basophils Absolute: 0.1 10*3/uL (ref 0.0–0.1)
Basophils Relative: 1.2 % (ref 0.0–3.0)
Eosinophils Absolute: 0.1 10*3/uL (ref 0.0–0.7)
Eosinophils Relative: 1.9 % (ref 0.0–5.0)
HCT: 39.2 % (ref 36.0–46.0)
Hemoglobin: 12.9 g/dL (ref 12.0–15.0)
Lymphocytes Relative: 27.9 % (ref 12.0–46.0)
Lymphs Abs: 1.3 10*3/uL (ref 0.7–4.0)
MCHC: 32.9 g/dL (ref 30.0–36.0)
MCV: 85.4 fl (ref 78.0–100.0)
Monocytes Absolute: 0.4 10*3/uL (ref 0.1–1.0)
Monocytes Relative: 8.3 % (ref 3.0–12.0)
Neutro Abs: 2.9 10*3/uL (ref 1.4–7.7)
Neutrophils Relative %: 60.7 % (ref 43.0–77.0)
Platelets: 210 10*3/uL (ref 150.0–400.0)
RBC: 4.59 Mil/uL (ref 3.87–5.11)
RDW: 14.1 % (ref 11.5–15.5)
WBC: 4.7 10*3/uL (ref 4.0–10.5)

## 2023-04-19 LAB — TSH: TSH: 1.76 u[IU]/mL (ref 0.35–5.50)

## 2023-04-19 LAB — VITAMIN B12: Vitamin B-12: 994 pg/mL — ABNORMAL HIGH (ref 211–911)

## 2023-04-19 LAB — COMPREHENSIVE METABOLIC PANEL
ALT: 13 U/L (ref 0–35)
AST: 16 U/L (ref 0–37)
Albumin: 4.3 g/dL (ref 3.5–5.2)
Alkaline Phosphatase: 48 U/L (ref 39–117)
BUN: 18 mg/dL (ref 6–23)
CO2: 33 meq/L — ABNORMAL HIGH (ref 19–32)
Calcium: 9.8 mg/dL (ref 8.4–10.5)
Chloride: 100 meq/L (ref 96–112)
Creatinine, Ser: 0.74 mg/dL (ref 0.40–1.20)
GFR: 72.79 mL/min (ref >60.00)
Glucose, Bld: 98 mg/dL (ref 70–99)
Potassium: 4.3 meq/L (ref 3.5–5.1)
Sodium: 139 meq/L (ref 135–145)
Total Bilirubin: 0.6 mg/dL (ref 0.2–1.2)
Total Protein: 6.4 g/dL (ref 6.0–8.3)

## 2023-04-19 LAB — LIPID PANEL
Cholesterol: 184 mg/dL (ref 0–200)
HDL: 61.9 mg/dL (ref >39.00)
LDL Cholesterol: 112 mg/dL — ABNORMAL HIGH (ref 0–99)
NonHDL: 122.32
Total CHOL/HDL Ratio: 3
Triglycerides: 53 mg/dL (ref 0.0–149.0)
VLDL: 10.6 mg/dL (ref 0.0–40.0)

## 2023-04-19 LAB — HEMOGLOBIN A1C: Hgb A1c MFr Bld: 5.6 % (ref 4.6–6.5)

## 2023-04-19 LAB — VITAMIN D 25 HYDROXY (VIT D DEFICIENCY, FRACTURES): VITD: 26.04 ng/mL — ABNORMAL LOW (ref 30.00–100.00)

## 2023-04-21 ENCOUNTER — Other Ambulatory Visit: Payer: Self-pay

## 2023-04-21 DIAGNOSIS — R0602 Shortness of breath: Secondary | ICD-10-CM

## 2023-04-21 DIAGNOSIS — R2681 Unsteadiness on feet: Secondary | ICD-10-CM | POA: Diagnosis not present

## 2023-04-21 DIAGNOSIS — R296 Repeated falls: Secondary | ICD-10-CM | POA: Diagnosis not present

## 2023-04-21 DIAGNOSIS — R2689 Other abnormalities of gait and mobility: Secondary | ICD-10-CM | POA: Diagnosis not present

## 2023-04-25 ENCOUNTER — Other Ambulatory Visit: Payer: Self-pay | Admitting: Family Medicine

## 2023-04-26 ENCOUNTER — Encounter: Payer: Medicare PPO | Admitting: Family Medicine

## 2023-04-26 DIAGNOSIS — R296 Repeated falls: Secondary | ICD-10-CM | POA: Diagnosis not present

## 2023-04-26 DIAGNOSIS — R2681 Unsteadiness on feet: Secondary | ICD-10-CM | POA: Diagnosis not present

## 2023-04-26 DIAGNOSIS — R2689 Other abnormalities of gait and mobility: Secondary | ICD-10-CM | POA: Diagnosis not present

## 2023-04-28 ENCOUNTER — Ambulatory Visit: Attending: Pulmonary Disease

## 2023-04-28 DIAGNOSIS — R2681 Unsteadiness on feet: Secondary | ICD-10-CM | POA: Diagnosis not present

## 2023-04-28 DIAGNOSIS — R0609 Other forms of dyspnea: Secondary | ICD-10-CM | POA: Insufficient documentation

## 2023-04-28 DIAGNOSIS — R2689 Other abnormalities of gait and mobility: Secondary | ICD-10-CM | POA: Diagnosis not present

## 2023-04-28 DIAGNOSIS — R296 Repeated falls: Secondary | ICD-10-CM | POA: Diagnosis not present

## 2023-04-28 DIAGNOSIS — R0602 Shortness of breath: Secondary | ICD-10-CM | POA: Insufficient documentation

## 2023-04-28 LAB — PULMONARY FUNCTION TEST ARMC ONLY
DL/VA % pred: 103 %
DL/VA: 4.18 ml/min/mmHg/L
DLCO unc % pred: 77 %
DLCO unc: 14.29 ml/min/mmHg
FEF 25-75 Post: 0.5 L/s
FEF 25-75 Pre: 0.89 L/s
FEF2575-%Change-Post: -43 %
FEF2575-%Pred-Post: 48 %
FEF2575-%Pred-Pre: 85 %
FEV1-%Change-Post: -19 %
FEV1-%Pred-Post: 49 %
FEV1-%Pred-Pre: 61 %
FEV1-Post: 0.84 L
FEV1-Pre: 1.04 L
FEV1FVC-%Change-Post: -13 %
FEV1FVC-%Pred-Pre: 78 %
FEV6-%Change-Post: -6 %
FEV6-%Pred-Post: 79 %
FEV6-%Pred-Pre: 84 %
FEV6-Post: 1.7 L
FEV6-Pre: 1.82 L
FEV6FVC-%Pred-Post: 106 %
FEV6FVC-%Pred-Pre: 106 %
FVC-%Change-Post: -6 %
FVC-%Pred-Post: 74 %
FVC-%Pred-Pre: 79 %
FVC-Post: 1.7 L
FVC-Pre: 1.82 L
Post FEV1/FVC ratio: 49 %
Post FEV6/FVC ratio: 100 %
Pre FEV1/FVC ratio: 57 %
Pre FEV6/FVC Ratio: 100 %
RV % pred: 83 %
RV: 2.12 L
TLC % pred: 76 %
TLC: 3.85 L

## 2023-04-28 MED ORDER — ALBUTEROL SULFATE (2.5 MG/3ML) 0.083% IN NEBU
2.5000 mg | INHALATION_SOLUTION | Freq: Once | RESPIRATORY_TRACT | Status: AC
  Administered 2023-04-28: 2.5 mg via RESPIRATORY_TRACT
  Filled 2023-04-28: qty 3

## 2023-05-02 DIAGNOSIS — Z79899 Other long term (current) drug therapy: Secondary | ICD-10-CM | POA: Diagnosis not present

## 2023-05-02 DIAGNOSIS — H353123 Nonexudative age-related macular degeneration, left eye, advanced atrophic without subfoveal involvement: Secondary | ICD-10-CM | POA: Diagnosis not present

## 2023-05-02 DIAGNOSIS — H353231 Exudative age-related macular degeneration, bilateral, with active choroidal neovascularization: Secondary | ICD-10-CM | POA: Diagnosis not present

## 2023-05-03 DIAGNOSIS — R296 Repeated falls: Secondary | ICD-10-CM | POA: Diagnosis not present

## 2023-05-03 DIAGNOSIS — R2681 Unsteadiness on feet: Secondary | ICD-10-CM | POA: Diagnosis not present

## 2023-05-03 DIAGNOSIS — R2689 Other abnormalities of gait and mobility: Secondary | ICD-10-CM | POA: Diagnosis not present

## 2023-05-05 ENCOUNTER — Ambulatory Visit: Payer: Medicare PPO | Admitting: Pulmonary Disease

## 2023-05-05 ENCOUNTER — Encounter: Payer: Self-pay | Admitting: Pulmonary Disease

## 2023-05-05 VITALS — BP 110/70 | HR 83 | Ht 63.75 in | Wt 125.0 lb

## 2023-05-05 DIAGNOSIS — R296 Repeated falls: Secondary | ICD-10-CM | POA: Diagnosis not present

## 2023-05-05 DIAGNOSIS — R2681 Unsteadiness on feet: Secondary | ICD-10-CM | POA: Diagnosis not present

## 2023-05-05 DIAGNOSIS — R0602 Shortness of breath: Secondary | ICD-10-CM | POA: Diagnosis not present

## 2023-05-05 DIAGNOSIS — R2689 Other abnormalities of gait and mobility: Secondary | ICD-10-CM | POA: Diagnosis not present

## 2023-05-05 MED ORDER — FLUTICASONE-SALMETEROL 250-50 MCG/ACT IN AEPB
1.0000 | INHALATION_SPRAY | Freq: Two times a day (BID) | RESPIRATORY_TRACT | 3 refills | Status: DC
Start: 2023-05-05 — End: 2023-07-28

## 2023-05-05 NOTE — Progress Notes (Signed)
 Synopsis: Referred in  by Judy Pimple, MD   Subjective:   PATIENT ID: Osker Mason GENDER: female DOB: 04-26-1933, MRN: [id number]  Chief Complaint  Patient presents with   Medical Management of Chronic Issues    HPI Ms. Schweizer is an 89 years old female patient with a past medical hisoty of essential hypertension presenting as a referral from her PCP for further work up of shortness of breath.   She reports for the past 2 month she started having shortness of breath on exertion. Associated with cough that is mostly dry. She reports that the dyspnea is intermittent and not present all the tim. She denies any chest pain/tightness/wheezing. She denies any weight loss or loss of appetite. She reports hypersensitivity to humidity and strong scents. She had covid last year.   05/05/2023 visit: I started her on breo during the last visit but caused her to have mucosal irritation and therefore stopped it. She continues to have shortness of breath on exertion and graspy voice.   PFTs 04/2023 do not meet criteria for ATS but did show some significant reduction in FEV-1 and low normal DLCO.   FH - No family history of pulmonary diseases   SH - Never smoker - No alcohol use - No illicit drug use. Worked as a Runner, broadcasting/film/video. Has 5 dogs at home.   ROS All systems were reviewed and are negative except for the above.  Objective:   Vitals:   05/05/23 1124  BP: 110/70  Pulse: 83  SpO2: 95%  Weight: 125 lb (56.7 kg)  Height: 5' 3.75" (1.619 m)   95% on RA BMI Readings from Last 3 Encounters:  05/05/23 21.62 kg/m  02/17/23 21.40 kg/m  12/30/22 20.59 kg/m   Wt Readings from Last 3 Encounters:  05/05/23 125 lb (56.7 kg)  02/17/23 123 lb 11.2 oz (56.1 kg)  12/30/22 119 lb (54 kg)    Physical Exam GEN: NAD, Healthy Appearing HEENT: Supple Neck, Reactive Pupils, EOMI  CVS: Normal S1, Normal S2, RRR, No murmurs or ES appreciated  Lungs: Clear bilateral air entry.  Abdomen: Soft, non  tender, non distended, + BS  Extremities: Warm and well perfused, No edema  Skin: No suspicious lesions appreciated  Psych: Normal Affect  Ancillary Information   CBC    Component Value Date/Time   WBC 4.7 04/19/2023 0841   RBC 4.59 04/19/2023 0841   HGB 12.9 04/19/2023 0841   HGB 12.1 03/18/2021 1213   HCT 39.2 04/19/2023 0841   PLT 210.0 04/19/2023 0841   PLT 244 03/18/2021 1213   MCV 85.4 04/19/2023 0841   MCH 27.9 03/18/2021 1213   MCHC 32.9 04/19/2023 0841   RDW 14.1 04/19/2023 0841   LYMPHSABS 1.3 04/19/2023 0841   MONOABS 0.4 04/19/2023 0841   EOSABS 0.1 04/19/2023 0841   BASOSABS 0.1 04/19/2023 0841    Imaging  CXR 08/2022 showed increased diffuse reticular opacities/Vascular prominence. With hyperinflation.     Latest Ref Rng & Units 04/28/2023    3:30 PM  PFT Results  FVC-Pre L 1.82  P  FVC-Predicted Pre % 79  P  FVC-Post L 1.70  P  FVC-Predicted Post % 74  P  Pre FEV1/FVC % % 57  P  Post FEV1/FCV % % 49  P  FEV1-Pre L 1.04  P  FEV1-Predicted Pre % 61  P  FEV1-Post L 0.84  P  DLCO uncorrected ml/min/mmHg 14.29  P  DLCO UNC% % 77  P  DLVA  Predicted % 103  P  TLC L 3.85  P  TLC % Predicted % 76  P  RV % Predicted % 83  P    P Preliminary result     Assessment & Plan:  Ms. Labarbera is an 89 years old female patient with a past medical hisoty of essential hypertension presenting as a referral from her PCP for further work up of shortness of breath.   #Shorntness of breath likely in the setting of reactive airway disease/Adult onset asthma post covid infection. However, she does have reticular opacities on her CXR and hyperinflation without a history of smoking. CT chest wo contrast 11/2022 withminimal chronic scarring from radiation therapy for breast cancer in the past.   She did have her vocal cords checked by ENT 2 years ago which appeared to be normal,   Ongoing shortness of breath , decreased FEV-1, I am suspecting post covid reactive airway disease.    []  switch Fluticasone-Vilanterol [Breo] 100 1 puff daily to wixel 250 1puff bid. Mouth care post use was advised.  []  Albuterol 2 puffs Q6H as needed    RTC 6 months.   I spent 20 minutes caring for this patient today, including preparing to see the patient, obtaining a medical history , reviewing a separately obtained history, performing a medically appropriate examination and/or evaluation, counseling and educating the patient/family/caregiver, ordering medications, tests, or procedures, documenting clinical information in the electronic health record, and independently interpreting results (not separately reported/billed) and communicating results to the patient/family/caregiver  Janann Colonel, MD [facility] 05/05/2023 11:28 AM

## 2023-05-09 ENCOUNTER — Telehealth: Payer: Self-pay

## 2023-05-09 NOTE — Telephone Encounter (Signed)
 E2C2 relayed information to patient per the note from [facility] Boris Byars however patient firmly states the inhaler is not defective, she just would like to speak with nurse about directions for her inhaler.   I spoke with the patient. She said she took her medication this morning and the dose reader still says 59, it did not go down. She was asking is she was taking the medication correctly. I told her she should take the inhaler to her pharmacy and have them show her how to use the inhaler correctly and make sure the device is working properly since the dose amount is not going down. She will take the inhaler to her pharmacy.  Nothing further needed.

## 2023-05-09 NOTE — Telephone Encounter (Signed)
 This would be a question for the pharmacy. If the dose is not changing it could be a defective inhaler.  LMTCB. E2C2 please advise when patient calls back.

## 2023-05-09 NOTE — Telephone Encounter (Signed)
 Copied from CRM [id number]. Topic: Clinical - Medical Advice >> May 09, 2023  9:50 AM Tyronne Galloway wrote: Reason for CRM: Patient called and stated she picked up her inhaler on 4/10 and started using it, however when she uses it the number visible does not change at all. She is requesting a call back for advice if she's using the inhaler correctly and/or if she needs to get his one replaced due to possibly being defective. Please call the patient back at [phone].

## 2023-05-10 DIAGNOSIS — R296 Repeated falls: Secondary | ICD-10-CM | POA: Diagnosis not present

## 2023-05-10 DIAGNOSIS — R2681 Unsteadiness on feet: Secondary | ICD-10-CM | POA: Diagnosis not present

## 2023-05-10 DIAGNOSIS — R2689 Other abnormalities of gait and mobility: Secondary | ICD-10-CM | POA: Diagnosis not present

## 2023-05-11 DIAGNOSIS — Z853 Personal history of malignant neoplasm of breast: Secondary | ICD-10-CM | POA: Diagnosis not present

## 2023-05-11 DIAGNOSIS — R922 Inconclusive mammogram: Secondary | ICD-10-CM | POA: Diagnosis not present

## 2023-05-11 DIAGNOSIS — Z08 Encounter for follow-up examination after completed treatment for malignant neoplasm: Secondary | ICD-10-CM | POA: Diagnosis not present

## 2023-05-12 ENCOUNTER — Encounter: Payer: Self-pay | Admitting: Family Medicine

## 2023-05-12 DIAGNOSIS — R2689 Other abnormalities of gait and mobility: Secondary | ICD-10-CM | POA: Diagnosis not present

## 2023-05-12 DIAGNOSIS — R2681 Unsteadiness on feet: Secondary | ICD-10-CM | POA: Diagnosis not present

## 2023-05-12 DIAGNOSIS — R296 Repeated falls: Secondary | ICD-10-CM | POA: Diagnosis not present

## 2023-05-17 ENCOUNTER — Encounter: Admitting: Family Medicine

## 2023-05-17 DIAGNOSIS — R2681 Unsteadiness on feet: Secondary | ICD-10-CM | POA: Diagnosis not present

## 2023-05-17 DIAGNOSIS — R2689 Other abnormalities of gait and mobility: Secondary | ICD-10-CM | POA: Diagnosis not present

## 2023-05-17 DIAGNOSIS — R296 Repeated falls: Secondary | ICD-10-CM | POA: Diagnosis not present

## 2023-05-19 DIAGNOSIS — R296 Repeated falls: Secondary | ICD-10-CM | POA: Diagnosis not present

## 2023-05-19 DIAGNOSIS — R2689 Other abnormalities of gait and mobility: Secondary | ICD-10-CM | POA: Diagnosis not present

## 2023-05-19 DIAGNOSIS — R2681 Unsteadiness on feet: Secondary | ICD-10-CM | POA: Diagnosis not present

## 2023-05-20 DIAGNOSIS — N6011 Diffuse cystic mastopathy of right breast: Secondary | ICD-10-CM | POA: Diagnosis not present

## 2023-05-20 DIAGNOSIS — Z853 Personal history of malignant neoplasm of breast: Secondary | ICD-10-CM | POA: Diagnosis not present

## 2023-05-20 DIAGNOSIS — N6012 Diffuse cystic mastopathy of left breast: Secondary | ICD-10-CM | POA: Diagnosis not present

## 2023-05-24 DIAGNOSIS — R296 Repeated falls: Secondary | ICD-10-CM | POA: Diagnosis not present

## 2023-05-24 DIAGNOSIS — R2689 Other abnormalities of gait and mobility: Secondary | ICD-10-CM | POA: Diagnosis not present

## 2023-05-24 DIAGNOSIS — R2681 Unsteadiness on feet: Secondary | ICD-10-CM | POA: Diagnosis not present

## 2023-05-26 DIAGNOSIS — R296 Repeated falls: Secondary | ICD-10-CM | POA: Diagnosis not present

## 2023-05-26 DIAGNOSIS — R2681 Unsteadiness on feet: Secondary | ICD-10-CM | POA: Diagnosis not present

## 2023-05-26 DIAGNOSIS — R2689 Other abnormalities of gait and mobility: Secondary | ICD-10-CM | POA: Diagnosis not present

## 2023-05-27 ENCOUNTER — Encounter: Payer: Self-pay | Admitting: Adult Health

## 2023-05-31 DIAGNOSIS — R2689 Other abnormalities of gait and mobility: Secondary | ICD-10-CM | POA: Diagnosis not present

## 2023-05-31 DIAGNOSIS — R2681 Unsteadiness on feet: Secondary | ICD-10-CM | POA: Diagnosis not present

## 2023-05-31 DIAGNOSIS — R296 Repeated falls: Secondary | ICD-10-CM | POA: Diagnosis not present

## 2023-06-02 ENCOUNTER — Other Ambulatory Visit: Payer: Self-pay | Admitting: Family Medicine

## 2023-06-02 DIAGNOSIS — R296 Repeated falls: Secondary | ICD-10-CM | POA: Diagnosis not present

## 2023-06-02 DIAGNOSIS — R2689 Other abnormalities of gait and mobility: Secondary | ICD-10-CM | POA: Diagnosis not present

## 2023-06-02 DIAGNOSIS — R2681 Unsteadiness on feet: Secondary | ICD-10-CM | POA: Diagnosis not present

## 2023-06-02 NOTE — Telephone Encounter (Signed)
 Last appt was 09/15/2022 for a sick visit. BP was in good standing. Pt has appt scheduled for 06/08/2023 for a physical. Okay to fill prescription at this time?

## 2023-06-03 DIAGNOSIS — L920 Granuloma annulare: Secondary | ICD-10-CM | POA: Diagnosis not present

## 2023-06-03 DIAGNOSIS — D2261 Melanocytic nevi of right upper limb, including shoulder: Secondary | ICD-10-CM | POA: Diagnosis not present

## 2023-06-03 DIAGNOSIS — D692 Other nonthrombocytopenic purpura: Secondary | ICD-10-CM | POA: Diagnosis not present

## 2023-06-03 DIAGNOSIS — D2262 Melanocytic nevi of left upper limb, including shoulder: Secondary | ICD-10-CM | POA: Diagnosis not present

## 2023-06-07 ENCOUNTER — Encounter: Admitting: Family Medicine

## 2023-06-07 DIAGNOSIS — R296 Repeated falls: Secondary | ICD-10-CM | POA: Diagnosis not present

## 2023-06-07 DIAGNOSIS — R2689 Other abnormalities of gait and mobility: Secondary | ICD-10-CM | POA: Diagnosis not present

## 2023-06-07 DIAGNOSIS — R2681 Unsteadiness on feet: Secondary | ICD-10-CM | POA: Diagnosis not present

## 2023-06-08 ENCOUNTER — Ambulatory Visit (INDEPENDENT_AMBULATORY_CARE_PROVIDER_SITE_OTHER): Admitting: Family Medicine

## 2023-06-08 ENCOUNTER — Encounter: Payer: Self-pay | Admitting: Family Medicine

## 2023-06-08 VITALS — BP 110/60 | HR 82 | Temp 98.0°F | Ht 63.25 in | Wt 125.0 lb

## 2023-06-08 DIAGNOSIS — Z79899 Other long term (current) drug therapy: Secondary | ICD-10-CM | POA: Diagnosis not present

## 2023-06-08 DIAGNOSIS — M810 Age-related osteoporosis without current pathological fracture: Secondary | ICD-10-CM

## 2023-06-08 DIAGNOSIS — M069 Rheumatoid arthritis, unspecified: Secondary | ICD-10-CM

## 2023-06-08 DIAGNOSIS — Z0000 Encounter for general adult medical examination without abnormal findings: Secondary | ICD-10-CM | POA: Diagnosis not present

## 2023-06-08 DIAGNOSIS — R202 Paresthesia of skin: Secondary | ICD-10-CM

## 2023-06-08 DIAGNOSIS — I10 Essential (primary) hypertension: Secondary | ICD-10-CM

## 2023-06-08 DIAGNOSIS — R7303 Prediabetes: Secondary | ICD-10-CM

## 2023-06-08 DIAGNOSIS — L920 Granuloma annulare: Secondary | ICD-10-CM

## 2023-06-08 DIAGNOSIS — E7800 Pure hypercholesterolemia, unspecified: Secondary | ICD-10-CM | POA: Diagnosis not present

## 2023-06-08 DIAGNOSIS — R252 Cramp and spasm: Secondary | ICD-10-CM

## 2023-06-08 DIAGNOSIS — H353211 Exudative age-related macular degeneration, right eye, with active choroidal neovascularization: Secondary | ICD-10-CM | POA: Diagnosis not present

## 2023-06-08 NOTE — Patient Instructions (Addendum)
 Low magnesium can cause both leg and foot cramps and neuropathy symptoms  You can take a lot dose of magnesium over the counter 250 mg daily  It can cause diarrhea so be aware of this   See Dr Clydie Darter for your feet (acupuncture) if you want to try that  Read about neuropathy- here is a hand out  If it does not help we will get you to a neurologist   Let me know if sore throat or headache persist    Your vitamin D  level is low  Make sure you get at least 5000 international units of vitamin d3 daily

## 2023-06-08 NOTE — Assessment & Plan Note (Signed)
 Disc goals for lipids and reasons to control them Rev last labs with pt Rev low sat fat diet in detail Overall fairly stable with diet control

## 2023-06-08 NOTE — Assessment & Plan Note (Signed)
 bp in fair control at this time  BP Readings from Last 1 Encounters:  06/08/23 110/60   No changes needed Most recent labs reviewed  Disc lifstyle change with low sodium diet and exercise   Continues losartan  50-12.5 mg half pill daily  Labs reviewed

## 2023-06-08 NOTE — Assessment & Plan Note (Signed)
 Continues oph care No clinical changes

## 2023-06-08 NOTE — Assessment & Plan Note (Signed)
 Pt takes protonix  20 mg  daily  Lab Results  Component Value Date   VITAMINB12 994 (H) 04/19/2023   Last vitamin D  Lab Results  Component Value Date   VD25OH 26.04 (L) 04/19/2023

## 2023-06-08 NOTE — Assessment & Plan Note (Signed)
 Unsure if related to paresthesias  Taking mustard Encouraged to try a low dose magnesium supplement over the counter Also stretching and good hydration

## 2023-06-08 NOTE — Assessment & Plan Note (Signed)
 Dexa 01/2023 Intol of alendronate and boniva Declines other treatments incl prolia  One fall/no fracture Discussed fall prevention, supplements and exercise for bone density  Strongly encouraged her to increase vit D intake

## 2023-06-08 NOTE — Assessment & Plan Note (Addendum)
 New  Pins and needles feeling Occational cramps at night  No B12 def D is low-encouraged to supplement   Pt wants to see chiropractor that did acupuncture on her husband If not improved would consider neurology referral   Encouraged a trial of low dose magnesium over the counter   Given handout on periph neuropathy  Noted history of RA in past as well

## 2023-06-08 NOTE — Assessment & Plan Note (Signed)
 Lab Results  Component Value Date   HGBA1C 5.6 04/19/2023   HGBA1C 5.6 04/13/2022   HGBA1C 5.4 03/19/2021   disc imp of low glycemic diet and wt loss to prevent DM2

## 2023-06-08 NOTE — Assessment & Plan Note (Signed)
 New dx on arms/elbow area Under derm care Had bx with results pending/also labs Will be starting plaquenil soon most likely

## 2023-06-08 NOTE — Assessment & Plan Note (Signed)
 Reviewed health habits including diet and exercise and skin cancer prevention Reviewed appropriate screening tests for age  Also reviewed health mt list, fam hx and immunization status , as well as social and family history   See HPI Labs reviewed and ordered Health Maintenance  Topic Date Due   COVID-19 Vaccine (3 - Moderna risk series) 05/08/2019   Flu Shot  08/26/2023   Medicare Annual Wellness Visit  12/27/2023   DTaP/Tdap/Td vaccine (3 - Tdap) 05/18/2026   Pneumonia Vaccine  Completed   DEXA scan (bone density measurement)  Completed   Zoster (Shingles) Vaccine  Completed   HPV Vaccine  Aged Out   Meningitis B Vaccine  Aged Out   Mammogram  Discontinued    Still sees gyn yearly for exam  Discussed fall prevention, supplements and exercise for bone density  Declines treatment for osteoporosis  Good exercise habits Utd derm care PHQ 6 today - recommend follow up for this as well as some acute issues (headache, st)

## 2023-06-08 NOTE — Assessment & Plan Note (Signed)
 No current treatment   ? If adding to her neuropathy symptoms

## 2023-06-08 NOTE — Progress Notes (Signed)
 Subjective:    Patient ID: Sierra Bentley, female    DOB: [birth year], 89 y.o.   MRN: [id number]  HPI  Here for health maintenance exam and to review chronic medical problems   Wt Readings from Last 3 Encounters:  06/08/23 125 lb (56.7 kg)  05/05/23 125 lb (56.7 kg)  02/17/23 123 lb 11.2 oz (56.1 kg)   21.97 kg/m  Vitals:   06/08/23 1415  BP: 110/60  Pulse: 82  Temp: 98 F (36.7 C)  SpO2: 92%    Immunization History  Administered Date(s) Administered   Fluad Quad(high Dose 65+) 11/14/2018, 12/17/2019, 10/30/2021   Influenza Split 11/30/2010, 12/16/2011   Influenza Whole 10/26/2007   Influenza, High Dose Seasonal PF 02/24/2023   Influenza,inj,Quad PF,6+ Mos 11/09/2012, 11/16/2013, 01/03/2015, 11/06/2015, 11/26/2016, 12/02/2017   Influenza-Unspecified 12/31/2020   Moderna Sars-Covid-2 Vaccination 03/13/2019, 04/10/2019   Pneumococcal Conjugate-13 07/25/2013   Pneumococcal Polysaccharide-23 11/30/2010   Td 01/25/2005, 05/17/2016   Zoster Recombinant(Shingrix) 09/03/2020, 07/26/2022   Zoster, Live 03/29/2008    Health Maintenance Due  Topic Date Due   COVID-19 Vaccine (3 - Moderna risk series) 05/08/2019   Some stress  Family issues , son in a divorce, another son taking care of his MIL with alzheimers 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   Some trouble with her feet Numb/ pins and needles feeling  Cramping -makes her big toe pop up  Wants to make appointment with neurology in [place]  Affects her gait  Some bad headaches / worse than in the past     Mammogram 04/2023 normal- had a diagnostic mammo this year and several us  = still very dense breasts  Had to use contrast for imaging and it came out ok (relieved)  Personal history of breast cancer  Self breast exam-no lumps or changes   Gyn health Had visit this year  Found a cyst under right breast and it is better now    Colon cancer screening -out aged   Bone health  Dexa  01/2023  Osteoporosis  Intol of alendronate and boniva in past  Declines prolia or other medicines  Falls-one / fell backwards in kitchen   (has done PT for balance)  Lizabeth Riggs  Supplements  Last vitamin D  Lab Results  Component Value Date   VD25OH 26.04 (L) 04/19/2023    Exercise  Does balance training /PT  Has an exercise room  Uses an exercise bike  Also uses weights  Walks a lot    Sees dermatologist for regular care Told she had granuloma annulare on elbows Had bx this am and also labs  May be starting on plaquenil    Mood    06/08/2023    2:18 PM 12/27/2022   11:10 AM 12/23/2021   10:37 AM 12/16/2020    2:51 PM 12/11/2019    3:38 PM  Depression screen PHQ 2/9  Decreased Interest 0 0 0 0 0  Down, Depressed, Hopeless 1 0 0 0 0  PHQ - 2 Score 1 0 0 0 0  Altered sleeping 1  0  0  Tired, decreased energy 1  0  0  Change in appetite 1  0  0  Feeling bad or failure about yourself  0  0  0  Trouble concentrating 1  0  0  Moving slowly or fidgety/restless 1  0  0  Suicidal thoughts 0  0  0  PHQ-9 Score 6  0  0  Difficult doing work/chores  Somewhat difficult  Not difficult at all  Not difficult at all   HTN bp is stable today  No cp or palpitations or headaches or edema  No side effects to medicines  BP Readings from Last 3 Encounters:  06/08/23 110/60  05/05/23 110/70  02/17/23 (!) 136/58    Losartan  50-12.5 mg 1/2 pill daily   Lab Results  Component Value Date   NA 139 04/19/2023   K 4.3 04/19/2023   CO2 33 (H) 04/19/2023   GLUCOSE 98 04/19/2023   BUN 18 04/19/2023   CREATININE 0.74 04/19/2023   CALCIUM 9.8 04/19/2023   GFR 72.79 04/19/2023   GFRNONAA >60 04/03/2021   Continues oph care for macular degeneration   GERD Protonix  20 mg daily   Lab Results  Component Value Date   VITAMINB12 994 (H) 04/19/2023    Hyperlipidemia Lab Results  Component Value Date   CHOL 184 04/19/2023   CHOL 199 04/13/2022   CHOL 165 03/19/2021   Lab  Results  Component Value Date   HDL 61.90 04/19/2023   HDL 72.90 04/13/2022   HDL 65.10 03/19/2021   Lab Results  Component Value Date   LDLCALC 112 (H) 04/19/2023   LDLCALC 111 (H) 04/13/2022   LDLCALC 87 03/19/2021   Lab Results  Component Value Date   TRIG 53.0 04/19/2023   TRIG 78.0 04/13/2022   TRIG 66.0 03/19/2021   Lab Results  Component Value Date   CHOLHDL 3 04/19/2023   CHOLHDL 3 04/13/2022   CHOLHDL 3 03/19/2021   Lab Results  Component Value Date   LDLDIRECT 130.4 02/25/2012   LDLDIRECT 134.1 08/18/2011   LDLDIRECT 155.9 11/30/2010   Diet controlled   Prediabetes Lab Results  Component Value Date   HGBA1C 5.6 04/19/2023   HGBA1C 5.6 04/13/2022   HGBA1C 5.4 03/19/2021    Lab Results  Component Value Date   ALT 13 04/19/2023   AST 16 04/19/2023   ALKPHOS 48 04/19/2023   BILITOT 0.6 04/19/2023   Lab Results  Component Value Date   WBC 4.7 04/19/2023   HGB 12.9 04/19/2023   HCT 39.2 04/19/2023   MCV 85.4 04/19/2023   PLT 210.0 04/19/2023   Lab Results  Component Value Date   TSH 1.76 04/19/2023        Patient Active Problem List   Diagnosis Date Noted   Granuloma annulare 06/08/2023   Fatigue 09/15/2022   History of fall 05/20/2022   Sacral pain 05/20/2022   SOB (shortness of breath) on exertion 05/20/2022   Grief reaction 06/14/2021   Current use of proton pump inhibitor 03/19/2021   Family history of prostate cancer 03/18/2021   Malignant neoplasm of upper-outer quadrant of left breast in female, estrogen receptor negative (HCC) 03/17/2021   Poor balance 05/07/2019   Exudative age-related macular degeneration, right eye, with active choroidal neovascularization (HCC) 09/04/2018   History of hip fracture 02/27/2018   Paresthesia of both feet 10/07/2017   Pedal edema 09/28/2017   Prediabetes 11/30/2016   At moderate risk for fall 09/05/2015   Cervical spondylosis without myelopathy 08/05/2015   Routine general medical  examination at a health care facility 08/27/2014   Colon cancer screening 08/27/2014   Encounter for Medicare annual wellness exam 07/25/2013   Varicosities of leg 05/23/2012   Dry eye syndrome 04/14/2012   Nonexudative senile macular degeneration of retina 04/14/2012   Fuchs' corneal dystrophy 04/14/2012   Pseudophakia 04/14/2012   Abnormal ultrasound of thyroid  gland 03/01/2011   Leg  cramps 05/27/2009   Hyperlipidemia 06/11/2008   Allergic rhinitis 03/29/2008   Essential hypertension, benign 12/26/2006   GERD 12/26/2006   Rheumatoid arthritis (HCC) 12/26/2006   Osteoporosis 12/26/2006   URINARY INCONTINENCE 12/26/2006   Past Medical History:  Diagnosis Date   Allergic rhinitis, cause unspecified    Anemia    Breast cancer (HCC)    left breast IDC 2023   Chronically dry eyes, bilateral    Diverticulosis of colon    Endometrial polyp    Family history of prostate cancer 03/18/2021   Fuchs' corneal dystrophy of right eye    GERD (gastroesophageal reflux disease)    Hearing loss of both ears    pt stated has hearing aids but does not wear   History of epistaxis    per pt has had several cautization's for nose bleed   History of radiation therapy    05/20/21-06/10/21- Dr. Retta Caster   HTN (hypertension)    Hyperlipidemia    Macular degeneration of both eyes    followed @[facility]  by dr Arlester Ladd. mettu;   both age-related and right is with active choroidal neovascularization   OA (osteoarthritis)    OAB (overactive bladder)    Osteopenia    Thickened endometrium    Urge incontinence of urine    Wears glasses    Past Surgical History:  Procedure Laterality Date   BREAST BIOPSY Left 2011   fibrocystic change   BREAST LUMPECTOMY WITH RADIOACTIVE SEED LOCALIZATION Left 04/13/2021   Procedure: LEFT BREAST LUMPECTOMY WITH RADIOACTIVE SEED LOCALIZATION;  Surgeon: Enid Harry, MD;  Location: [facility];  Service: General;  Laterality: Left;   CATARACT EXTRACTION W/  INTRAOCULAR LENS IMPLANT Bilateral 2008   COLONOSCOPY  2013   DILATATION & CURETTAGE/HYSTEROSCOPY WITH MYOSURE N/A 11/26/2020   Procedure: DILATATION & CURETTAGE/HYSTEROSCOPY WITH MYOSURE;  Surgeon: Lavoie, Marie-Lyne, MD;  Location: [facility];  Service: Gynecology;  Laterality: N/A;   TONSILLECTOMY  1962   Social History   Tobacco Use   Smoking status: Never    Passive exposure: Never   Smokeless tobacco: Never  Vaping Use   Vaping status: Never Used  Substance Use Topics   Alcohol use: No    Alcohol/week: 0.0 standard drinks of alcohol   Drug use: Never   Family History  Problem Relation Age of Onset   Other Mother        Arrythmia   Coronary artery disease Mother    Hypertension Mother    Anxiety disorder Mother    Heart attack Father 69   Coronary artery disease Father    Prostate cancer Father        dx unknown age   Throat cancer Brother        d. 54   Cancer Other        two maternal female cousins; unknown cancer; dx after 9   Depression Other        family history   Allergies  Allergen Reactions   Ace Inhibitors Cough   Boniva [Ibandronate]     Esophageal distress   Chocolate Other (See Comments)    Severe headache and stopped up sinuses   Fosamax [Alendronate]     Esophageal distress   Oxycodone Other (See Comments)    Dizziness resulting in fall    Current Outpatient Medications on File Prior to Visit  Medication Sig Dispense Refill   fluticasone -salmeterol (WIXELA INHUB) 250-50 MCG/ACT AEPB Inhale 1 puff into the lungs in the morning  and at bedtime. 60 each 3   latanoprost (XALATAN) 0.005 % ophthalmic solution      losartan -hydrochlorothiazide  (HYZAAR) 50-12.5 MG tablet TAKE 1/2 TABLET BY MOUTH EVERY EVENING 45 tablet 3   Multiple Vitamin (MULTIVITAMIN) tablet Take 1 tablet by mouth daily. Per pt has Postmenopausal multivitamin packet with 5 pills     NON FORMULARY HD ? Injection for macular in eye     pantoprazole  (PROTONIX ) 20 MG  tablet TAKE 1 TABLET BY MOUTH EVERY DAY 90 tablet 3   Polyethyl Glycol-Propyl Glycol (SYSTANE ULTRA OP) Place 1 drop into both eyes 4 (four) times daily.     Probiotic Product (ALIGN EXTRA STRENGTH PO) Take by mouth.     sodium chloride  (MURO 128) 5 % ophthalmic ointment Place 1 application  into both eyes at bedtime.  Uses Muro eye drops during the day as well     TURMERIC PO Take 1,300 mg by mouth in the morning and at bedtime.     No current facility-administered medications on file prior to visit.    Review of Systems  Constitutional:  Positive for fatigue. Negative for activity change, appetite change, fever and unexpected weight change.  HENT:  Positive for sore throat. Negative for congestion, ear pain, rhinorrhea and sinus pressure.   Eyes:  Positive for visual disturbance. Negative for pain and redness.  Respiratory:  Negative for cough, shortness of breath and wheezing.   Cardiovascular:  Negative for chest pain and palpitations.  Gastrointestinal:  Negative for abdominal pain, blood in stool, constipation and diarrhea.  Endocrine: Negative for polydipsia and polyuria.  Genitourinary:  Negative for dysuria, frequency and urgency.  Musculoskeletal:  Negative for arthralgias, back pain and myalgias.  Skin:  Positive for rash. Negative for pallor.  Allergic/Immunologic: Negative for environmental allergies.  Neurological:  Positive for headaches. Negative for dizziness and syncope.       Poor balance Not dizzy    Pins and needles sensation in feet when sitting   Hematological:  Negative for adenopathy. Does not bruise/bleed easily.  Psychiatric/Behavioral:  Negative for decreased concentration and dysphoric mood. The patient is not nervous/anxious.        Objective:   Physical Exam Constitutional:      General: She is not in acute distress.    Appearance: Normal appearance. She is well-developed and normal weight. She is not ill-appearing or diaphoretic.  HENT:     Head:  Normocephalic and atraumatic.     Right Ear: Tympanic membrane, ear canal and external ear normal.     Left Ear: Tympanic membrane, ear canal and external ear normal.     Nose: Nose normal. No congestion.     Mouth/Throat:     Mouth: Mucous membranes are moist.     Pharynx: Oropharynx is clear. No posterior oropharyngeal erythema.  Eyes:     General: No scleral icterus.    Extraocular Movements: Extraocular movements intact.     Conjunctiva/sclera: Conjunctivae normal.     Pupils: Pupils are equal, round, and reactive to light.  Neck:     Thyroid : No thyromegaly.     Vascular: No carotid bruit or JVD.  Cardiovascular:     Rate and Rhythm: Normal rate and regular rhythm.     Pulses: Normal pulses.     Heart sounds: Normal heart sounds.     No gallop.  Pulmonary:     Effort: Pulmonary effort is normal. No respiratory distress.     Breath sounds: Normal breath sounds. No wheezing.  Comments: Good air exch Chest:     Chest wall: No tenderness.  Abdominal:     General: Bowel sounds are normal. There is no distension or abdominal bruit.     Palpations: Abdomen is soft. There is no mass.     Tenderness: There is no abdominal tenderness.     Hernia: No hernia is present.  Genitourinary:    Comments: Breast and pelvic exam are done by gyn provider   Musculoskeletal:        General: No tenderness. Normal range of motion.     Cervical back: Normal range of motion and neck supple. No rigidity. No muscular tenderness.     Right lower leg: No edema.     Left lower leg: No edema.     Comments: No kyphosis   Lymphadenopathy:     Cervical: No cervical adenopathy.  Skin:    General: Skin is warm and dry.     Coloration: Skin is not pale.     Findings: No erythema or rash.     Comments: Pink / maculopapular raised rash on elbows and surrounding arms  Solar lentigines diffusely   Neurological:     Mental Status: She is alert. Mental status is at baseline.     Cranial Nerves: No  cranial nerve deficit.     Motor: No abnormal muscle tone.     Coordination: Coordination normal.     Gait: Gait normal.     Deep Tendon Reflexes: Reflexes are normal and symmetric. Reflexes normal.  Psychiatric:        Mood and Affect: Mood normal.        Cognition and Memory: Cognition and memory normal.           Assessment & Plan:   Problem List Items Addressed This Visit       Cardiovascular and Mediastinum   Exudative age-related macular degeneration, right eye, with active choroidal neovascularization (HCC)   Continues oph care No clinical changes       Essential hypertension, benign   bp in fair control at this time  BP Readings from Last 1 Encounters:  06/08/23 110/60   No changes needed Most recent labs reviewed  Disc lifstyle change with low sodium diet and exercise   Continues losartan  50-12.5 mg half pill daily  Labs reviewed         Musculoskeletal and Integument   Rheumatoid arthritis (HCC)   No current treatment   ? If adding to her neuropathy symptoms       Osteoporosis   Dexa 01/2023 Intol of alendronate and boniva Declines other treatments incl prolia  One fall/no fracture Discussed fall prevention, supplements and exercise for bone density  Strongly encouraged her to increase vit D intake       Granuloma annulare   New dx on arms/elbow area Under derm care Had bx with results pending/also labs Will be starting plaquenil soon most likely        Other   Routine general medical examination at a health care facility - Primary   Reviewed health habits including diet and exercise and skin cancer prevention Reviewed appropriate screening tests for age  Also reviewed health mt list, fam hx and immunization status , as well as social and family history   See HPI Labs reviewed and ordered Health Maintenance  Topic Date Due   COVID-19 Vaccine (3 - Moderna risk series) 05/08/2019   Flu Shot  08/26/2023   Medicare Annual Wellness Visit  12/27/2023   DTaP/Tdap/Td vaccine (3 - Tdap) 05/18/2026   Pneumonia Vaccine  Completed   DEXA scan (bone density measurement)  Completed   Zoster (Shingles) Vaccine  Completed   HPV Vaccine  Aged Out   Meningitis B Vaccine  Aged Out   Mammogram  Discontinued    Still sees gyn yearly for exam  Discussed fall prevention, supplements and exercise for bone density  Declines treatment for osteoporosis  Good exercise habits Utd derm care PHQ 6 today - recommend follow up for this as well as some acute issues (headache, st)        Prediabetes   Lab Results  Component Value Date   HGBA1C 5.6 04/19/2023   HGBA1C 5.6 04/13/2022   HGBA1C 5.4 03/19/2021   disc imp of low glycemic diet and wt loss to prevent DM2       Paresthesia of both feet   New  Pins and needles feeling Occational cramps at night  No B12 def D is low-encouraged to supplement   Pt wants to see chiropractor that did acupuncture on her husband If not improved would consider neurology referral   Encouraged a trial of low dose magnesium over the counter   Given handout on periph neuropathy  Noted history of RA in past as well      Leg cramps   Unsure if related to paresthesias  Taking mustard Encouraged to try a low dose magnesium supplement over the counter Also stretching and good hydration       Hyperlipidemia   Disc goals for lipids and reasons to control them Rev last labs with pt Rev low sat fat diet in detail Overall fairly stable with diet control      Current use of proton pump inhibitor   Pt takes protonix  20 mg  daily  Lab Results  Component Value Date   VITAMINB12 994 (H) 04/19/2023   Last vitamin D  Lab Results  Component Value Date   VD25OH 26.04 (L) 04/19/2023

## 2023-06-09 DIAGNOSIS — R2681 Unsteadiness on feet: Secondary | ICD-10-CM | POA: Diagnosis not present

## 2023-06-09 DIAGNOSIS — R296 Repeated falls: Secondary | ICD-10-CM | POA: Diagnosis not present

## 2023-06-09 DIAGNOSIS — R2689 Other abnormalities of gait and mobility: Secondary | ICD-10-CM | POA: Diagnosis not present

## 2023-06-13 ENCOUNTER — Ambulatory Visit: Payer: Self-pay | Admitting: Pulmonary Disease

## 2023-06-14 DIAGNOSIS — R296 Repeated falls: Secondary | ICD-10-CM | POA: Diagnosis not present

## 2023-06-14 DIAGNOSIS — R2681 Unsteadiness on feet: Secondary | ICD-10-CM | POA: Diagnosis not present

## 2023-06-14 DIAGNOSIS — R2689 Other abnormalities of gait and mobility: Secondary | ICD-10-CM | POA: Diagnosis not present

## 2023-06-16 DIAGNOSIS — R296 Repeated falls: Secondary | ICD-10-CM | POA: Diagnosis not present

## 2023-06-16 DIAGNOSIS — R2689 Other abnormalities of gait and mobility: Secondary | ICD-10-CM | POA: Diagnosis not present

## 2023-06-16 DIAGNOSIS — R2681 Unsteadiness on feet: Secondary | ICD-10-CM | POA: Diagnosis not present

## 2023-06-21 DIAGNOSIS — R296 Repeated falls: Secondary | ICD-10-CM | POA: Diagnosis not present

## 2023-06-21 DIAGNOSIS — R2681 Unsteadiness on feet: Secondary | ICD-10-CM | POA: Diagnosis not present

## 2023-06-21 DIAGNOSIS — R2689 Other abnormalities of gait and mobility: Secondary | ICD-10-CM | POA: Diagnosis not present

## 2023-06-23 DIAGNOSIS — R2681 Unsteadiness on feet: Secondary | ICD-10-CM | POA: Diagnosis not present

## 2023-06-23 DIAGNOSIS — R2689 Other abnormalities of gait and mobility: Secondary | ICD-10-CM | POA: Diagnosis not present

## 2023-06-23 DIAGNOSIS — R296 Repeated falls: Secondary | ICD-10-CM | POA: Diagnosis not present

## 2023-06-27 DIAGNOSIS — H353231 Exudative age-related macular degeneration, bilateral, with active choroidal neovascularization: Secondary | ICD-10-CM | POA: Diagnosis not present

## 2023-06-28 DIAGNOSIS — R2689 Other abnormalities of gait and mobility: Secondary | ICD-10-CM | POA: Diagnosis not present

## 2023-06-28 DIAGNOSIS — R296 Repeated falls: Secondary | ICD-10-CM | POA: Diagnosis not present

## 2023-06-28 DIAGNOSIS — R2681 Unsteadiness on feet: Secondary | ICD-10-CM | POA: Diagnosis not present

## 2023-06-30 DIAGNOSIS — R296 Repeated falls: Secondary | ICD-10-CM | POA: Diagnosis not present

## 2023-06-30 DIAGNOSIS — R2689 Other abnormalities of gait and mobility: Secondary | ICD-10-CM | POA: Diagnosis not present

## 2023-06-30 DIAGNOSIS — R2681 Unsteadiness on feet: Secondary | ICD-10-CM | POA: Diagnosis not present

## 2023-07-05 DIAGNOSIS — R2681 Unsteadiness on feet: Secondary | ICD-10-CM | POA: Diagnosis not present

## 2023-07-05 DIAGNOSIS — R296 Repeated falls: Secondary | ICD-10-CM | POA: Diagnosis not present

## 2023-07-05 DIAGNOSIS — R2689 Other abnormalities of gait and mobility: Secondary | ICD-10-CM | POA: Diagnosis not present

## 2023-07-07 DIAGNOSIS — R296 Repeated falls: Secondary | ICD-10-CM | POA: Diagnosis not present

## 2023-07-07 DIAGNOSIS — R2681 Unsteadiness on feet: Secondary | ICD-10-CM | POA: Diagnosis not present

## 2023-07-07 DIAGNOSIS — R2689 Other abnormalities of gait and mobility: Secondary | ICD-10-CM | POA: Diagnosis not present

## 2023-07-19 DIAGNOSIS — R296 Repeated falls: Secondary | ICD-10-CM | POA: Diagnosis not present

## 2023-07-19 DIAGNOSIS — R2689 Other abnormalities of gait and mobility: Secondary | ICD-10-CM | POA: Diagnosis not present

## 2023-07-19 DIAGNOSIS — R2681 Unsteadiness on feet: Secondary | ICD-10-CM | POA: Diagnosis not present

## 2023-07-21 DIAGNOSIS — M79675 Pain in left toe(s): Secondary | ICD-10-CM | POA: Diagnosis not present

## 2023-07-21 DIAGNOSIS — M79674 Pain in right toe(s): Secondary | ICD-10-CM | POA: Diagnosis not present

## 2023-07-21 DIAGNOSIS — R2689 Other abnormalities of gait and mobility: Secondary | ICD-10-CM | POA: Diagnosis not present

## 2023-07-21 DIAGNOSIS — B351 Tinea unguium: Secondary | ICD-10-CM | POA: Diagnosis not present

## 2023-07-21 DIAGNOSIS — R296 Repeated falls: Secondary | ICD-10-CM | POA: Diagnosis not present

## 2023-07-21 DIAGNOSIS — R2681 Unsteadiness on feet: Secondary | ICD-10-CM | POA: Diagnosis not present

## 2023-07-26 DIAGNOSIS — R296 Repeated falls: Secondary | ICD-10-CM | POA: Diagnosis not present

## 2023-07-26 DIAGNOSIS — R2689 Other abnormalities of gait and mobility: Secondary | ICD-10-CM | POA: Diagnosis not present

## 2023-07-26 DIAGNOSIS — R2681 Unsteadiness on feet: Secondary | ICD-10-CM | POA: Diagnosis not present

## 2023-07-27 ENCOUNTER — Ambulatory Visit: Payer: Self-pay

## 2023-07-27 ENCOUNTER — Telehealth: Payer: Self-pay | Admitting: Family Medicine

## 2023-07-27 DIAGNOSIS — R0602 Shortness of breath: Secondary | ICD-10-CM

## 2023-07-27 NOTE — Telephone Encounter (Signed)
 Appt scheduled with Dr. Letvak for 08/01/23  Will route to him and PCP

## 2023-07-27 NOTE — Telephone Encounter (Signed)
 Please schedule Thanks for seeing her

## 2023-07-27 NOTE — Telephone Encounter (Signed)
 FYI Only or Action Required?: FYI only for provider.  Patient was last seen in primary care on 06/08/2023 by Randeen Laine LABOR, MD. Called Nurse Triage reporting urinary symptoms. Symptoms began several weeks ago. Interventions attempted: Nothing. Symptoms are: unchanged.  Triage Disposition: See PCP Within 2 Weeks  Patient/caregiver understands and will follow disposition?:   Copied from CRM [id number]. Topic: Clinical - Red Word Triage >> Jul 27, 2023 10:40 AM Chasity T wrote: [facility] that prompted transfer to Nurse Triage: Patient is calling in because she feels as though she is having problems with her kindeys. She states it burns when she urine and says her urine has turned clear which is not normal. Reason for Disposition  All other urine symptoms  Answer Assessment - Initial Assessment Questions 1. SYMPTOM: What's the main symptom you're concerned about? (e.g., frequency, incontinence)     Burning with urination, urine is clear, normally much more yellow, sometimes does not feel like her bladder is emptying 2. ONSET: When did the  symptoms  start?     Several weeks ago 3. PAIN: Is there any pain? If Yes, ask: How bad is it? (Scale: 1-10; mild, moderate, severe)     Denies pain 4. CAUSE: What do you think is causing the symptoms?     unsure 5. OTHER SYMPTOMS: Do you have any other symptoms? (e.g., blood in urine, fever, flank pain, pain with urination)     denies  Protocols used: Urinary Symptoms-A-AH

## 2023-07-27 NOTE — Telephone Encounter (Signed)
 FYI Only or Action Required?: Action required by provider: clinical question for provider.  Patient was last seen in primary care on 06/08/2023 by Randeen Laine LABOR, MD. Called Nurse Triage reporting medication question. Symptoms began x 2 weeks ago. Interventions attempted: Other: Stopped the WIXELA INHUB. Symptoms are: N/A.  Triage Disposition: Call PCP When Office is Open  Patient/caregiver understands and will follow disposition?: No, wishes to speak with PCP  ** Please see below**                 Reason for Disposition  [1] Caller has NON-URGENT medicine question about med that PCP prescribed AND [2] triager unable to answer question  Answer Assessment - Initial Assessment Questions 1. NAME of MEDICINE: What medicine(s) are you calling about?      WIXELA INHUB    2. QUESTION: What is your question? (e.g., double dose of medicine, side effect)       Patient does not wish to take the [facility], she stopped taking it 2 weeks ago. It causes nasal burning and dry mouth. She wants to know if there is an alternative for her to use, or how to proceed.  Protocols used: Medication Question Call-A-AH

## 2023-07-27 NOTE — Telephone Encounter (Signed)
 Left message on VM to see if she wants to mover the appt to tomorrow (7-3) as Dr Jimmy has multiple openings all day.

## 2023-07-27 NOTE — Telephone Encounter (Signed)
 Copied from CRM [id number]. Topic: Clinical - Pink Word Triage >> Jul 27, 2023 11:36 AM Nathanel DEL wrote: Reason for Triage:  pt states this inhaler,  fluticasone -salmeterol (WIXELA INHUB) 250-50 MCG/ACT AEPB Is causing her to have very dry mouth, and burning throat.   She has stopped doing the inhaler. The dry mouth is better.  She would like to know if there is anything OTC she can take, instead of an inhaler?  She says this is the 2nd one she has tried, and not been able to tolerate.     ----------------------------------------------------------------------- From previous Reason for Contact - Medication Question: Reason for CRM:

## 2023-07-28 DIAGNOSIS — R2681 Unsteadiness on feet: Secondary | ICD-10-CM | POA: Diagnosis not present

## 2023-07-28 DIAGNOSIS — R2689 Other abnormalities of gait and mobility: Secondary | ICD-10-CM | POA: Diagnosis not present

## 2023-07-28 DIAGNOSIS — R296 Repeated falls: Secondary | ICD-10-CM | POA: Diagnosis not present

## 2023-07-28 MED ORDER — FLUTICASONE FUROATE-VILANTEROL 200-25 MCG/ACT IN AEPB
1.0000 | INHALATION_SPRAY | Freq: Every day | RESPIRATORY_TRACT | 6 refills | Status: DC
Start: 2023-07-28 — End: 2023-07-28

## 2023-07-28 MED ORDER — FLUTICASONE FUROATE-VILANTEROL 200-25 MCG/ACT IN AEPB: 1.0000 | INHALATION_SPRAY | Freq: Every day | RESPIRATORY_TRACT | 6 refills | Status: DC

## 2023-07-28 NOTE — Addendum Note (Signed)
 Addended by: MALKA DOMINO on: 07/28/2023 11:39 AM   Modules accepted: Orders

## 2023-07-28 NOTE — Addendum Note (Signed)
 Addended by: VICCI SMALL H on: 07/28/2023 12:12 PM   Modules accepted: Orders

## 2023-07-28 NOTE — Telephone Encounter (Signed)
 Patient

## 2023-07-28 NOTE — Telephone Encounter (Signed)
 I have called in the prescription and notified the patient.  Nothing further needed.

## 2023-08-01 ENCOUNTER — Encounter: Payer: Self-pay | Admitting: Internal Medicine

## 2023-08-01 ENCOUNTER — Ambulatory Visit: Admitting: Internal Medicine

## 2023-08-01 VITALS — BP 126/82 | HR 90 | Temp 97.9°F | Ht 63.25 in | Wt 126.0 lb

## 2023-08-01 DIAGNOSIS — R300 Dysuria: Secondary | ICD-10-CM

## 2023-08-01 DIAGNOSIS — N898 Other specified noninflammatory disorders of vagina: Secondary | ICD-10-CM | POA: Insufficient documentation

## 2023-08-01 LAB — POC URINALSYSI DIPSTICK (AUTOMATED)
Bilirubin, UA: NEGATIVE
Blood, UA: 10
Glucose, UA: NEGATIVE
Ketones, UA: NEGATIVE
Leukocytes, UA: NEGATIVE
Nitrite, UA: NEGATIVE
Protein, UA: NEGATIVE
Spec Grav, UA: 1.02 (ref 1.010–1.025)
Urobilinogen, UA: 0.2 U/dL
pH, UA: 5.5 (ref 5.0–8.0)

## 2023-08-01 MED ORDER — MOMETASONE FUROATE 0.1 % EX CREA: TOPICAL_CREAM | Freq: Two times a day (BID) | CUTANEOUS | 1 refills | Status: AC | PRN

## 2023-08-01 NOTE — Progress Notes (Signed)
 Subjective:    Patient ID: Sierra Bentley, female    DOB: 20-Apr-1933, 89 y.o.   MRN: [id number]  HPI Here due to urinary symptoms  Going back a month or more Generally would pee a lot--then got to time she could go as much Noticed some burning in vaginal area Needs pads for some incontinence--especially at night (diaper at night) No burning during voids Some urgency after eating/drinking Increased frequency after vitamins No vaginal discharge No blood   No recent change in pads/diapers  Takes vitamins/minerals for post menopausal women---always turned her urine bright yellow Now the urine is not as yellow  Current Outpatient Medications on File Prior to Visit  Medication Sig Dispense Refill   fluticasone  furoate-vilanterol (BREO ELLIPTA ) 200-25 MCG/ACT AEPB Inhale 1 puff into the lungs daily. 60 each 6   latanoprost (XALATAN) 0.005 % ophthalmic solution      losartan -hydrochlorothiazide  (HYZAAR) 50-12.5 MG tablet TAKE 1/2 TABLET BY MOUTH EVERY EVENING 45 tablet 3   Multiple Vitamin (MULTIVITAMIN) tablet Take 1 tablet by mouth daily. Per pt has Postmenopausal multivitamin packet with 5 pills     NON FORMULARY HD ? Injection for macular in eye     pantoprazole  (PROTONIX ) 20 MG tablet TAKE 1 TABLET BY MOUTH EVERY DAY 90 tablet 3   Polyethyl Glycol-Propyl Glycol (SYSTANE ULTRA OP) Place 1 drop into both eyes 4 (four) times daily.     Probiotic Product (ALIGN EXTRA STRENGTH PO) Take by mouth.     sodium chloride  (MURO 128) 5 % ophthalmic ointment Place 1 application  into both eyes at bedtime.  Uses Muro eye drops during the day as well     TURMERIC PO Take 1,300 mg by mouth in the morning and at bedtime.     No current facility-administered medications on file prior to visit.    Allergies  Allergen Reactions   Ace Inhibitors Cough   Boniva [Ibandronate]     Esophageal distress   Chocolate Other (See Comments)    Severe headache and stopped up sinuses   Fosamax  [Alendronate]     Esophageal distress   Oxycodone Other (See Comments)    Dizziness resulting in fall     Past Medical History:  Diagnosis Date   Allergic rhinitis, cause unspecified    Anemia    Breast cancer (HCC)    left breast IDC 2023   Chronically dry eyes, bilateral    Diverticulosis of colon    Endometrial polyp    Family history of prostate cancer 03/18/2021   Fuchs' corneal dystrophy of right eye    GERD (gastroesophageal reflux disease)    Hearing loss of both ears    pt stated has hearing aids but does not wear   History of epistaxis    per pt has had several cautization's for nose bleed   History of radiation therapy    05/20/21-06/10/21- Dr. Lynwood Nasuti   HTN (hypertension)    Hyperlipidemia    Macular degeneration of both eyes    followed @[facility]  by dr shaunna. mettu;   both age-related and right is with active choroidal neovascularization   OA (osteoarthritis)    OAB (overactive bladder)    Osteopenia    Thickened endometrium    Urge incontinence of urine    Wears glasses     Past Surgical History:  Procedure Laterality Date   BREAST BIOPSY Left 2011   fibrocystic change   BREAST LUMPECTOMY WITH RADIOACTIVE SEED LOCALIZATION Left 04/13/2021   Procedure:  LEFT BREAST LUMPECTOMY WITH RADIOACTIVE SEED LOCALIZATION;  Surgeon: Ebbie Cough, MD;  Location:  S[facility]ERY [facility];  Service: General;  Laterality: Left;   CATARACT EXTRACTION W/ INTRAOCULAR LENS IMPLANT Bilateral 2008   COLONOSCOPY  2013   DILATATION & CURETTAGE/HYSTEROSCOPY WITH MYOSURE N/A 11/26/2020   Procedure: DILATATION & CURETTAGE/HYSTEROSCOPY WITH MYOSURE;  Surgeon: Lavoie, Marie-Lyne, MD;  Location: [facility];  Service: Gynecology;  Laterality: N/A;   TONSILLECTOMY  1962    Family History  Problem Relation Age of Onset   Other Mother        Arrythmia   Coronary artery disease Mother    Hypertension Mother    Anxiety disorder Mother    Heart attack Father 11    Coronary artery disease Father    Prostate cancer Father        dx unknown age   Throat cancer Brother        d. 67   Cancer Other        two maternal female cousins; unknown cancer; dx after 15   Depression Other        family history    Social History   Socioeconomic History   Marital status: Widowed    Spouse name: Not on file   Number of children: Not on file   Years of education: Not on file   Highest education level: Not on file  Occupational History   Not on file  Tobacco Use   Smoking status: Never    Passive exposure: Never   Smokeless tobacco: Never  Vaping Use   Vaping status: Never Used  Substance and Sexual Activity   Alcohol use: No    Alcohol/week: 0.0 standard drinks of alcohol   Drug use: Never   Sexual activity: Not Currently    Partners: Male    Birth control/protection: Post-menopausal  Other Topics Concern   Not on file  Social History Narrative   Married      No biological children (Has step children)      Taught HS x 30 years      Regular exercise         Social Drivers of Health   Financial Resource Strain: Low Risk  (12/27/2022)   Overall Financial Resource Strain (CARDIA)    Difficulty of Paying Living Expenses: Not hard at all  Food Insecurity: No Food Insecurity (12/27/2022)   Hunger Vital Sign    Worried About Running Out of Food in the Last Year: Never true    Ran Out of Food in the Last Year: Never true  Transportation Needs: No Transportation Needs (12/27/2022)   PRAPARE - Administrator, Civil Service (Medical): No    Lack of Transportation (Non-Medical): No  Physical Activity: Insufficiently Active (12/27/2022)   Exercise Vital Sign    Days of Exercise per Week: 4 days    Minutes of Exercise per Session: 30 min  Stress: No Stress Concern Present (12/27/2022)   [employer] of Occupational Health - Occupational Stress Questionnaire    Feeling of Stress : Only a little  Social Connections: Moderately  Isolated (12/27/2022)   Social Connection and Isolation Panel    Frequency of Communication with Friends and Family: Three times a week    Frequency of Social Gatherings with Friends and Family: Three times a week    Attends Religious Services: More than 4 times per year    Active Member of Clubs or Organizations: No    Attends Club or  Organization Meetings: Never    Marital Status: Widowed  Intimate Partner Violence: Not At Risk (12/27/2022)   Humiliation, Afraid, Rape, and Kick questionnaire    Fear of Current or Ex-Partner: No    Emotionally Abused: No    Physically Abused: No    Sexually Abused: No   Review of Systems No fever--but feels hot at times. Cold other times Occasional nausea. No vomiting Eating her usual Known granuloma annulare--doesn't know if there is any rash    Objective:   Physical Exam Constitutional:      Appearance: Normal appearance.  Abdominal:     Palpations: Abdomen is soft.     Tenderness: There is no abdominal tenderness. There is no right CVA tenderness or left CVA tenderness.  Neurological:     Mental Status: She is alert.            Assessment & Plan:

## 2023-08-01 NOTE — Assessment & Plan Note (Signed)
 Has vaginal burning Urinalysis is negative Sounds like vaginal irritation---symptoms not typical for yeast (no discharge) May be from the pads/diapers  Will try mometasone  cream If ongoing symptoms, will go to gyn for exam

## 2023-08-02 DIAGNOSIS — R296 Repeated falls: Secondary | ICD-10-CM | POA: Diagnosis not present

## 2023-08-02 DIAGNOSIS — R2689 Other abnormalities of gait and mobility: Secondary | ICD-10-CM | POA: Diagnosis not present

## 2023-08-02 DIAGNOSIS — R2681 Unsteadiness on feet: Secondary | ICD-10-CM | POA: Diagnosis not present

## 2023-08-04 DIAGNOSIS — R296 Repeated falls: Secondary | ICD-10-CM | POA: Diagnosis not present

## 2023-08-04 DIAGNOSIS — R2681 Unsteadiness on feet: Secondary | ICD-10-CM | POA: Diagnosis not present

## 2023-08-04 DIAGNOSIS — R2689 Other abnormalities of gait and mobility: Secondary | ICD-10-CM | POA: Diagnosis not present

## 2023-08-09 DIAGNOSIS — R296 Repeated falls: Secondary | ICD-10-CM | POA: Diagnosis not present

## 2023-08-09 DIAGNOSIS — R2689 Other abnormalities of gait and mobility: Secondary | ICD-10-CM | POA: Diagnosis not present

## 2023-08-09 DIAGNOSIS — R2681 Unsteadiness on feet: Secondary | ICD-10-CM | POA: Diagnosis not present

## 2023-08-11 DIAGNOSIS — R2681 Unsteadiness on feet: Secondary | ICD-10-CM | POA: Diagnosis not present

## 2023-08-11 DIAGNOSIS — R2689 Other abnormalities of gait and mobility: Secondary | ICD-10-CM | POA: Diagnosis not present

## 2023-08-11 DIAGNOSIS — R296 Repeated falls: Secondary | ICD-10-CM | POA: Diagnosis not present

## 2023-08-15 ENCOUNTER — Encounter: Payer: Self-pay | Admitting: Family Medicine

## 2023-08-15 ENCOUNTER — Ambulatory Visit: Admitting: Family Medicine

## 2023-08-15 VITALS — BP 106/56 | HR 85 | Temp 97.8°F | Ht 63.25 in | Wt 127.0 lb

## 2023-08-15 DIAGNOSIS — Z9181 History of falling: Secondary | ICD-10-CM | POA: Diagnosis not present

## 2023-08-15 DIAGNOSIS — R519 Headache, unspecified: Secondary | ICD-10-CM | POA: Diagnosis not present

## 2023-08-15 DIAGNOSIS — R202 Paresthesia of skin: Secondary | ICD-10-CM

## 2023-08-15 DIAGNOSIS — R2689 Other abnormalities of gait and mobility: Secondary | ICD-10-CM | POA: Diagnosis not present

## 2023-08-15 NOTE — Assessment & Plan Note (Signed)
 Doing PT for strength and balance currently  Numb feeling feet is worsening this

## 2023-08-15 NOTE — Assessment & Plan Note (Signed)
 Comes and goes Better now  Worsened by breo inhaler

## 2023-08-15 NOTE — Patient Instructions (Addendum)
 I put the referral in for neurology for the paresthesia of feet and also balance problems  Please let us  know if you don't hear in 1-2 weeks to set that up  Please let us  know if your headaches return or worsen  Stay hydrated  Stay out of the heat if needed   Keep taking care of yourself  Keep doing your physical therapy

## 2023-08-15 NOTE — Progress Notes (Signed)
 Subjective:    Patient ID: Sierra Bentley, female    DOB: October 02, 1933, 89 y.o.   MRN: [id number]  HPI  Wt Readings from Last 3 Encounters:  08/15/23 127 lb (57.6 kg)  08/01/23 126 lb (57.2 kg)  06/08/23 125 lb (56.7 kg)   22.32 kg/m  Vitals:   08/15/23 1529  BP: (!) 106/56  Pulse: 85  Temp: 97.8 F (36.6 C)  SpO2: 94%    Pt presents with c/o  Headache ? Consider neuro referral (LE paresthesia)  Thin skin   Thinks she has neuropathy  Feet -sometimes they feel numb or like they are covered with something  Cramps occational in toes (esp on right)  No burning or stinging    Thin skin Veins pop out easily , bruises with little trauma  Every little thing bruises her   Headaches Start in her nose - when nose stops up on one side (congestion)-pain radiates to above eye then head and then back of ear  (sinus headache)  For years  Worsened when she used an inhaler (breo) and her specialist is sending in a different one  Had dry mouth at night also  Also dry mouth -using some over the counter products   Better now off the breo     Had one fall when getting out of bed - went back wards / no injuries at all  Does PT for balance and fall prevention     MRI of brain from ENT 12/2020  IMPRESSION: 1. No acute intracranial abnormality. Mild white matter changes most likely due to chronic microvascular ischemia. 2. No cause for hearing loss.  Negative for vestibular schwannoma   Lab Results  Component Value Date   WBC 4.7 04/19/2023   HGB 12.9 04/19/2023   HCT 39.2 04/19/2023   MCV 85.4 04/19/2023   PLT 210.0 04/19/2023   Lab Results  Component Value Date   ESRSEDRATE 10 05/27/2009   Lab Results  Component Value Date   NA 139 04/19/2023   K 4.3 04/19/2023   CO2 33 (H) 04/19/2023   GLUCOSE 98 04/19/2023   BUN 18 04/19/2023   CREATININE 0.74 04/19/2023   CALCIUM 9.8 04/19/2023   GFR 72.79 04/19/2023   GFRNONAA >60 04/03/2021   Lab Results   Component Value Date   ALT 13 04/19/2023   AST 16 04/19/2023   ALKPHOS 48 04/19/2023   BILITOT 0.6 04/19/2023   Lab Results  Component Value Date   TSH 1.76 04/19/2023   Lab Results  Component Value Date   VITAMINB12 994 (H) 04/19/2023   Last vitamin D  Lab Results  Component Value Date   VD25OH 26.04 (L) 04/19/2023   Taking vitamin D3 now   Lab Results  Component Value Date   HGBA1C 5.6 04/19/2023   HGBA1C 5.6 04/13/2022   HGBA1C 5.4 03/19/2021      Does have a dermatology auto immune condition  On arms Pt does not remember her dx name  Was given  2 medicines -neither was tolerated (one pill and one topical)     Patient Active Problem List   Diagnosis Date Noted   Sinus headache 08/15/2023   Vaginal itching 08/01/2023   Granuloma annulare 06/08/2023   Fatigue 09/15/2022   History of fall 05/20/2022   Sacral pain 05/20/2022   SOB (shortness of breath) on exertion 05/20/2022   Grief reaction 06/14/2021   Current use of proton pump inhibitor 03/19/2021   Family history of prostate cancer 03/18/2021  Malignant neoplasm of upper-outer quadrant of left breast in female, estrogen receptor negative (HCC) 03/17/2021   Poor balance 05/07/2019   Exudative age-related macular degeneration, right eye, with active choroidal neovascularization (HCC) 09/04/2018   History of hip fracture 02/27/2018   Paresthesia of both feet 10/07/2017   Pedal edema 09/28/2017   Prediabetes 11/30/2016   At moderate risk for fall 09/05/2015   Cervical spondylosis without myelopathy 08/05/2015   Routine general medical examination at a health care facility 08/27/2014   Colon cancer screening 08/27/2014   Encounter for Medicare annual wellness exam 07/25/2013   Varicosities of leg 05/23/2012   Dry eye syndrome 04/14/2012   Nonexudative senile macular degeneration of retina 04/14/2012   Fuchs' corneal dystrophy 04/14/2012   Pseudophakia 04/14/2012   Abnormal ultrasound of thyroid  gland  03/01/2011   Leg cramps 05/27/2009   Hyperlipidemia 06/11/2008   Allergic rhinitis 03/29/2008   Essential hypertension, benign 12/26/2006   GERD 12/26/2006   Rheumatoid arthritis (HCC) 12/26/2006   Osteoporosis 12/26/2006   URINARY INCONTINENCE 12/26/2006   Past Medical History:  Diagnosis Date   Allergic rhinitis, cause unspecified    Anemia    Breast cancer (HCC)    left breast IDC 2023   Chronically dry eyes, bilateral    Diverticulosis of colon    Endometrial polyp    Family history of prostate cancer 03/18/2021   Fuchs' corneal dystrophy of right eye    GERD (gastroesophageal reflux disease)    Hearing loss of both ears    pt stated has hearing aids but does not wear   History of epistaxis    per pt has had several cautization's for nose bleed   History of radiation therapy    05/20/21-06/10/21- Dr. Lynwood Nasuti   HTN (hypertension)    Hyperlipidemia    Macular degeneration of both eyes    followed @[facility]  by dr shaunna. mettu;   both age-related and right is with active choroidal neovascularization   OA (osteoarthritis)    OAB (overactive bladder)    Osteopenia    Thickened endometrium    Urge incontinence of urine    Wears glasses    Past Surgical History:  Procedure Laterality Date   BREAST BIOPSY Left 2011   fibrocystic change   BREAST LUMPECTOMY WITH RADIOACTIVE SEED LOCALIZATION Left 04/13/2021   Procedure: LEFT BREAST LUMPECTOMY WITH RADIOACTIVE SEED LOCALIZATION;  Surgeon: Ebbie Cough, MD;  Location:  SUR[facility]ERY CENTER;  Service: General;  Laterality: Left;   CATARACT EXTRACTION W/ INTRAOCULAR LENS IMPLANT Bilateral 2008   COLONOSCOPY  2013   DILATATION & CURETTAGE/HYSTEROSCOPY WITH MYOSURE N/A 11/26/2020   Procedure: DILATATION & CURETTAGE/HYSTEROSCOPY WITH MYOSURE;  Surgeon: Lavoie, Marie-Lyne, MD;  Location: [facility];  Service: Gynecology;  Laterality: N/A;   TONSILLECTOMY  1962   Social History   Tobacco Use   Smoking  status: Never    Passive exposure: Never   Smokeless tobacco: Never  Vaping Use   Vaping status: Never Used  Substance Use Topics   Alcohol use: No    Alcohol/week: 0.0 standard drinks of alcohol   Drug use: Never   Family History  Problem Relation Age of Onset   Other Mother        Arrythmia   Coronary artery disease Mother    Hypertension Mother    Anxiety disorder Mother    Heart attack Father 62   Coronary artery disease Father    Prostate cancer Father  dx unknown age   Throat cancer Brother        d. 8   Cancer Other        two maternal female cousins; unknown cancer; dx after 24   Depression Other        family history   Allergies  Allergen Reactions   Ace Inhibitors Cough   Boniva [Ibandronate]     Esophageal distress   Chocolate Other (See Comments)    Severe headache and stopped up sinuses   Fosamax [Alendronate]     Esophageal distress   Oxycodone Other (See Comments)    Dizziness resulting in fall    Current Outpatient Medications on File Prior to Visit  Medication Sig Dispense Refill   latanoprost (XALATAN) 0.005 % ophthalmic solution      losartan -hydrochlorothiazide  (HYZAAR) 50-12.5 MG tablet TAKE 1/2 TABLET BY MOUTH EVERY EVENING 45 tablet 3   mometasone  (ELOCON ) 0.1 % cream Apply topically 2 (two) times daily as needed. 15 g 1   Multiple Vitamin (MULTIVITAMIN) tablet Take 1 tablet by mouth daily. Per pt has Postmenopausal multivitamin packet with 5 pills     NON FORMULARY HD ? Injection for macular in eye     pantoprazole  (PROTONIX ) 20 MG tablet TAKE 1 TABLET BY MOUTH EVERY DAY 90 tablet 3   Polyethyl Glycol-Propyl Glycol (SYSTANE ULTRA OP) Place 1 drop into both eyes 4 (four) times daily.     Probiotic Product (ALIGN EXTRA STRENGTH PO) Take by mouth.     sodium chloride  (MURO 128) 5 % ophthalmic ointment Place 1 application  into both eyes at bedtime.  Uses Muro eye drops during the day as well     TURMERIC PO Take 1,300 mg by mouth in the  morning and at bedtime.     fluticasone  furoate-vilanterol (BREO ELLIPTA ) 200-25 MCG/ACT AEPB Inhale 1 puff into the lungs daily. (Patient not taking: Reported on 08/15/2023) 60 each 6   No current facility-administered medications on file prior to visit.    Review of Systems  Constitutional:  Negative for activity change, appetite change, fatigue, fever and unexpected weight change.  HENT:  Negative for congestion, ear pain, rhinorrhea, sinus pressure and sore throat.        Occational congestion None today  Eyes:  Negative for pain, redness and visual disturbance.  Respiratory:  Negative for cough, shortness of breath and wheezing.   Cardiovascular:  Negative for chest pain and palpitations.  Gastrointestinal:  Negative for abdominal pain, blood in stool, constipation and diarrhea.  Endocrine: Negative for polydipsia and polyuria.  Genitourinary:  Negative for dysuria, frequency and urgency.  Musculoskeletal:  Negative for arthralgias, back pain and myalgias.  Skin:  Negative for pallor and rash.  Allergic/Immunologic: Negative for environmental allergies.  Neurological:  Positive for numbness and headaches. Negative for dizziness, tremors, seizures, syncope, facial asymmetry, speech difficulty, weakness and light-headedness.       No headache today   Poor balance   Hematological:  Negative for adenopathy. Does not bruise/bleed easily.  Psychiatric/Behavioral:  Negative for decreased concentration and dysphoric mood. The patient is not nervous/anxious.        Objective:   Physical Exam Constitutional:      General: She is not in acute distress.    Appearance: She is well-developed and normal weight. She is not ill-appearing or diaphoretic.  HENT:     Head: Normocephalic and atraumatic.     Nose: No congestion.     Mouth/Throat:     Mouth:  Mucous membranes are moist.  Eyes:     Conjunctiva/sclera: Conjunctivae normal.     Pupils: Pupils are equal, round, and reactive to  light.  Neck:     Thyroid : No thyromegaly.     Vascular: No carotid bruit or JVD.  Cardiovascular:     Rate and Rhythm: Normal rate and regular rhythm.     Pulses: Normal pulses.     Heart sounds: Normal heart sounds.     No gallop.     Comments: Varicosities in feet and ankles  Pulmonary:     Effort: Pulmonary effort is normal. No respiratory distress.     Breath sounds: Normal breath sounds. No wheezing or rales.  Abdominal:     General: There is no distension or abdominal bruit.     Palpations: Abdomen is soft.  Musculoskeletal:     Cervical back: Normal range of motion and neck supple.     Right lower leg: No edema.     Left lower leg: No edema.  Lymphadenopathy:     Cervical: No cervical adenopathy.  Skin:    General: Skin is warm and dry.     Coloration: Skin is not pale.     Findings: No rash.     Comments: Thin skin with few senile purpura on bilateral arms   Neurological:     Mental Status: She is alert.     Cranial Nerves: Cranial nerves 2-12 are intact. No cranial nerve deficit.     Sensory: Sensation is intact.     Motor: Motor function is intact. No weakness.     Coordination: Coordination normal.     Deep Tendon Reflexes: Reflexes are normal and symmetric. Reflexes normal.     Comments: Slow wide based gait   Normal sensation to light touch and temp in feet   Psychiatric:        Mood and Affect: Mood normal.           Assessment & Plan:   Problem List Items Addressed This Visit       Other   Sinus headache   Comes and goes Better now  Worsened by breo inhaler        Poor balance   Doing PT for strength and balance currently  Numb feeling feet is worsening this      Relevant Orders   Ambulatory referral to Neurology   Paresthesia of both feet - Primary   Ongoing  Worse with time Feeling of numbness or blunted sensation  Waxes and wanes Interferes with balance because she cannot tell where feet are Reassuring exam today  Interested  in neurology referral  In past B12 was normal to high, D low (now taking), and other labs in normal range  Neuro referral done for paresthesia (possible neuropathy-given handout as well) , and balance problem Will continue PT        Relevant Orders   Ambulatory referral to Neurology   History of fall   One fall since last visit No injuries Continues PT for strength and balance

## 2023-08-15 NOTE — Assessment & Plan Note (Signed)
 Ongoing  Worse with time Feeling of numbness or blunted sensation  Waxes and wanes Interferes with balance because she cannot tell where feet are Reassuring exam today  Interested in neurology referral  In past B12 was normal to high, D low (now taking), and other labs in normal range  Neuro referral done for paresthesia (possible neuropathy-given handout as well) , and balance problem Will continue PT

## 2023-08-15 NOTE — Assessment & Plan Note (Signed)
 One fall since last visit No injuries Continues PT for strength and balance

## 2023-08-16 DIAGNOSIS — R296 Repeated falls: Secondary | ICD-10-CM | POA: Diagnosis not present

## 2023-08-16 DIAGNOSIS — R2689 Other abnormalities of gait and mobility: Secondary | ICD-10-CM | POA: Diagnosis not present

## 2023-08-16 DIAGNOSIS — R2681 Unsteadiness on feet: Secondary | ICD-10-CM | POA: Diagnosis not present

## 2023-08-17 ENCOUNTER — Encounter: Payer: Self-pay | Admitting: Radiology

## 2023-08-17 ENCOUNTER — Ambulatory Visit: Admitting: Radiology

## 2023-08-17 VITALS — BP 110/62 | HR 83 | Temp 98.5°F | Wt 127.4 lb

## 2023-08-17 DIAGNOSIS — R300 Dysuria: Secondary | ICD-10-CM

## 2023-08-17 DIAGNOSIS — L920 Granuloma annulare: Secondary | ICD-10-CM

## 2023-08-17 LAB — URINALYSIS, COMPLETE W/RFL CULTURE
Bacteria, UA: NONE SEEN /HPF
Bilirubin Urine: NEGATIVE
Glucose, UA: NEGATIVE
Hgb urine dipstick: NEGATIVE
Hyaline Cast: NONE SEEN /LPF
Ketones, ur: NEGATIVE
Leukocyte Esterase: NEGATIVE
Nitrites, Initial: NEGATIVE
Protein, ur: NEGATIVE
RBC / HPF: NONE SEEN /HPF (ref 0–2)
Specific Gravity, Urine: 1.015 (ref 1.001–1.035)
WBC, UA: NONE SEEN /HPF (ref 0–5)
pH: 7 (ref 5.0–8.0)

## 2023-08-17 LAB — NO CULTURE INDICATED

## 2023-08-17 MED ORDER — CLOBETASOL PROPIONATE 0.05 % EX OINT
1.0000 | TOPICAL_OINTMENT | Freq: Two times a day (BID) | CUTANEOUS | 0 refills | Status: AC
Start: 2023-08-17 — End: ?

## 2023-08-17 NOTE — Progress Notes (Signed)
      Subjective: Sierra Bentley is a 89 y.o. female who complains of rash on legs and groin area, intermittent vaginal itching x's 1 year (none currently), also with intermittent dysuria (had negative urinalysis @ PCP 08/01/23). Previously diagnosed with Granuloma annulare, no improvement with mometasone .   Review of Systems  All other systems reviewed and are negative.   Past Medical History:  Diagnosis Date   Allergic rhinitis, cause unspecified    Anemia    Breast cancer (HCC)    left breast IDC 2023   Chronically dry eyes, bilateral    Diverticulosis of colon    Endometrial polyp    Family history of prostate cancer 03/18/2021   Fuchs' corneal dystrophy of right eye    GERD (gastroesophageal reflux disease)    Hearing loss of both ears    pt stated has hearing aids but does not wear   History of epistaxis    per pt has had several cautization's for nose bleed   History of radiation therapy    05/20/21-06/10/21- Dr. Lynwood Nasuti   HTN (hypertension)    Hyperlipidemia    Macular degeneration of both eyes    followed @[facility]  by dr shaunna. mettu;   both age-related and right is with active choroidal neovascularization   OA (osteoarthritis)    OAB (overactive bladder)    Osteopenia    Thickened endometrium    Urge incontinence of urine    Wears glasses       Objective:  Today's Vitals   08/17/23 1521  BP: 110/62  Pulse: 83  Temp: 98.5 F (36.9 C)  TempSrc: Oral  SpO2: 95%  Weight: 127 lb 6.4 oz (57.8 kg)   Body mass index is 22.39 kg/m.   Physical Exam Vitals and nursing note reviewed. Exam conducted with a chaperone present.  Constitutional:      Appearance: Normal appearance. She is well-developed.  Pulmonary:     Effort: Pulmonary effort is normal.  Abdominal:     General: Abdomen is flat.     Palpations: Abdomen is soft.  Genitourinary:    General: Normal vulva.     Vagina: No vaginal discharge, erythema (atrophic), bleeding or lesions.       Comments: Multiple annular, smooth, discoloured plaques of varying sizes from groin to knees. No plaques on the vulva or mons Neurological:     Mental Status: She is alert.  Psychiatric:        Mood and Affect: Mood normal.        Thought Content: Thought content normal.        Judgment: Judgment normal.      Urine dipstick shows negative for all components.  Micro exam: negative for WBC's or RBC's.   Darice Hoit, CMA present for exam  Assessment:/Plan:   1. Dysuria (Primary) Reassure negative urinalysis - Urinalysis,Complete w/RFL Culture  2. Granuloma annulare - clobetasol  ointment (TEMOVATE ) 0.05 %; Apply 1 Application topically 2 (two) times daily.  Dispense: 30 g; Refill: 0 If no improvement in 14days follow up with derm.    Darbie Biancardi B, NP 3:41 PM

## 2023-08-18 DIAGNOSIS — R296 Repeated falls: Secondary | ICD-10-CM | POA: Diagnosis not present

## 2023-08-18 DIAGNOSIS — R2681 Unsteadiness on feet: Secondary | ICD-10-CM | POA: Diagnosis not present

## 2023-08-18 DIAGNOSIS — R2689 Other abnormalities of gait and mobility: Secondary | ICD-10-CM | POA: Diagnosis not present

## 2023-08-23 DIAGNOSIS — R296 Repeated falls: Secondary | ICD-10-CM | POA: Diagnosis not present

## 2023-08-23 DIAGNOSIS — R2681 Unsteadiness on feet: Secondary | ICD-10-CM | POA: Diagnosis not present

## 2023-08-23 DIAGNOSIS — R2689 Other abnormalities of gait and mobility: Secondary | ICD-10-CM | POA: Diagnosis not present

## 2023-08-25 DIAGNOSIS — R2681 Unsteadiness on feet: Secondary | ICD-10-CM | POA: Diagnosis not present

## 2023-08-25 DIAGNOSIS — R2689 Other abnormalities of gait and mobility: Secondary | ICD-10-CM | POA: Diagnosis not present

## 2023-08-25 DIAGNOSIS — R296 Repeated falls: Secondary | ICD-10-CM | POA: Diagnosis not present

## 2023-08-29 DIAGNOSIS — H353231 Exudative age-related macular degeneration, bilateral, with active choroidal neovascularization: Secondary | ICD-10-CM | POA: Diagnosis not present

## 2023-08-30 DIAGNOSIS — R296 Repeated falls: Secondary | ICD-10-CM | POA: Diagnosis not present

## 2023-08-30 DIAGNOSIS — R2689 Other abnormalities of gait and mobility: Secondary | ICD-10-CM | POA: Diagnosis not present

## 2023-08-30 DIAGNOSIS — R2681 Unsteadiness on feet: Secondary | ICD-10-CM | POA: Diagnosis not present

## 2023-09-01 DIAGNOSIS — R296 Repeated falls: Secondary | ICD-10-CM | POA: Diagnosis not present

## 2023-09-01 DIAGNOSIS — R2689 Other abnormalities of gait and mobility: Secondary | ICD-10-CM | POA: Diagnosis not present

## 2023-09-01 DIAGNOSIS — R2681 Unsteadiness on feet: Secondary | ICD-10-CM | POA: Diagnosis not present

## 2023-09-06 DIAGNOSIS — R2689 Other abnormalities of gait and mobility: Secondary | ICD-10-CM | POA: Diagnosis not present

## 2023-09-06 DIAGNOSIS — R2681 Unsteadiness on feet: Secondary | ICD-10-CM | POA: Diagnosis not present

## 2023-09-06 DIAGNOSIS — R296 Repeated falls: Secondary | ICD-10-CM | POA: Diagnosis not present

## 2023-09-08 DIAGNOSIS — R2689 Other abnormalities of gait and mobility: Secondary | ICD-10-CM | POA: Diagnosis not present

## 2023-09-08 DIAGNOSIS — R296 Repeated falls: Secondary | ICD-10-CM | POA: Diagnosis not present

## 2023-09-08 DIAGNOSIS — R2681 Unsteadiness on feet: Secondary | ICD-10-CM | POA: Diagnosis not present

## 2023-09-13 DIAGNOSIS — R2689 Other abnormalities of gait and mobility: Secondary | ICD-10-CM | POA: Diagnosis not present

## 2023-09-13 DIAGNOSIS — R2681 Unsteadiness on feet: Secondary | ICD-10-CM | POA: Diagnosis not present

## 2023-09-13 DIAGNOSIS — R296 Repeated falls: Secondary | ICD-10-CM | POA: Diagnosis not present

## 2023-09-15 DIAGNOSIS — R2689 Other abnormalities of gait and mobility: Secondary | ICD-10-CM | POA: Diagnosis not present

## 2023-09-15 DIAGNOSIS — R296 Repeated falls: Secondary | ICD-10-CM | POA: Diagnosis not present

## 2023-09-15 DIAGNOSIS — R2681 Unsteadiness on feet: Secondary | ICD-10-CM | POA: Diagnosis not present

## 2023-09-20 DIAGNOSIS — R2689 Other abnormalities of gait and mobility: Secondary | ICD-10-CM | POA: Diagnosis not present

## 2023-09-20 DIAGNOSIS — R296 Repeated falls: Secondary | ICD-10-CM | POA: Diagnosis not present

## 2023-09-20 DIAGNOSIS — R2681 Unsteadiness on feet: Secondary | ICD-10-CM | POA: Diagnosis not present

## 2023-09-22 DIAGNOSIS — R2689 Other abnormalities of gait and mobility: Secondary | ICD-10-CM | POA: Diagnosis not present

## 2023-09-22 DIAGNOSIS — R296 Repeated falls: Secondary | ICD-10-CM | POA: Diagnosis not present

## 2023-09-22 DIAGNOSIS — R2681 Unsteadiness on feet: Secondary | ICD-10-CM | POA: Diagnosis not present

## 2023-09-27 DIAGNOSIS — R296 Repeated falls: Secondary | ICD-10-CM | POA: Diagnosis not present

## 2023-09-27 DIAGNOSIS — R2689 Other abnormalities of gait and mobility: Secondary | ICD-10-CM | POA: Diagnosis not present

## 2023-09-27 DIAGNOSIS — R2681 Unsteadiness on feet: Secondary | ICD-10-CM | POA: Diagnosis not present

## 2023-09-29 DIAGNOSIS — R296 Repeated falls: Secondary | ICD-10-CM | POA: Diagnosis not present

## 2023-09-29 DIAGNOSIS — R2681 Unsteadiness on feet: Secondary | ICD-10-CM | POA: Diagnosis not present

## 2023-09-29 DIAGNOSIS — R2689 Other abnormalities of gait and mobility: Secondary | ICD-10-CM | POA: Diagnosis not present

## 2023-10-04 DIAGNOSIS — R2689 Other abnormalities of gait and mobility: Secondary | ICD-10-CM | POA: Diagnosis not present

## 2023-10-04 DIAGNOSIS — R2681 Unsteadiness on feet: Secondary | ICD-10-CM | POA: Diagnosis not present

## 2023-10-04 DIAGNOSIS — R296 Repeated falls: Secondary | ICD-10-CM | POA: Diagnosis not present

## 2023-10-05 DIAGNOSIS — Z7689 Persons encountering health services in other specified circumstances: Secondary | ICD-10-CM | POA: Diagnosis not present

## 2023-10-05 DIAGNOSIS — M79672 Pain in left foot: Secondary | ICD-10-CM | POA: Diagnosis not present

## 2023-10-05 DIAGNOSIS — R202 Paresthesia of skin: Secondary | ICD-10-CM | POA: Diagnosis not present

## 2023-10-05 DIAGNOSIS — R2689 Other abnormalities of gait and mobility: Secondary | ICD-10-CM | POA: Diagnosis not present

## 2023-10-06 DIAGNOSIS — R296 Repeated falls: Secondary | ICD-10-CM | POA: Diagnosis not present

## 2023-10-06 DIAGNOSIS — R2689 Other abnormalities of gait and mobility: Secondary | ICD-10-CM | POA: Diagnosis not present

## 2023-10-06 DIAGNOSIS — R2681 Unsteadiness on feet: Secondary | ICD-10-CM | POA: Diagnosis not present

## 2023-10-11 DIAGNOSIS — R2689 Other abnormalities of gait and mobility: Secondary | ICD-10-CM | POA: Diagnosis not present

## 2023-10-11 DIAGNOSIS — R2681 Unsteadiness on feet: Secondary | ICD-10-CM | POA: Diagnosis not present

## 2023-10-11 DIAGNOSIS — R296 Repeated falls: Secondary | ICD-10-CM | POA: Diagnosis not present

## 2023-10-13 DIAGNOSIS — R296 Repeated falls: Secondary | ICD-10-CM | POA: Diagnosis not present

## 2023-10-13 DIAGNOSIS — R2689 Other abnormalities of gait and mobility: Secondary | ICD-10-CM | POA: Diagnosis not present

## 2023-10-13 DIAGNOSIS — R2681 Unsteadiness on feet: Secondary | ICD-10-CM | POA: Diagnosis not present

## 2023-10-14 DIAGNOSIS — D225 Melanocytic nevi of trunk: Secondary | ICD-10-CM | POA: Diagnosis not present

## 2023-10-14 DIAGNOSIS — L821 Other seborrheic keratosis: Secondary | ICD-10-CM | POA: Diagnosis not present

## 2023-10-14 DIAGNOSIS — D2262 Melanocytic nevi of left upper limb, including shoulder: Secondary | ICD-10-CM | POA: Diagnosis not present

## 2023-10-14 DIAGNOSIS — L920 Granuloma annulare: Secondary | ICD-10-CM | POA: Diagnosis not present

## 2023-10-14 DIAGNOSIS — D2271 Melanocytic nevi of right lower limb, including hip: Secondary | ICD-10-CM | POA: Diagnosis not present

## 2023-10-14 DIAGNOSIS — D2272 Melanocytic nevi of left lower limb, including hip: Secondary | ICD-10-CM | POA: Diagnosis not present

## 2023-10-14 DIAGNOSIS — D2261 Melanocytic nevi of right upper limb, including shoulder: Secondary | ICD-10-CM | POA: Diagnosis not present

## 2023-10-14 DIAGNOSIS — L218 Other seborrheic dermatitis: Secondary | ICD-10-CM | POA: Diagnosis not present

## 2023-10-18 DIAGNOSIS — R2681 Unsteadiness on feet: Secondary | ICD-10-CM | POA: Diagnosis not present

## 2023-10-18 DIAGNOSIS — R2689 Other abnormalities of gait and mobility: Secondary | ICD-10-CM | POA: Diagnosis not present

## 2023-10-18 DIAGNOSIS — R296 Repeated falls: Secondary | ICD-10-CM | POA: Diagnosis not present

## 2023-10-20 DIAGNOSIS — R296 Repeated falls: Secondary | ICD-10-CM | POA: Diagnosis not present

## 2023-10-20 DIAGNOSIS — R2681 Unsteadiness on feet: Secondary | ICD-10-CM | POA: Diagnosis not present

## 2023-10-20 DIAGNOSIS — R2689 Other abnormalities of gait and mobility: Secondary | ICD-10-CM | POA: Diagnosis not present

## 2023-10-25 DIAGNOSIS — R2681 Unsteadiness on feet: Secondary | ICD-10-CM | POA: Diagnosis not present

## 2023-10-25 DIAGNOSIS — R296 Repeated falls: Secondary | ICD-10-CM | POA: Diagnosis not present

## 2023-10-25 DIAGNOSIS — R2689 Other abnormalities of gait and mobility: Secondary | ICD-10-CM | POA: Diagnosis not present

## 2023-10-27 DIAGNOSIS — R2681 Unsteadiness on feet: Secondary | ICD-10-CM | POA: Diagnosis not present

## 2023-10-27 DIAGNOSIS — R2689 Other abnormalities of gait and mobility: Secondary | ICD-10-CM | POA: Diagnosis not present

## 2023-10-27 DIAGNOSIS — R296 Repeated falls: Secondary | ICD-10-CM | POA: Diagnosis not present

## 2023-10-31 DIAGNOSIS — H353123 Nonexudative age-related macular degeneration, left eye, advanced atrophic without subfoveal involvement: Secondary | ICD-10-CM | POA: Diagnosis not present

## 2023-10-31 DIAGNOSIS — H353231 Exudative age-related macular degeneration, bilateral, with active choroidal neovascularization: Secondary | ICD-10-CM | POA: Diagnosis not present

## 2023-10-31 NOTE — Progress Notes (Signed)
 Procedure Note:   NVAMD OU  Eylea  HD OU   Worse at 9 weeks - inf PED higher Keep at 8 weeks   RTC 8 weeks for OCT / inject only Eylea  HD OU RTC 16 weeks for OCT / inject only Eylea  HD OU RTC 24 weeks for exam / OCT / AF / probable Eylea  HD OU  I personally performed or jointly provided the services with an auxiliary provider Banker, Pensions consultant, etc.) and a clinical associate, but no participation of a resident.  Sierra Bentley

## 2023-11-01 DIAGNOSIS — R2681 Unsteadiness on feet: Secondary | ICD-10-CM | POA: Diagnosis not present

## 2023-11-03 DIAGNOSIS — R2689 Other abnormalities of gait and mobility: Secondary | ICD-10-CM | POA: Diagnosis not present

## 2023-11-03 DIAGNOSIS — R296 Repeated falls: Secondary | ICD-10-CM | POA: Diagnosis not present

## 2023-11-03 DIAGNOSIS — R2681 Unsteadiness on feet: Secondary | ICD-10-CM | POA: Diagnosis not present

## 2023-11-04 ENCOUNTER — Encounter: Payer: Self-pay | Admitting: Pulmonary Disease

## 2023-11-04 ENCOUNTER — Ambulatory Visit: Admitting: Pulmonary Disease

## 2023-11-04 VITALS — BP 120/70 | HR 78 | Temp 97.8°F | Ht 63.25 in | Wt 128.4 lb

## 2023-11-04 DIAGNOSIS — R0602 Shortness of breath: Secondary | ICD-10-CM | POA: Diagnosis not present

## 2023-11-04 MED ORDER — AIRSUPRA 90-80 MCG/ACT IN AERO: 2.0000 | INHALATION_SPRAY | Freq: Four times a day (QID) | RESPIRATORY_TRACT | 3 refills | Status: AC | PRN

## 2023-11-04 NOTE — Progress Notes (Signed)
 Synopsis: Referred in  by Randeen Laine LABOR, MD   Subjective:   PATIENT ID: Sierra Bentley GENDER: female DOB: 15-Aug-1933, MRN: [id number]  Chief Complaint  Patient presents with   Shortness of Breath    No SOB or wheezing. Cough with clear thick sputum. Not using Breo inhaler, it made her nose, mouth and tongue sore. She was rinsing her mouth out after using it.    HPI Sierra Bentley is an 89 years old female patient with a past medical hisoty of essential hypertension presenting as a referral from her PCP for further work up of shortness of breath.   She reports for the past 2 month she started having shortness of breath on exertion. Associated with cough that is mostly dry. She reports that the dyspnea is intermittent and not present all the tim. She denies any chest pain/tightness/wheezing. She denies any weight loss or loss of appetite. She reports hypersensitivity to humidity and strong scents. She had covid last year.   05/05/2023 visit: I started her on breo during the last visit but caused her to have mucosal irritation and therefore stopped it. She continues to have shortness of breath on exertion and graspy voice.   PFTs 04/2023 do not meet criteria for ATS but did show some significant reduction in FEV-1 and low normal DLCO.   FH - No family history of pulmonary diseases   SH - Never smoker - No alcohol use - No illicit drug use. Worked as a Runner, broadcasting/film/video. Has 5 dogs at home.   OV 11/04/2023 - Sierra Bentley is here to follow up on her shortness of breath and coughing spells. Her history was suggestive of reactive airway disease. She trialed wixela and Breo with no significant response and multiple adverse events mostly oral sores and hoarsenss of the voice. We discussed different approaches and agreed to start Airsupra as needed.   ROS All systems were reviewed and are negative except for the above.  Objective:   Vitals:   11/04/23 1141  BP: 120/70  Pulse: 78  Temp: 97.8 F (36.6  C)  SpO2: 97%  Weight: 128 lb 6.4 oz (58.2 kg)  Height: 5' 3.25 (1.607 m)   97% on RA BMI Readings from Last 3 Encounters:  11/04/23 22.57 kg/m  08/17/23 22.39 kg/m  08/15/23 22.32 kg/m   Wt Readings from Last 3 Encounters:  11/04/23 128 lb 6.4 oz (58.2 kg)  08/17/23 127 lb 6.4 oz (57.8 kg)  08/15/23 127 lb (57.6 kg)    Physical Exam GEN: NAD, Healthy Appearing HEENT: Supple Neck, Reactive Pupils, EOMI  CVS: Normal S1, Normal S2, RRR, No murmurs or ES appreciated  Lungs: Clear bilateral air entry.  Abdomen: Soft, non tender, non distended, + BS  Extremities: Warm and well perfused, No edema   Ancillary Information   CBC    Component Value Date/Time   WBC 4.7 04/19/2023 0841   RBC 4.59 04/19/2023 0841   HGB 12.9 04/19/2023 0841   HGB 12.1 03/18/2021 1213   HCT 39.2 04/19/2023 0841   PLT 210.0 04/19/2023 0841   PLT 244 03/18/2021 1213   MCV 85.4 04/19/2023 0841   MCH 27.9 03/18/2021 1213   MCHC 32.9 04/19/2023 0841   RDW 14.1 04/19/2023 0841   LYMPHSABS 1.3 04/19/2023 0841   MONOABS 0.4 04/19/2023 0841   EOSABS 0.1 04/19/2023 0841   BASOSABS 0.1 04/19/2023 0841    Imaging  CXR 08/2022 showed increased diffuse reticular opacities/Vascular prominence. With hyperinflation.  Latest Ref Rng & Units 04/28/2023    3:30 PM  PFT Results  FVC-Pre L 1.82   FVC-Predicted Pre % 79   FVC-Post L 1.70   FVC-Predicted Post % 74   Pre FEV1/FVC % % 57   Post FEV1/FCV % % 49   FEV1-Pre L 1.04   FEV1-Predicted Pre % 61   FEV1-Post L 0.84   DLCO uncorrected ml/min/mmHg 14.29   DLCO UNC% % 77   DLVA Predicted % 103   TLC L 3.85   TLC % Predicted % 76   RV % Predicted % 83      Assessment & Plan:  [name] is an 89 years old female patient with a past medical hisoty of essential hypertension presenting as a referral from her PCP for further work up of shortness of breath.   #Shorntness of breath likely in the setting of reactive airway disease/Adult onset asthma  post covid infection. However, she does have reticular opacities on her CXR and hyperinflation without a history of smoking. CT chest wo contrast 11/2022 withminimal chronic scarring from radiation therapy for breast cancer in the past.   She did have her vocal cords checked by ENT 2 years ago which appeared to be normal,   Ongoing shortness of breath , decreased FEV-1, I am suspecting post covid reactive airway disease.   []  Airsupra 2 puffs Q6H as needed   RTC 6 months.   I personally spent a total of 20 minutes in the care of the patient today including preparing to see the patient, getting/reviewing separately obtained history, performing a medically appropriate exam/evaluation, counseling and educating, placing orders, documenting clinical information in the EHR, and independently interpreting results.   Darrin Barn, MD [facility] 11/04/2023 12:10 PM

## 2023-11-08 DIAGNOSIS — R296 Repeated falls: Secondary | ICD-10-CM | POA: Diagnosis not present

## 2023-11-08 DIAGNOSIS — R2689 Other abnormalities of gait and mobility: Secondary | ICD-10-CM | POA: Diagnosis not present

## 2023-11-08 DIAGNOSIS — R2681 Unsteadiness on feet: Secondary | ICD-10-CM | POA: Diagnosis not present

## 2023-11-09 DIAGNOSIS — K219 Gastro-esophageal reflux disease without esophagitis: Secondary | ICD-10-CM | POA: Diagnosis not present

## 2023-11-09 DIAGNOSIS — R1310 Dysphagia, unspecified: Secondary | ICD-10-CM | POA: Diagnosis not present

## 2023-11-10 ENCOUNTER — Other Ambulatory Visit: Payer: Self-pay | Admitting: Nurse Practitioner

## 2023-11-10 DIAGNOSIS — K219 Gastro-esophageal reflux disease without esophagitis: Secondary | ICD-10-CM

## 2023-11-10 DIAGNOSIS — R1310 Dysphagia, unspecified: Secondary | ICD-10-CM

## 2023-11-10 DIAGNOSIS — R2681 Unsteadiness on feet: Secondary | ICD-10-CM | POA: Diagnosis not present

## 2023-11-15 DIAGNOSIS — R2689 Other abnormalities of gait and mobility: Secondary | ICD-10-CM | POA: Diagnosis not present

## 2023-11-15 DIAGNOSIS — R2681 Unsteadiness on feet: Secondary | ICD-10-CM | POA: Diagnosis not present

## 2023-11-15 DIAGNOSIS — R296 Repeated falls: Secondary | ICD-10-CM | POA: Diagnosis not present

## 2023-11-17 DIAGNOSIS — R2689 Other abnormalities of gait and mobility: Secondary | ICD-10-CM | POA: Diagnosis not present

## 2023-11-22 DIAGNOSIS — R2681 Unsteadiness on feet: Secondary | ICD-10-CM | POA: Diagnosis not present

## 2023-11-22 DIAGNOSIS — R296 Repeated falls: Secondary | ICD-10-CM | POA: Diagnosis not present

## 2023-11-22 DIAGNOSIS — R2689 Other abnormalities of gait and mobility: Secondary | ICD-10-CM | POA: Diagnosis not present

## 2023-11-24 DIAGNOSIS — R296 Repeated falls: Secondary | ICD-10-CM | POA: Diagnosis not present

## 2023-11-24 DIAGNOSIS — R2689 Other abnormalities of gait and mobility: Secondary | ICD-10-CM | POA: Diagnosis not present

## 2023-11-24 DIAGNOSIS — R2681 Unsteadiness on feet: Secondary | ICD-10-CM | POA: Diagnosis not present

## 2023-11-29 DIAGNOSIS — R2681 Unsteadiness on feet: Secondary | ICD-10-CM | POA: Diagnosis not present

## 2023-11-29 DIAGNOSIS — R2689 Other abnormalities of gait and mobility: Secondary | ICD-10-CM | POA: Diagnosis not present

## 2023-11-29 DIAGNOSIS — R296 Repeated falls: Secondary | ICD-10-CM | POA: Diagnosis not present

## 2023-11-30 DIAGNOSIS — R202 Paresthesia of skin: Secondary | ICD-10-CM | POA: Diagnosis not present

## 2023-11-30 DIAGNOSIS — R200 Anesthesia of skin: Secondary | ICD-10-CM | POA: Diagnosis not present

## 2023-11-30 DIAGNOSIS — R413 Other amnesia: Secondary | ICD-10-CM | POA: Diagnosis not present

## 2023-11-30 DIAGNOSIS — H547 Unspecified visual loss: Secondary | ICD-10-CM | POA: Diagnosis not present

## 2023-11-30 DIAGNOSIS — M79672 Pain in left foot: Secondary | ICD-10-CM | POA: Diagnosis not present

## 2023-11-30 DIAGNOSIS — R2689 Other abnormalities of gait and mobility: Secondary | ICD-10-CM | POA: Diagnosis not present

## 2023-12-01 DIAGNOSIS — R296 Repeated falls: Secondary | ICD-10-CM | POA: Diagnosis not present

## 2023-12-01 DIAGNOSIS — R2681 Unsteadiness on feet: Secondary | ICD-10-CM | POA: Diagnosis not present

## 2023-12-01 DIAGNOSIS — R2689 Other abnormalities of gait and mobility: Secondary | ICD-10-CM | POA: Diagnosis not present

## 2023-12-06 DIAGNOSIS — R296 Repeated falls: Secondary | ICD-10-CM | POA: Diagnosis not present

## 2023-12-06 DIAGNOSIS — R2689 Other abnormalities of gait and mobility: Secondary | ICD-10-CM | POA: Diagnosis not present

## 2023-12-08 DIAGNOSIS — R2689 Other abnormalities of gait and mobility: Secondary | ICD-10-CM | POA: Diagnosis not present

## 2023-12-08 DIAGNOSIS — R296 Repeated falls: Secondary | ICD-10-CM | POA: Diagnosis not present

## 2023-12-08 DIAGNOSIS — R2681 Unsteadiness on feet: Secondary | ICD-10-CM | POA: Diagnosis not present

## 2023-12-13 DIAGNOSIS — R2689 Other abnormalities of gait and mobility: Secondary | ICD-10-CM | POA: Diagnosis not present

## 2023-12-13 DIAGNOSIS — R296 Repeated falls: Secondary | ICD-10-CM | POA: Diagnosis not present

## 2023-12-13 DIAGNOSIS — R2681 Unsteadiness on feet: Secondary | ICD-10-CM | POA: Diagnosis not present

## 2023-12-15 DIAGNOSIS — R2681 Unsteadiness on feet: Secondary | ICD-10-CM | POA: Diagnosis not present

## 2023-12-15 DIAGNOSIS — R2689 Other abnormalities of gait and mobility: Secondary | ICD-10-CM | POA: Diagnosis not present

## 2023-12-15 DIAGNOSIS — R296 Repeated falls: Secondary | ICD-10-CM | POA: Diagnosis not present

## 2023-12-20 DIAGNOSIS — R2689 Other abnormalities of gait and mobility: Secondary | ICD-10-CM | POA: Diagnosis not present

## 2023-12-20 DIAGNOSIS — R296 Repeated falls: Secondary | ICD-10-CM | POA: Diagnosis not present

## 2023-12-26 DIAGNOSIS — H353231 Exudative age-related macular degeneration, bilateral, with active choroidal neovascularization: Secondary | ICD-10-CM | POA: Diagnosis not present

## 2024-01-03 ENCOUNTER — Encounter: Payer: Medicare PPO | Admitting: Nurse Practitioner

## 2024-01-05 ENCOUNTER — Other Ambulatory Visit

## 2024-01-12 ENCOUNTER — Inpatient Hospital Stay: Admission: RE | Admit: 2024-01-12

## 2024-02-20 ENCOUNTER — Ambulatory Visit

## 2024-02-20 VITALS — BP 120/70 | Ht 63.0 in | Wt 130.0 lb

## 2024-02-20 DIAGNOSIS — Z0000 Encounter for general adult medical examination without abnormal findings: Secondary | ICD-10-CM

## 2024-02-20 NOTE — Progress Notes (Signed)
 " I connected with  Sierra Bentley on 02/20/24 by a audio enabled telemedicine application and verified that I am speaking with the correct person using two identifiers.  Patient Location: Home  Provider Location: Home Office  Persons Participating in Visit: Patient.  I discussed the limitations of evaluation and management by telemedicine. The patient expressed understanding and agreed to proceed.  Vital Signs: Because this visit was a virtual/telehealth visit, some criteria may be missing or patient reported. Any vitals not documented were not able to be obtained and vitals that have been documented are patient reported.  Chief Complaint  Patient presents with   Medicare Wellness     Subjective:   Sierra Bentley is a 89 y.o. female who presents for a Medicare Annual Wellness Visit.  Visit info / Clinical Intake: Medicare Wellness Visit Type:: Subsequent Annual Wellness Visit Persons participating in visit and providing information:: patient Medicare Wellness Visit Mode:: Telephone If telephone:: video declined Since this visit was completed virtually, some vitals may be partially provided or unavailable. Missing vitals are due to the limitations of the virtual format.: Documented vitals are patient reported If Telephone or Video please confirm:: I connected with patient using audio/video enable telemedicine. I verified patient identity with two identifiers, discussed telehealth limitations, and patient agreed to proceed. Patient Location:: home Provider Location:: home office Interpreter Needed?: No Pre-visit prep was completed: yes AWV questionnaire completed by patient prior to visit?: no Living arrangements:: (!) lives alone Patient's Overall Health Status Rating: very good Typical amount of pain: some Does pain affect daily life?: no Are you currently prescribed opioids?: no  Dietary Habits and Nutritional Risks How many meals a day?: 3 Eats fruit and vegetables daily?:  yes Most meals are obtained by: eating out; preparing own meals In the last 2 weeks, have you had any of the following?: none Diabetic:: no  Functional Status Activities of Daily Living (to include ambulation/medication): Independent Ambulation: Independent with device- listed below Home Assistive Devices/Equipment: Cane Medication Administration: Independent Home Management (perform basic housework or laundry): Independent Manage your own finances?: yes Primary transportation is: driving Concerns about vision?: no *vision screening is required for WTM* Concerns about hearing?: (!) yes Uses hearing aids?: (!) yes Hear whispered voice?: (!) no *in-person visit only*  Fall Screening Falls in the past year?: 0 Number of falls in past year: 0 Was there an injury with Fall?: 0 Fall Risk Category Calculator: 0 Patient Fall Risk Level: Low Fall Risk  Fall Risk Patient at Risk for Falls Due to: Impaired mobility; Impaired balance/gait Fall risk Follow up: Falls evaluation completed  Home and Transportation Safety: All rugs have non-skid backing?: yes All stairs or steps have railings?: yes Grab bars in the bathtub or shower?: yes Have non-skid surface in bathtub or shower?: yes Good home lighting?: yes Regular seat belt use?: yes Hospital stays in the last year:: no  Cognitive Assessment Difficulty concentrating, remembering, or making decisions? : no Will 6CIT or Mini Cog be Completed: yes What year is it?: 0 points What month is it?: 0 points Give patient an address phrase to remember (5 components): remember word apple , table ,penny About what time is it?: 0 points Count backwards from 20 to 1: 0 points Say the months of the year in reverse: 0 points Repeat the address phrase from earlier: 0 points 6 CIT Score: 0 points  Advance Directives (For Healthcare) Does Patient Have a Medical Advance Directive?: Yes Does patient want to make changes to  medical advance  directive?: No - Patient declined Type of Advance Directive: Healthcare Power of [place]; Living will Copy of Healthcare Power of Attorney in Chart?: No - copy requested Copy of Living Will in Chart?: No - copy requested  Reviewed/Updated  Reviewed/Updated: Reviewed All (Medical, Surgical, Family, Medications, Allergies, Care Teams, Patient Goals)    Allergies (verified) Ace inhibitors, Boniva [ibandronate], Chocolate, Dapsone, Fosamax [alendronate], Oxycodone, and Plaquenil [hydroxychloroquine]   Current Medications (verified) Outpatient Encounter Medications as of 02/20/2024  Medication Sig   Albuterol -Budesonide (AIRSUPRA ) 90-80 MCG/ACT AERO Inhale 2 puffs into the lungs every 6 (six) hours as needed.   clobetasol  ointment (TEMOVATE ) 0.05 % Apply 1 Application topically 2 (two) times daily.   latanoprost (XALATAN) 0.005 % ophthalmic solution    losartan -hydrochlorothiazide  (HYZAAR) 50-12.5 MG tablet TAKE 1/2 TABLET BY MOUTH EVERY EVENING   mometasone  (ELOCON ) 0.1 % cream Apply topically 2 (two) times daily as needed.   Multiple Vitamin (MULTIVITAMIN) tablet Take 1 tablet by mouth daily. Per pt has Postmenopausal multivitamin packet with 5 pills   NON FORMULARY HD ? Injection for macular in eye   pantoprazole  (PROTONIX ) 20 MG tablet TAKE 1 TABLET BY MOUTH EVERY DAY   Polyethyl Glycol-Propyl Glycol (SYSTANE ULTRA OP) Place 1 drop into both eyes 4 (four) times daily.   Probiotic Product (ALIGN EXTRA STRENGTH PO) Take by mouth.   sodium chloride  (MURO 128) 5 % ophthalmic ointment Place 1 application  into both eyes at bedtime.  Uses Muro eye drops during the day as well   TURMERIC PO Take 1,300 mg by mouth in the morning and at bedtime.   No facility-administered encounter medications on file as of 02/20/2024.    History: Past Medical History:  Diagnosis Date   Allergic rhinitis, cause unspecified    Anemia    Breast cancer (HCC)    left breast IDC 2023   Chronically dry eyes,  bilateral    Diverticulosis of colon    Endometrial polyp    Family history of prostate cancer 03/18/2021   Fuchs' corneal dystrophy of right eye    GERD (gastroesophageal reflux disease)    Hearing loss of both ears    pt stated has hearing aids but does not wear   History of epistaxis    per pt has had several cautization's for nose bleed   History of radiation therapy    05/20/21-06/10/21- Dr. Lynwood Nasuti   HTN (hypertension)    Hyperlipidemia    Macular degeneration of both eyes    followed @[facility]  by dr shaunna. mettu;   both age-related and right is with active choroidal neovascularization   OA (osteoarthritis)    OAB (overactive bladder)    Osteopenia    Thickened endometrium    Urge incontinence of urine    Wears glasses    Past Surgical History:  Procedure Laterality Date   BREAST BIOPSY Left 2011   fibrocystic change   BREAST LUMPECTOMY WITH RADIOACTIVE SEED LOCALIZATION Left 04/13/2021   Procedure: LEFT BREAST LUMPECTOMY WITH RADIOACTIVE SEED LOCALIZATION;  Surgeon: Ebbie Cough, MD;  Location: [facility];  Service: General;  Laterality: Left;   CATARACT EXTRACTION W/ INTRAOCULAR LENS IMPLANT Bilateral 2008   COLONOSCOPY  2013   DILATATION & CURETTAGE/HYSTEROSCOPY WITH MYOSURE N/A 11/26/2020   Procedure: DILATATION & CURETTAGE/HYSTEROSCOPY WITH MYOSURE;  Surgeon: Lavoie, Marie-Lyne, MD;  Location: [facility];  Service: Gynecology;  Laterality: N/A;   TONSILLECTOMY  1962   Family History  Problem Relation Age of  Onset   Other Mother        Arrythmia   Coronary artery disease Mother    Hypertension Mother    Anxiety disorder Mother    Heart attack Father 68   Coronary artery disease Father    Prostate cancer Father        dx unknown age   Throat cancer Brother        d. 83   Cancer Other        two maternal female cousins; unknown cancer; dx after 54   Depression Other        family history   Social History   Occupational  History   Not on file  Tobacco Use   Smoking status: Never    Passive exposure: Never   Smokeless tobacco: Never  Vaping Use   Vaping status: Never Used  Substance and Sexual Activity   Alcohol use: No    Alcohol/week: 0.0 standard drinks of alcohol   Drug use: Never   Sexual activity: Not Currently    Partners: Male    Birth control/protection: Post-menopausal   Tobacco Counseling Counseling given: Not Answered  SDOH Screenings   Food Insecurity: No Food Insecurity (02/20/2024)  Housing: Low Risk (02/20/2024)  Transportation Needs: No Transportation Needs (02/20/2024)  Utilities: Not At Risk (02/20/2024)  Alcohol Screen: Low Risk (12/27/2022)  Depression (PHQ2-9): Low Risk (02/20/2024)  Financial Resource Strain: Low Risk (12/27/2022)  Physical Activity: Insufficiently Active (02/20/2024)  Social Connections: Moderately Isolated (02/20/2024)  Stress: No Stress Concern Present (02/20/2024)  Tobacco Use: Low Risk (02/20/2024)  Health Literacy: Adequate Health Literacy (02/20/2024)   See flowsheets for full screening details  Depression Screen PHQ 2 & 9 Depression Scale- Over the past 2 weeks, how often have you been bothered by any of the following problems? Little interest or pleasure in doing things: 0 Feeling down, depressed, or hopeless (PHQ Adolescent also includes...irritable): 0 PHQ-2 Total Score: 0 Trouble falling or staying asleep, or sleeping too much: 0 Feeling tired or having little energy: 0 Poor appetite or overeating (PHQ Adolescent also includes...weight loss): 0 Feeling bad about yourself - or that you are a failure or have let yourself or your family down: 0 Trouble concentrating on things, such as reading the newspaper or watching television (PHQ Adolescent also includes...like school work): 0 Moving or speaking so slowly that other people could have noticed. Or the opposite - being so fidgety or restless that you have been moving around a lot more than usual:  0 Thoughts that you would be better off dead, or of hurting yourself in some way: 0 PHQ-9 Total Score: 0 If you checked off any problems, how difficult have these problems made it for you to do your work, take care of things at home, or get along with other people?: Not difficult at all  Depression Treatment Depression Interventions/Treatment : [id number] Score <4 Follow-up Not Indicated     Goals Addressed             This Visit's Progress    Patient Stated   On track    Would like to drink more water             Objective:    Today's Vitals   02/20/24 1110  BP: 120/70  Weight: 130 lb (59 kg)  Height: 5' 3 (1.6 m)   Body mass index is 23.03 kg/m.  Hearing/Vision screen Hearing Screening - Comments:: Wears hearing aids Vision Screening - Comments:: Patient wears glasses  for driving Immunizations and Health Maintenance Health Maintenance  Topic Date Due   COVID-19 Vaccine (3 - Moderna risk series) 05/08/2019   Medicare Annual Wellness (AWV)  12/27/2023   DTaP/Tdap/Td (3 - Tdap) 05/18/2026   Pneumococcal Vaccine: 50+ Years  Completed   Influenza Vaccine  Completed   Bone Density Scan  Completed   Zoster Vaccines- Shingrix  Completed   Meningococcal B Vaccine  Aged Out   Mammogram  Discontinued        Assessment/Plan:  This is a routine wellness examination for Sierra Bentley.  Patient Care Team: Tower, Laine LABOR, MD as PCP - General Herminio Miu, MD as Referring Physician (Otolaryngology) Lesia Latisha Hauser, MD as Referring Physician (Ophthalmology) Ebbie Cough, MD as Consulting Physician (General Surgery) Odean Potts, MD as Consulting Physician (Hematology and Oncology) Shannon Agent, MD as Consulting Physician (Radiation Oncology) [facility]  I have personally reviewed and noted the following in the patients chart:   Medical and social history Use of alcohol, tobacco or illicit drugs  Current medications and supplements  including opioid prescriptions. Functional ability and status Nutritional status Physical activity Advanced directives List of other physicians Hospitalizations, surgeries, and ER visits in previous 12 months Vitals Screenings to include cognitive, depression, and falls Referrals and appointments  No orders of the defined types were placed in this encounter.  In addition, I have reviewed and discussed with patient certain preventive protocols, quality metrics, and best practice recommendations. A written personalized care plan for preventive services as well as general preventive health recommendations were provided to patient.   Sierra Bentley Right, [place]   02/20/2024   No follow-ups on file.  After Visit Summary: (MyChart) Due to this being a telephonic visit, the after visit summary with patients personalized plan was offered to patient via MyChart   Nurse Notes: no voiced concerns . Patient declined covid vaccine  "

## 2024-02-20 NOTE — Patient Instructions (Signed)
 Sierra Bentley,  Thank you for taking the time for your Medicare Wellness Visit. I appreciate your continued commitment to your health goals. Please review the care plan we discussed, and feel free to reach out if I can assist you further.  Please note that Annual Wellness Visits do not include a physical exam. Some assessments may be limited, especially if the visit was conducted virtually. If needed, we may recommend an in-person follow-up with your provider.  Ongoing Care Seeing your primary care provider every 3 to 6 months helps us  monitor your health and provide consistent, personalized care.   Referrals If a referral was made during today's visit and you haven't received any updates within two weeks, please contact the referred provider directly to check on the status.  Recommended Screenings:  Health Maintenance  Topic Date Due   COVID-19 Vaccine (3 - Moderna risk series) 05/08/2019   Medicare Annual Wellness Visit  12/27/2023   DTaP/Tdap/Td vaccine (3 - Tdap) 05/18/2026   Pneumococcal Vaccine for age over 45  Completed   Flu Shot  Completed   Osteoporosis screening with Bone Density Scan  Completed   Zoster (Shingles) Vaccine  Completed   Meningitis B Vaccine  Aged Out   Breast Cancer Screening  Discontinued       12/27/2022   11:15 AM  Advanced Directives  Does Patient Have a Medical Advance Directive? Yes  Type of Estate Agent of [place];Living will  Copy of Healthcare Power of Attorney in Chart? No - copy requested    Vision: Annual vision screenings are recommended for early detection of glaucoma, cataracts, and diabetic retinopathy. These exams can also reveal signs of chronic conditions such as diabetes and high blood pressure.  Dental: Annual dental screenings help detect early signs of oral cancer, gum disease, and other conditions linked to overall health, including heart disease and diabetes.  Please see the attached documents for additional  preventive care recommendations.

## 2024-02-21 ENCOUNTER — Ambulatory Visit: Payer: Medicare PPO | Admitting: Hematology and Oncology

## 2024-02-29 ENCOUNTER — Encounter: Payer: Self-pay | Admitting: Nurse Practitioner

## 2024-02-29 ENCOUNTER — Ambulatory Visit: Admitting: Nurse Practitioner

## 2024-02-29 VITALS — BP 100/64 | HR 83 | Ht 63.5 in | Wt 131.0 lb

## 2024-02-29 DIAGNOSIS — Z9289 Personal history of other medical treatment: Secondary | ICD-10-CM

## 2024-02-29 DIAGNOSIS — Z853 Personal history of malignant neoplasm of breast: Secondary | ICD-10-CM

## 2024-02-29 DIAGNOSIS — Z780 Asymptomatic menopausal state: Secondary | ICD-10-CM

## 2024-02-29 DIAGNOSIS — M810 Age-related osteoporosis without current pathological fracture: Secondary | ICD-10-CM

## 2024-02-29 DIAGNOSIS — Z01419 Encounter for gynecological examination (general) (routine) without abnormal findings: Secondary | ICD-10-CM

## 2024-02-29 NOTE — Progress Notes (Signed)
" ° °  Sierra Bentley 06/10/1933 [id number]   History:  89 y.o. G0 presents for breast and pelvic exam. Postmenopausal - no HRT, no bleeding. Normal pap history. 02/2021 left breast cancer (IDC) managed with lumpectomy. Osteoporosis, declines medication management. Diagnosed with an autoimmune skin disorder, uses OTC cream as other treatments not recommended based on history.   Gynecologic History No LMP recorded. Patient is postmenopausal.   Contraception: post menopausal status Sexually active: No  Health Maintenance Last Pap: 2016. Results were: Normal Last mammogram: 05/20/2023. Results were: Normal, surgical changes seen Last colonoscopy: 10/12/2011 Last Dexa: 02/03/2023. Results were: T-score -2.6 (-2.5 in 2022)  Past medical history, past surgical history, family history and social history were all reviewed and documented in the EPIC chart. Widowed. Husband passed January 2023.   ROS:  A ROS was performed and pertinent positives and negatives are included.  Exam:  Vitals:   02/29/24 1204  BP: 100/64  Pulse: 83  SpO2: 96%  Weight: 131 lb (59.4 kg)  Height: 5' 3.5 (1.613 m)    Body mass index is 22.84 kg/m. Physical Exam Exam conducted with a chaperone present.  Constitutional:      Appearance: Normal appearance.  Cardiovascular:     Rate and Rhythm: Normal rate and regular rhythm.  Pulmonary:     Effort: Pulmonary effort is normal.     Breath sounds: Normal breath sounds.  Chest:  Breasts:    Right: Normal.     Left: Normal.  Genitourinary:    General: Normal vulva.     Vagina: Normal.     Cervix: Normal.     Uterus: Normal.      Adnexa: Right adnexa normal and left adnexa normal.     Comments: Atrophic changes Skin:    Findings: Rash (To extensors, back of arms, thighs. Pink, raised.) present.     Sierra Bentley, CMA present as chaperone.  Assessment/Plan:  89 y.o. G0 for breast and pelvic exam.   Encounter for breast and pelvic examination - Education  provided on SBEs, importance of preventative screenings, current guidelines, high calcium diet, regular exercise, and multivitamin daily. Labs with PCP.   Postmenopausal - no HRT, no bleeding  History of left breast cancer - 02/2021 left breast cancer (IDC) managed with lumpectomy. UTD on screening.   Age-related osteoporosis without current pathological fracture - 01/2023 T-score -2.6 (-2.5 in 2022). Declines treatment. Discussed fall risk prevention and safety.   Screening for cervical cancer - Normal Pap history.  No longer screening per guidelines.   Screening for colon cancer - 2013 colonoscopy. No longer screening.   Return in about 1 year (around 02/28/2025) for B&P (high risk).   Sierra DELENA Shutter DNP, 1:36 PM 02/29/2024    "

## 2024-03-08 ENCOUNTER — Other Ambulatory Visit

## 2024-03-13 ENCOUNTER — Inpatient Hospital Stay: Admitting: Hematology and Oncology

## 2025-03-05 ENCOUNTER — Encounter: Admitting: Nurse Practitioner
